# Patient Record
Sex: Female | Born: 1940
Health system: Southern US, Community
[De-identification: ages and names within clinical notes are randomized; demographics above are authoritative.]

## PROBLEM LIST (undated history)

## (undated) DIAGNOSIS — E538 Deficiency of other specified B group vitamins: Secondary | ICD-10-CM

## (undated) DIAGNOSIS — N12 Tubulo-interstitial nephritis, not specified as acute or chronic: Secondary | ICD-10-CM

## (undated) DIAGNOSIS — D649 Anemia, unspecified: Secondary | ICD-10-CM

## (undated) DIAGNOSIS — I10 Essential (primary) hypertension: Secondary | ICD-10-CM

## (undated) DIAGNOSIS — Z800 Family history of malignant neoplasm of digestive organs: Secondary | ICD-10-CM

## (undated) DIAGNOSIS — R42 Dizziness and giddiness: Secondary | ICD-10-CM

## (undated) DIAGNOSIS — E039 Hypothyroidism, unspecified: Secondary | ICD-10-CM

## (undated) DIAGNOSIS — D446 Neoplasm of uncertain behavior of carotid body: Secondary | ICD-10-CM

## (undated) DIAGNOSIS — G4730 Sleep apnea, unspecified: Secondary | ICD-10-CM

## (undated) DIAGNOSIS — K625 Hemorrhage of anus and rectum: Secondary | ICD-10-CM

## (undated) HISTORY — PX: EYE SURGERY: SHX253

## (undated) HISTORY — DX: Hemorrhage of anus and rectum: K62.5

## (undated) HISTORY — DX: Family history of malignant neoplasm of digestive organs: Z80.0

## (undated) HISTORY — PX: VESICOVAGINAL FISTULA CLOSURE W/ TAH: SUR271

## (undated) HISTORY — PX: ABDOMINAL HYSTERECTOMY: SHX81

## (undated) HISTORY — PX: CATARACT EXTRACTION W/ INTRAOCULAR LENS  IMPLANT, BILATERAL: SHX1307

## (undated) HISTORY — DX: Tubulo-interstitial nephritis, not specified as acute or chronic: N12

## (undated) HISTORY — DX: Dizziness and giddiness: R42

---

## 1998-09-01 ENCOUNTER — Other Ambulatory Visit: Admission: RE | Admit: 1998-09-01 | Discharge: 1998-09-01 | Payer: Self-pay | Admitting: Gynecology

## 1999-08-02 ENCOUNTER — Encounter: Payer: Self-pay | Admitting: Emergency Medicine

## 1999-08-02 ENCOUNTER — Emergency Department (HOSPITAL_COMMUNITY): Admission: EM | Admit: 1999-08-02 | Discharge: 1999-08-02 | Payer: Self-pay | Admitting: Emergency Medicine

## 1999-09-23 ENCOUNTER — Other Ambulatory Visit: Admission: RE | Admit: 1999-09-23 | Discharge: 1999-09-23 | Payer: Self-pay | Admitting: Gynecology

## 2000-10-02 ENCOUNTER — Encounter: Admission: RE | Admit: 2000-10-02 | Discharge: 2000-10-02 | Payer: Self-pay | Admitting: Gynecology

## 2000-10-02 ENCOUNTER — Other Ambulatory Visit: Admission: RE | Admit: 2000-10-02 | Discharge: 2000-10-02 | Payer: Self-pay | Admitting: Gynecology

## 2000-10-02 ENCOUNTER — Encounter: Payer: Self-pay | Admitting: Gynecology

## 2001-10-15 ENCOUNTER — Other Ambulatory Visit: Admission: RE | Admit: 2001-10-15 | Discharge: 2001-10-15 | Payer: Self-pay | Admitting: Gynecology

## 2001-10-15 ENCOUNTER — Encounter: Admission: RE | Admit: 2001-10-15 | Discharge: 2001-10-15 | Payer: Self-pay | Admitting: Gynecology

## 2001-10-15 ENCOUNTER — Encounter: Payer: Self-pay | Admitting: Gynecology

## 2002-10-11 ENCOUNTER — Other Ambulatory Visit: Admission: RE | Admit: 2002-10-11 | Discharge: 2002-10-11 | Payer: Self-pay | Admitting: Gynecology

## 2002-10-23 ENCOUNTER — Encounter: Payer: Self-pay | Admitting: Gynecology

## 2002-10-23 ENCOUNTER — Encounter: Admission: RE | Admit: 2002-10-23 | Discharge: 2002-10-23 | Payer: Self-pay | Admitting: Gynecology

## 2003-10-03 ENCOUNTER — Other Ambulatory Visit: Admission: RE | Admit: 2003-10-03 | Discharge: 2003-10-03 | Payer: Self-pay | Admitting: Gynecology

## 2004-10-13 ENCOUNTER — Other Ambulatory Visit: Admission: RE | Admit: 2004-10-13 | Discharge: 2004-10-13 | Payer: Self-pay | Admitting: Gynecology

## 2004-10-19 ENCOUNTER — Ambulatory Visit (HOSPITAL_COMMUNITY): Admission: RE | Admit: 2004-10-19 | Discharge: 2004-10-19 | Payer: Self-pay | Admitting: Gynecology

## 2005-10-24 ENCOUNTER — Other Ambulatory Visit: Admission: RE | Admit: 2005-10-24 | Discharge: 2005-10-24 | Payer: Self-pay | Admitting: Gynecology

## 2006-01-12 ENCOUNTER — Ambulatory Visit (HOSPITAL_COMMUNITY): Admission: RE | Admit: 2006-01-12 | Discharge: 2006-01-12 | Payer: Self-pay | Admitting: Gynecology

## 2006-10-26 ENCOUNTER — Other Ambulatory Visit: Admission: RE | Admit: 2006-10-26 | Discharge: 2006-10-26 | Payer: Self-pay | Admitting: Gynecology

## 2007-02-01 ENCOUNTER — Ambulatory Visit (HOSPITAL_COMMUNITY): Admission: RE | Admit: 2007-02-01 | Discharge: 2007-02-01 | Payer: Self-pay | Admitting: Gynecology

## 2008-02-06 ENCOUNTER — Ambulatory Visit (HOSPITAL_COMMUNITY): Admission: RE | Admit: 2008-02-06 | Discharge: 2008-02-06 | Payer: Self-pay | Admitting: Gynecology

## 2009-02-09 ENCOUNTER — Ambulatory Visit (HOSPITAL_COMMUNITY): Admission: RE | Admit: 2009-02-09 | Discharge: 2009-02-09 | Payer: Self-pay | Admitting: Family Medicine

## 2009-04-25 ENCOUNTER — Emergency Department (HOSPITAL_COMMUNITY): Admission: EM | Admit: 2009-04-25 | Discharge: 2009-04-25 | Payer: Self-pay | Admitting: Emergency Medicine

## 2010-02-22 ENCOUNTER — Ambulatory Visit (HOSPITAL_COMMUNITY): Admission: RE | Admit: 2010-02-22 | Discharge: 2010-02-22 | Payer: Self-pay | Admitting: Family Medicine

## 2010-03-20 ENCOUNTER — Inpatient Hospital Stay (HOSPITAL_COMMUNITY): Admission: EM | Admit: 2010-03-20 | Discharge: 2010-03-22 | Payer: Self-pay | Admitting: Emergency Medicine

## 2010-09-19 HISTORY — PX: COLONOSCOPY: SHX174

## 2010-12-05 LAB — URINALYSIS, ROUTINE W REFLEX MICROSCOPIC
Bilirubin Urine: NEGATIVE
Glucose, UA: NEGATIVE mg/dL
Hgb urine dipstick: NEGATIVE
Nitrite: NEGATIVE
Protein, ur: NEGATIVE mg/dL
Specific Gravity, Urine: 1.01 (ref 1.005–1.030)
Urobilinogen, UA: 0.2 mg/dL (ref 0.0–1.0)

## 2010-12-05 LAB — BASIC METABOLIC PANEL
BUN: 10 mg/dL (ref 6–23)
BUN: 12 mg/dL (ref 6–23)
Calcium: 8.5 mg/dL (ref 8.4–10.5)
GFR calc Af Amer: >60 mL/min (ref >60)
GFR calc non Af Amer: 48 mL/min — ABNORMAL LOW (ref >60)
GFR calc non Af Amer: 51 mL/min — ABNORMAL LOW (ref >60)
Glucose, Bld: 92 mg/dL (ref 70–99)
Potassium: 3.1 mEq/L — ABNORMAL LOW (ref 3.5–5.1)
Potassium: 3.4 mEq/L — ABNORMAL LOW (ref 3.5–5.1)

## 2010-12-05 LAB — DIFFERENTIAL
Basophils Absolute: 0 10*3/uL (ref 0.0–0.1)
Basophils Relative: 0 % (ref 0–1)
Eosinophils Absolute: 0.1 10*3/uL (ref 0.0–0.7)
Eosinophils Relative: 1 % (ref 0–5)
Neutrophils Relative %: 66 % (ref 43–77)

## 2010-12-05 LAB — CBC
HCT: 40.9 % (ref 36.0–46.0)
Platelets: 259 10*3/uL (ref 150–400)
WBC: 5.7 10*3/uL (ref 4.0–10.5)

## 2010-12-05 LAB — POCT CARDIAC MARKERS
CKMB, poc: <1 ng/mL — ABNORMAL LOW (ref 1.0–8.0)
Myoglobin, poc: 69.4 ng/mL (ref 12–200)

## 2010-12-26 LAB — URINALYSIS, ROUTINE W REFLEX MICROSCOPIC
Ketones, ur: 15 mg/dL — AB
Nitrite: POSITIVE — AB
Urobilinogen, UA: 4 mg/dL — ABNORMAL HIGH (ref 0.0–1.0)
pH: 6 (ref 5.0–8.0)

## 2010-12-26 LAB — POCT URINALYSIS DIP (DEVICE)
Glucose, UA: NEGATIVE mg/dL
pH: 6 (ref 5.0–8.0)

## 2010-12-26 LAB — URINE MICROSCOPIC-ADD ON

## 2010-12-26 LAB — COMPREHENSIVE METABOLIC PANEL
Alkaline Phosphatase: 76 U/L (ref 39–117)
Chloride: 99 mEq/L (ref 96–112)
Glucose, Bld: 94 mg/dL (ref 70–99)
Potassium: 2.8 mEq/L — ABNORMAL LOW (ref 3.5–5.1)
Sodium: 140 mEq/L (ref 135–145)
Total Bilirubin: 1.3 mg/dL — ABNORMAL HIGH (ref 0.3–1.2)
Total Protein: 7.6 g/dL (ref 6.0–8.3)

## 2010-12-26 LAB — CBC
MCHC: 33.8 g/dL (ref 30.0–36.0)
MCV: 87.3 fL (ref 78.0–100.0)
RBC: 4.46 MIL/uL (ref 3.87–5.11)
RDW: 14.5 % (ref 11.5–15.5)

## 2010-12-26 LAB — URINE CULTURE: Colony Count: >=100000

## 2010-12-26 LAB — DIFFERENTIAL
Basophils Absolute: 0 10*3/uL (ref 0.0–0.1)
Lymphs Abs: 0.5 10*3/uL — ABNORMAL LOW (ref 0.7–4.0)

## 2011-02-07 ENCOUNTER — Other Ambulatory Visit (HOSPITAL_COMMUNITY): Payer: Self-pay | Admitting: Gynecology

## 2011-02-07 DIAGNOSIS — Z1231 Encounter for screening mammogram for malignant neoplasm of breast: Secondary | ICD-10-CM

## 2011-02-25 ENCOUNTER — Ambulatory Visit (HOSPITAL_COMMUNITY)
Admission: RE | Admit: 2011-02-25 | Discharge: 2011-02-25 | Disposition: A | Discharge status: Home / Self Care | Payer: Medicare Other | Source: Ambulatory Visit | Attending: Gynecology | Admitting: Gynecology

## 2011-02-25 DIAGNOSIS — Z1231 Encounter for screening mammogram for malignant neoplasm of breast: Secondary | ICD-10-CM

## 2011-06-14 ENCOUNTER — Other Ambulatory Visit: Payer: Self-pay | Admitting: Internal Medicine

## 2011-06-14 ENCOUNTER — Ambulatory Visit
Admission: RE | Admit: 2011-06-14 | Discharge: 2011-06-14 | Disposition: A | Discharge status: Home / Self Care | Payer: Medicare Other | Source: Ambulatory Visit | Attending: Internal Medicine | Admitting: Internal Medicine

## 2011-06-14 DIAGNOSIS — R062 Wheezing: Secondary | ICD-10-CM

## 2011-09-26 DIAGNOSIS — N390 Urinary tract infection, site not specified: Secondary | ICD-10-CM | POA: Diagnosis not present

## 2011-11-05 DIAGNOSIS — H1030 Unspecified acute conjunctivitis, unspecified eye: Secondary | ICD-10-CM | POA: Diagnosis not present

## 2011-11-25 DIAGNOSIS — H04129 Dry eye syndrome of unspecified lacrimal gland: Secondary | ICD-10-CM | POA: Diagnosis not present

## 2011-11-25 DIAGNOSIS — H2510 Age-related nuclear cataract, unspecified eye: Secondary | ICD-10-CM | POA: Diagnosis not present

## 2012-01-21 DIAGNOSIS — S92919A Unspecified fracture of unspecified toe(s), initial encounter for closed fracture: Secondary | ICD-10-CM | POA: Diagnosis not present

## 2012-02-03 DIAGNOSIS — M949 Disorder of cartilage, unspecified: Secondary | ICD-10-CM | POA: Diagnosis not present

## 2012-02-03 DIAGNOSIS — M899 Disorder of bone, unspecified: Secondary | ICD-10-CM | POA: Diagnosis not present

## 2012-02-03 DIAGNOSIS — I10 Essential (primary) hypertension: Secondary | ICD-10-CM | POA: Diagnosis not present

## 2012-02-03 DIAGNOSIS — E039 Hypothyroidism, unspecified: Secondary | ICD-10-CM | POA: Diagnosis not present

## 2012-02-03 DIAGNOSIS — N8184 Pelvic muscle wasting: Secondary | ICD-10-CM | POA: Diagnosis not present

## 2012-04-06 ENCOUNTER — Other Ambulatory Visit (HOSPITAL_COMMUNITY): Payer: Self-pay | Admitting: Gynecology

## 2012-04-06 DIAGNOSIS — Z1231 Encounter for screening mammogram for malignant neoplasm of breast: Secondary | ICD-10-CM

## 2012-04-27 ENCOUNTER — Ambulatory Visit (HOSPITAL_COMMUNITY): Payer: Medicare Other

## 2012-05-04 ENCOUNTER — Ambulatory Visit (HOSPITAL_COMMUNITY)
Admission: RE | Admit: 2012-05-04 | Discharge: 2012-05-04 | Disposition: A | Discharge status: Home / Self Care | Payer: Medicare Other | Source: Ambulatory Visit | Attending: Gynecology | Admitting: Gynecology

## 2012-05-04 DIAGNOSIS — Z1231 Encounter for screening mammogram for malignant neoplasm of breast: Secondary | ICD-10-CM | POA: Diagnosis not present

## 2012-06-01 DIAGNOSIS — H2510 Age-related nuclear cataract, unspecified eye: Secondary | ICD-10-CM | POA: Diagnosis not present

## 2012-06-01 DIAGNOSIS — H3560 Retinal hemorrhage, unspecified eye: Secondary | ICD-10-CM | POA: Diagnosis not present

## 2012-06-06 DIAGNOSIS — I10 Essential (primary) hypertension: Secondary | ICD-10-CM | POA: Diagnosis not present

## 2012-07-10 DIAGNOSIS — Z23 Encounter for immunization: Secondary | ICD-10-CM | POA: Diagnosis not present

## 2012-07-13 DIAGNOSIS — E039 Hypothyroidism, unspecified: Secondary | ICD-10-CM | POA: Diagnosis not present

## 2012-07-13 DIAGNOSIS — I10 Essential (primary) hypertension: Secondary | ICD-10-CM | POA: Diagnosis not present

## 2012-07-13 DIAGNOSIS — E781 Pure hyperglyceridemia: Secondary | ICD-10-CM | POA: Diagnosis not present

## 2012-07-31 ENCOUNTER — Ambulatory Visit
Admission: RE | Admit: 2012-07-31 | Discharge: 2012-07-31 | Disposition: A | Discharge status: Home / Self Care | Payer: Medicare Other | Source: Ambulatory Visit | Attending: Nurse Practitioner | Admitting: Nurse Practitioner

## 2012-07-31 ENCOUNTER — Other Ambulatory Visit: Payer: Self-pay | Admitting: Nurse Practitioner

## 2012-07-31 DIAGNOSIS — M25569 Pain in unspecified knee: Secondary | ICD-10-CM | POA: Diagnosis not present

## 2012-07-31 DIAGNOSIS — M1710 Unilateral primary osteoarthritis, unspecified knee: Secondary | ICD-10-CM | POA: Diagnosis not present

## 2012-07-31 DIAGNOSIS — M25561 Pain in right knee: Secondary | ICD-10-CM

## 2012-07-31 DIAGNOSIS — R609 Edema, unspecified: Secondary | ICD-10-CM

## 2012-09-19 HISTORY — PX: UPPER GASTROINTESTINAL ENDOSCOPY: SHX188

## 2013-01-11 DIAGNOSIS — J45909 Unspecified asthma, uncomplicated: Secondary | ICD-10-CM | POA: Diagnosis not present

## 2013-02-01 ENCOUNTER — Emergency Department (HOSPITAL_COMMUNITY): Payer: Medicare Other

## 2013-02-01 ENCOUNTER — Encounter (HOSPITAL_COMMUNITY): Payer: Self-pay | Admitting: *Deleted

## 2013-02-01 ENCOUNTER — Inpatient Hospital Stay (HOSPITAL_COMMUNITY)
Admission: EM | Admit: 2013-02-01 | Discharge: 2013-02-05 | DRG: 690 | Disposition: A | Discharge status: Home / Self Care | Payer: Medicare Other | Attending: Pulmonary Disease | Admitting: Pulmonary Disease

## 2013-02-01 DIAGNOSIS — R059 Cough, unspecified: Secondary | ICD-10-CM | POA: Diagnosis not present

## 2013-02-01 DIAGNOSIS — E876 Hypokalemia: Secondary | ICD-10-CM | POA: Diagnosis present

## 2013-02-01 DIAGNOSIS — I10 Essential (primary) hypertension: Secondary | ICD-10-CM | POA: Diagnosis not present

## 2013-02-01 DIAGNOSIS — D509 Iron deficiency anemia, unspecified: Secondary | ICD-10-CM | POA: Diagnosis present

## 2013-02-01 DIAGNOSIS — E860 Dehydration: Secondary | ICD-10-CM

## 2013-02-01 DIAGNOSIS — D649 Anemia, unspecified: Secondary | ICD-10-CM | POA: Diagnosis not present

## 2013-02-01 DIAGNOSIS — R509 Fever, unspecified: Secondary | ICD-10-CM | POA: Diagnosis not present

## 2013-02-01 DIAGNOSIS — A498 Other bacterial infections of unspecified site: Secondary | ICD-10-CM | POA: Diagnosis present

## 2013-02-01 DIAGNOSIS — D631 Anemia in chronic kidney disease: Secondary | ICD-10-CM | POA: Diagnosis present

## 2013-02-01 DIAGNOSIS — E538 Deficiency of other specified B group vitamins: Secondary | ICD-10-CM | POA: Diagnosis present

## 2013-02-01 DIAGNOSIS — K449 Diaphragmatic hernia without obstruction or gangrene: Secondary | ICD-10-CM | POA: Diagnosis not present

## 2013-02-01 DIAGNOSIS — E039 Hypothyroidism, unspecified: Secondary | ICD-10-CM

## 2013-02-01 DIAGNOSIS — N318 Other neuromuscular dysfunction of bladder: Secondary | ICD-10-CM | POA: Diagnosis present

## 2013-02-01 DIAGNOSIS — N12 Tubulo-interstitial nephritis, not specified as acute or chronic: Principal | ICD-10-CM

## 2013-02-01 DIAGNOSIS — N10 Acute tubulo-interstitial nephritis: Secondary | ICD-10-CM | POA: Diagnosis not present

## 2013-02-01 DIAGNOSIS — R05 Cough: Secondary | ICD-10-CM | POA: Diagnosis not present

## 2013-02-01 DIAGNOSIS — K7689 Other specified diseases of liver: Secondary | ICD-10-CM | POA: Diagnosis not present

## 2013-02-01 DIAGNOSIS — N189 Chronic kidney disease, unspecified: Secondary | ICD-10-CM | POA: Diagnosis present

## 2013-02-01 DIAGNOSIS — R112 Nausea with vomiting, unspecified: Secondary | ICD-10-CM | POA: Diagnosis not present

## 2013-02-01 HISTORY — DX: Essential (primary) hypertension: I10

## 2013-02-01 HISTORY — DX: Hypothyroidism, unspecified: E03.9

## 2013-02-01 LAB — URINALYSIS, ROUTINE W REFLEX MICROSCOPIC
Glucose, UA: NEGATIVE mg/dL
Ketones, ur: 40 mg/dL — AB
Nitrite: POSITIVE — AB
Protein, ur: 30 mg/dL — AB
pH: 6 (ref 5.0–8.0)

## 2013-02-01 LAB — CBC WITH DIFFERENTIAL/PLATELET
Basophils Absolute: 0 10*3/uL (ref 0.0–0.1)
Eosinophils Absolute: 0 10*3/uL (ref 0.0–0.7)
Eosinophils Relative: 0 % (ref 0–5)
Lymphocytes Relative: 6 % — ABNORMAL LOW (ref 12–46)
Lymphs Abs: 0.6 10*3/uL — ABNORMAL LOW (ref 0.7–4.0)
MCV: 90.1 fL (ref 78.0–100.0)
Neutrophils Relative %: 90 % — ABNORMAL HIGH (ref 43–77)
Platelets: 217 10*3/uL (ref 150–400)
RBC: 3.85 MIL/uL — ABNORMAL LOW (ref 3.87–5.11)
RDW: 14.7 % (ref 11.5–15.5)
WBC: 9.3 10*3/uL (ref 4.0–10.5)

## 2013-02-01 LAB — LACTIC ACID, PLASMA: Lactic Acid, Venous: 1.8 mmol/L (ref 0.5–2.2)

## 2013-02-01 LAB — COMPREHENSIVE METABOLIC PANEL
Alkaline Phosphatase: 73 U/L (ref 39–117)
BUN: 13 mg/dL (ref 6–23)
CO2: 24 mEq/L (ref 19–32)
Chloride: 95 mEq/L — ABNORMAL LOW (ref 96–112)
Creatinine, Ser: 1.13 mg/dL — ABNORMAL HIGH (ref 0.50–1.10)
GFR calc non Af Amer: 47 mL/min — ABNORMAL LOW (ref >90)
Glucose, Bld: 140 mg/dL — ABNORMAL HIGH (ref 70–99)
Potassium: 3.3 mEq/L — ABNORMAL LOW (ref 3.5–5.1)
Total Bilirubin: 1.1 mg/dL (ref 0.3–1.2)

## 2013-02-01 LAB — URINE MICROSCOPIC-ADD ON

## 2013-02-01 MED ORDER — OLMESARTAN MEDOXOMIL-HCTZ 40-12.5 MG PO TABS: 1.0000 | ORAL_TABLET | Freq: Every day | ORAL | Status: DC

## 2013-02-01 MED ORDER — POTASSIUM CHLORIDE IN NACL 20-0.9 MEQ/L-% IV SOLN
INTRAVENOUS | Status: DC
  Administered 2013-02-02 – 2013-02-03 (×4): via INTRAVENOUS

## 2013-02-01 MED ORDER — SODIUM CHLORIDE 0.9 % IV SOLN
INTRAVENOUS | Status: AC
  Administered 2013-02-01: 23:00:00 via INTRAVENOUS

## 2013-02-01 MED ORDER — LEVOTHYROXINE SODIUM 75 MCG PO TABS
75.0000 ug | ORAL_TABLET | Freq: Every day | ORAL | Status: DC
  Administered 2013-02-02 – 2013-02-05 (×4): 75 ug via ORAL
  Filled 2013-02-01 (×5): qty 1

## 2013-02-01 MED ORDER — IOHEXOL 300 MG/ML  SOLN
100.0000 mL | Freq: Once | INTRAMUSCULAR | Status: AC | PRN
  Administered 2013-02-01: 100 mL via INTRAVENOUS

## 2013-02-01 MED ORDER — SODIUM CHLORIDE 0.9 % IV BOLUS (SEPSIS)
1000.0000 mL | Freq: Once | INTRAVENOUS | Status: AC
  Administered 2013-02-01: 1000 mL via INTRAVENOUS
  Filled 2013-02-01: qty 1000

## 2013-02-01 MED ORDER — FLEET ENEMA 7-19 GM/118ML RE ENEM: 1.0000 | ENEMA | Freq: Once | RECTAL | Status: AC | PRN

## 2013-02-01 MED ORDER — KETOROLAC TROMETHAMINE 30 MG/ML IJ SOLN
30.0000 mg | Freq: Once | INTRAMUSCULAR | Status: DC
  Filled 2013-02-01: qty 1

## 2013-02-01 MED ORDER — ONDANSETRON HCL 4 MG/2ML IJ SOLN
4.0000 mg | Freq: Once | INTRAMUSCULAR | Status: AC
  Administered 2013-02-01: 4 mg via INTRAVENOUS
  Filled 2013-02-01: qty 2

## 2013-02-01 MED ORDER — IOHEXOL 300 MG/ML  SOLN
50.0000 mL | Freq: Once | INTRAMUSCULAR | Status: AC | PRN
  Administered 2013-02-01: 50 mL via ORAL

## 2013-02-01 MED ORDER — ACETAMINOPHEN 10 MG/ML IV SOLN
1000.0000 mg | Freq: Four times a day (QID) | INTRAVENOUS | Status: DC
  Administered 2013-02-01: 1000 mg via INTRAVENOUS
  Filled 2013-02-01 (×4): qty 100

## 2013-02-01 MED ORDER — SODIUM CHLORIDE 0.9 % IV BOLUS (SEPSIS)
1000.0000 mL | Freq: Once | INTRAVENOUS | Status: AC
  Administered 2013-02-01: 1000 mL via INTRAVENOUS

## 2013-02-01 MED ORDER — HYDROMORPHONE HCL PF 1 MG/ML IJ SOLN
0.5000 mg | INTRAMUSCULAR | Status: DC | PRN
  Filled 2013-02-01: qty 1

## 2013-02-01 MED ORDER — IRBESARTAN 300 MG PO TABS
300.0000 mg | ORAL_TABLET | Freq: Every day | ORAL | Status: DC
  Administered 2013-02-02 – 2013-02-04 (×3): 300 mg via ORAL
  Filled 2013-02-01 (×3): qty 1

## 2013-02-01 MED ORDER — ACETAMINOPHEN 325 MG PO TABS
ORAL_TABLET | ORAL | Status: AC
  Administered 2013-02-01: 650 mg
  Filled 2013-02-01: qty 2

## 2013-02-01 MED ORDER — ESTRADIOL 0.0375 MG/24HR TD PTWK
0.0375 mg | MEDICATED_PATCH | TRANSDERMAL | Status: DC
  Filled 2013-02-01: qty 1

## 2013-02-01 MED ORDER — DEXTROSE 5 % IV SOLN
1.0000 g | INTRAVENOUS | Status: DC
  Administered 2013-02-02 – 2013-02-04 (×3): 1 g via INTRAVENOUS
  Filled 2013-02-01 (×4): qty 10

## 2013-02-01 MED ORDER — KETOROLAC TROMETHAMINE 30 MG/ML IJ SOLN
30.0000 mg | Freq: Once | INTRAMUSCULAR | Status: AC
  Administered 2013-02-01: 30 mg via INTRAVENOUS

## 2013-02-01 MED ORDER — POLYETHYLENE GLYCOL 3350 17 G PO PACK
17.0000 g | PACK | Freq: Every day | ORAL | Status: DC | PRN
  Filled 2013-02-01: qty 1

## 2013-02-01 MED ORDER — TRAZODONE HCL 50 MG PO TABS
25.0000 mg | ORAL_TABLET | Freq: Every evening | ORAL | Status: DC | PRN
  Administered 2013-02-03 – 2013-02-04 (×2): 25 mg via ORAL
  Filled 2013-02-01 (×2): qty 1

## 2013-02-01 MED ORDER — ACETAMINOPHEN 325 MG PO TABS
650.0000 mg | ORAL_TABLET | ORAL | Status: DC | PRN
  Administered 2013-02-02: 650 mg via ORAL
  Filled 2013-02-01 (×2): qty 2

## 2013-02-01 MED ORDER — FENTANYL CITRATE 0.05 MG/ML IJ SOLN
50.0000 ug | Freq: Once | INTRAMUSCULAR | Status: AC
  Administered 2013-02-01: 50 ug via INTRAVENOUS
  Filled 2013-02-01: qty 2

## 2013-02-01 MED ORDER — DEXTROSE 5 % IV SOLN
1.0000 g | Freq: Once | INTRAVENOUS | Status: AC
  Administered 2013-02-01: 1 g via INTRAVENOUS
  Filled 2013-02-01: qty 10

## 2013-02-01 MED ORDER — ASPIRIN EC 81 MG PO TBEC
81.0000 mg | DELAYED_RELEASE_TABLET | Freq: Every day | ORAL | Status: DC
  Administered 2013-02-02 – 2013-02-04 (×3): 81 mg via ORAL
  Filled 2013-02-01 (×3): qty 1

## 2013-02-01 MED ORDER — ONDANSETRON HCL 4 MG/2ML IJ SOLN
4.0000 mg | INTRAMUSCULAR | Status: DC | PRN
  Administered 2013-02-02: 4 mg via INTRAVENOUS
  Filled 2013-02-01: qty 2

## 2013-02-01 MED ORDER — HYDROCHLOROTHIAZIDE 12.5 MG PO CAPS
12.5000 mg | ORAL_CAPSULE | Freq: Every day | ORAL | Status: DC
  Administered 2013-02-02 – 2013-02-04 (×3): 12.5 mg via ORAL
  Filled 2013-02-01 (×3): qty 1

## 2013-02-01 MED ORDER — ENOXAPARIN SODIUM 30 MG/0.3ML ~~LOC~~ SOLN
30.0000 mg | SUBCUTANEOUS | Status: DC
  Administered 2013-02-02 – 2013-02-04 (×3): 30 mg via SUBCUTANEOUS
  Filled 2013-02-01 (×3): qty 0.3

## 2013-02-01 NOTE — ED Notes (Addendum)
Pt temp unchaged to this point, will notifty EDP, pt is aox4, states she feels better. No other complaints. Cold salene still in process, temp in room is down and pt is uncovered.

## 2013-02-01 NOTE — ED Notes (Signed)
abd pain, back pain, headache, fever,vomiting , diarrhea.

## 2013-02-01 NOTE — ED Provider Notes (Addendum)
History    This chart was scribed for Rawn Quiroa B. Bernette Mayers, MD by Toya Smothers, ED Scribe. The patient was seen in room APA01/APA01. Patient's care was started at 1205.    CSN: [id number]  Arrival date & time 02/01/13  1205   None     Chief Complaint  Patient presents with  . Abdominal Pain    The history is provided by the patient. No language interpreter was used.    HPI Comments: Heather Cobb is a 72 y.o. female who presents to the Emergency Department complaining of 2 days of progressive lower abdominal and back pain. Pain is moderate and worse on the right. There are no alleviating or aggravating factors. Pt endorses associated moderate nausea, emesis, diarrhea, mild headache, and subjective fever. Symptoms have not been treated PTA. Pt denies diaphoresis, chills, weakness, cough, SOB and any other pain. Pt denies use of tobacco, alcohol, and illicit drug use.  PCP: Dr Juanetta Gosling      History reviewed. No pertinent past medical history.  Past Surgical History  Procedure Laterality Date  . Abdominal hysterectomy      History reviewed. No pertinent family history.  History  Substance Use Topics  . Smoking status: Never Smoker   . Smokeless tobacco: Not on file  . Alcohol Use: No   Review of Systems  Constitutional: Positive for fever.  Gastrointestinal: Positive for nausea, vomiting, abdominal pain and diarrhea.  Musculoskeletal: Positive for back pain.  Neurological: Positive for headaches.  All other systems reviewed and are negative.    Allergies  Codeine  Home Medications   Current Outpatient Rx  Name  Route  Sig  Dispense  Refill  . aspirin EC 81 MG tablet   Oral   Take 81 mg by mouth daily.         . cholecalciferol (VITAMIN D) 1000 UNITS tablet   Oral   Take 1,000 Units by mouth daily.         Marland Kitchen estradiol (CLIMARA - DOSED IN MG/24 HR) 0.0375 mg/24hr   Transdermal   Place 1 patch onto the skin once a week.         . levothyroxine  (SYNTHROID, LEVOTHROID) 75 MCG tablet   Oral   Take 75 mcg by mouth daily.         Marland Kitchen olmesartan-hydrochlorothiazide (BENICAR HCT) 40-12.5 MG per tablet   Oral   Take 1 tablet by mouth daily.           BP 120/71  Pulse 110  Temp(Src) 101 F (38.3 C) (Oral)  Resp 20  Ht 5\' 4"  (1.626 m)  Wt 161 lb (73.029 kg)  BMI 27.62 kg/m2  SpO2 98%  Physical Exam  Nursing note and vitals reviewed. Constitutional: She is oriented to person, place, and time. She appears well-developed and well-nourished.  HENT:  Head: Normocephalic and atraumatic.  Eyes: EOM are normal. Pupils are equal, round, and reactive to light.  Neck: Normal range of motion. Neck supple.  Cardiovascular: Normal rate, normal heart sounds and intact distal pulses.   Pulmonary/Chest: Effort normal and breath sounds normal.  Abdominal: Bowel sounds are normal. She exhibits no distension. There is no guarding.  Tender RLQ  Musculoskeletal: Normal range of motion. She exhibits no edema and no tenderness.  Neurological: She is alert and oriented to person, place, and time. She has normal strength. No cranial nerve deficit or sensory deficit.  Skin: Skin is warm and dry. No rash noted.  Psychiatric: She has a  normal mood and affect.    ED Course  Procedures DIAGNOSTIC STUDIES: Oxygen Saturation is 98% on room air, normal by my interpretation.    COORDINATION OF CARE: 15:33- Evaluated Pt. Pt is awake, alert, and without distress. 15:37- Patient understand and agree with initial ED impression and plan with expectations set for ED visit. Offered pain medication, Pt denied. 15:47- Ordered DG Chest 2 View 1 time imaging.    Labs Reviewed  COMPREHENSIVE METABOLIC PANEL - Abnormal; Notable for the following:    Sodium 132 (*)    Potassium 3.3 (*)    Chloride 95 (*)    Glucose, Bld 140 (*)    Creatinine, Ser 1.13 (*)    GFR calc non Af Amer 47 (*)    GFR calc Af Amer 55 (*)    All other components within normal  limits  CBC WITH DIFFERENTIAL - Abnormal; Notable for the following:    RBC 3.85 (*)    Hemoglobin 11.6 (*)    HCT 34.7 (*)    Neutrophils Relative % 90 (*)    Neutro Abs 8.3 (*)    Lymphocytes Relative 6 (*)    Lymphs Abs 0.6 (*)    All other components within normal limits  URINALYSIS, ROUTINE W REFLEX MICROSCOPIC - Abnormal; Notable for the following:    Hgb urine dipstick LARGE (*)    Bilirubin Urine SMALL (*)    Ketones, ur 40 (*)    Protein, ur 30 (*)    Urobilinogen, UA 4.0 (*)    Nitrite POSITIVE (*)    Leukocytes, UA SMALL (*)    All other components within normal limits  URINE MICROSCOPIC-ADD ON - Abnormal; Notable for the following:    Squamous Epithelial / LPF FEW (*)    Bacteria, UA MANY (*)    All other components within normal limits  URINE CULTURE  LACTIC ACID, PLASMA   Dg Chest 2 View  02/01/2013   *RADIOLOGY REPORT*  Clinical Data: Fever and cough.  CHEST - 2 VIEW  Comparison: 06/14/2011  Findings: Lungs are hypoinflated without focal consolidation or effusion.  There is mild cardiomegaly unchanged.  Remainder of the exam is unchanged.  IMPRESSION: Hypoinflation without acute cardiopulmonary disease.  Mild stable cardiomegaly.   Original Report Authenticated By: Elberta Fortis, M.D.   Ct Abdomen Pelvis W Contrast  02/01/2013   *RADIOLOGY REPORT*  Clinical Data: Fever, fever and right lower quadrant tenderness. Evaluate appendix.  CT ABDOMEN AND PELVIS WITH CONTRAST  Technique:  Multidetector CT imaging of the abdomen and pelvis was performed following the standard protocol during bolus administration of intravenous contrast.  Contrast: 50mL OMNIPAQUE IOHEXOL 300 MG/ML  SOLN, OMNIPAQUE IOHEXOL 300 MG/ML  SOLN  Comparison: CT abdomen pelvis 04/25/2009  Findings: Stable linear scarring in the right lower lobe.  Heart size is borderline enlarged, but incompletely visualized.  There is a small hiatal hernia.  Negative for pleural effusion.  Multiple circumscribed water  density lesions in the liver are stable compared to a CT abdomen pelvis of 2010 and consistent with benign hepatic cysts.  There is a low density lesion with peripheral nodular enhancement that fills in on the delayed images in the left lobe, consistent with a benign hemangioma.  The portal vein is patent.  The spleen, adrenal glands, pancreas, gallbladder, and left kidney are within normal limits.  There are patchy non mass-like areas of decreased attenuation in the right kidney, centered in the mid aspect of the kidney.  The right  renal contour is within normal limits and stable.  There is no hydronephrosis on the right.  Both ureters are normal in caliber. There is suggestion of faint urothelial enhancement of the proximal right ureter No ureteral stones are visualized.  No perinephric fluid collection.  Minimal haziness in the perinephric fat on the right.  Urinary bladder is not very distended at the time imaging, but demonstrates probable mild urinary bladder wall thickening, measuring up to 5 mm, with some indistinctness of the perivesicular fat anteriorly.  Terminal ileum is normal.  The cecum appears normal.  A definite appendix is not seen.  There are no right abdominal inflammatory changes near the cecum to suggest acute appendicitis.  Hysterectomy.  No adnexal mass, lymphadenopathy, or ascites.  IMPRESSION: 1. CT findings suggest a urinary tract infection.  Findings compatible with pyelonephritis of the right kidney. Mild urinary bladder wall thickening suggests cystitis in the appropriate clinical setting, and there is mild urothelial enhancement of the proximal right ureter, also seen in the setting of urinary tract infection.  Suggest correlation with clinical symptoms. 2.  Multiple hepatic cysts, stable. 3.  Small hiatal hernia. 4.  Nonvisualization of the appendix. 5.  Hysterectomy.   Original Report Authenticated By: Britta Mccreedy, M.D.     1. Pyelonephritis       MDM  UA clearly shows  infection, CT negative for appendicitis, but shows findings of pyelonephritis. Pt feeling better with fluids and antiemetics. No signs of sepsis. Given rocephin and plan admission for further treatment.   \ 7:51 PM Pt has had spike in temp, also now more tachycardic. Suspect this is due to bacteriolysis from Abx. Given toradol IV as antipyretic, additional IVF.   I personally performed the services described in this documentation, which was scribed in my presence. The recorded information has been reviewed and is accurate.      Anahy Esh B. Bernette Mayers, MD 02/01/13 1908  Bonnita Levan. Bernette Mayers, MD 02/02/13 [phone]

## 2013-02-01 NOTE — ED Notes (Signed)
Have paged Dr Orvan Falconer 2 times for this pt

## 2013-02-01 NOTE — ED Notes (Signed)
Will wait for orders from dr Bernette Mayers prior to transporting pt upstairs to room .

## 2013-02-01 NOTE — H&P (Signed)
[facility] History and Physical  Heather Cobb  UJW:[id number]  DOB: 10-16-1940   DOA: 02/01/2013   PCP:   Fredirick Maudlin, MD   Chief Complaint:   back pain for 2 days  HPI: Heather Cobb is an 72 y.o. female.  Elderly African American lady had one episode of vomiting, and one episode of diarrhea associated with low back pain and chills starting 2 days ago, and felt she had a viral syndrome. She has continued to feel sick and eventually came to the emergency room for further. The back pain is mostly to the right and is associated and has been associated with increased frequency of urine. She has a history of stress incontinence and this has not changed. He denies hematuria.  In the emergency room patient was noted to markedly febrile to 105, she received IV fluids and pain medications and the CT scan of the abdomen and pelvis was done.  Rewiew of Systems:   All systems negative except as marked bold or noted in the HPI;  Constitutional:    malaise, fever and chills. ;  Eyes:   eye pain, redness and discharge. ;  ENMT:   ear pain, hoarseness, nasal congestion, sinus pressure and sore throat. ;  Cardiovascular:    chest pain, palpitations, diaphoresis, dyspnea and peripheral edema.  Respiratory:   cough, hemoptysis, wheezing and stridor. ;  Gastrointestinal:  nausea, vomiting, diarrhea, constipation, abdominal pain, melena, blood in stool, hematemesis, jaundice and rectal bleeding. unusual weight loss..   Genitourinary:    frequency, dysuria, stress incontinence,flank pain and hematuria; Musculoskeletal:   back pain and neck pain.  swelling and trauma.;  Skin: .  pruritus, rash, abrasions, bruising and skin lesion.; ulcerations Neuro:    headache, lightheadedness and neck stiffness.  weakness, altered level of consciousness, altered mental status, extremity weakness, burning feet, involuntary movement, seizure and syncope.  Psych:    anxiety, depression, insomnia, tearfulness,  panic attacks, hallucinations, paranoia, suicidal or homicidal ideation    Past Medical History  Diagnosis Date  . Hypertension   . Hypothyroid     Past Surgical History  Procedure Laterality Date  . Abdominal hysterectomy      Medications:  HOME MEDS: Prior to Admission medications   Medication Sig Start Date End Date Taking? Authorizing Provider  aspirin EC 81 MG tablet Take 81 mg by mouth daily.   Yes Historical Provider, MD  cholecalciferol (VITAMIN D) 1000 UNITS tablet Take 1,000 Units by mouth daily.   Yes Historical Provider, MD  estradiol (CLIMARA - DOSED IN MG/24 HR) 0.0375 mg/24hr Place 1 patch onto the skin once a week.   Yes Historical Provider, MD  levothyroxine (SYNTHROID, LEVOTHROID) 75 MCG tablet Take 75 mcg by mouth daily.   Yes Historical Provider, MD  olmesartan-hydrochlorothiazide (BENICAR HCT) 40-12.5 MG per tablet Take 1 tablet by mouth daily.   Yes Historical Provider, MD     Allergies:  Allergies  Allergen Reactions  . Codeine Nausea And Vomiting    Social History:   reports that she has never smoked. She does not have any smokeless tobacco history on file. She reports that she does not drink alcohol or use illicit drugs.  Family History: Family History  Problem Relation Age of Onset  . Asthma Mother   . Hypertension Daughter      Physical Exam: Filed Vitals:   02/01/13 2100 02/01/13 2106 02/01/13 2115 02/01/13 2153  BP: 130/54   124/67  Pulse: 116   110  Temp:  103.2 F (39.6 C)  101.1 F (38.4 C)  TempSrc:  Oral  Oral  Resp: 25   20  Height:    5\' 4"  (1.626 m)  Weight:    72.9 kg (160 lb 11.5 oz)  SpO2: 93%  95% 94%   Blood pressure 124/67, pulse 110, temperature 101.1 F (38.4 C), temperature source Oral, resp. rate 20, height 5\' 4"  (1.626 m), weight 72.9 kg (160 lb 11.5 oz), SpO2 94.00%.  GEN:  Pleasant elderly African American lady lying bed in no acute distress; looks younger than her stated age cooperative with exam PSYCH:   alert and oriented x4;  neither anxious or depressed; affect is appropriate. HEENT: Mucous membranes pink and anicteric; PERRLA; EOM intact; no cervical lymphadenopathy nor thyromegaly or carotid bruit; no JVD; Breasts:: Not examined CHEST WALL: No tenderness CHEST: Normal respiration, clear to auscultation bilaterally HEART: Regular rate and rhythm; 1/6 systolic murmur no rubs or gallops BACK: No kyphosis no scoliosis; no CVA tenderness ABDOMEN: Obese, soft non-tender; no masses, no organomegaly, normal abdominal bowel sounds;  no intertriginous candida. Rectal Exam: Not done EXTREMITIES:  age-appropriate arthropathy of the hands and knees; no edema; no ulcerations. Genitalia: not examined PULSES: 2+ and symmetric SKIN: Normal hydration no rash or ulceration CNS: Cranial nerves 2-12 grossly intact no focal lateralizing neurologic deficit   Labs on Admission:  Basic Metabolic Panel:  Recent Labs Lab 02/01/13 1321  NA 132*  K 3.3*  CL 95*  CO2 24  GLUCOSE 140*  BUN 13  CREATININE 1.13*  CALCIUM 9.2   Liver Function Tests:  Recent Labs Lab 02/01/13 1321  AST 16  ALT 11  ALKPHOS 73  BILITOT 1.1  PROT 7.6  ALBUMIN 3.6   No results found for this basename: LIPASE, AMYLASE,  in the last 168 hours No results found for this basename: AMMONIA,  in the last 168 hours CBC:  Recent Labs Lab 02/01/13 1321  WBC 9.3  NEUTROABS 8.3*  HGB 11.6*  HCT 34.7*  MCV 90.1  PLT 217   Cardiac Enzymes: No results found for this basename: CKTOTAL, CKMB, CKMBINDEX, TROPONINI,  in the last 168 hours BNP: No components found with this basename: POCBNP,  D-dimer: No components found with this basename: D-DIMER,  CBG: No results found for this basename: GLUCAP,  in the last 168 hours  Radiological Exams on Admission: Dg Chest 2 View  02/01/2013   *RADIOLOGY REPORT*  Clinical Data: Fever and cough.  CHEST - 2 VIEW  Comparison: 06/14/2011  Findings: Lungs are hypoinflated without  focal consolidation or effusion.  There is mild cardiomegaly unchanged.  Remainder of the exam is unchanged.  IMPRESSION: Hypoinflation without acute cardiopulmonary disease.  Mild stable cardiomegaly.   Original Report Authenticated By: Elberta Fortis, M.D.   Ct Abdomen Pelvis W Contrast  02/01/2013   *RADIOLOGY REPORT*  Clinical Data: Fever, fever and right lower quadrant tenderness. Evaluate appendix.  CT ABDOMEN AND PELVIS WITH CONTRAST  Technique:  Multidetector CT imaging of the abdomen and pelvis was performed following the standard protocol during bolus administration of intravenous contrast.  Contrast: 50mL OMNIPAQUE IOHEXOL 300 MG/ML  SOLN, OMNIPAQUE IOHEXOL 300 MG/ML  SOLN  Comparison: CT abdomen pelvis 04/25/2009  Findings: Stable linear scarring in the right lower lobe.  Heart size is borderline enlarged, but incompletely visualized.  There is a small hiatal hernia.  Negative for pleural effusion.  Multiple circumscribed water density lesions in the liver are stable compared to a CT abdomen pelvis  of 2010 and consistent with benign hepatic cysts.  There is a low density lesion with peripheral nodular enhancement that fills in on the delayed images in the left lobe, consistent with a benign hemangioma.  The portal vein is patent.  The spleen, adrenal glands, pancreas, gallbladder, and left kidney are within normal limits.  There are patchy non mass-like areas of decreased attenuation in the right kidney, centered in the mid aspect of the kidney.  The right renal contour is within normal limits and stable.  There is no hydronephrosis on the right.  Both ureters are normal in caliber. There is suggestion of faint urothelial enhancement of the proximal right ureter No ureteral stones are visualized.  No perinephric fluid collection.  Minimal haziness in the perinephric fat on the right.  Urinary bladder is not very distended at the time imaging, but demonstrates probable mild urinary bladder wall  thickening, measuring up to 5 mm, with some indistinctness of the perivesicular fat anteriorly.  Terminal ileum is normal.  The cecum appears normal.  A definite appendix is not seen.  There are no right abdominal inflammatory changes near the cecum to suggest acute appendicitis.  Hysterectomy.  No adnexal mass, lymphadenopathy, or ascites.  IMPRESSION: 1. CT findings suggest a urinary tract infection.  Findings compatible with pyelonephritis of the right kidney. Mild urinary bladder wall thickening suggests cystitis in the appropriate clinical setting, and there is mild urothelial enhancement of the proximal right ureter, also seen in the setting of urinary tract infection.  Suggest correlation with clinical symptoms. 2.  Multiple hepatic cysts, stable. 3.  Small hiatal hernia. 4.  Nonvisualization of the appendix. 5.  Hysterectomy.   Original Report Authenticated By: Britta Mccreedy, M.D.       Assessment/Plan Present on Admission:  . Pyelonephritis Hypokalemia Dehydration Hypothyroidism Hypertension  PLAN: Admit this lady to a medical bed for intravenous fluid hydration, he should have electrolytes, and antibiotic therapy pending results of urine cultures  Other plans as per orders.  Code Status: Full code, to be re-confirmed Family Communication: Husband and daughters at bedside Disposition Plan: Likely discharge her home in a day or 2   Virgilene Stryker Nocturnist [facility] Pager [phone]   02/01/2013, 11:30 PM

## 2013-02-01 NOTE — ED Notes (Signed)
Informed accepting RN of delay

## 2013-02-01 NOTE — ED Notes (Signed)
Dr Bernette Mayers aware of vitals, has ordered Toradol, and a cooled bolus. Blanket taken off pt and temp in room turned down, pt appears more comfortable at this time, breathing has slowed to normal.

## 2013-02-02 DIAGNOSIS — D649 Anemia, unspecified: Secondary | ICD-10-CM | POA: Diagnosis present

## 2013-02-02 DIAGNOSIS — I10 Essential (primary) hypertension: Secondary | ICD-10-CM | POA: Diagnosis present

## 2013-02-02 DIAGNOSIS — E039 Hypothyroidism, unspecified: Secondary | ICD-10-CM | POA: Diagnosis present

## 2013-02-02 DIAGNOSIS — E860 Dehydration: Secondary | ICD-10-CM | POA: Diagnosis present

## 2013-02-02 DIAGNOSIS — N10 Acute tubulo-interstitial nephritis: Secondary | ICD-10-CM | POA: Diagnosis not present

## 2013-02-02 DIAGNOSIS — D631 Anemia in chronic kidney disease: Secondary | ICD-10-CM | POA: Diagnosis present

## 2013-02-02 LAB — FERRITIN: Ferritin: 121 ng/mL (ref 10–291)

## 2013-02-02 LAB — CBC
MCH: 30 pg (ref 26.0–34.0)
MCHC: 32.7 g/dL (ref 30.0–36.0)
MCV: 91.9 fL (ref 78.0–100.0)
Platelets: 187 10*3/uL (ref 150–400)
RDW: 15 % (ref 11.5–15.5)

## 2013-02-02 LAB — BASIC METABOLIC PANEL
CO2: 25 mEq/L (ref 19–32)
Calcium: 7.9 mg/dL — ABNORMAL LOW (ref 8.4–10.5)
Creatinine, Ser: 1.43 mg/dL — ABNORMAL HIGH (ref 0.50–1.10)
Glucose, Bld: 110 mg/dL — ABNORMAL HIGH (ref 70–99)

## 2013-02-02 LAB — RETICULOCYTES
Retic Count, Absolute: 48.5 10*3/uL (ref 19.0–186.0)
Retic Ct Pct: 1.5 % (ref 0.4–3.1)

## 2013-02-02 MED ORDER — CIPROFLOXACIN IN D5W 400 MG/200ML IV SOLN
400.0000 mg | Freq: Two times a day (BID) | INTRAVENOUS | Status: DC
  Administered 2013-02-02 – 2013-02-04 (×6): 400 mg via INTRAVENOUS
  Filled 2013-02-02 (×9): qty 200

## 2013-02-02 NOTE — Progress Notes (Signed)
Subjective: She says she feels much better. She was admitted with pyelonephritis and what may also be cystitis. She had a remarkably high temperature.  Objective: Vital signs in last 24 hours: Temp:  [97.3 F (36.3 C)-105.3 F (40.7 C)] 97.6 F (36.4 C) (05/17 0457) Pulse Rate:  [69-116] 69 (05/17 0457) Resp:  [16-33] 18 (05/17 0457) BP: (102-159)/(54-71) 102/62 mmHg (05/17 0457) SpO2:  [90 %-100 %] 100 % (05/17 0457) Weight:  [72.9 kg (160 lb 11.5 oz)-73.1 kg (161 lb 2.5 oz)] 73.1 kg (161 lb 2.5 oz) (05/17 0457) Weight change:  Last BM Date: 01/31/13  Intake/Output from previous day: 05/16 0701 - 05/17 0700 In: 590 [I.V.:590] Out: -   PHYSICAL EXAM General appearance: alert, cooperative and no distress Resp: clear to auscultation bilaterally Cardio: regular rate and rhythm, S1, S2 normal, no murmur, click, rub or gallop GI: Her abdomen is soft but she still has some right flank tenderness Extremities: extremities normal, atraumatic, no cyanosis or edema  Lab Results:    Basic Metabolic Panel:  Recent Labs  04/54/09 1321 02/02/13 0714  NA 132* 139  K 3.3* 3.4*  CL 95* 103  CO2 24 25  GLUCOSE 140* 110*  BUN 13 18  CREATININE 1.13* 1.43*  CALCIUM 9.2 7.9*   Liver Function Tests:  Recent Labs  02/01/13 1321  AST 16  ALT 11  ALKPHOS 73  BILITOT 1.1  PROT 7.6  ALBUMIN 3.6   No results found for this basename: LIPASE, AMYLASE,  in the last 72 hours No results found for this basename: AMMONIA,  in the last 72 hours CBC:  Recent Labs  02/01/13 1321 02/02/13 0714  WBC 9.3 7.0  NEUTROABS 8.3*  --   HGB 11.6* 9.6*  HCT 34.7* 29.4*  MCV 90.1 91.9  PLT 217 187   Cardiac Enzymes: No results found for this basename: CKTOTAL, CKMB, CKMBINDEX, TROPONINI,  in the last 72 hours BNP: No results found for this basename: PROBNP,  in the last 72 hours D-Dimer: No results found for this basename: DDIMER,  in the last 72 hours CBG: No results found for this  basename: GLUCAP,  in the last 72 hours Hemoglobin A1C: No results found for this basename: HGBA1C,  in the last 72 hours Fasting Lipid Panel: No results found for this basename: CHOL, HDL, LDLCALC, TRIG, CHOLHDL, LDLDIRECT,  in the last 72 hours Thyroid Function Tests: No results found for this basename: TSH, T4TOTAL, FREET4, T3FREE, THYROIDAB,  in the last 72 hours Anemia Panel: No results found for this basename: VITAMINB12, FOLATE, FERRITIN, TIBC, IRON, RETICCTPCT,  in the last 72 hours Coagulation: No results found for this basename: LABPROT, INR,  in the last 72 hours Urine Drug Screen: Drugs of Abuse  No results found for this basename: labopia, cocainscrnur, labbenz, amphetmu, thcu, labbarb    Alcohol Level: No results found for this basename: ETH,  in the last 72 hours Urinalysis:  Recent Labs  02/01/13 1257  COLORURINE YELLOW  LABSPEC 1.025  PHURINE 6.0  GLUCOSEU NEGATIVE  HGBUR LARGE*  BILIRUBINUR SMALL*  KETONESUR 40*  PROTEINUR 30*  UROBILINOGEN 4.0*  NITRITE POSITIVE*  LEUKOCYTESUR SMALL*   Misc. Labs:  ABGS No results found for this basename: PHART, PCO2, PO2ART, TCO2, HCO3,  in the last 72 hours CULTURES No results found for this or any previous visit (from the past 240 hour(s)). Studies/Results: Dg Chest 2 View  02/01/2013   *RADIOLOGY REPORT*  Clinical Data: Fever and cough.  CHEST -  2 VIEW  Comparison: 06/14/2011  Findings: Lungs are hypoinflated without focal consolidation or effusion.  There is mild cardiomegaly unchanged.  Remainder of the exam is unchanged.  IMPRESSION: Hypoinflation without acute cardiopulmonary disease.  Mild stable cardiomegaly.   Original Report Authenticated By: Elberta Fortis, M.D.   Ct Abdomen Pelvis W Contrast  02/01/2013   *RADIOLOGY REPORT*  Clinical Data: Fever, fever and right lower quadrant tenderness. Evaluate appendix.  CT ABDOMEN AND PELVIS WITH CONTRAST  Technique:  Multidetector CT imaging of the abdomen and pelvis  was performed following the standard protocol during bolus administration of intravenous contrast.  Contrast: 50mL OMNIPAQUE IOHEXOL 300 MG/ML  SOLN, OMNIPAQUE IOHEXOL 300 MG/ML  SOLN  Comparison: CT abdomen pelvis 04/25/2009  Findings: Stable linear scarring in the right lower lobe.  Heart size is borderline enlarged, but incompletely visualized.  There is a small hiatal hernia.  Negative for pleural effusion.  Multiple circumscribed water density lesions in the liver are stable compared to a CT abdomen pelvis of 2010 and consistent with benign hepatic cysts.  There is a low density lesion with peripheral nodular enhancement that fills in on the delayed images in the left lobe, consistent with a benign hemangioma.  The portal vein is patent.  The spleen, adrenal glands, pancreas, gallbladder, and left kidney are within normal limits.  There are patchy non mass-like areas of decreased attenuation in the right kidney, centered in the mid aspect of the kidney.  The right renal contour is within normal limits and stable.  There is no hydronephrosis on the right.  Both ureters are normal in caliber. There is suggestion of faint urothelial enhancement of the proximal right ureter No ureteral stones are visualized.  No perinephric fluid collection.  Minimal haziness in the perinephric fat on the right.  Urinary bladder is not very distended at the time imaging, but demonstrates probable mild urinary bladder wall thickening, measuring up to 5 mm, with some indistinctness of the perivesicular fat anteriorly.  Terminal ileum is normal.  The cecum appears normal.  A definite appendix is not seen.  There are no right abdominal inflammatory changes near the cecum to suggest acute appendicitis.  Hysterectomy.  No adnexal mass, lymphadenopathy, or ascites.  IMPRESSION: 1. CT findings suggest a urinary tract infection.  Findings compatible with pyelonephritis of the right kidney. Mild urinary bladder wall thickening suggests  cystitis in the appropriate clinical setting, and there is mild urothelial enhancement of the proximal right ureter, also seen in the setting of urinary tract infection.  Suggest correlation with clinical symptoms. 2.  Multiple hepatic cysts, stable. 3.  Small hiatal hernia. 4.  Nonvisualization of the appendix. 5.  Hysterectomy.   Original Report Authenticated By: Britta Mccreedy, M.D.    Medications:  Prior to Admission:  Prescriptions prior to admission  Medication Sig Dispense Refill  . aspirin EC 81 MG tablet Take 81 mg by mouth daily.      . cholecalciferol (VITAMIN D) 1000 UNITS tablet Take 1,000 Units by mouth daily.      Marland Kitchen estradiol (CLIMARA - DOSED IN MG/24 HR) 0.0375 mg/24hr Place 1 patch onto the skin once a week.      . levothyroxine (SYNTHROID, LEVOTHROID) 75 MCG tablet Take 75 mcg by mouth daily.      Marland Kitchen olmesartan-hydrochlorothiazide (BENICAR HCT) 40-12.5 MG per tablet Take 1 tablet by mouth daily.       Scheduled: . sodium chloride   Intravenous STAT  . aspirin EC  81 mg  Oral Daily  . cefTRIAXone (ROCEPHIN)  IV  1 g Intravenous Q24H  . enoxaparin (LOVENOX) injection  30 mg Subcutaneous Q24H  . estradiol  0.0375 mg Transdermal Weekly  . irbesartan  300 mg Oral Daily   And  . hydrochlorothiazide  12.5 mg Oral Daily  . levothyroxine  75 mcg Oral QAC breakfast   Continuous: . 0.9 % NaCl with KCl 20 mEq / L 100 mL/hr at 02/02/13 0006   ZOX:[id number], HYDROmorphone (DILAUDID) injection, ondansetron (ZOFRAN) IV, polyethylene glycol, traZODone  Assesment: She has pyelonephritis and is better. She has chronic overactive bladder. She is more anemic. Active Problems:   Pyelonephritis    Plan: Continue current treatments. I will add Cipro. She will continue her other medications. She will have an anemia profile    LOS: 1 day   Collan Schoenfeld L 02/02/2013, 9:36 AM

## 2013-02-03 DIAGNOSIS — D509 Iron deficiency anemia, unspecified: Secondary | ICD-10-CM | POA: Diagnosis present

## 2013-02-03 DIAGNOSIS — E538 Deficiency of other specified B group vitamins: Secondary | ICD-10-CM | POA: Diagnosis present

## 2013-02-03 MED ORDER — HYDROCOD POLST-CHLORPHEN POLST 10-8 MG/5ML PO LQCR
5.0000 mL | Freq: Two times a day (BID) | ORAL | Status: DC
  Administered 2013-02-03 – 2013-02-04 (×4): 5 mL via ORAL
  Filled 2013-02-03 (×4): qty 5

## 2013-02-03 MED ORDER — FERROUS FUMARATE 325 (106 FE) MG PO TABS
1.0000 | ORAL_TABLET | Freq: Every day | ORAL | Status: DC
  Administered 2013-02-03 – 2013-02-04 (×2): 106 mg via ORAL
  Filled 2013-02-03 (×2): qty 1

## 2013-02-03 MED ORDER — CYANOCOBALAMIN 1000 MCG/ML IJ SOLN
1000.0000 ug | Freq: Every day | INTRAMUSCULAR | Status: DC
  Administered 2013-02-03 – 2013-02-04 (×2): 1000 ug via INTRAMUSCULAR
  Filled 2013-02-03 (×2): qty 1

## 2013-02-03 NOTE — Progress Notes (Signed)
Subjective: She feels okay but had cough during the night. She has no other new complaints. She was noted to be anemic and her anemia profile shows both iron deficiency and vitamin B 12 deficiency  Objective: Vital signs in last 24 hours: Temp:  [97.9 F (36.6 C)-98.6 F (37 C)] 97.9 F (36.6 C) (05/18 0500) Pulse Rate:  [69-79] 71 (05/18 0500) Resp:  [18-20] 20 (05/18 0500) BP: (115-121)/(72-78) 121/78 mmHg (05/18 0500) SpO2:  [97 %-99 %] 97 % (05/18 0500) Weight:  [74.1 kg (163 lb 5.8 oz)] 74.1 kg (163 lb 5.8 oz) (05/18 0500) Weight change: 1.071 kg (2 lb 5.8 oz) Last BM Date: 01/31/13  Intake/Output from previous day: 05/17 0701 - 05/18 0700 In: 480 [P.O.:480] Out: -   PHYSICAL EXAM General appearance: alert, cooperative and no distress Resp: clear to auscultation bilaterally Cardio: regular rate and rhythm, S1, S2 normal, no murmur, click, rub or gallop GI: soft, non-tender; bowel sounds normal; no masses,  no organomegaly Extremities: extremities normal, atraumatic, no cyanosis or edema  Lab Results:    Basic Metabolic Panel:  Recent Labs  91/47/82 1321 02/02/13 0714  NA 132* 139  K 3.3* 3.4*  CL 95* 103  CO2 24 25  GLUCOSE 140* 110*  BUN 13 18  CREATININE 1.13* 1.43*  CALCIUM 9.2 7.9*   Liver Function Tests:  Recent Labs  02/01/13 1321  AST 16  ALT 11  ALKPHOS 73  BILITOT 1.1  PROT 7.6  ALBUMIN 3.6   No results found for this basename: LIPASE, AMYLASE,  in the last 72 hours No results found for this basename: AMMONIA,  in the last 72 hours CBC:  Recent Labs  02/01/13 1321 02/02/13 0714  WBC 9.3 7.0  NEUTROABS 8.3*  --   HGB 11.6* 9.6*  HCT 34.7* 29.4*  MCV 90.1 91.9  PLT 217 187   Cardiac Enzymes: No results found for this basename: CKTOTAL, CKMB, CKMBINDEX, TROPONINI,  in the last 72 hours BNP: No results found for this basename: PROBNP,  in the last 72 hours D-Dimer: No results found for this basename: DDIMER,  in the last 72  hours CBG: No results found for this basename: GLUCAP,  in the last 72 hours Hemoglobin A1C: No results found for this basename: HGBA1C,  in the last 72 hours Fasting Lipid Panel: No results found for this basename: CHOL, HDL, LDLCALC, TRIG, CHOLHDL, LDLDIRECT,  in the last 72 hours Thyroid Function Tests: No results found for this basename: TSH, T4TOTAL, FREET4, T3FREE, THYROIDAB,  in the last 72 hours Anemia Panel:  Recent Labs  02/02/13 1000  VITAMINB12 176*  FOLATE 16.4  FERRITIN 121  TIBC 238*  IRON 14*  RETICCTPCT 1.5   Coagulation: No results found for this basename: LABPROT, INR,  in the last 72 hours Urine Drug Screen: Drugs of Abuse  No results found for this basename: labopia, cocainscrnur, labbenz, amphetmu, thcu, labbarb    Alcohol Level: No results found for this basename: ETH,  in the last 72 hours Urinalysis:  Recent Labs  02/01/13 1257  COLORURINE YELLOW  LABSPEC 1.025  PHURINE 6.0  GLUCOSEU NEGATIVE  HGBUR LARGE*  BILIRUBINUR SMALL*  KETONESUR 40*  PROTEINUR 30*  UROBILINOGEN 4.0*  NITRITE POSITIVE*  LEUKOCYTESUR SMALL*   Misc. Labs:  ABGS No results found for this basename: PHART, PCO2, PO2ART, TCO2, HCO3,  in the last 72 hours CULTURES Recent Results (from the past 240 hour(s))  URINE CULTURE     Status: None  Collection Time    02/01/13 12:57 PM      Result Value Range Status   Specimen Description URINE, CLEAN CATCH   Final   Special Requests NONE   Final   Culture  Setup Time 02/02/2013 01:33   Final   Colony Count >=100,000 COLONIES/ML   Final   Culture ESCHERICHIA COLI   Final   Report Status PENDING   Incomplete   Studies/Results: Dg Chest 2 View  02/01/2013   *RADIOLOGY REPORT*  Clinical Data: Fever and cough.  CHEST - 2 VIEW  Comparison: 06/14/2011  Findings: Lungs are hypoinflated without focal consolidation or effusion.  There is mild cardiomegaly unchanged.  Remainder of the exam is unchanged.  IMPRESSION: Hypoinflation  without acute cardiopulmonary disease.  Mild stable cardiomegaly.   Original Report Authenticated By: Elberta Fortis, M.D.   Ct Abdomen Pelvis W Contrast  02/01/2013   *RADIOLOGY REPORT*  Clinical Data: Fever, fever and right lower quadrant tenderness. Evaluate appendix.  CT ABDOMEN AND PELVIS WITH CONTRAST  Technique:  Multidetector CT imaging of the abdomen and pelvis was performed following the standard protocol during bolus administration of intravenous contrast.  Contrast: 50mL OMNIPAQUE IOHEXOL 300 MG/ML  SOLN, OMNIPAQUE IOHEXOL 300 MG/ML  SOLN  Comparison: CT abdomen pelvis 04/25/2009  Findings: Stable linear scarring in the right lower lobe.  Heart size is borderline enlarged, but incompletely visualized.  There is a small hiatal hernia.  Negative for pleural effusion.  Multiple circumscribed water density lesions in the liver are stable compared to a CT abdomen pelvis of 2010 and consistent with benign hepatic cysts.  There is a low density lesion with peripheral nodular enhancement that fills in on the delayed images in the left lobe, consistent with a benign hemangioma.  The portal vein is patent.  The spleen, adrenal glands, pancreas, gallbladder, and left kidney are within normal limits.  There are patchy non mass-like areas of decreased attenuation in the right kidney, centered in the mid aspect of the kidney.  The right renal contour is within normal limits and stable.  There is no hydronephrosis on the right.  Both ureters are normal in caliber. There is suggestion of faint urothelial enhancement of the proximal right ureter No ureteral stones are visualized.  No perinephric fluid collection.  Minimal haziness in the perinephric fat on the right.  Urinary bladder is not very distended at the time imaging, but demonstrates probable mild urinary bladder wall thickening, measuring up to 5 mm, with some indistinctness of the perivesicular fat anteriorly.  Terminal ileum is normal.  The cecum  appears normal.  A definite appendix is not seen.  There are no right abdominal inflammatory changes near the cecum to suggest acute appendicitis.  Hysterectomy.  No adnexal mass, lymphadenopathy, or ascites.  IMPRESSION: 1. CT findings suggest a urinary tract infection.  Findings compatible with pyelonephritis of the right kidney. Mild urinary bladder wall thickening suggests cystitis in the appropriate clinical setting, and there is mild urothelial enhancement of the proximal right ureter, also seen in the setting of urinary tract infection.  Suggest correlation with clinical symptoms. 2.  Multiple hepatic cysts, stable. 3.  Small hiatal hernia. 4.  Nonvisualization of the appendix. 5.  Hysterectomy.   Original Report Authenticated By: Britta Mccreedy, M.D.    Medications:  Prior to Admission:  Prescriptions prior to admission  Medication Sig Dispense Refill  . aspirin EC 81 MG tablet Take 81 mg by mouth daily.      . cholecalciferol (  VITAMIN D) 1000 UNITS tablet Take 1,000 Units by mouth daily.      Marland Kitchen estradiol (CLIMARA - DOSED IN MG/24 HR) 0.0375 mg/24hr Place 1 patch onto the skin once a week.      . levothyroxine (SYNTHROID, LEVOTHROID) 75 MCG tablet Take 75 mcg by mouth daily.      Marland Kitchen olmesartan-hydrochlorothiazide (BENICAR HCT) 40-12.5 MG per tablet Take 1 tablet by mouth daily.       Scheduled: . aspirin EC  81 mg Oral Daily  . cefTRIAXone (ROCEPHIN)  IV  1 g Intravenous Q24H  . chlorpheniramine-HYDROcodone  5 mL Oral Q12H  . ciprofloxacin  400 mg Intravenous Q12H  . cyanocobalamin  1,000 mcg Intramuscular Daily  . enoxaparin (LOVENOX) injection  30 mg Subcutaneous Q24H  . estradiol  0.0375 mg Transdermal Weekly  . ferrous fumarate  1 tablet Oral Daily  . irbesartan  300 mg Oral Daily   And  . hydrochlorothiazide  12.5 mg Oral Daily  . levothyroxine  75 mcg Oral QAC breakfast   Continuous: . 0.9 % NaCl with KCl 20 mEq / L 100 mL/hr at 02/02/13 2143   ZOX:[id number],  HYDROmorphone (DILAUDID) injection, ondansetron (ZOFRAN) IV, polyethylene glycol, traZODone  Assesment: She was admitted with pyelonephritis and seems to be doing better with that. She has hypertension which is pre-well controlled. She has a new problem which is anemia and fever is evidence of both iron deficiency and vitamin B 12 deficiency. We discussed working that up but she is hopeful that we'll go home tomorrow and we can do that as an outpatient if she does well enough for discharge tomorrow Active Problems:   Pyelonephritis   Essential hypertension, benign   Anemia   Dehydration   Unspecified hypothyroidism    Plan: I will add vitamin B 12 and iron continue with her other treatments.    LOS: 2 days   Rollan Roger L 02/03/2013, 8:06 AM

## 2013-02-04 LAB — URINE CULTURE

## 2013-02-04 MED ORDER — ENOXAPARIN SODIUM 40 MG/0.4ML ~~LOC~~ SOLN
40.0000 mg | SUBCUTANEOUS | Status: DC
  Administered 2013-02-05: 40 mg via SUBCUTANEOUS
  Filled 2013-02-04: qty 0.4

## 2013-02-04 NOTE — Progress Notes (Signed)
Utilization Review Complete  

## 2013-02-04 NOTE — Progress Notes (Signed)
Subjective: She feels better but is still weak. She is still coughing.  Objective: Vital signs in last 24 hours: Temp:  [98.4 F (36.9 C)] 98.4 F (36.9 C) (05/19 0435) Pulse Rate:  [65-68] 68 (05/19 0435) Resp:  [18] 18 (05/19 0435) BP: (119-129)/(71-74) 119/71 mmHg (05/19 0435) SpO2:  [98 %] 98 % (05/19 0435) Weight:  [76.1 kg (167 lb 12.3 oz)] 76.1 kg (167 lb 12.3 oz) (05/19 0435) Weight change: 2 kg (4 lb 6.6 oz) Last BM Date: 01/31/13  Intake/Output from previous day: 05/18 0701 - 05/19 0700 In: 240 [P.O.:240] Out: -   PHYSICAL EXAM General appearance: alert, cooperative and no distress Resp: clear to auscultation bilaterally Cardio: regular rate and rhythm, S1, S2 normal, no murmur, click, rub or gallop GI: soft, non-tender; bowel sounds normal; no masses,  no organomegaly Extremities: extremities normal, atraumatic, no cyanosis or edema  Lab Results:    Basic Metabolic Panel:  Recent Labs  16/10/96 1321 02/02/13 0714  NA 132* 139  K 3.3* 3.4*  CL 95* 103  CO2 24 25  GLUCOSE 140* 110*  BUN 13 18  CREATININE 1.13* 1.43*  CALCIUM 9.2 7.9*   Liver Function Tests:  Recent Labs  02/01/13 1321  AST 16  ALT 11  ALKPHOS 73  BILITOT 1.1  PROT 7.6  ALBUMIN 3.6   No results found for this basename: LIPASE, AMYLASE,  in the last 72 hours No results found for this basename: AMMONIA,  in the last 72 hours CBC:  Recent Labs  02/01/13 1321 02/02/13 0714  WBC 9.3 7.0  NEUTROABS 8.3*  --   HGB 11.6* 9.6*  HCT 34.7* 29.4*  MCV 90.1 91.9  PLT 217 187   Cardiac Enzymes: No results found for this basename: CKTOTAL, CKMB, CKMBINDEX, TROPONINI,  in the last 72 hours BNP: No results found for this basename: PROBNP,  in the last 72 hours D-Dimer: No results found for this basename: DDIMER,  in the last 72 hours CBG: No results found for this basename: GLUCAP,  in the last 72 hours Hemoglobin A1C: No results found for this basename: HGBA1C,  in the last  72 hours Fasting Lipid Panel: No results found for this basename: CHOL, HDL, LDLCALC, TRIG, CHOLHDL, LDLDIRECT,  in the last 72 hours Thyroid Function Tests: No results found for this basename: TSH, T4TOTAL, FREET4, T3FREE, THYROIDAB,  in the last 72 hours Anemia Panel:  Recent Labs  02/02/13 1000  VITAMINB12 176*  FOLATE 16.4  FERRITIN 121  TIBC 238*  IRON 14*  RETICCTPCT 1.5   Coagulation: No results found for this basename: LABPROT, INR,  in the last 72 hours Urine Drug Screen: Drugs of Abuse  No results found for this basename: labopia, cocainscrnur, labbenz, amphetmu, thcu, labbarb    Alcohol Level: No results found for this basename: ETH,  in the last 72 hours Urinalysis:  Recent Labs  02/01/13 1257  COLORURINE YELLOW  LABSPEC 1.025  PHURINE 6.0  GLUCOSEU NEGATIVE  HGBUR LARGE*  BILIRUBINUR SMALL*  KETONESUR 40*  PROTEINUR 30*  UROBILINOGEN 4.0*  NITRITE POSITIVE*  LEUKOCYTESUR SMALL*   Misc. Labs:  ABGS No results found for this basename: PHART, PCO2, PO2ART, TCO2, HCO3,  in the last 72 hours CULTURES Recent Results (from the past 240 hour(s))  URINE CULTURE     Status: None   Collection Time    02/01/13 12:57 PM      Result Value Range Status   Specimen Description URINE, CLEAN CATCH  Final   Special Requests NONE   Final   Culture  Setup Time 02/02/2013 01:33   Final   Colony Count >=100,000 COLONIES/ML   Final   Culture ESCHERICHIA COLI   Final   Report Status PENDING   Incomplete   Studies/Results: No results found.  Medications:  Prior to Admission:  Prescriptions prior to admission  Medication Sig Dispense Refill  . aspirin EC 81 MG tablet Take 81 mg by mouth daily.      . cholecalciferol (VITAMIN D) 1000 UNITS tablet Take 1,000 Units by mouth daily.      Marland Kitchen estradiol (CLIMARA - DOSED IN MG/24 HR) 0.0375 mg/24hr Place 1 patch onto the skin once a week.      . levothyroxine (SYNTHROID, LEVOTHROID) 75 MCG tablet Take 75 mcg by mouth  daily.      Marland Kitchen olmesartan-hydrochlorothiazide (BENICAR HCT) 40-12.5 MG per tablet Take 1 tablet by mouth daily.       Scheduled: . aspirin EC  81 mg Oral Daily  . cefTRIAXone (ROCEPHIN)  IV  1 g Intravenous Q24H  . chlorpheniramine-HYDROcodone  5 mL Oral Q12H  . ciprofloxacin  400 mg Intravenous Q12H  . cyanocobalamin  1,000 mcg Intramuscular Daily  . enoxaparin (LOVENOX) injection  30 mg Subcutaneous Q24H  . estradiol  0.0375 mg Transdermal Weekly  . ferrous fumarate  1 tablet Oral Daily  . irbesartan  300 mg Oral Daily   And  . hydrochlorothiazide  12.5 mg Oral Daily  . levothyroxine  75 mcg Oral QAC breakfast   Continuous:  ZOX:[id number], HYDROmorphone (DILAUDID) injection, ondansetron (ZOFRAN) IV, polyethylene glycol, traZODone  Assesment: She has pyelonephritis. She is anemic. She had severe cough. She is still pretty weak. She has Escherichia coli in her urine and her renal function was not quite as good yesterday Active Problems:   Pyelonephritis   Essential hypertension, benign   Anemia   Dehydration   Unspecified hypothyroidism   Anemia, iron deficiency   Vitamin B12 deficiency    Plan: Continue IV antibiotics. Discontinue IV fluids. Encourage ambulation. Recheck basic metabolic profile tomorrow    LOS: 3 days   Heather Cobb L 02/04/2013, 8:45 AM

## 2013-02-05 DIAGNOSIS — N10 Acute tubulo-interstitial nephritis: Secondary | ICD-10-CM | POA: Diagnosis not present

## 2013-02-05 LAB — BASIC METABOLIC PANEL
Chloride: 104 mEq/L (ref 96–112)
GFR calc Af Amer: 55 mL/min — ABNORMAL LOW (ref >90)
GFR calc non Af Amer: 48 mL/min — ABNORMAL LOW (ref >90)
Potassium: 3.5 mEq/L (ref 3.5–5.1)

## 2013-02-05 MED ORDER — AMOXICILLIN 500 MG PO CAPS: 500.0000 mg | ORAL_CAPSULE | Freq: Three times a day (TID) | ORAL | Status: DC

## 2013-02-05 MED ORDER — FERROUS FUMARATE 325 (106 FE) MG PO TABS: 1.0000 | ORAL_TABLET | Freq: Every day | ORAL | Status: DC

## 2013-02-05 MED ORDER — CYANOCOBALAMIN 100 MCG PO TABS: 1000.0000 ug | ORAL_TABLET | Freq: Every day | ORAL | Status: DC

## 2013-02-05 NOTE — Progress Notes (Signed)
Patient states understanding of discharge. 

## 2013-02-05 NOTE — Care Management Note (Signed)
    Page 1 of 1   02/05/2013     2:28:21 PM   CARE MANAGEMENT NOTE 02/05/2013  Patient:  Heather Cobb, Heather Cobb   Account Number:  [id number]  Date Initiated:  02/05/2013  Documentation initiated by:  Rosemary Holms  Subjective/Objective Assessment:   pt admitted from home and independent. DC home with no HH needs identified.     Action/Plan:   Anticipated DC Date:  02/05/2013   Anticipated DC Plan:  HOME/SELF CARE      DC Planning Services  CM consult      Choice offered to / List presented to:             Status of service:  Completed, signed off Medicare Important Message given?  YES (If response is "NO", the following Medicare IM given date fields will be blank) Date Medicare IM given:  02/05/2013 Date Additional Medicare IM given:    Discharge Disposition:  HOME/SELF CARE  Per UR Regulation:    If discussed at Long Length of Stay Meetings, dates discussed:    Comments:  02/05/13 Rosemary Holms RN BSN CM

## 2013-02-05 NOTE — Progress Notes (Signed)
Subjective: She feels much better and wants to go home. She has no new complaints  Objective: Vital signs in last 24 hours: Temp:  [97.8 F (36.6 C)-98.4 F (36.9 C)] 98 F (36.7 C) (05/20 0500) Pulse Rate:  [56-74] 56 (05/20 0500) Resp:  [20] 20 (05/20 0500) BP: (112-160)/(57-83) 112/57 mmHg (05/20 0500) SpO2:  [97 %-100 %] 99 % (05/20 0500) Weight:  [74.4 kg (164 lb 0.4 oz)] 74.4 kg (164 lb 0.4 oz) (05/20 0500) Weight change: -1.7 kg (-3 lb 12 oz) Last BM Date: 01/31/13  Intake/Output from previous day: 05/19 0701 - 05/20 0700 In: 720 [P.O.:720] Out: -   PHYSICAL EXAM General appearance: alert, cooperative and no distress Resp: clear to auscultation bilaterally Cardio: regular rate and rhythm, S1, S2 normal, no murmur, click, rub or gallop GI: soft, non-tender; bowel sounds normal; no masses,  no organomegaly Extremities: extremities normal, atraumatic, no cyanosis or edema  Lab Results:    Basic Metabolic Panel:  Recent Labs  19/14/78 0506  NA 140  K 3.5  CL 104  CO2 28  GLUCOSE 95  BUN 13  CREATININE 1.12*  CALCIUM 8.7   Liver Function Tests: No results found for this basename: AST, ALT, ALKPHOS, BILITOT, PROT, ALBUMIN,  in the last 72 hours No results found for this basename: LIPASE, AMYLASE,  in the last 72 hours No results found for this basename: AMMONIA,  in the last 72 hours CBC: No results found for this basename: WBC, NEUTROABS, HGB, HCT, MCV, PLT,  in the last 72 hours Cardiac Enzymes: No results found for this basename: CKTOTAL, CKMB, CKMBINDEX, TROPONINI,  in the last 72 hours BNP: No results found for this basename: PROBNP,  in the last 72 hours D-Dimer: No results found for this basename: DDIMER,  in the last 72 hours CBG: No results found for this basename: GLUCAP,  in the last 72 hours Hemoglobin A1C: No results found for this basename: HGBA1C,  in the last 72 hours Fasting Lipid Panel: No results found for this basename: CHOL, HDL,  LDLCALC, TRIG, CHOLHDL, LDLDIRECT,  in the last 72 hours Thyroid Function Tests: No results found for this basename: TSH, T4TOTAL, FREET4, T3FREE, THYROIDAB,  in the last 72 hours Anemia Panel:  Recent Labs  02/02/13 1000  VITAMINB12 176*  FOLATE 16.4  FERRITIN 121  TIBC 238*  IRON 14*  RETICCTPCT 1.5   Coagulation: No results found for this basename: LABPROT, INR,  in the last 72 hours Urine Drug Screen: Drugs of Abuse  No results found for this basename: labopia, cocainscrnur, labbenz, amphetmu, thcu, labbarb    Alcohol Level: No results found for this basename: ETH,  in the last 72 hours Urinalysis: No results found for this basename: COLORURINE, APPERANCEUR, LABSPEC, PHURINE, GLUCOSEU, HGBUR, BILIRUBINUR, KETONESUR, PROTEINUR, UROBILINOGEN, NITRITE, LEUKOCYTESUR,  in the last 72 hours Misc. Labs:  ABGS No results found for this basename: PHART, PCO2, PO2ART, TCO2, HCO3,  in the last 72 hours CULTURES Recent Results (from the past 240 hour(s))  URINE CULTURE     Status: None   Collection Time    02/01/13 12:57 PM      Result Value Range Status   Specimen Description URINE, CLEAN CATCH   Final   Special Requests NONE   Final   Culture  Setup Time 02/02/2013 01:33   Final   Colony Count >=100,000 COLONIES/ML   Final   Culture ESCHERICHIA COLI   Final   Report Status 02/04/2013 FINAL   Final  Organism ID, Bacteria ESCHERICHIA COLI   Final   Studies/Results: No results found.  Medications:  Prior to Admission:  Prescriptions prior to admission  Medication Sig Dispense Refill  . aspirin EC 81 MG tablet Take 81 mg by mouth daily.      . cholecalciferol (VITAMIN D) 1000 UNITS tablet Take 1,000 Units by mouth daily.      Marland Kitchen estradiol (CLIMARA - DOSED IN MG/24 HR) 0.0375 mg/24hr Place 1 patch onto the skin once a week.      . levothyroxine (SYNTHROID, LEVOTHROID) 75 MCG tablet Take 75 mcg by mouth daily.      Marland Kitchen olmesartan-hydrochlorothiazide (BENICAR HCT) 40-12.5 MG  per tablet Take 1 tablet by mouth daily.       Scheduled: . aspirin EC  81 mg Oral Daily  . cefTRIAXone (ROCEPHIN)  IV  1 g Intravenous Q24H  . chlorpheniramine-HYDROcodone  5 mL Oral Q12H  . ciprofloxacin  400 mg Intravenous Q12H  . cyanocobalamin  1,000 mcg Intramuscular Daily  . enoxaparin (LOVENOX) injection  40 mg Subcutaneous Q24H  . estradiol  0.0375 mg Transdermal Weekly  . ferrous fumarate  1 tablet Oral Daily  . irbesartan  300 mg Oral Daily   And  . hydrochlorothiazide  12.5 mg Oral Daily  . levothyroxine  75 mcg Oral QAC breakfast   Continuous:  [id number], HYDROmorphone (DILAUDID) injection, ondansetron (ZOFRAN) IV, polyethylene glycol, traZODone  Assesment: She has pyelonephritis due to Escherichia coli. It is pansensitive. She has hypertension which is stable. She is anemic and has both iron deficiency and vitamin B 12 deficiency. Active Problems:   Pyelonephritis   Essential hypertension, benign   Anemia   Dehydration   Unspecified hypothyroidism   Anemia, iron deficiency   Vitamin B12 deficiency    Plan: She will be discharged home today. She will have workup of her anemia as an outpatient. She had colonoscopy approximately 2 years ago which was okay    LOS: 4 days   Lakhia Gengler L 02/05/2013, 9:09 AM

## 2013-02-08 NOTE — Discharge Summary (Signed)
Physician Discharge Summary  Patient ID: Heather Cobb MRN: [id number] DOB/AGE: 12/23/1940 72 y.o. Primary Care Physician:Oveda Dadamo L, MD Admit date: 02/01/2013 Discharge date: 02/08/2013    Discharge Diagnoses:   Active Problems:   Pyelonephritis   Essential hypertension, benign   Anemia   Dehydration   Unspecified hypothyroidism   Anemia, iron deficiency   Vitamin B12 deficiency     Medication List    TAKE these medications       amoxicillin 500 MG capsule  Commonly known as:  AMOXIL  Take 1 capsule (500 mg total) by mouth 3 (three) times daily.     aspirin EC 81 MG tablet  Take 81 mg by mouth daily.     cholecalciferol 1000 UNITS tablet  Commonly known as:  VITAMIN D  Take 1,000 Units by mouth daily.     cyanocobalamin 100 MCG tablet  Take 10 tablets (1,000 mcg total) by mouth daily.     estradiol 0.0375 mg/24hr  Commonly known as:  CLIMARA - Dosed in mg/24 hr  Place 1 patch onto the skin once a week.     ferrous fumarate 325 (106 FE) MG Tabs  Commonly known as:  HEMOCYTE - 106 mg FE  Take 1 tablet (106 mg of iron total) by mouth daily.     levothyroxine 75 MCG tablet  Commonly known as:  SYNTHROID, LEVOTHROID  Take 75 mcg by mouth daily.     olmesartan-hydrochlorothiazide 40-12.5 MG per tablet  Commonly known as:  BENICAR HCT  Take 1 tablet by mouth daily.        Discharged Condition: Improved    Consults: None  Significant Diagnostic Studies: Dg Chest 2 View  02/01/2013   *RADIOLOGY REPORT*  Clinical Data: Fever and cough.  CHEST - 2 VIEW  Comparison: 06/14/2011  Findings: Lungs are hypoinflated without focal consolidation or effusion.  There is mild cardiomegaly unchanged.  Remainder of the exam is unchanged.  IMPRESSION: Hypoinflation without acute cardiopulmonary disease.  Mild stable cardiomegaly.   Original Report Authenticated By: Elberta Fortis, M.D.   Ct Abdomen Pelvis W Contrast  02/01/2013   *RADIOLOGY REPORT*  Clinical Data:  Fever, fever and right lower quadrant tenderness. Evaluate appendix.  CT ABDOMEN AND PELVIS WITH CONTRAST  Technique:  Multidetector CT imaging of the abdomen and pelvis was performed following the standard protocol during bolus administration of intravenous contrast.  Contrast: 50mL OMNIPAQUE IOHEXOL 300 MG/ML  SOLN, OMNIPAQUE IOHEXOL 300 MG/ML  SOLN  Comparison: CT abdomen pelvis 04/25/2009  Findings: Stable linear scarring in the right lower lobe.  Heart size is borderline enlarged, but incompletely visualized.  There is a small hiatal hernia.  Negative for pleural effusion.  Multiple circumscribed water density lesions in the liver are stable compared to a CT abdomen pelvis of 2010 and consistent with benign hepatic cysts.  There is a low density lesion with peripheral nodular enhancement that fills in on the delayed images in the left lobe, consistent with a benign hemangioma.  The portal vein is patent.  The spleen, adrenal glands, pancreas, gallbladder, and left kidney are within normal limits.  There are patchy non mass-like areas of decreased attenuation in the right kidney, centered in the mid aspect of the kidney.  The right renal contour is within normal limits and stable.  There is no hydronephrosis on the right.  Both ureters are normal in caliber. There is suggestion of faint urothelial enhancement of the proximal right ureter No ureteral stones are visualized.  No  perinephric fluid collection.  Minimal haziness in the perinephric fat on the right.  Urinary bladder is not very distended at the time imaging, but demonstrates probable mild urinary bladder wall thickening, measuring up to 5 mm, with some indistinctness of the perivesicular fat anteriorly.  Terminal ileum is normal.  The cecum appears normal.  A definite appendix is not seen.  There are no right abdominal inflammatory changes near the cecum to suggest acute appendicitis.  Hysterectomy.  No adnexal mass, lymphadenopathy, or ascites.   IMPRESSION: 1. CT findings suggest a urinary tract infection.  Findings compatible with pyelonephritis of the right kidney. Mild urinary bladder wall thickening suggests cystitis in the appropriate clinical setting, and there is mild urothelial enhancement of the proximal right ureter, also seen in the setting of urinary tract infection.  Suggest correlation with clinical symptoms. 2.  Multiple hepatic cysts, stable. 3.  Small hiatal hernia. 4.  Nonvisualization of the appendix. 5.  Hysterectomy.   Original Report Authenticated By: Britta Mccreedy, M.D.    Lab Results: Basic Metabolic Panel: No results found for this basename: NA, K, CL, CO2, GLUCOSE, BUN, CREATININE, CALCIUM, MG, PHOS,  in the last 72 hours Liver Function Tests: No results found for this basename: AST, ALT, ALKPHOS, BILITOT, PROT, ALBUMIN,  in the last 72 hours   CBC: No results found for this basename: WBC, NEUTROABS, HGB, HCT, MCV, PLT,  in the last 72 hours  Recent Results (from the past 240 hour(s))  URINE CULTURE     Status: None   Collection Time    02/01/13 12:57 PM      Result Value Range Status   Specimen Description URINE, CLEAN CATCH   Final   Special Requests NONE   Final   Culture  Setup Time 02/02/2013 01:33   Final   Colony Count >=100,000 COLONIES/ML   Final   Culture ESCHERICHIA COLI   Final   Report Status 02/04/2013 FINAL   Final   Organism ID, Bacteria ESCHERICHIA COLI   Final     Hospital Course: She was admitted with fever chills nausea vomiting diarrhea and flank pain. She was found to have pyelonephritis and it turned out this was from Escherichia coli which was pansensitive. She was initially started on Rocephin and Cipro. She improved and was able to keep food down and was able to ambulate. She was also found to be anemic and had low vitamin B 12 level as well as low iron saturation. This will be evaluated as an outpatient.  Discharge Exam: Blood pressure 118/75, pulse 62, temperature 98.3 F  (36.8 C), temperature source Oral, resp. rate 20, height 5\' 4"  (1.626 m), weight 74.4 kg (164 lb 0.4 oz), SpO2 99.00%. She is awake and alert. She looks comfortable. Her chest is clear. Her flank pain has resolved. She is afebrile  Disposition: Home she will follow in my office in about a week and she will need an outpatient evaluation of anemia      Discharge Orders   Future Orders Complete By Expires     Discharge patient  As directed          Signed: Mariaelena Cade L Pager [phone]  02/08/2013, 8:15 AM

## 2013-02-14 DIAGNOSIS — E039 Hypothyroidism, unspecified: Secondary | ICD-10-CM | POA: Diagnosis not present

## 2013-02-14 DIAGNOSIS — N119 Chronic tubulo-interstitial nephritis, unspecified: Secondary | ICD-10-CM | POA: Diagnosis not present

## 2013-02-14 DIAGNOSIS — N390 Urinary tract infection, site not specified: Secondary | ICD-10-CM | POA: Diagnosis not present

## 2013-02-14 DIAGNOSIS — I10 Essential (primary) hypertension: Secondary | ICD-10-CM | POA: Diagnosis not present

## 2013-02-14 DIAGNOSIS — D649 Anemia, unspecified: Secondary | ICD-10-CM | POA: Diagnosis not present

## 2013-03-12 DIAGNOSIS — D509 Iron deficiency anemia, unspecified: Secondary | ICD-10-CM | POA: Diagnosis not present

## 2013-03-12 DIAGNOSIS — K5900 Constipation, unspecified: Secondary | ICD-10-CM | POA: Diagnosis not present

## 2013-03-29 DIAGNOSIS — I10 Essential (primary) hypertension: Secondary | ICD-10-CM | POA: Diagnosis not present

## 2013-03-29 DIAGNOSIS — E039 Hypothyroidism, unspecified: Secondary | ICD-10-CM | POA: Diagnosis not present

## 2013-03-29 DIAGNOSIS — N951 Menopausal and female climacteric states: Secondary | ICD-10-CM | POA: Diagnosis not present

## 2013-03-29 DIAGNOSIS — D649 Anemia, unspecified: Secondary | ICD-10-CM | POA: Diagnosis not present

## 2013-05-07 DIAGNOSIS — H2510 Age-related nuclear cataract, unspecified eye: Secondary | ICD-10-CM | POA: Diagnosis not present

## 2013-05-07 DIAGNOSIS — H25019 Cortical age-related cataract, unspecified eye: Secondary | ICD-10-CM | POA: Diagnosis not present

## 2013-05-07 DIAGNOSIS — H25049 Posterior subcapsular polar age-related cataract, unspecified eye: Secondary | ICD-10-CM | POA: Diagnosis not present

## 2013-05-07 DIAGNOSIS — H02839 Dermatochalasis of unspecified eye, unspecified eyelid: Secondary | ICD-10-CM | POA: Diagnosis not present

## 2013-05-10 DIAGNOSIS — K319 Disease of stomach and duodenum, unspecified: Secondary | ICD-10-CM | POA: Diagnosis not present

## 2013-05-10 DIAGNOSIS — D509 Iron deficiency anemia, unspecified: Secondary | ICD-10-CM | POA: Diagnosis not present

## 2013-05-10 DIAGNOSIS — D1330 Benign neoplasm of unspecified part of small intestine: Secondary | ICD-10-CM | POA: Diagnosis not present

## 2013-05-10 DIAGNOSIS — K2960 Other gastritis without bleeding: Secondary | ICD-10-CM | POA: Diagnosis not present

## 2013-05-10 DIAGNOSIS — K449 Diaphragmatic hernia without obstruction or gangrene: Secondary | ICD-10-CM | POA: Diagnosis not present

## 2013-05-31 DIAGNOSIS — Z79899 Other long term (current) drug therapy: Secondary | ICD-10-CM | POA: Diagnosis not present

## 2013-05-31 DIAGNOSIS — I10 Essential (primary) hypertension: Secondary | ICD-10-CM | POA: Diagnosis not present

## 2013-05-31 DIAGNOSIS — E039 Hypothyroidism, unspecified: Secondary | ICD-10-CM | POA: Diagnosis not present

## 2013-05-31 DIAGNOSIS — D649 Anemia, unspecified: Secondary | ICD-10-CM | POA: Diagnosis not present

## 2013-05-31 DIAGNOSIS — Z23 Encounter for immunization: Secondary | ICD-10-CM | POA: Diagnosis not present

## 2013-06-05 ENCOUNTER — Other Ambulatory Visit (HOSPITAL_COMMUNITY): Payer: Self-pay | Admitting: Pulmonary Disease

## 2013-06-05 DIAGNOSIS — Z1231 Encounter for screening mammogram for malignant neoplasm of breast: Secondary | ICD-10-CM

## 2013-06-21 ENCOUNTER — Ambulatory Visit (HOSPITAL_COMMUNITY)
Admission: RE | Admit: 2013-06-21 | Discharge: 2013-06-21 | Disposition: A | Discharge status: Home / Self Care | Payer: Medicare Other | Source: Ambulatory Visit | Attending: Pulmonary Disease | Admitting: Pulmonary Disease

## 2013-06-21 DIAGNOSIS — Z1231 Encounter for screening mammogram for malignant neoplasm of breast: Secondary | ICD-10-CM | POA: Insufficient documentation

## 2013-07-01 DIAGNOSIS — H269 Unspecified cataract: Secondary | ICD-10-CM | POA: Diagnosis not present

## 2013-07-01 DIAGNOSIS — H2510 Age-related nuclear cataract, unspecified eye: Secondary | ICD-10-CM | POA: Diagnosis not present

## 2013-07-02 DIAGNOSIS — H2510 Age-related nuclear cataract, unspecified eye: Secondary | ICD-10-CM | POA: Diagnosis not present

## 2013-07-22 DIAGNOSIS — H2510 Age-related nuclear cataract, unspecified eye: Secondary | ICD-10-CM | POA: Diagnosis not present

## 2013-07-22 DIAGNOSIS — H269 Unspecified cataract: Secondary | ICD-10-CM | POA: Diagnosis not present

## 2013-11-25 DIAGNOSIS — R42 Dizziness and giddiness: Secondary | ICD-10-CM | POA: Diagnosis not present

## 2013-11-25 DIAGNOSIS — J209 Acute bronchitis, unspecified: Secondary | ICD-10-CM | POA: Diagnosis not present

## 2013-11-25 DIAGNOSIS — R21 Rash and other nonspecific skin eruption: Secondary | ICD-10-CM | POA: Diagnosis not present

## 2013-12-05 DIAGNOSIS — Z0189 Encounter for other specified special examinations: Secondary | ICD-10-CM | POA: Diagnosis not present

## 2013-12-05 DIAGNOSIS — R300 Dysuria: Secondary | ICD-10-CM | POA: Diagnosis not present

## 2013-12-05 DIAGNOSIS — N390 Urinary tract infection, site not specified: Secondary | ICD-10-CM | POA: Diagnosis not present

## 2013-12-12 DIAGNOSIS — R112 Nausea with vomiting, unspecified: Secondary | ICD-10-CM | POA: Diagnosis not present

## 2013-12-12 DIAGNOSIS — R109 Unspecified abdominal pain: Secondary | ICD-10-CM | POA: Diagnosis not present

## 2013-12-12 DIAGNOSIS — K5900 Constipation, unspecified: Secondary | ICD-10-CM | POA: Diagnosis not present

## 2014-07-07 DIAGNOSIS — Z23 Encounter for immunization: Secondary | ICD-10-CM | POA: Diagnosis not present

## 2014-08-29 DIAGNOSIS — H26493 Other secondary cataract, bilateral: Secondary | ICD-10-CM | POA: Diagnosis not present

## 2014-11-17 DIAGNOSIS — R112 Nausea with vomiting, unspecified: Secondary | ICD-10-CM | POA: Diagnosis not present

## 2015-02-26 ENCOUNTER — Other Ambulatory Visit (HOSPITAL_COMMUNITY): Payer: Self-pay | Admitting: Pulmonary Disease

## 2015-02-26 DIAGNOSIS — E039 Hypothyroidism, unspecified: Secondary | ICD-10-CM | POA: Diagnosis not present

## 2015-02-26 DIAGNOSIS — I10 Essential (primary) hypertension: Secondary | ICD-10-CM | POA: Diagnosis not present

## 2015-02-26 DIAGNOSIS — R109 Unspecified abdominal pain: Secondary | ICD-10-CM

## 2015-02-26 DIAGNOSIS — D649 Anemia, unspecified: Secondary | ICD-10-CM | POA: Diagnosis not present

## 2015-03-04 ENCOUNTER — Ambulatory Visit (HOSPITAL_COMMUNITY)
Admission: RE | Admit: 2015-03-04 | Discharge: 2015-03-04 | Disposition: A | Discharge status: Home / Self Care | Payer: Medicare Other | Source: Ambulatory Visit | Attending: Pulmonary Disease | Admitting: Pulmonary Disease

## 2015-03-04 DIAGNOSIS — K8020 Calculus of gallbladder without cholecystitis without obstruction: Secondary | ICD-10-CM | POA: Diagnosis not present

## 2015-03-04 DIAGNOSIS — R109 Unspecified abdominal pain: Secondary | ICD-10-CM | POA: Diagnosis not present

## 2015-03-04 DIAGNOSIS — K7689 Other specified diseases of liver: Secondary | ICD-10-CM | POA: Diagnosis not present

## 2015-03-31 ENCOUNTER — Encounter (HOSPITAL_COMMUNITY): Payer: Self-pay | Admitting: Emergency Medicine

## 2015-03-31 ENCOUNTER — Other Ambulatory Visit (HOSPITAL_COMMUNITY): Payer: Self-pay | Admitting: Nurse Practitioner

## 2015-03-31 ENCOUNTER — Emergency Department (HOSPITAL_COMMUNITY)
Admission: EM | Admit: 2015-03-31 | Discharge: 2015-04-01 | Disposition: A | Discharge status: Home / Self Care | Payer: Medicare Other | Attending: Emergency Medicine | Admitting: Emergency Medicine

## 2015-03-31 ENCOUNTER — Emergency Department (HOSPITAL_COMMUNITY): Payer: Medicare Other

## 2015-03-31 DIAGNOSIS — N390 Urinary tract infection, site not specified: Secondary | ICD-10-CM | POA: Insufficient documentation

## 2015-03-31 DIAGNOSIS — K7689 Other specified diseases of liver: Secondary | ICD-10-CM | POA: Diagnosis not present

## 2015-03-31 DIAGNOSIS — I10 Essential (primary) hypertension: Secondary | ICD-10-CM | POA: Diagnosis not present

## 2015-03-31 DIAGNOSIS — Z7982 Long term (current) use of aspirin: Secondary | ICD-10-CM | POA: Insufficient documentation

## 2015-03-31 DIAGNOSIS — R1030 Lower abdominal pain, unspecified: Secondary | ICD-10-CM | POA: Diagnosis present

## 2015-03-31 DIAGNOSIS — Z1231 Encounter for screening mammogram for malignant neoplasm of breast: Secondary | ICD-10-CM

## 2015-03-31 DIAGNOSIS — Z79899 Other long term (current) drug therapy: Secondary | ICD-10-CM | POA: Insufficient documentation

## 2015-03-31 DIAGNOSIS — D1803 Hemangioma of intra-abdominal structures: Secondary | ICD-10-CM | POA: Diagnosis not present

## 2015-03-31 LAB — COMPREHENSIVE METABOLIC PANEL
ALK PHOS: 54 U/L (ref 38–126)
ALT: 31 U/L (ref 14–54)
AST: 54 U/L — ABNORMAL HIGH (ref 15–41)
Albumin: 4.1 g/dL (ref 3.5–5.0)
Anion gap: 8 (ref 5–15)
BILIRUBIN TOTAL: 1.1 mg/dL (ref 0.3–1.2)
BUN: 19 mg/dL (ref 6–20)
CHLORIDE: 104 mmol/L (ref 101–111)
CO2: 29 mmol/L (ref 22–32)
Calcium: 9.2 mg/dL (ref 8.9–10.3)
Creatinine, Ser: 1.29 mg/dL — ABNORMAL HIGH (ref 0.44–1.00)
GFR calc non Af Amer: 40 mL/min — ABNORMAL LOW (ref >60)
GFR, EST AFRICAN AMERICAN: 46 mL/min — AB (ref >60)
Glucose, Bld: 131 mg/dL — ABNORMAL HIGH (ref 65–99)
Potassium: 3.6 mmol/L (ref 3.5–5.1)
SODIUM: 141 mmol/L (ref 135–145)
Total Protein: 7.6 g/dL (ref 6.5–8.1)

## 2015-03-31 LAB — CBC WITH DIFFERENTIAL/PLATELET
Basophils Absolute: 0 10*3/uL (ref 0.0–0.1)
Basophils Relative: 0 % (ref 0–1)
EOS PCT: 3 % (ref 0–5)
Eosinophils Absolute: 0.2 10*3/uL (ref 0.0–0.7)
HEMATOCRIT: 36.6 % (ref 36.0–46.0)
Hemoglobin: 12.1 g/dL (ref 12.0–15.0)
LYMPHS PCT: 23 % (ref 12–46)
Lymphs Abs: 1.2 10*3/uL (ref 0.7–4.0)
MCH: 29.2 pg (ref 26.0–34.0)
MCHC: 33.1 g/dL (ref 30.0–36.0)
MCV: 88.2 fL (ref 78.0–100.0)
MONO ABS: 0.4 10*3/uL (ref 0.1–1.0)
MONOS PCT: 8 % (ref 3–12)
Neutro Abs: 3.6 10*3/uL (ref 1.7–7.7)
Neutrophils Relative %: 66 % (ref 43–77)
Platelets: 187 10*3/uL (ref 150–400)
RBC: 4.15 MIL/uL (ref 3.87–5.11)
RDW: 14 % (ref 11.5–15.5)
WBC: 5.4 10*3/uL (ref 4.0–10.5)

## 2015-03-31 LAB — URINE MICROSCOPIC-ADD ON

## 2015-03-31 LAB — URINALYSIS, ROUTINE W REFLEX MICROSCOPIC
Bilirubin Urine: NEGATIVE
Glucose, UA: NEGATIVE mg/dL
KETONES UR: NEGATIVE mg/dL
Nitrite: NEGATIVE
PH: 6 (ref 5.0–8.0)
Protein, ur: NEGATIVE mg/dL
Specific Gravity, Urine: 1.02 (ref 1.005–1.030)
UROBILINOGEN UA: 4 mg/dL — AB (ref 0.0–1.0)

## 2015-03-31 LAB — LIPASE, BLOOD: LIPASE: 19 U/L — AB (ref 22–51)

## 2015-03-31 LAB — TROPONIN I: Troponin I: <0.03 ng/mL (ref <0.031)

## 2015-03-31 LAB — LACTIC ACID, PLASMA: LACTIC ACID, VENOUS: 1.3 mmol/L (ref 0.5–2.0)

## 2015-03-31 MED ORDER — MORPHINE SULFATE 4 MG/ML IJ SOLN
4.0000 mg | Freq: Once | INTRAMUSCULAR | Status: AC
  Administered 2015-03-31: 4 mg via INTRAVENOUS
  Filled 2015-03-31: qty 1

## 2015-03-31 MED ORDER — IOHEXOL 300 MG/ML  SOLN
50.0000 mL | Freq: Once | INTRAMUSCULAR | Status: AC | PRN
  Administered 2015-03-31: 50 mL via ORAL

## 2015-03-31 MED ORDER — ONDANSETRON HCL 4 MG/2ML IJ SOLN
4.0000 mg | Freq: Once | INTRAMUSCULAR | Status: AC
  Administered 2015-03-31: 4 mg via INTRAVENOUS
  Filled 2015-03-31: qty 2

## 2015-03-31 MED ORDER — SODIUM CHLORIDE 0.9 % IV BOLUS (SEPSIS)
1000.0000 mL | Freq: Once | INTRAVENOUS | Status: AC
  Administered 2015-03-31: 1000 mL via INTRAVENOUS

## 2015-03-31 MED ORDER — CEFTRIAXONE SODIUM 1 G IJ SOLR
1.0000 g | Freq: Once | INTRAMUSCULAR | Status: AC
  Administered 2015-04-01: 1 g via INTRAVENOUS
  Filled 2015-03-31: qty 10

## 2015-03-31 MED ORDER — IOHEXOL 300 MG/ML  SOLN
100.0000 mL | Freq: Once | INTRAMUSCULAR | Status: AC | PRN
  Administered 2015-03-31: 100 mL via INTRAVENOUS

## 2015-03-31 NOTE — ED Notes (Signed)
Pt. Reports intermittent abdominal pain. Pt. Reports being diagnosed with gall stones by Dr. Luan Pulling.  Pt. To see Dr. Arnoldo Morale next Thursday for surgery consult. Pt. C/o nausea.

## 2015-03-31 NOTE — ED Provider Notes (Signed)
CSN: [id number]     Arrival date & time 03/31/15  2007 History  This chart was scribed for Nat Christen, MD by Helane Gunther, ED Scribe. This patient was seen in room APA07/AP[facility]07 and the patient's care was started at 9:08 PM.     Chief Complaint  Patient presents with  . Abdominal Pain   The history is provided by the patient. No language interpreter was used.   HPI Comments: Kelcie Currie is a 74 y.o. female with a PMHx of Algis Liming presents to the Emergency Department complaining of intermittent, bilateral lower abdominal pain onset 4 hours ago. She reports associated nausea and urgency (GI). Pt states that she has been experiencing abdominal pain in the RUQ for the past 5 months and was seen by Dr Luan Pulling, who performed an [place] and diagnosed her with Gallstones. However, she denies current pain in the RUQ. Her last PO was 8-9 hours ago. She has a PMHx of HTN and Hypothyroidism. Per daughter the pt looks ill.  Pt denies dysuria, and difficulty urinating.  Past Medical History  Diagnosis Date  . Hypertension    Past Surgical History  Procedure Laterality Date  . Vesicovaginal fistula closure w/ tah     No family history on file. History  Substance Use Topics  . Smoking status: Never Smoker   . Smokeless tobacco: Not on file  . Alcohol Use: No   OB History    No data available     Review of Systems  Gastrointestinal: Positive for nausea and abdominal pain. Negative for vomiting.  Genitourinary: Negative for dysuria.  All other systems reviewed and are negative.   Allergies  Codeine  Home Medications   Prior to Admission medications   Medication Sig Start Date End Date Taking? Authorizing Provider  aspirin EC 81 MG tablet Take 81 mg by mouth daily.   Yes Historical Provider, MD  cholecalciferol (VITAMIN D) 1000 UNITS tablet Take 1,000 Units by mouth daily.   Yes Historical Provider, MD  estradiol (CLIMARA - DOSED IN MG/24 HR) 0.0375 mg/24hr patch Place 0.0375 mg onto the  skin once a week.   Yes Historical Provider, MD  ferrous sulfate 325 (65 FE) MG tablet Take 325 mg by mouth daily with breakfast.   Yes Historical Provider, MD  levothyroxine (SYNTHROID, LEVOTHROID) 88 MCG tablet Take 88 mcg by mouth daily before breakfast.   Yes Historical Provider, MD  nystatin-triamcinolone (MYCOLOG II) cream Apply 1 application topically 2 (two) times daily.   Yes Historical Provider, MD  olmesartan-hydrochlorothiazide (BENICAR HCT) 40-12.5 MG per tablet Take 1 tablet by mouth daily.   Yes Historical Provider, MD  vitamin B-12 (CYANOCOBALAMIN) 100 MCG tablet Take 100 mcg by mouth daily.   Yes Historical Provider, MD  nitrofurantoin, macrocrystal-monohydrate, (MACROBID) 100 MG capsule Take 1 capsule (100 mg total) by mouth 2 (two) times daily. 04/01/15   Nat Christen, MD  ondansetron (ZOFRAN) 8 MG tablet Take 1 tablet (8 mg total) by mouth every 4 (four) hours as needed. 04/01/15   Nat Christen, MD  oxyCODONE-acetaminophen (PERCOCET/ROXICET) 5-325 MG per tablet Take 1-2 tablets by mouth every 6 (six) hours as needed. 04/01/15   Nat Christen, MD   BP 100/76 mmHg  Pulse 71  Temp(Src) 98.4 F (36.9 C) (Oral)  Resp 13  Ht 5\' 5"  (1.651 m)  Wt 167 lb (75.751 kg)  BMI 27.79 kg/m2  SpO2 97% Physical Exam  Constitutional: She is oriented to person, place, and time. She appears well-developed and well-nourished. No  distress.  HENT:  Head: Normocephalic and atraumatic.  Mouth/Throat: Oropharynx is clear and moist.  Eyes: Conjunctivae and EOM are normal. Pupils are equal, round, and reactive to light.  Neck: Normal range of motion. Neck supple. No tracheal deviation present.  Cardiovascular: Normal rate and regular rhythm.   Pulmonary/Chest: Effort normal and breath sounds normal. No respiratory distress.  Abdominal: There is tenderness.  More tender in the lower abdomen, bilaterally  Musculoskeletal: Normal range of motion.  Neurological: She is alert and oriented to person, place, and  time.  Skin: Skin is warm and dry.  Psychiatric: She has a normal mood and affect. Her behavior is normal.  Nursing note and vitals reviewed.   ED Course  Procedures  DIAGNOSTIC STUDIES: Oxygen Saturation is 100% on RA, normal by my interpretation.    COORDINATION OF CARE: 9:15 PM - Discussed plans to order a CT as well as anti-nausea and pain medication. Pt advised of plan for treatment and pt agrees.  Labs Review Labs Reviewed  COMPREHENSIVE METABOLIC PANEL - Abnormal; Notable for the following:    Glucose, Bld 131 (*)    Creatinine, Ser 1.29 (*)    AST 54 (*)    GFR calc non Af Amer 40 (*)    GFR calc Af Amer 46 (*)    All other components within normal limits  LIPASE, BLOOD - Abnormal; Notable for the following:    Lipase 19 (*)    All other components within normal limits  URINALYSIS, ROUTINE W REFLEX MICROSCOPIC (NOT AT [facility]) - Abnormal; Notable for the following:    APPearance HAZY (*)    Hgb urine dipstick TRACE (*)    Urobilinogen, UA 4.0 (*)    Leukocytes, UA SMALL (*)    All other components within normal limits  URINE MICROSCOPIC-ADD ON - Abnormal; Notable for the following:    Bacteria, UA MANY (*)    All other components within normal limits  URINE CULTURE  TROPONIN I  LACTIC ACID, PLASMA  CBC WITH DIFFERENTIAL/PLATELET    Imaging Review Ct Abdomen Pelvis W Contrast  03/31/2015   CLINICAL DATA:  Intermittent bilateral lower abdominal pain for 4 hours. Associated nausea and urgency. Right upper quadrant abdominal pain for 5 months and has been seen by Dr. Luan Pulling who diagnosed her with gallstones.  EXAM: CT ABDOMEN AND PELVIS WITH CONTRAST  TECHNIQUE: Multidetector CT imaging of the abdomen and pelvis was performed using the standard protocol following bolus administration of intravenous contrast.  CONTRAST:  61mL OMNIPAQUE IOHEXOL 300 MG/ML SOLN, 160mL OMNIPAQUE IOHEXOL 300 MG/ML SOLN  COMPARISON:  None.  FINDINGS: Mild dependent atelectasis in the lung bases.   Multiple circumscribed nonenhancing low-attenuation lesions throughout the liver, largest measuring about 4 x 5.5 cm. These are consistent with cysts. 2.7 cm diameter lesion in the lateral segment left lobe of the liver with peripheral enhancement consistent with hemangioma. Gallbladder appears normal. No radiopaque stones or wall thickening are identified. No bile duct dilatation. The pancreas, spleen, adrenal glands, kidneys, abdominal aorta, inferior vena cava, and retroperitoneal lymph nodes are unremarkable. Stomach, small bowel, and colon are not abnormally distended. Stool fills the colon. No free air or free fluid in the abdomen. Abdominal wall musculature appears intact.  Pelvis: Uterus is surgically absent. Bladder wall is not thickened. No free or loculated pelvic fluid collections. No pelvic mass or lymphadenopathy. Appendix is not identified. No destructive bone lesions.  IMPRESSION: Polycystic liver. Cavernous hemangioma in the liver. Gallbladder is unremarkable. No bile  duct dilatation. No evidence of bowel obstruction or inflammation.   Electronically Signed   By: Lucienne Capers M.D.   On: 03/31/2015 23:30     EKG Interpretation   Date/Time:  Tuesday March 31 2015 21:13:41 EDT Ventricular Rate:  58 PR Interval:  185 QRS Duration: 90 QT Interval:  460 QTC Calculation: 452 R Axis:   4 Text Interpretation:  Sinus rhythm Nonspecific T abnormalities, lateral  leads Baseline wander in lead(s) V1 Confirmed by Ilaria Much  MD, Mariame Rybolt ([id number])  on 03/31/2015 10:25:43 PM      MDM   Final diagnoses:  UTI (lower urinary tract infection)    No acute abdomen. CT scan shows polycystic liver and cavernous hemangioma in the liver. Gallbladder is unremarkable. Urinalysis shows evidence of infection. IV Rocephin. Urine culture. Discharge medications Macrobid, Percocet, Zofran 8 mg.  I personally performed the services described in this documentation, which was scribed in my presence. The recorded  information has been reviewed and is accurate.    Nat Christen, MD 04/01/15 [phone]

## 2015-04-01 MED ORDER — NITROFURANTOIN MONOHYD MACRO 100 MG PO CAPS
100.0000 mg | ORAL_CAPSULE | Freq: Two times a day (BID) | ORAL | Status: DC
Start: 2015-04-01 — End: 2015-11-25

## 2015-04-01 MED ORDER — ONDANSETRON HCL 8 MG PO TABS: 8.0000 mg | ORAL_TABLET | ORAL | Status: DC | PRN

## 2015-04-01 MED ORDER — OXYCODONE-ACETAMINOPHEN 5-325 MG PO TABS: 1.0000 | ORAL_TABLET | Freq: Four times a day (QID) | ORAL | Status: DC | PRN

## 2015-04-01 NOTE — Discharge Instructions (Signed)
You have a urinary tract infection. Increase fluids. Prescription for antibiotic, pain medicine, nausea medicine. Follow-up your primary care doctor or general surgeon.

## 2015-04-03 LAB — URINE CULTURE

## 2015-04-07 ENCOUNTER — Ambulatory Visit (HOSPITAL_COMMUNITY)
Admission: RE | Admit: 2015-04-07 | Discharge: 2015-04-07 | Disposition: A | Discharge status: Home / Self Care | Payer: Medicare Other | Source: Ambulatory Visit | Attending: Nurse Practitioner | Admitting: Nurse Practitioner

## 2015-04-07 ENCOUNTER — Encounter (HOSPITAL_COMMUNITY): Payer: Self-pay | Admitting: *Deleted

## 2015-04-07 DIAGNOSIS — Z1231 Encounter for screening mammogram for malignant neoplasm of breast: Secondary | ICD-10-CM

## 2015-04-09 ENCOUNTER — Encounter (HOSPITAL_COMMUNITY)
Admission: RE | Admit: 2015-04-09 | Discharge: 2015-04-09 | Disposition: A | Discharge status: Home / Self Care | Payer: Medicare Other | Source: Ambulatory Visit | Attending: General Surgery | Admitting: General Surgery

## 2015-04-09 ENCOUNTER — Encounter (HOSPITAL_COMMUNITY): Payer: Self-pay

## 2015-04-09 DIAGNOSIS — K8050 Calculus of bile duct without cholangitis or cholecystitis without obstruction: Secondary | ICD-10-CM | POA: Diagnosis not present

## 2015-04-09 DIAGNOSIS — K8020 Calculus of gallbladder without cholecystitis without obstruction: Secondary | ICD-10-CM | POA: Diagnosis not present

## 2015-04-09 DIAGNOSIS — Z01818 Encounter for other preprocedural examination: Secondary | ICD-10-CM | POA: Diagnosis not present

## 2015-04-09 HISTORY — DX: Anemia, unspecified: D64.9

## 2015-04-09 HISTORY — DX: Deficiency of other specified B group vitamins: E53.8

## 2015-04-09 HISTORY — DX: Sleep apnea, unspecified: G47.30

## 2015-04-09 MED ORDER — CHLORHEXIDINE GLUCONATE 4 % EX LIQD: 1.0000 "application " | Freq: Once | CUTANEOUS | Status: DC

## 2015-04-09 NOTE — Patient Instructions (Addendum)
    MURANDA COYE  04/09/2015     @PREFPERIOPPHARMACY @   Your procedure is scheduled on 04/15/2015.  Report to [facility] at 8:20 A.M.  Call this number if you have problems the morning of surgery:  [phone]   Remember:  Do not eat food or drink liquids after midnight.  Take these medicines the morning of surgery with A SIP OF WATER :  Percocet, Ferous Sulfate, Synthroid, Benicar HCT and Zofran   Do not wear jewelry, make-up or nail polish.  Do not wear lotions, powders, or perfumes.  You may wear deodorant.  Do not shave 48 hours prior to surgery.  Men may shave face and neck.  Do not bring valuables to the hospital.  [facility] is not responsible for any belongings or valuables.  Contacts, dentures or bridgework may not be worn into surgery.  Leave your suitcase in the car.  After surgery it may be brought to your room.  For patients admitted to the hospital, discharge time will be determined by your treatment team.  Patients discharged the day of surgery will not be allowed to drive home.   Name and phone number of your driver:   family Special instructions:  n/a  Please read over the following fact sheets that you were given. Care and Recovery After Surgery      Laparoscopic Cholecystectomy, Care After These instructions give you information on caring for yourself after your procedure. Your doctor may also give you more specific instructions. Call your doctor if you have any problems or questions after your procedure.  HOME CARE  Change your bandages (dressings) as told by your doctor.  Keep the wound dry and clean. Wash the wound gently with soap and water. Pat the wound dry with a clean towel.  Do not take baths, swim, or use hot tubs for 2 weeks, or as told by your doctor.  Only take medicine as told by your doctor.  Eat a normal diet as told by your doctor.  Do not lift anything heavier than 10 pounds (4.5 kg) until your doctor says it is okay.  Do not  play contact sports for 1 week, or as told by your doctor. GET HELP IF:  Your wound is red, puffy (swollen), or painful.  You have yellowish-white fluid (pus) coming from the wound.  You have fluid draining from the wound for more than 1 day.  You have a bad smell coming from the wound.  Your wound breaks open. GET HELP RIGHT AWAY IF:   You have a rash.  You have trouble breathing.  You have chest pain.  You have a fever.  You have pain in the shoulders (shoulder strap areas) that is getting worse.  You feel dizzy or pass out (faint).  You have severe belly (abdominal) pain.  You feel sick to your stomach (nauseous) or throw up (vomit) for more than 1 day. Document Released: 06/14/2008 Document Revised: 06/26/2013 Document Reviewed: 04/17/2013 [facility] Patient Information 2015 [place], [place]. This information is not intended to replace advice given to you by your health care provider. Make sure you discuss any questions you have with your health care provider.

## 2015-04-09 NOTE — Progress Notes (Signed)
   04/09/15 1118  OBSTRUCTIVE SLEEP APNEA  Have you ever been diagnosed with sleep apnea through a sleep study? No  Do you snore loudly (loud enough to be heard through closed doors)?  1  Do you often feel tired, fatigued, or sleepy during the daytime? 0  Has anyone observed you stop breathing during your sleep? 1  Do you have, or are you being treated for high blood pressure? 1  BMI more than 35 kg/m2? 0  Age over 74 years old? 1  Neck circumference greater than 40 cm/16 inches? 0  Gender: 0  Obstructive Sleep Apnea Score 4

## 2015-04-10 NOTE — H&P (Signed)
  NTS SOAP Note  Vital Signs:  Vitals as of: 12/19/270: Systolic 536: Diastolic 79: Heart Rate 62: Temp 97.36F: Height 67ft 5.5in: Weight 158Lbs 0 Ounces: BMI 25.89  BMI : 25.89 kg/m2  Subjective: This 74 year old female presents for of cholelithiasis.  Has been having intermittent episodes of right upper quadrant abdominal pain with radiation to the right flank, nausea, and food intolerance for some time now.  No fever, chills, jaundice.   Review of Symptoms: unremarkable   Head:unremarkable Eyes:unremarkable   Nose/Mouth/Throat:unremarkable Cardiovascular:  unremarkable Respiratory:unremarkable Gastrointestinabdominal pain, nausea Genitourinary:unremarkable   Musculoskeletal:unremarkable Skin:unremarkable Hematolgic/Lymphatic:unremarkable   Allergic/Immunologic:unremarkable   Past Medical History:  Reviewed  Past Medical History  Surgical History: TAH Medical Problems: hypothyroidism, HTN Allergies: codeine Medications: synthroid, benicar, climara, asa   Social History:Reviewed  Social History  Preferred Language: English Race:  Black or African American Ethnicity: Not Hispanic / Latino Age: 39 year Marital Status:  M Alcohol: no   Smoking Status: Unknown if ever smoked Functional Status reviewed on 04/09/2015 ------------------------------------------------ Bathing: Normal Cooking: Normal Dressing: Normal Driving: Normal Eating: Normal Managing Meds: Normal Oral Care: Normal Shopping: Normal Toileting: Normal Transferring: Normal Walking: Normal Cognitive Status reviewed on 04/09/2015 ------------------------------------------------ Attention: Normal Decision Making: Normal Language: Normal Memory: Normal Motor: Normal Perception: Normal Problem Solving: Normal Visual and Spatial: Normal   Family History:Reviewed  Family Health History Family History is Unknown    Objective Information: General:Well appearing,  well nourished in no distress. Heart:RRR, no murmur Lungs:  CTA bilaterally, no wheezes, rhonchi, rales.  Breathing unlabored. Abdomen:Soft, NT/ND, no HSM, no masses. U/S shows cholelithiasis, normal common bile duct Assessment:Cholelithiasis, biliary colic  Diagnoses: 644.03  K80.20 Gallstone (Calculus of gallbladder without cholecystitis without obstruction)  Procedures: 47425 - OFFICE OUTPATIENT NEW 30 MINUTES    Plan:  Scheduled for laparoscopic cholecystectomy on 04/15/15.   Patient Education:Alternative treatments to surgery were discussed with patient (and family).  Risks and benefits  of procedure including bleeding, infection, hepatobiliary injury, and the possibility of an open procedure were fully explained to the patient (and family) who gave informed consent. Patient/family questions were addressed.  Follow-up:Pending Surgery

## 2015-04-13 DIAGNOSIS — R05 Cough: Secondary | ICD-10-CM | POA: Diagnosis not present

## 2015-04-15 ENCOUNTER — Ambulatory Visit (HOSPITAL_COMMUNITY): Payer: Medicare Other | Admitting: Anesthesiology

## 2015-04-15 ENCOUNTER — Encounter (HOSPITAL_COMMUNITY): Payer: Self-pay | Admitting: *Deleted

## 2015-04-15 ENCOUNTER — Ambulatory Visit (HOSPITAL_COMMUNITY)
Admission: RE | Admit: 2015-04-15 | Discharge: 2015-04-15 | Disposition: A | Discharge status: Home / Self Care | Payer: Medicare Other | Source: Ambulatory Visit | Attending: General Surgery | Admitting: General Surgery

## 2015-04-15 ENCOUNTER — Encounter (HOSPITAL_COMMUNITY)
Admission: RE | Disposition: A | Discharge status: Home / Self Care | Payer: Self-pay | Source: Ambulatory Visit | Attending: General Surgery

## 2015-04-15 DIAGNOSIS — I10 Essential (primary) hypertension: Secondary | ICD-10-CM | POA: Diagnosis not present

## 2015-04-15 DIAGNOSIS — G4730 Sleep apnea, unspecified: Secondary | ICD-10-CM | POA: Diagnosis not present

## 2015-04-15 DIAGNOSIS — E039 Hypothyroidism, unspecified: Secondary | ICD-10-CM | POA: Diagnosis not present

## 2015-04-15 DIAGNOSIS — Z79890 Hormone replacement therapy (postmenopausal): Secondary | ICD-10-CM | POA: Insufficient documentation

## 2015-04-15 DIAGNOSIS — Z79899 Other long term (current) drug therapy: Secondary | ICD-10-CM | POA: Insufficient documentation

## 2015-04-15 DIAGNOSIS — K8020 Calculus of gallbladder without cholecystitis without obstruction: Secondary | ICD-10-CM | POA: Insufficient documentation

## 2015-04-15 DIAGNOSIS — Z7982 Long term (current) use of aspirin: Secondary | ICD-10-CM | POA: Insufficient documentation

## 2015-04-15 DIAGNOSIS — K8010 Calculus of gallbladder with chronic cholecystitis without obstruction: Secondary | ICD-10-CM | POA: Diagnosis not present

## 2015-04-15 HISTORY — PX: CHOLECYSTECTOMY: SHX55

## 2015-04-15 LAB — HEPATIC FUNCTION PANEL
ALK PHOS: 58 U/L (ref 38–126)
ALT: 14 U/L (ref 14–54)
AST: 18 U/L (ref 15–41)
Albumin: 3.8 g/dL (ref 3.5–5.0)
BILIRUBIN INDIRECT: 0.5 mg/dL (ref 0.3–0.9)
Bilirubin, Direct: 0.1 mg/dL (ref 0.1–0.5)
Total Bilirubin: 0.6 mg/dL (ref 0.3–1.2)
Total Protein: 6.8 g/dL (ref 6.5–8.1)

## 2015-04-15 SURGERY — LAPAROSCOPIC CHOLECYSTECTOMY
Anesthesia: General | Site: Abdomen

## 2015-04-15 MED ORDER — LIDOCAINE HCL (PF) 1 % IJ SOLN
INTRAMUSCULAR | Status: AC
  Filled 2015-04-15: qty 5

## 2015-04-15 MED ORDER — POVIDONE-IODINE 10 % OINT PACKET
TOPICAL_OINTMENT | CUTANEOUS | Status: DC | PRN
  Administered 2015-04-15: 1 via TOPICAL

## 2015-04-15 MED ORDER — ONDANSETRON HCL 4 MG/2ML IJ SOLN
INTRAMUSCULAR | Status: AC
  Filled 2015-04-15: qty 2

## 2015-04-15 MED ORDER — FENTANYL CITRATE (PF) 100 MCG/2ML IJ SOLN
INTRAMUSCULAR | Status: DC | PRN
Start: 2015-04-15 — End: 2015-04-15
  Administered 2015-04-15: 50 ug via INTRAVENOUS
  Administered 2015-04-15: 25 ug via INTRAVENOUS

## 2015-04-15 MED ORDER — GLYCOPYRROLATE 0.2 MG/ML IJ SOLN
INTRAMUSCULAR | Status: AC
  Filled 2015-04-15: qty 1

## 2015-04-15 MED ORDER — GLYCOPYRROLATE 0.2 MG/ML IJ SOLN
INTRAMUSCULAR | Status: AC
  Filled 2015-04-15: qty 3

## 2015-04-15 MED ORDER — HEMOSTATIC AGENTS (NO CHARGE) OPTIME
TOPICAL | Status: DC | PRN
  Administered 2015-04-15: 1 via TOPICAL

## 2015-04-15 MED ORDER — GLYCOPYRROLATE 0.2 MG/ML IJ SOLN
INTRAMUSCULAR | Status: DC | PRN
  Administered 2015-04-15: 0.2 mg via INTRAVENOUS
  Administered 2015-04-15: .8 mg via INTRAVENOUS

## 2015-04-15 MED ORDER — SODIUM CHLORIDE 0.9 % IR SOLN
Status: DC | PRN
  Administered 2015-04-15: 500 mL

## 2015-04-15 MED ORDER — PROPOFOL 10 MG/ML IV BOLUS
INTRAVENOUS | Status: AC
  Filled 2015-04-15: qty 20

## 2015-04-15 MED ORDER — LACTATED RINGERS IV SOLN
INTRAVENOUS | Status: DC
  Administered 2015-04-15: 09:00:00 via INTRAVENOUS

## 2015-04-15 MED ORDER — DEXAMETHASONE SODIUM PHOSPHATE 4 MG/ML IJ SOLN
INTRAMUSCULAR | Status: AC
  Filled 2015-04-15: qty 1

## 2015-04-15 MED ORDER — CHLORHEXIDINE GLUCONATE 4 % EX LIQD: 1.0000 "application " | Freq: Once | CUTANEOUS | Status: DC

## 2015-04-15 MED ORDER — ONDANSETRON HCL 4 MG/2ML IJ SOLN
INTRAMUSCULAR | Status: AC
  Filled 2015-04-15: qty 4

## 2015-04-15 MED ORDER — OXYCODONE-ACETAMINOPHEN 5-325 MG PO TABS: 1.0000 | ORAL_TABLET | Freq: Four times a day (QID) | ORAL | Status: DC | PRN

## 2015-04-15 MED ORDER — ONDANSETRON HCL 4 MG/2ML IJ SOLN: 4.0000 mg | Freq: Once | INTRAMUSCULAR | Status: DC | PRN

## 2015-04-15 MED ORDER — BUPIVACAINE HCL (PF) 0.5 % IJ SOLN
INTRAMUSCULAR | Status: DC | PRN
  Administered 2015-04-15: 9 mL

## 2015-04-15 MED ORDER — DEXAMETHASONE SODIUM PHOSPHATE 4 MG/ML IJ SOLN
4.0000 mg | Freq: Once | INTRAMUSCULAR | Status: AC
  Administered 2015-04-15: 4 mg via INTRAVENOUS

## 2015-04-15 MED ORDER — ROCURONIUM BROMIDE 50 MG/5ML IV SOLN
INTRAVENOUS | Status: AC
  Filled 2015-04-15: qty 1

## 2015-04-15 MED ORDER — BUPIVACAINE HCL (PF) 0.5 % IJ SOLN
INTRAMUSCULAR | Status: AC
  Filled 2015-04-15: qty 30

## 2015-04-15 MED ORDER — KETOROLAC TROMETHAMINE 30 MG/ML IJ SOLN
INTRAMUSCULAR | Status: AC
  Filled 2015-04-15: qty 1

## 2015-04-15 MED ORDER — ONDANSETRON HCL 4 MG/2ML IJ SOLN
4.0000 mg | Freq: Once | INTRAMUSCULAR | Status: AC
  Administered 2015-04-15: 4 mg via INTRAVENOUS

## 2015-04-15 MED ORDER — ROCURONIUM BROMIDE 100 MG/10ML IV SOLN
INTRAVENOUS | Status: DC | PRN
  Administered 2015-04-15 (×2): 5 mg via INTRAVENOUS
  Administered 2015-04-15: 25 mg via INTRAVENOUS

## 2015-04-15 MED ORDER — NEOSTIGMINE METHYLSULFATE 10 MG/10ML IV SOLN
INTRAVENOUS | Status: DC | PRN
  Administered 2015-04-15: 1 mg via INTRAVENOUS
  Administered 2015-04-15: 4 mg via INTRAVENOUS

## 2015-04-15 MED ORDER — KETOROLAC TROMETHAMINE 30 MG/ML IJ SOLN: 30.0000 mg | Freq: Once | INTRAMUSCULAR | Status: DC

## 2015-04-15 MED ORDER — LIDOCAINE HCL (CARDIAC) 10 MG/ML IV SOLN
INTRAVENOUS | Status: DC | PRN
  Administered 2015-04-15: 50 mg via INTRAVENOUS

## 2015-04-15 MED ORDER — CIPROFLOXACIN IN D5W 400 MG/200ML IV SOLN
INTRAVENOUS | Status: AC
Start: 2015-04-15 — End: 2015-04-15
  Filled 2015-04-15: qty 200

## 2015-04-15 MED ORDER — POVIDONE-IODINE 10 % EX OINT
TOPICAL_OINTMENT | CUTANEOUS | Status: AC
  Filled 2015-04-15: qty 1

## 2015-04-15 MED ORDER — FENTANYL CITRATE (PF) 100 MCG/2ML IJ SOLN
25.0000 ug | INTRAMUSCULAR | Status: DC | PRN
  Administered 2015-04-15 (×3): 50 ug via INTRAVENOUS
  Filled 2015-04-15 (×2): qty 2

## 2015-04-15 MED ORDER — PROPOFOL 10 MG/ML IV BOLUS
INTRAVENOUS | Status: DC | PRN
  Administered 2015-04-15: 130 mg via INTRAVENOUS
  Administered 2015-04-15: 20 mg via INTRAVENOUS

## 2015-04-15 MED ORDER — MIDAZOLAM HCL 2 MG/2ML IJ SOLN
INTRAMUSCULAR | Status: AC
  Filled 2015-04-15: qty 2

## 2015-04-15 MED ORDER — NEOSTIGMINE METHYLSULFATE 10 MG/10ML IV SOLN
INTRAVENOUS | Status: AC
  Filled 2015-04-15: qty 1

## 2015-04-15 MED ORDER — FENTANYL CITRATE (PF) 250 MCG/5ML IJ SOLN
INTRAMUSCULAR | Status: AC
  Filled 2015-04-15: qty 5

## 2015-04-15 MED ORDER — CIPROFLOXACIN IN D5W 400 MG/200ML IV SOLN
400.0000 mg | INTRAVENOUS | Status: AC
  Administered 2015-04-15 (×2): 400 mg via INTRAVENOUS

## 2015-04-15 MED ORDER — MIDAZOLAM HCL 2 MG/2ML IJ SOLN
1.0000 mg | INTRAMUSCULAR | Status: DC | PRN
  Administered 2015-04-15: 2 mg via INTRAVENOUS

## 2015-04-15 SURGICAL SUPPLY — 42 items
APPLIER CLIP LAPSCP 10X32 DD (CLIP) ×3 IMPLANT
BAG HAMPER (MISCELLANEOUS) ×3 IMPLANT
BAG SPEC RTRVL LRG 6X4 10 (ENDOMECHANICALS) ×1
CHLORAPREP W/TINT 26ML (MISCELLANEOUS) ×3 IMPLANT
CLOTH BEACON ORANGE TIMEOUT ST (SAFETY) ×3 IMPLANT
COVER LIGHT HANDLE STERIS (MISCELLANEOUS) ×6 IMPLANT
DECANTER SPIKE VIAL GLASS SM (MISCELLANEOUS) ×3 IMPLANT
ELECT REM PT RETURN 9FT ADLT (ELECTROSURGICAL) ×3
ELECTRODE REM PT RTRN 9FT ADLT (ELECTROSURGICAL) ×1 IMPLANT
FILTER SMOKE EVAC LAPAROSHD (FILTER) ×3 IMPLANT
FORMALIN 10 PREFIL 120ML (MISCELLANEOUS) ×3 IMPLANT
GLOVE ECLIPSE 6.5 STRL STRAW (GLOVE) ×4 IMPLANT
GLOVE INDICATOR 7.0 STRL GRN (GLOVE) ×6 IMPLANT
GLOVE SURG SS PI 7.5 STRL IVOR (GLOVE) ×3 IMPLANT
GOWN STRL REUS W/ TWL XL LVL3 (GOWN DISPOSABLE) ×1 IMPLANT
GOWN STRL REUS W/TWL LRG LVL3 (GOWN DISPOSABLE) ×6 IMPLANT
GOWN STRL REUS W/TWL XL LVL3 (GOWN DISPOSABLE) ×3
HEMOSTAT SNOW SURGICEL 2X4 (HEMOSTASIS) ×3 IMPLANT
INST SET LAPROSCOPIC AP (KITS) ×3 IMPLANT
IV NS IRRIG 3000ML ARTHROMATIC (IV SOLUTION) IMPLANT
KIT ROOM TURNOVER APOR (KITS) ×3 IMPLANT
MANIFOLD NEPTUNE II (INSTRUMENTS) ×3 IMPLANT
NDL INSUFFLATION 14GA 120MM (NEEDLE) ×1 IMPLANT
NEEDLE INSUFFLATION 14GA 120MM (NEEDLE) ×3 IMPLANT
NS IRRIG 1000ML POUR BTL (IV SOLUTION) ×3 IMPLANT
PACK LAP CHOLE LZT030E (CUSTOM PROCEDURE TRAY) ×3 IMPLANT
PAD ARMBOARD 7.5X6 YLW CONV (MISCELLANEOUS) ×3 IMPLANT
POUCH SPECIMEN RETRIEVAL 10MM (ENDOMECHANICALS) ×3 IMPLANT
SET BASIN LINEN APH (SET/KITS/TRAYS/PACK) ×3 IMPLANT
SET TUBE IRRIG SUCTION NO TIP (IRRIGATION / IRRIGATOR) IMPLANT
SLEEVE ENDOPATH XCEL 5M (ENDOMECHANICALS) ×3 IMPLANT
SPONGE GAUZE 2X2 8PLY STER LF (GAUZE/BANDAGES/DRESSINGS) ×4
SPONGE GAUZE 2X2 8PLY STRL LF (GAUZE/BANDAGES/DRESSINGS) ×8 IMPLANT
STAPLER VISISTAT (STAPLE) ×3 IMPLANT
SUT VICRYL 0 UR6 27IN ABS (SUTURE) ×3 IMPLANT
TAPE CLOTH SURG 4X10 WHT LF (GAUZE/BANDAGES/DRESSINGS) ×2 IMPLANT
TROCAR ENDO BLADELESS 11MM (ENDOMECHANICALS) ×3 IMPLANT
TROCAR XCEL NON-BLD 5MMX100MML (ENDOMECHANICALS) ×3 IMPLANT
TROCAR XCEL UNIV SLVE 11M 100M (ENDOMECHANICALS) ×3 IMPLANT
TUBING INSUFFLATION (TUBING) ×3 IMPLANT
WARMER LAPAROSCOPE (MISCELLANEOUS) ×3 IMPLANT
YANKAUER SUCT 12FT TUBE ARGYLE (SUCTIONS) ×3 IMPLANT

## 2015-04-15 NOTE — Interval H&P Note (Signed)
History and Physical Interval Note:  04/15/2015 9:39 AM  Heather Cobb  has presented today for surgery, with the diagnosis of cholelithiasis  The various methods of treatment have been discussed with the patient and family. After consideration of risks, benefits and other options for treatment, the patient has consented to  Procedure(s): LAPAROSCOPIC CHOLECYSTECTOMY (N/A) as a surgical intervention .  The patient's history has been reviewed, patient examined, no change in status, stable for surgery.  I have reviewed the patient's chart and labs.  Questions were answered to the patient's satisfaction.     Aviva Signs A

## 2015-04-15 NOTE — Anesthesia Preprocedure Evaluation (Addendum)
Anesthesia Evaluation  Patient identified by MRN, date of birth, ID band Patient awake    Reviewed: Allergy & Precautions, NPO status , Patient's Chart, lab work & pertinent test results  Airway Mallampati: III  TM Distance: >3 FB     Dental  (+) Edentulous Upper, Dental Advisory Given   Pulmonary sleep apnea ,  breath sounds clear to auscultation        Cardiovascular hypertension, Pt. on medications Rhythm:Regular Rate:Normal     Neuro/Psych    GI/Hepatic negative GI ROS,   Endo/Other  Hypothyroidism   Renal/GU      Musculoskeletal   Abdominal   Peds  Hematology   Anesthesia Other Findings   Reproductive/Obstetrics                            Anesthesia Physical Anesthesia Plan  ASA: II  Anesthesia Plan: General   Post-op Pain Management:    Induction: Intravenous  Airway Management Planned: Oral ETT  Additional Equipment:   Intra-op Plan:   Post-operative Plan: Extubation in OR  Informed Consent: I have reviewed the patients History and Physical, chart, labs and discussed the procedure including the risks, benefits and alternatives for the proposed anesthesia with the patient or authorized representative who has indicated his/her understanding and acceptance.     Plan Discussed with:   Anesthesia Plan Comments:         Anesthesia Quick Evaluation

## 2015-04-15 NOTE — Op Note (Signed)
Patient:  Heather Cobb  DOB:  [birth year]  MRN:  [id number]   Preop Diagnosis:  Biliary colic, cholelithiasis  Postop Diagnosis:  Same  Procedure:  Laparoscopic cholecystectomy  Surgeon:  Aviva Signs, M.D.  Anes:  Gen. endotracheal  Indications:  Patient is a 74 year old black female who presents with biliary colic secondary to cholelithiasis. The risks and benefits of the procedure including bleeding, infection, hepatobiliary injury, and the possibility of an open procedure were fully explained to the patient, who gave informed consent.  Procedure note:  The patient was placed the supine position. After induction of general endotracheal anesthesia, the abdomen was prepped and draped using the usual sterile technique with DuraPrep. Surgical site confirmation was performed.  A supraumbilical incision was made down to the fascia. A Veress needle was introduced into the abdominal cavity and confirmation of placement was done using the saline drop test. The abdomen was then insufflated to 16 mmHg pressure. An 11 mm trocar was introduced into the abdominal cavity under direct visualization without difficulty. The patient was placed in reverse Trendelenburg position and an additional 11 mm trocar was placed the epigastric region. 5 mm trochars were placed in the right upper quadrant and right flank regions. Liver was inspected and noted to be within normal limits. The gallbladder was retracted in a dynamic fashion in order to expose the triangle of Calot. The cystic duct was first identified. Its junction to the infundibulum was fully identified. Endoclips placed proximally and distally on the cystic duct, and the cystic duct was divided. This was likewise done cystic artery. The gallbladder was freed away from the gallbladder fossa using Bovie electrocautery. The gallbladder was delivered through the epigastric trocar site using an Endo Catch bag. The gallbladder fossa was inspected and no abnormal  bleeding or bile leakage was noted. Surgicel was placed the gallbladder fossa. All fluid and air were then evacuated from the abdominal cavity prior to removal of the trochars.  All wounds were irrigated with normal saline. All wounds were injected with 0.5% Sensorcaine. The supraumbilical fascia as well as epigastric fascia were reapproximated using 0 Vicryl interrupted sutures. All skin incisions were closed using staples. Betadine ointment and dry sterile dressings were applied.  All tape and needle counts were correct the end of the procedure. Patient was extubated in the operating room and transferred to PACU in stable condition.  Complications:  None  EBL:  Minimal  Specimen:  Gallbladder

## 2015-04-15 NOTE — Discharge Instructions (Signed)

## 2015-04-15 NOTE — Anesthesia Procedure Notes (Signed)
Procedure Name: Intubation Date/Time: 04/15/2015 10:15 AM Performed by: Vista Deck Pre-anesthesia Checklist: Patient identified, Patient being monitored, Timeout performed, Emergency Drugs available and Suction available Patient Re-evaluated:Patient Re-evaluated prior to inductionOxygen Delivery Method: Circle System Utilized Preoxygenation: Pre-oxygenation with 100% oxygen Intubation Type: IV induction Ventilation: Mask ventilation without difficulty Laryngoscope Size: Mac and 3 Grade View: Grade II Tube type: Oral Tube size: 7.0 mm Number of attempts: 1 Airway Equipment and Method: Stylet and Oral airway Placement Confirmation: ETT inserted through vocal cords under direct vision,  positive ETCO2 and breath sounds checked- equal and bilateral Secured at: 22 cm Tube secured with: Tape Dental Injury: Teeth and Oropharynx as per pre-operative assessment

## 2015-04-15 NOTE — H&P (Signed)
Pt states she took hibiclense bath last night and this morning.

## 2015-04-15 NOTE — Transfer of Care (Signed)
Immediate Anesthesia Transfer of Care Note  Patient: Heather Cobb  Procedure(s) Performed: Procedure(s): LAPAROSCOPIC CHOLECYSTECTOMY (N/A)  Patient Location: PACU  Anesthesia Type:General  Level of Consciousness: sedated and patient cooperative  Airway & Oxygen Therapy: Patient Spontanous Breathing and non-rebreather face mask  Post-op Assessment: Report given to RN, Post -op Vital signs reviewed and stable and Patient moving all extremities  Post vital signs: Reviewed and stable      Complications: No apparent anesthesia complications

## 2015-04-15 NOTE — Anesthesia Postprocedure Evaluation (Signed)
  Anesthesia Post-op Note  Patient: Heather Cobb  Procedure(s) Performed: Procedure(s): LAPAROSCOPIC CHOLECYSTECTOMY (N/A)  Patient Location: PACU  Anesthesia Type:General  Level of Consciousness: awake, alert  and patient cooperative  Airway and Oxygen Therapy: Patient Spontanous Breathing  Post-op Pain: mild  Post-op Assessment: Post-op Vital signs reviewed, Patient's Cardiovascular Status Stable, Respiratory Function Stable and Patent Airway              Post-op Vital Signs: Reviewed and stable  Last Vitals:  Filed Vitals:   04/15/15 1200  BP: 129/71  Pulse: 51  Temp:   Resp: 13    Complications: No apparent anesthesia complications

## 2015-04-16 ENCOUNTER — Encounter (HOSPITAL_COMMUNITY): Payer: Self-pay | Admitting: General Surgery

## 2015-05-20 DIAGNOSIS — Z1211 Encounter for screening for malignant neoplasm of colon: Secondary | ICD-10-CM | POA: Diagnosis not present

## 2015-05-20 DIAGNOSIS — Z01419 Encounter for gynecological examination (general) (routine) without abnormal findings: Secondary | ICD-10-CM | POA: Diagnosis not present

## 2015-05-29 DIAGNOSIS — Z23 Encounter for immunization: Secondary | ICD-10-CM | POA: Diagnosis not present

## 2015-05-29 DIAGNOSIS — E039 Hypothyroidism, unspecified: Secondary | ICD-10-CM | POA: Diagnosis not present

## 2015-05-29 DIAGNOSIS — R109 Unspecified abdominal pain: Secondary | ICD-10-CM | POA: Diagnosis not present

## 2015-05-29 DIAGNOSIS — I10 Essential (primary) hypertension: Secondary | ICD-10-CM | POA: Diagnosis not present

## 2015-11-12 ENCOUNTER — Encounter: Payer: Self-pay | Admitting: General Surgery

## 2015-11-12 DIAGNOSIS — K921 Melena: Secondary | ICD-10-CM | POA: Diagnosis not present

## 2015-11-13 ENCOUNTER — Encounter: Payer: Self-pay | Admitting: General Surgery

## 2015-11-25 ENCOUNTER — Ambulatory Visit (INDEPENDENT_AMBULATORY_CARE_PROVIDER_SITE_OTHER): Payer: Medicare Other | Admitting: General Surgery

## 2015-11-25 ENCOUNTER — Encounter: Payer: Self-pay | Admitting: General Surgery

## 2015-11-25 VITALS — BP 118/66 | HR 70 | Resp 12 | Ht 65.0 in | Wt 155.0 lb

## 2015-11-25 DIAGNOSIS — K5900 Constipation, unspecified: Secondary | ICD-10-CM | POA: Diagnosis not present

## 2015-11-25 DIAGNOSIS — K625 Hemorrhage of anus and rectum: Secondary | ICD-10-CM

## 2015-11-25 MED ORDER — HYDROCORTISONE ACETATE 25 MG RE SUPP: 25.0000 mg | Freq: Two times a day (BID) | RECTAL | Status: DC

## 2015-11-25 NOTE — Progress Notes (Signed)
Patient ID: Heather Cobb, female   DOB: [birth year], 75 y.o.   MRN: [id number]  Chief Complaint  Patient presents with  . Rectal Bleeding    HPI Heather Cobb is a 75 y.o. female.  Here today for bright red rectal bleeding. She first noticed this about 2 months ago. She states it is not every day. She does admit to being constipated. Bowels move every other day with the use of Fleets suppositories.  She states red blood is noted on the outer surface of the stool as well as the toilet tissue.  She sttaes it started out light in volume that occasionally has been more pronounced. She denies having any abdominal or rectal pain. No pain with defecation. Last bleeding was Monday. She has tried miralax in the past. She does sit for a long period of time for a BM.  The patient was recently evaluated by Heather Cobb, M.D. for vasomotor symptoms. She was restarted on Climara transdermal estrogen patch with resolution of her symptoms.  The patient moved from [place] after the recent death of her husband about 18 months ago to live with her daughter.  The patient is accompanied today by her only child, a daughter, Heather Cobb, who was present for the interview and exam.  HPI     Past Medical History  Diagnosis Date  . Hypothyroid   . Hypertension   . Anemia   . Vitamin B 12 deficiency   . Sleep apnea     Stop Bang score of 4  . Vertigo   . Pyelonephritis     Past Surgical History  Procedure Laterality Date  . Abdominal hysterectomy    . Vesicovaginal fistula closure w/ tah    . Cholecystectomy N/A 04/15/2015    Procedure: LAPAROSCOPIC CHOLECYSTECTOMY;  Surgeon: Aviva Signs, MD;  Location: [facility];  Service: General;  Laterality: N/A;  . Colonoscopy  2012    Heather Cobb  . Upper gastrointestinal endoscopy  2014    Family History  Problem Relation Age of Onset  . Asthma Mother   . Hypertension Daughter   . Cancer Neg Hx     Social History Social History  Substance Use  Topics  . Smoking status: Never Smoker   . Smokeless tobacco: Never Used  . Alcohol Use: No    Allergies  Allergen Reactions  . Codeine Nausea Only and Other (See Comments)    fever    Current Outpatient Prescriptions  Medication Sig Dispense Refill  . aspirin EC 81 MG tablet Take 81 mg by mouth daily.    . cholecalciferol (VITAMIN D) 1000 UNITS tablet Take 1,000 Units by mouth daily.    Marland Kitchen docusate sodium (COLACE) 100 MG capsule Take 100 mg by mouth 2 (two) times daily.    Marland Kitchen estradiol (CLIMARA - DOSED IN MG/24 HR) 0.0375 mg/24hr patch Place 0.0375 mg onto the skin once a week.    . ferrous fumarate (HEMOCYTE - 106 MG FE) 325 (106 FE) MG TABS Take 1 tablet (106 mg of iron total) by mouth daily. 30 each 0  . levothyroxine (SYNTHROID, LEVOTHROID) 88 MCG tablet Take 88 mcg by mouth daily before breakfast.    . olmesartan-hydrochlorothiazide (BENICAR HCT) 40-12.5 MG per tablet Take 1 tablet by mouth daily.    . vitamin B-12 (CYANOCOBALAMIN) 100 MCG tablet Take 100 mcg by mouth daily.    . hydrocortisone (ANUSOL-HC) 25 MG suppository Place 1 suppository (25 mg total) rectally 2 (two) times daily. 12 suppository 0  No current facility-administered medications for this visit.    Review of Systems Review of Systems  Constitutional: Negative.   Respiratory: Negative.   Cardiovascular: Negative.   Gastrointestinal: Positive for constipation and blood in stool. Negative for nausea, abdominal pain and diarrhea.    Blood pressure 118/66, pulse 70, resp. rate 12, height 5\' 5"  (1.651 m), weight 155 lb (70.308 kg).  Physical Exam Physical Exam  Constitutional: She is oriented to person, place, and time. She appears well-developed and well-nourished.  HENT:  Mouth/Throat: Oropharynx is clear and moist.  Eyes: Conjunctivae are normal. No scleral icterus.  Neck: Neck supple.  Cardiovascular: Normal rate, regular rhythm and normal heart sounds.   Pulmonary/Chest: Effort normal and breath  sounds normal.  Abdominal: Soft. Bowel sounds are normal. There is no tenderness.  Genitourinary: Rectal exam shows internal hemorrhoid.  Lymphadenopathy:    She has no cervical adenopathy.  Neurological: She is alert and oriented to person, place, and time.  Skin: Skin is warm and dry.  Psychiatric: Her behavior is normal.  Anoscopy showed some mildly prominent internal hemorrhoidal tissue, no evidence of bleeding. No rectal masses.  Data Reviewed Records from Wilford Corner, M.D. in [place], [place] were reviewed. A request had been made for a color copy of her os recent endoscopy. Last recorded evaluation was June 2014. He saw the patient in consultation as the patient had been hospitalized at [facility] next hospital for 5 days with a diagnosis of anemia. At that time she was seen blood with wiping with constipation. He confirmed the history obtained today that she was using daily or every other day suppositories. Reference is made to 2012 colonoscopy reported showing internal hemorrhoids and diverticulosis. [name](?) 12/03/2010). Recommendation made for daily use of MiraLAX unless diarrhea developed. 02/01/2013 CT scan of the abdomen and pelvis completed for fever and right lower quadrant tenderness was suggestive of pyelonephritis. The appendix was not visualized.  Upper endoscopy dated 05/10/2013 showed normal small bowel mucosa. No evidence of H. pylori infection. Reactive gastropathy noted. Hiatal hernia identified during the procedure.  Office notes from [place] dated 11/12/2015 were reviewed. Stool Hemoccult on that visit was positive.  CBC completed at the time of her July 2016 cholecystectomy showed a normal hemoglobin and MCV.  Assessment    Rectal bleeding likely secondary to chronic constipation and long-term laxative use.  Resolution of vasomotor symptoms with reinstitution of estrogen supplement.    Plan    The patient reportedly does not  like to drink fluids, much to her daughter's frustration. She did not use the MiraLAX because it required drinking a glass of extra fluid. She's been asked to make use of fiber wafers daily, and the importance of drinking anything that she chooses, her preference being cranberry oral and use, encouraged. We'll make use of a short trial of medicated suppository to see if this relieves this present episode of bleeding. An attempt we made to track down the 2012 colonoscopy, although I think it's unlikely the patient will require repeat exam at this time, as no source is identified on today's exam this may be necessary    Recommend adding fiber supplement to her diet. RX sent for Anusol suppository. Follow up in 2-3 weeks.     PCP:  [place] Ref Dr Vernie Ammons  This information has been scribed by Karie [employer].     Robert Bellow 11/26/2015, 6:41 AM

## 2015-11-25 NOTE — Patient Instructions (Signed)
The patient is aware to call back for any questions or concerns. Recommend adding fiber supplement to her diet.

## 2015-11-26 DIAGNOSIS — K625 Hemorrhage of anus and rectum: Secondary | ICD-10-CM

## 2015-11-26 DIAGNOSIS — K5900 Constipation, unspecified: Secondary | ICD-10-CM | POA: Insufficient documentation

## 2015-11-26 HISTORY — DX: Hemorrhage of anus and rectum: K62.5

## 2015-12-07 ENCOUNTER — Encounter: Payer: Self-pay | Admitting: General Surgery

## 2015-12-07 NOTE — Progress Notes (Signed)
Colonoscopy report from 07/26/2005 showed sessile polyps in the rectum and transverse colon. The transverse colon lesion was an adenomatous polyp without high-grade dysplasia. Rectal polyps were hyperplastic. Lymphoid aggregates noted.  Colonoscopy dated 12/01/2010 was notable for a few smallmouth diverticula in the sigmoid colon. No Lana Polyps were noted.  EGD dated 05/10/2013 for iron deficiency anemia showed local mucosal abnormality with congestion, erythema and erosion in the gastric antrum and prepyloric areas.  Pathology from the 05/10/2013 upper endoscopy showed benign small bowel mucosa without evidence of inflammation or villous atrophy. There was no evidence of H. pylori infection.  CBC obtained at the time of the patient's cholecystectomy in July 2016 was normal.  She will be a candidate for repeat colonoscopy due to the finding of polyps at the time of her 2006 exam. This will be reviewed at the time of the patient's planned follow-up later this month.

## 2015-12-14 ENCOUNTER — Ambulatory Visit (INDEPENDENT_AMBULATORY_CARE_PROVIDER_SITE_OTHER): Payer: Medicare Other | Admitting: General Surgery

## 2015-12-14 ENCOUNTER — Encounter: Payer: Self-pay | Admitting: General Surgery

## 2015-12-14 VITALS — BP 130/76 | Resp 16 | Ht 65.0 in | Wt 157.0 lb

## 2015-12-14 DIAGNOSIS — Z1211 Encounter for screening for malignant neoplasm of colon: Secondary | ICD-10-CM | POA: Diagnosis not present

## 2015-12-14 DIAGNOSIS — Z86010 Personal history of colonic polyps: Secondary | ICD-10-CM | POA: Diagnosis not present

## 2015-12-14 MED ORDER — POLYETHYLENE GLYCOL 3350 17 GM/SCOOP PO POWD: ORAL | Status: DC

## 2015-12-14 NOTE — Progress Notes (Signed)
Patient ID: Heather Cobb, female   DOB: 12-28-1940, 75 y.o.   MRN: [id number]  Chief Complaint  Patient presents with  . Follow-up    recatal bleeding    HPI Heather Cobb is a 75 y.o. female. here today for rectal bleeding . Patient reported that she found the tablet fiber supplements tolerable and has been using this with either V8 or prune juice daily. She reports spending less time on the commode and less need to strain. Only one small bleeding episodes since her last visit.   The patient's daughter, [name], was present for the interview and exam.  I personally reviewed the patient's history.   HPI  Past Medical History  Diagnosis Date  . Hypothyroid   . Hypertension   . Anemia   . Vitamin B 12 deficiency   . Sleep apnea     Stop Bang score of 4  . Vertigo   . Pyelonephritis     Past Surgical History  Procedure Laterality Date  . Abdominal hysterectomy    . Vesicovaginal fistula closure w/ tah    . Cholecystectomy N/A 04/15/2015    Procedure: LAPAROSCOPIC CHOLECYSTECTOMY;  Surgeon: Aviva Signs, MD;  Location: [facility];  Service: General;  Laterality: N/A;  . Colonoscopy  2012    [place]  . Upper gastrointestinal endoscopy  2014    Family History  Problem Relation Age of Onset  . Asthma Mother   . Hypertension Daughter   . Cancer Neg Hx     Social History Social History  Substance Use Topics  . Smoking status: Never Smoker   . Smokeless tobacco: Never Used  . Alcohol Use: No    Allergies  Allergen Reactions  . Codeine Nausea Only and Other (See Comments)    fever    Current Outpatient Prescriptions  Medication Sig Dispense Refill  . aspirin EC 81 MG tablet Take 81 mg by mouth daily.    . cholecalciferol (VITAMIN D) 1000 UNITS tablet Take 1,000 Units by mouth daily.    Marland Kitchen docusate sodium (COLACE) 100 MG capsule Take 100 mg by mouth 2 (two) times daily.    Marland Kitchen estradiol (CLIMARA - DOSED IN MG/24 HR) 0.0375 mg/24hr patch Place 0.0375 mg onto the  skin once a week.    . ferrous fumarate (HEMOCYTE - 106 MG FE) 325 (106 FE) MG TABS Take 1 tablet (106 mg of iron total) by mouth daily. 30 each 0  . hydrocortisone (ANUSOL-HC) 25 MG suppository Place 1 suppository (25 mg total) rectally 2 (two) times daily. 12 suppository 0  . levothyroxine (SYNTHROID, LEVOTHROID) 88 MCG tablet Take 88 mcg by mouth daily before breakfast.    . olmesartan-hydrochlorothiazide (BENICAR HCT) 40-12.5 MG per tablet Take 1 tablet by mouth daily.    . vitamin B-12 (CYANOCOBALAMIN) 100 MCG tablet Take 100 mcg by mouth daily.    . polyethylene glycol powder (GLYCOLAX/MIRALAX) powder 255 grams one bottle for colonoscopy prep 255 g 0   No current facility-administered medications for this visit.    Review of Systems Review of Systems  Constitutional: Negative.   Eyes: Negative.     Blood pressure 130/76, resp. rate 16, height 5\' 5"  (1.651 m), weight 157 lb (71.215 kg).  Physical Exam Physical Exam  Constitutional: She is oriented to person, place, and time. She appears well-developed and well-nourished.  Cardiovascular: Normal rate, regular rhythm and normal heart sounds.   Pulmonary/Chest: Effort normal and breath sounds normal.  Abdominal: Soft. Bowel sounds are normal.  There is no tenderness.  Neurological: She is alert and oriented to person, place, and time.  Skin: Skin is warm and dry.    Data Reviewed Colonoscopy report from 07/26/2005 showed sessile polyps in the rectum and transverse colon. The transverse colon lesion was an adenomatous polyp without high-grade dysplasia. Rectal polyps were hyperplastic. Lymphoid aggregates noted.  Colonoscopy dated 12/01/2010 was notable for a few smallmouth diverticula in the sigmoid colon. No Lana Polyps were noted.  EGD dated 05/10/2013 for iron deficiency anemia showed local mucosal abnormality with congestion, erythema and erosion in the gastric antrum and prepyloric areas.  Pathology from the 05/10/2013 upper  endoscopy showed benign small bowel mucosa without evidence of inflammation or villous atrophy. There was no evidence of H. pylori infection.  CBC obtained at the time of the patient's cholecystectomy in July 2016 was normal.              Assessment    Rectal bleeding, likely anorectal source.   Recently developing anemia.    Plan          Colonoscopy with possible biopsy/polypectomy prn: Information regarding the procedure, including its potential risks and complications (including but not limited to perforation of the bowel, which may require emergency surgery to repair, and bleeding) was verbally given to the patient. Educational information regarding lower intestinal endoscopy was given to the patient. Written instructions for how to complete the bowel prep using Miralax were provided. The importance of drinking ample fluids to avoid dehydration as a result of the prep emphasized.  Patient has been scheduled for a colonoscopy on 02-03-16 at [facility]. It is okay for patient to continue 81 mg aspirin once daily.   PCP:  Sinda Du .   Robert Bellow 12/14/2015, 9:29 PM

## 2015-12-14 NOTE — Patient Instructions (Addendum)
Colonoscopy A colonoscopy is an exam to look at the entire large intestine (colon). This exam can help find problems such as tumors, polyps, inflammation, and areas of bleeding. The exam takes about 1 hour.  LET [facility] CARE PROVIDER KNOW ABOUT:   Any allergies you have.  All medicines you are taking, including vitamins, herbs, eye drops, creams, and over-the-counter medicines.  Previous problems you or members of your family have had with the use of anesthetics.  Any blood disorders you have.  Previous surgeries you have had.  Medical conditions you have. RISKS AND COMPLICATIONS  Generally, this is a safe procedure. However, as with any procedure, complications can occur. Possible complications include:  Bleeding.  Tearing or rupture of the colon wall.  Reaction to medicines given during the exam.  Infection (rare). BEFORE THE PROCEDURE   Ask your health care provider about changing or stopping your regular medicines.  You may be prescribed an oral bowel prep. This involves drinking a large amount of medicated liquid, starting the day before your procedure. The liquid will cause you to have multiple loose stools until your stool is almost clear or light green. This cleans out your colon in preparation for the procedure.  Do not eat or drink anything else once you have started the bowel prep, unless your health care provider tells you it is safe to do so.  Arrange for someone to drive you home after the procedure. PROCEDURE   You will be given medicine to help you relax (sedative).  You will lie on your side with your knees bent.  A long, flexible tube with a light and camera on the end (colonoscope) will be inserted through the rectum and into the colon. The camera sends video back to a computer screen as it moves through the colon. The colonoscope also releases carbon dioxide gas to inflate the colon. This helps your health care provider see the area better.  During  the exam, your health care provider may take a small tissue sample (biopsy) to be examined under a microscope if any abnormalities are found.  The exam is finished when the entire colon has been viewed. AFTER THE PROCEDURE   Do not drive for 24 hours after the exam.  You may have a small amount of blood in your stool.  You may pass moderate amounts of gas and have mild abdominal cramping or bloating. This is caused by the gas used to inflate your colon during the exam.  Ask when your test results will be ready and how you will get your results. Make sure you get your test results.   This information is not intended to replace advice given to you by your health care provider. Make sure you discuss any questions you have with your health care provider.   Document Released: 09/02/2000 Document Revised: 06/26/2013 Document Reviewed: 05/13/2013 Elsevier Interactive Patient Education [employer].  Patient has been scheduled for a colonoscopy on 02-03-16 at [facility]. It is okay for patient to continue 81 mg aspirin once daily.

## 2015-12-15 DIAGNOSIS — K5900 Constipation, unspecified: Secondary | ICD-10-CM | POA: Diagnosis not present

## 2015-12-15 DIAGNOSIS — E039 Hypothyroidism, unspecified: Secondary | ICD-10-CM | POA: Diagnosis not present

## 2015-12-15 DIAGNOSIS — K625 Hemorrhage of anus and rectum: Secondary | ICD-10-CM | POA: Diagnosis not present

## 2015-12-15 DIAGNOSIS — I10 Essential (primary) hypertension: Secondary | ICD-10-CM | POA: Diagnosis not present

## 2016-01-28 ENCOUNTER — Other Ambulatory Visit: Payer: Self-pay | Admitting: *Deleted

## 2016-01-28 MED ORDER — POLYETHYLENE GLYCOL 3350 17 GM/SCOOP PO POWD: ORAL | Status: DC

## 2016-02-01 ENCOUNTER — Telehealth: Payer: Self-pay | Admitting: *Deleted

## 2016-02-01 NOTE — Telephone Encounter (Signed)
Patient confirms no medication changes since last office visit. Also, states she has Miralax prescription.   We will proceed with colonoscopy as scheduled for 02-03-16 at [facility].   This patient was instructed to call the office should she have further questions.

## 2016-02-03 ENCOUNTER — Ambulatory Visit
Admission: RE | Admit: 2016-02-03 | Discharge: 2016-02-03 | Disposition: A | Discharge status: Home / Self Care | Payer: Medicare Other | Source: Ambulatory Visit | Attending: General Surgery | Admitting: General Surgery

## 2016-02-03 ENCOUNTER — Ambulatory Visit: Payer: Medicare Other | Admitting: Anesthesiology

## 2016-02-03 ENCOUNTER — Encounter: Payer: Self-pay | Admitting: *Deleted

## 2016-02-03 ENCOUNTER — Encounter
Admission: RE | Disposition: A | Discharge status: Home / Self Care | Payer: Self-pay | Source: Ambulatory Visit | Attending: General Surgery

## 2016-02-03 DIAGNOSIS — K640 First degree hemorrhoids: Secondary | ICD-10-CM | POA: Diagnosis not present

## 2016-02-03 DIAGNOSIS — E039 Hypothyroidism, unspecified: Secondary | ICD-10-CM | POA: Insufficient documentation

## 2016-02-03 DIAGNOSIS — Z79899 Other long term (current) drug therapy: Secondary | ICD-10-CM | POA: Diagnosis not present

## 2016-02-03 DIAGNOSIS — K648 Other hemorrhoids: Secondary | ICD-10-CM | POA: Diagnosis not present

## 2016-02-03 DIAGNOSIS — Z7982 Long term (current) use of aspirin: Secondary | ICD-10-CM | POA: Insufficient documentation

## 2016-02-03 DIAGNOSIS — K5790 Diverticulosis of intestine, part unspecified, without perforation or abscess without bleeding: Secondary | ICD-10-CM | POA: Diagnosis not present

## 2016-02-03 DIAGNOSIS — G4730 Sleep apnea, unspecified: Secondary | ICD-10-CM | POA: Insufficient documentation

## 2016-02-03 DIAGNOSIS — Z86010 Personal history of colonic polyps: Secondary | ICD-10-CM | POA: Diagnosis not present

## 2016-02-03 DIAGNOSIS — K625 Hemorrhage of anus and rectum: Secondary | ICD-10-CM | POA: Insufficient documentation

## 2016-02-03 DIAGNOSIS — I10 Essential (primary) hypertension: Secondary | ICD-10-CM | POA: Diagnosis not present

## 2016-02-03 DIAGNOSIS — Z1211 Encounter for screening for malignant neoplasm of colon: Secondary | ICD-10-CM

## 2016-02-03 DIAGNOSIS — D649 Anemia, unspecified: Secondary | ICD-10-CM | POA: Diagnosis not present

## 2016-02-03 DIAGNOSIS — K5730 Diverticulosis of large intestine without perforation or abscess without bleeding: Secondary | ICD-10-CM | POA: Insufficient documentation

## 2016-02-03 HISTORY — PX: COLONOSCOPY WITH PROPOFOL: SHX5780

## 2016-02-03 SURGERY — COLONOSCOPY WITH PROPOFOL
Anesthesia: General

## 2016-02-03 MED ORDER — LIDOCAINE 2% (20 MG/ML) 5 ML SYRINGE
INTRAMUSCULAR | Status: DC | PRN
  Administered 2016-02-03: 40 mg via INTRAVENOUS

## 2016-02-03 MED ORDER — MIDAZOLAM HCL 5 MG/5ML IJ SOLN
INTRAMUSCULAR | Status: DC | PRN
  Administered 2016-02-03: 1 mg via INTRAVENOUS

## 2016-02-03 MED ORDER — PHENYLEPHRINE 40 MCG/ML (10ML) SYRINGE FOR IV PUSH (FOR BLOOD PRESSURE SUPPORT)
PREFILLED_SYRINGE | INTRAVENOUS | Status: DC | PRN
  Administered 2016-02-03: 50 ug via INTRAVENOUS

## 2016-02-03 MED ORDER — SODIUM CHLORIDE 0.9 % IV SOLN
INTRAVENOUS | Status: DC
  Administered 2016-02-03 (×2): via INTRAVENOUS

## 2016-02-03 MED ORDER — PROPOFOL 500 MG/50ML IV EMUL
INTRAVENOUS | Status: DC | PRN
  Administered 2016-02-03: 160 ug/kg/min via INTRAVENOUS

## 2016-02-03 MED ORDER — FENTANYL CITRATE (PF) 100 MCG/2ML IJ SOLN
INTRAMUSCULAR | Status: DC | PRN
  Administered 2016-02-03: 50 ug via INTRAVENOUS

## 2016-02-03 MED ORDER — PROPOFOL 10 MG/ML IV BOLUS
INTRAVENOUS | Status: DC | PRN
  Administered 2016-02-03: 100 mg via INTRAVENOUS

## 2016-02-03 NOTE — Transfer of Care (Signed)
Immediate Anesthesia Transfer of Care Note  Patient: Heather Cobb  Procedure(s) Performed: Procedure(s): COLONOSCOPY WITH PROPOFOL (N/A)  Patient Location: PACU and Endoscopy Unit  Anesthesia Type:General  Level of Consciousness: sedated  Airway & Oxygen Therapy: Patient Spontanous Breathing and Patient connected to nasal cannula oxygen  Post-op Assessment: Report given to RN and Post -op Vital signs reviewed and stable  Post vital signs: Reviewed and stable  Last Vitals:  Filed Vitals:   02/03/16 1039  BP: 145/50  Pulse: 49  Temp: 36.1 C  Resp: 18    Last Pain: There were no vitals filed for this visit.       Complications: No apparent anesthesia complications

## 2016-02-03 NOTE — Op Note (Signed)
[facility] Patient Name: Heather Cobb Procedure Date: 02/03/2016 12:22 PM MRN: [id number] Account #: [id number] Date of Birth: 29-Jan-1941 Admit Type: Outpatient Age: 75 Room: [facility] ROOM 1 Gender: Female Note Status: Finalized Procedure:            Colonoscopy Indications:          Rectal bleeding Providers:            Robert Bellow, MD Referring MD:         Sinda Du (Referring MD) Medicines:            Monitored Anesthesia Care Complications:        No immediate complications. Procedure:            Pre-Anesthesia Assessment:                       - Prior to the procedure, a History and Physical was                        performed, and patient medications, allergies and                        sensitivities were reviewed. The patient's tolerance of                        previous anesthesia was reviewed.                       - The risks and benefits of the procedure and the                        sedation options and risks were discussed with the                        patient. All questions were answered and informed                        consent was obtained.                       After obtaining informed consent, the colonoscope was                        passed under direct vision. Throughout the procedure,                        the patient's blood pressure, pulse, and oxygen                        saturations were monitored continuously. The                        Colonoscope was introduced through the anus and                        advanced to the the cecum, identified by appendiceal                        orifice and ileocecal valve. The colonoscopy was  somewhat difficult due to significant looping.                        Successful completion of the procedure was aided by                        applying abdominal pressure. The patient tolerated the                        procedure well. The quality  of the bowel preparation                        was good. Findings:      A few medium-mouthed diverticula were found in the sigmoid colon.      Internal hemorrhoids were found during retroflexion. The hemorrhoids       were Grade I (internal hemorrhoids that do not prolapse). Impression:           - Diverticulosis in the sigmoid colon.                       - Internal hemorrhoids.                       - No specimens collected. Recommendation:       - Discharge patient to home (via wheelchair). Procedure Code(s):    --- Professional ---                       [phone], Colonoscopy, flexible; diagnostic, including                        collection of specimen(s) by brushing or washing, when                        performed (separate procedure) Diagnosis Code(s):    --- Professional ---                       K64.0, First degree hemorrhoids                       K62.5, Hemorrhage of anus and rectum                       K57.30, Diverticulosis of large intestine without                        perforation or abscess without bleeding CPT copyright 2016 American Medical Association. All rights reserved. The codes documented in this report are preliminary and upon coder review may  be revised to meet current compliance requirements. Robert Bellow, MD 02/03/2016 12:57:45 PM This report has been signed electronically. Number of Addenda: 0 Note Initiated On: 02/03/2016 12:22 PM Scope Withdrawal Time: 0 hours 13 minutes 49 seconds  Total Procedure Duration: 0 hours 28 minutes 28 seconds       [facility]

## 2016-02-03 NOTE — Anesthesia Preprocedure Evaluation (Addendum)
Anesthesia Evaluation  Patient identified by MRN, date of birth, ID band Patient awake    Reviewed: Allergy & Precautions, NPO status , Patient's Chart, lab work & pertinent test results, reviewed documented beta blocker date and time   Airway Mallampati: II  TM Distance: >3 FB     Dental  (+) Chipped, Partial Lower, Partial Upper   Pulmonary sleep apnea ,           Cardiovascular hypertension, Pt. on medications      Neuro/Psych    GI/Hepatic   Endo/Other  Hypothyroidism   Renal/GU Renal InsufficiencyRenal disease     Musculoskeletal   Abdominal   Peds  Hematology  (+) anemia ,   Anesthesia Other Findings Will use CPAP tonite.  Reproductive/Obstetrics                            Anesthesia Physical Anesthesia Plan  ASA: III  Anesthesia Plan: General   Post-op Pain Management:    Induction: Intravenous  Airway Management Planned: Nasal Cannula  Additional Equipment:   Intra-op Plan:   Post-operative Plan:   Informed Consent: I have reviewed the patients History and Physical, chart, labs and discussed the procedure including the risks, benefits and alternatives for the proposed anesthesia with the patient or authorized representative who has indicated his/her understanding and acceptance.     Plan Discussed with: CRNA  Anesthesia Plan Comments:         Anesthesia Quick Evaluation

## 2016-02-03 NOTE — H&P (Signed)
Heather Cobb [id number] 03-05-1941     HPI: History of rectal bleeding in March. Only one episode since. Tolerated prep well.  For colonoscopy.   Prescriptions prior to admission  Medication Sig Dispense Refill Last Dose  . aspirin EC 81 MG tablet Take 81 mg by mouth daily.   02/02/2016 at Unknown time  . cholecalciferol (VITAMIN D) 1000 UNITS tablet Take 1,000 Units by mouth daily.   Past Week at Unknown time  . ferrous fumarate (HEMOCYTE - 106 MG FE) 325 (106 FE) MG TABS Take 1 tablet (106 mg of iron total) by mouth daily. 30 each 0 Past Week at Unknown time  . levothyroxine (SYNTHROID, LEVOTHROID) 88 MCG tablet Take 88 mcg by mouth daily before breakfast.   02/02/2016 at Unknown time  . olmesartan-hydrochlorothiazide (BENICAR HCT) 40-12.5 MG per tablet Take 1 tablet by mouth daily.   02/02/2016 at Unknown time  . polyethylene glycol powder (GLYCOLAX/MIRALAX) powder 255 grams one bottle for colonoscopy prep 255 g 0 02/02/2016 at Unknown time  . vitamin B-12 (CYANOCOBALAMIN) 100 MCG tablet Take 100 mcg by mouth daily.   Past Week at Unknown time  . docusate sodium (COLACE) 100 MG capsule Take 100 mg by mouth 2 (two) times daily.   Taking  . estradiol (CLIMARA - DOSED IN MG/24 HR) 0.0375 mg/24hr patch Place 0.0375 mg onto the skin once a week.   Taking  . hydrocortisone (ANUSOL-HC) 25 MG suppository Place 1 suppository (25 mg total) rectally 2 (two) times daily. 12 suppository 0 Taking   Allergies  Allergen Reactions  . Codeine Nausea Only and Other (See Comments)    fever   Past Medical History  Diagnosis Date  . Hypothyroid   . Hypertension   . Anemia   . Vitamin B 12 deficiency   . Sleep apnea     Stop Bang score of 4  . Vertigo   . Pyelonephritis    Past Surgical History  Procedure Laterality Date  . Abdominal hysterectomy    . Vesicovaginal fistula closure w/ tah    . Cholecystectomy N/A 04/15/2015    Procedure: LAPAROSCOPIC CHOLECYSTECTOMY;  Surgeon: Aviva Signs, MD;   Location: [facility];  Service: General;  Laterality: N/A;  . Colonoscopy  2012    [place]  . Upper gastrointestinal endoscopy  2014   Social History   Social History  . Marital Status: Widowed    Spouse Name: N/A  . Number of Children: N/A  . Years of Education: N/A   Occupational History  . Not on file.   Social History Main Topics  . Smoking status: Never Smoker   . Smokeless tobacco: Never Used  . Alcohol Use: No  . Drug Use: No  . Sexual Activity: Yes    Birth Control/ Protection: Surgical   Other Topics Concern  . Not on file   Social History Narrative   ** Merged History Encounter **       Social History   Social History Narrative   ** Merged History Encounter **         ROS: Negative.     PE: HEENT: Negative. Lungs: Clear. Cardio: RR. Assessment/Plan:  Proceed with planned endoscopy.  Robert Bellow 02/03/2016

## 2016-02-03 NOTE — Anesthesia Postprocedure Evaluation (Signed)
Anesthesia Post Note  Patient: Heather Cobb  Procedure(s) Performed: Procedure(s) (LRB): COLONOSCOPY WITH PROPOFOL (N/A)  Patient location during evaluation: Endoscopy Anesthesia Type: General Level of consciousness: awake and alert Pain management: pain level controlled Vital Signs Assessment: post-procedure vital signs reviewed and stable Respiratory status: spontaneous breathing, nonlabored ventilation, respiratory function stable and patient connected to nasal cannula oxygen Cardiovascular status: blood pressure returned to baseline and stable Postop Assessment: no signs of nausea or vomiting Anesthetic complications: no    Last Vitals:  Filed Vitals:   02/03/16 1334 02/03/16 1344  BP: 117/43 124/54  Pulse: 64 56  Temp:    Resp: 14 16    Last Pain: There were no vitals filed for this visit.               [name] S

## 2016-02-04 ENCOUNTER — Encounter: Payer: Self-pay | Admitting: General Surgery

## 2016-03-29 DIAGNOSIS — R300 Dysuria: Secondary | ICD-10-CM | POA: Diagnosis not present

## 2016-03-29 DIAGNOSIS — M545 Low back pain: Secondary | ICD-10-CM | POA: Diagnosis not present

## 2016-05-26 ENCOUNTER — Encounter: Payer: Self-pay | Admitting: *Deleted

## 2016-06-01 ENCOUNTER — Encounter: Payer: Self-pay | Admitting: General Surgery

## 2016-06-01 ENCOUNTER — Ambulatory Visit (INDEPENDENT_AMBULATORY_CARE_PROVIDER_SITE_OTHER): Payer: Medicare Other | Admitting: General Surgery

## 2016-06-01 VITALS — BP 122/68 | HR 76 | Resp 12 | Ht 65.0 in | Wt 156.0 lb

## 2016-06-01 DIAGNOSIS — K625 Hemorrhage of anus and rectum: Secondary | ICD-10-CM

## 2016-06-01 MED ORDER — HYDROCORTISONE ACETATE 25 MG RE SUPP: 25.0000 mg | Freq: Two times a day (BID) | RECTAL | 1 refills | Status: DC

## 2016-06-01 NOTE — Progress Notes (Signed)
Patient ID: Heather Cobb, female   DOB: 06-03-1941, 75 y.o.   MRN: [id number]  Chief Complaint  Patient presents with  . Follow-up  . Rectal Bleeding    HPI Heather Cobb is a 75 y.o. female.here today for re evaluation of rectal bleeding. Patient states she noticed some bright red blood on the paper and in the bowl. No pain with moving her bowels. She noticed the bleeding over a week now.  Last colonoscopy was Feb 03 2016.  She had brought photographs of the bright red blood in the toilet bowl.   HPI  Past Medical History:  Diagnosis Date  . Anemia   . Hypertension   . Hypothyroid   . Pyelonephritis   . Sleep apnea    Stop Bang score of 4  . Vertigo   . Vitamin B 12 deficiency     Past Surgical History:  Procedure Laterality Date  . ABDOMINAL HYSTERECTOMY    . CHOLECYSTECTOMY N/A 04/15/2015   Procedure: LAPAROSCOPIC CHOLECYSTECTOMY;  Surgeon: Aviva Signs, MD;  Location: [facility];  Service: General;  Laterality: N/A;  . COLONOSCOPY  2012   [place]  . COLONOSCOPY WITH PROPOFOL N/A 02/03/2016   Procedure: COLONOSCOPY WITH PROPOFOL;  Surgeon: Robert Bellow, MD;  Location: [facility] END[facility]COPY;  Service: Endoscopy;  Laterality: N/A;  . UPPER GASTROINTESTINAL ENDOSCOPY  2014  . VESICOVAGINAL FISTULA CLOSURE W/ TAH      Family History  Problem Relation Age of Onset  . Asthma Mother   . Hypertension Daughter   . Cancer Neg Hx     Social History Social History  Substance Use Topics  . Smoking status: Never Smoker  . Smokeless tobacco: Never Used  . Alcohol use No    Allergies  Allergen Reactions  . Codeine Nausea Only and Other (See Comments)    fever    Current Outpatient Prescriptions  Medication Sig Dispense Refill  . aspirin EC 81 MG tablet Take 81 mg by mouth daily.    . cholecalciferol (VITAMIN D) 1000 UNITS tablet Take 1,000 Units by mouth daily.    Marland Kitchen docusate sodium (COLACE) 100 MG capsule Take 100 mg by mouth 2 (two) times daily.    Marland Kitchen estradiol  (CLIMARA - DOSED IN MG/24 HR) 0.0375 mg/24hr patch Place 0.0375 mg onto the skin once a week.    . ferrous fumarate (HEMOCYTE - 106 MG FE) 325 (106 FE) MG TABS Take 1 tablet (106 mg of iron total) by mouth daily. 30 each 0  . levothyroxine (SYNTHROID, LEVOTHROID) 88 MCG tablet Take 88 mcg by mouth daily before breakfast.    . olmesartan-hydrochlorothiazide (BENICAR HCT) 40-12.5 MG per tablet Take 1 tablet by mouth daily.    . vitamin B-12 (CYANOCOBALAMIN) 100 MCG tablet Take 100 mcg by mouth daily.    . hydrocortisone (ANUSOL-HC) 25 MG suppository Place 1 suppository (25 mg total) rectally 2 (two) times daily. 12 suppository 1   No current facility-administered medications for this visit.     Review of Systems Review of Systems  Constitutional: Negative.   Respiratory: Negative.   Cardiovascular: Negative.   Gastrointestinal: Positive for anal bleeding.    Blood pressure 122/68, pulse 76, resp. rate 12, height 5\' 5"  (1.651 m), weight 156 lb (70.8 kg).  Physical Exam Physical Exam  Constitutional: She is oriented to person, place, and time. She appears well-developed and well-nourished.  Genitourinary: Rectal exam shows internal hemorrhoid.  Neurological: She is alert and oriented to person, place, and time.  Skin: Skin is warm and dry.    Data Reviewed Anoscopy: Mildly prominent internal hemorrhoids on the right side, anterior and posterior. No active bleeding. Otherwise benign exam.  Assessment    Rectal bleeding from anorectal source.    Plan    Prior to making use of hemorrhoid banding, the patient has been asked to make use of a trial of Anusol HC suppositories twice a day 6 days.    Rx sent to [facility] . Return as needed.Call office in 10 days and report. This information has been scribed by Gaspar Cola CMA.   Robert Bellow 06/03/2016, 2:11 PM

## 2016-06-01 NOTE — Patient Instructions (Addendum)
The patient is aware to call back for any questions or concerns.Rx sent to [facility] . Return as needed.Call office in 10 days and report.

## 2016-06-16 DIAGNOSIS — Z13820 Encounter for screening for osteoporosis: Secondary | ICD-10-CM | POA: Diagnosis not present

## 2016-06-16 DIAGNOSIS — Z79890 Hormone replacement therapy (postmenopausal): Secondary | ICD-10-CM | POA: Diagnosis not present

## 2016-06-16 DIAGNOSIS — N951 Menopausal and female climacteric states: Secondary | ICD-10-CM | POA: Diagnosis not present

## 2016-06-16 DIAGNOSIS — Z1239 Encounter for other screening for malignant neoplasm of breast: Secondary | ICD-10-CM | POA: Diagnosis not present

## 2016-06-29 DIAGNOSIS — I10 Essential (primary) hypertension: Secondary | ICD-10-CM | POA: Diagnosis not present

## 2016-06-29 DIAGNOSIS — Z23 Encounter for immunization: Secondary | ICD-10-CM | POA: Diagnosis not present

## 2016-06-29 DIAGNOSIS — J209 Acute bronchitis, unspecified: Secondary | ICD-10-CM | POA: Diagnosis not present

## 2016-06-29 DIAGNOSIS — E039 Hypothyroidism, unspecified: Secondary | ICD-10-CM | POA: Diagnosis not present

## 2016-10-25 ENCOUNTER — Ambulatory Visit (INDEPENDENT_AMBULATORY_CARE_PROVIDER_SITE_OTHER): Payer: PPO | Admitting: General Surgery

## 2016-10-25 ENCOUNTER — Encounter: Payer: Self-pay | Admitting: General Surgery

## 2016-10-25 VITALS — BP 126/74 | HR 66 | Resp 12 | Ht 65.0 in | Wt 155.0 lb

## 2016-10-25 DIAGNOSIS — K625 Hemorrhage of anus and rectum: Secondary | ICD-10-CM

## 2016-10-25 NOTE — Progress Notes (Signed)
Patient ID: Heather Cobb, female   DOB: [birth year], 76 y.o.   MRN: [id number]  Chief Complaint  Patient presents with  . Follow-up    HPI Heather Cobb is a 76 y.o. female here today for rectal bleeding. Patient states she noticed the blood on after her colonoscopy done on 02/03/2016. She states she see bright red blood on the paper and in the bowl. No pain. Daughter , Diresa present at visit.  HPI  Past Medical History:  Diagnosis Date  . Anemia   . Hypertension   . Hypothyroid   . Pyelonephritis   . Sleep apnea    Stop Bang score of 4  . Vertigo   . Vitamin B 12 deficiency     Past Surgical History:  Procedure Laterality Date  . ABDOMINAL HYSTERECTOMY    . CHOLECYSTECTOMY N/A 04/15/2015   Procedure: LAPAROSCOPIC CHOLECYSTECTOMY;  Surgeon: Aviva Signs, MD;  Location: [facility];  Service: General;  Laterality: N/A;  . COLONOSCOPY  2012   [place]  . COLONOSCOPY WITH PROPOFOL N/A 02/03/2016   Procedure: COLONOSCOPY WITH PROPOFOL;  Surgeon: Robert Bellow, MD;  Location: [facility] ENDOSCOPY;  Service: Endoscopy;  Laterality: N/A;  . UPPER GASTROINTESTINAL ENDOSCOPY  2014  . VESICOVAGINAL FISTULA CLOSURE W/ TAH      Family History  Problem Relation Age of Onset  . Asthma Mother   . Hypertension Daughter   . Cancer Neg Hx     Social History Social History  Substance Use Topics  . Smoking status: Never Smoker  . Smokeless tobacco: Never Used  . Alcohol use No    Allergies  Allergen Reactions  . Codeine Nausea Only and Other (See Comments)    fever    Current Outpatient Prescriptions  Medication Sig Dispense Refill  . aspirin EC 81 MG tablet Take 81 mg by mouth daily.    . cholecalciferol (VITAMIN D) 1000 UNITS tablet Take 1,000 Units by mouth daily.    Marland Kitchen docusate sodium (COLACE) 100 MG capsule Take 100 mg by mouth 2 (two) times daily.    . ferrous fumarate (HEMOCYTE - 106 MG FE) 325 (106 FE) MG TABS Take 1 tablet (106 mg of iron total) by mouth daily. 30 each 0  .  levothyroxine (SYNTHROID, LEVOTHROID) 88 MCG tablet Take 88 mcg by mouth daily before breakfast.    . olmesartan-hydrochlorothiazide (BENICAR HCT) 40-12.5 MG per tablet Take 1 tablet by mouth daily.    . vitamin B-12 (CYANOCOBALAMIN) 100 MCG tablet Take 100 mcg by mouth daily.     No current facility-administered medications for this visit.     Review of Systems Review of Systems  Constitutional: Negative.   Respiratory: Negative.   Cardiovascular: Negative.   Gastrointestinal: Positive for anal bleeding and constipation.    Blood pressure 126/74, pulse 66, resp. rate 12, height 5\' 5"  (1.651 m), weight 155 lb (70.3 kg).  Physical Exam Physical Exam  Constitutional: She is oriented to person, place, and time. She appears well-developed and well-nourished.  Eyes: Conjunctivae are normal. No scleral icterus.  Neck: Neck supple.  Cardiovascular: Normal rate, regular rhythm and normal heart sounds.   Pulmonary/Chest: Effort normal and breath sounds normal.  Abdominal: Soft. Bowel sounds are normal. There is no tenderness.  Lymphadenopathy:    She has no cervical adenopathy.  Neurological: She is alert and oriented to person, place, and time.  Skin: Skin is warm and dry.    Data Reviewed 02/03/2016 colonoscopy completed for rectal bleeding was notable  for sigmoid colon diverticulosis as well as internal hemorrhoids.  Anoscopy showed moderately prominent internal hemorrhoids anteriorly and posteriorly. Bands applied to the anterior lesion popped off directly without significant bleeding. Bands were applied to the posterior group without difficulty.No pain experienced before after the procedure.  Assessment    Intermittent bleeding likely secondary to anorectal source, internal hemorrhoids.    Plan    The patient was instructed to call promptly should she develop perianal pain, spasm, fever or chills. The patient was asked to give a phone follow-up in 2-3 weeks to report if she  has had resolution of her symptoms.      This information has been scribed by Gaspar Cola CMA.  Robert Bellow 10/26/2016, 8:03 PM

## 2016-10-25 NOTE — Patient Instructions (Addendum)
The patient is aware to call back for any questions or concerns.  Patient to call us in a couple of weeks.

## 2016-11-09 ENCOUNTER — Telehealth: Payer: Self-pay | Admitting: General Surgery

## 2016-11-09 NOTE — Telephone Encounter (Signed)
Patient's daughter Oliver Hum CAME IN TO SAY HER MOTHER WAS DOING WELL AFTER THE HEMORRHOID BANDING.NO BLEEDING./MTH

## 2016-11-16 DIAGNOSIS — D519 Vitamin B12 deficiency anemia, unspecified: Secondary | ICD-10-CM | POA: Diagnosis not present

## 2016-11-16 DIAGNOSIS — R42 Dizziness and giddiness: Secondary | ICD-10-CM | POA: Diagnosis not present

## 2016-11-16 DIAGNOSIS — E039 Hypothyroidism, unspecified: Secondary | ICD-10-CM | POA: Diagnosis not present

## 2016-11-16 DIAGNOSIS — I10 Essential (primary) hypertension: Secondary | ICD-10-CM | POA: Diagnosis not present

## 2016-11-16 DIAGNOSIS — E559 Vitamin D deficiency, unspecified: Secondary | ICD-10-CM | POA: Diagnosis not present

## 2016-11-16 DIAGNOSIS — E034 Atrophy of thyroid (acquired): Secondary | ICD-10-CM | POA: Diagnosis not present

## 2016-11-16 DIAGNOSIS — K5901 Slow transit constipation: Secondary | ICD-10-CM | POA: Diagnosis not present

## 2016-11-22 ENCOUNTER — Telehealth: Payer: Self-pay | Admitting: Obstetrics and Gynecology

## 2016-11-22 NOTE — Telephone Encounter (Signed)
Pt needs f/u appt if sx are still that bad for better tx. RN to notify pt to sched.

## 2016-11-22 NOTE — Telephone Encounter (Signed)
Pt is using medication bid. She states sometimes she is using more medication than others due to severe symptoms.

## 2016-11-22 NOTE — Telephone Encounter (Signed)
Patient asking for 3 month refill on Nystatin sent to [facility].  May call daughter, Oliver Hum at home number [phone].

## 2016-11-22 NOTE — Telephone Encounter (Signed)
Rx nystatin sent in 1/18. How frequently is patient using it? RN to discuss with pt.

## 2016-11-23 NOTE — Telephone Encounter (Signed)
Left msg for pt that she needs appt.

## 2016-11-23 NOTE — Telephone Encounter (Signed)
Not sure if this went through yesterday.

## 2016-11-28 DIAGNOSIS — Z1231 Encounter for screening mammogram for malignant neoplasm of breast: Secondary | ICD-10-CM | POA: Diagnosis not present

## 2016-12-02 ENCOUNTER — Other Ambulatory Visit: Payer: Self-pay | Admitting: Adult Health

## 2016-12-02 DIAGNOSIS — R5381 Other malaise: Secondary | ICD-10-CM

## 2016-12-05 ENCOUNTER — Other Ambulatory Visit: Payer: Self-pay | Admitting: Adult Health

## 2016-12-05 DIAGNOSIS — E2839 Other primary ovarian failure: Secondary | ICD-10-CM

## 2016-12-15 ENCOUNTER — Ambulatory Visit
Admission: RE | Admit: 2016-12-15 | Discharge: 2016-12-15 | Disposition: A | Discharge status: Home / Self Care | Payer: PPO | Source: Ambulatory Visit | Attending: Adult Health | Admitting: Adult Health

## 2016-12-15 DIAGNOSIS — M85851 Other specified disorders of bone density and structure, right thigh: Secondary | ICD-10-CM | POA: Diagnosis not present

## 2016-12-15 DIAGNOSIS — Z780 Asymptomatic menopausal state: Secondary | ICD-10-CM | POA: Diagnosis not present

## 2016-12-15 DIAGNOSIS — E2839 Other primary ovarian failure: Secondary | ICD-10-CM

## 2016-12-28 DIAGNOSIS — E039 Hypothyroidism, unspecified: Secondary | ICD-10-CM | POA: Diagnosis not present

## 2016-12-28 DIAGNOSIS — I10 Essential (primary) hypertension: Secondary | ICD-10-CM | POA: Diagnosis not present

## 2016-12-28 DIAGNOSIS — K625 Hemorrhage of anus and rectum: Secondary | ICD-10-CM | POA: Diagnosis not present

## 2016-12-28 DIAGNOSIS — N182 Chronic kidney disease, stage 2 (mild): Secondary | ICD-10-CM | POA: Diagnosis not present

## 2017-01-27 DIAGNOSIS — H35362 Drusen (degenerative) of macula, left eye: Secondary | ICD-10-CM | POA: Diagnosis not present

## 2017-01-27 DIAGNOSIS — H26493 Other secondary cataract, bilateral: Secondary | ICD-10-CM | POA: Diagnosis not present

## 2017-02-03 DIAGNOSIS — E034 Atrophy of thyroid (acquired): Secondary | ICD-10-CM | POA: Diagnosis not present

## 2017-03-20 DIAGNOSIS — E034 Atrophy of thyroid (acquired): Secondary | ICD-10-CM | POA: Diagnosis not present

## 2017-03-29 ENCOUNTER — Telehealth: Payer: Self-pay | Admitting: *Deleted

## 2017-03-29 ENCOUNTER — Encounter: Payer: Self-pay | Admitting: General Surgery

## 2017-03-29 ENCOUNTER — Ambulatory Visit (INDEPENDENT_AMBULATORY_CARE_PROVIDER_SITE_OTHER): Payer: PPO | Admitting: General Surgery

## 2017-03-29 VITALS — BP 130/70 | HR 68 | Resp 13 | Ht 59.0 in | Wt 155.0 lb

## 2017-03-29 DIAGNOSIS — K625 Hemorrhage of anus and rectum: Secondary | ICD-10-CM | POA: Diagnosis not present

## 2017-03-29 NOTE — Patient Instructions (Signed)
Patient to return as needed. The patient is aware to call back for any questions or concerns. 

## 2017-03-29 NOTE — Telephone Encounter (Signed)
She called to let us know she started having rectal bleeding 4 days ago, same as before surgery/banding. She states that this morning she noticed some clots even without having a BM. Bowels are moving regular and denies constipation. Appointment made for today, pt agrees.

## 2017-03-29 NOTE — Progress Notes (Signed)
Patient ID: Heather Cobb, female   DOB: 14-Aug-1941, 76 y.o.   MRN: [id number]  Chief Complaint  Patient presents with  . Rectal Bleeding    HPI Heather Cobb is a 76 y.o. female here today for a evaluation of rectal bleeding. Patient states she has noticed some spotting on the paper when wrapping. This morning she noticed a large amount of blood on her pad.  HPI  Past Medical History:  Diagnosis Date  . Anemia   . Hypertension   . Hypothyroid   . Pyelonephritis   . Sleep apnea    Stop Bang score of 4  . Vertigo   . Vitamin B 12 deficiency     Past Surgical History:  Procedure Laterality Date  . ABDOMINAL HYSTERECTOMY    . CHOLECYSTECTOMY N/A 04/15/2015   Procedure: LAPAROSCOPIC CHOLECYSTECTOMY;  Surgeon: Aviva Signs, MD;  Location: [facility];  Service: General;  Laterality: N/A;  . COLONOSCOPY  2012   [place]  . COLONOSCOPY WITH PROPOFOL N/A 02/03/2016   Procedure: COLONOSCOPY WITH PROPOFOL;  Surgeon: Robert Bellow, MD;  Location: [facility] ENDOSCOPY;  Service: Endoscopy;  Laterality: N/A;  . UPPER GASTROINTESTINAL ENDOSCOPY  2014  . VESICOVAGINAL FISTULA CLOSURE W/ TAH      Family History  Problem Relation Age of Onset  . Asthma Mother   . Hypertension Daughter   . Cancer Neg Hx     Social History Social History  Substance Use Topics  . Smoking status: Never Smoker  . Smokeless tobacco: Never Used  . Alcohol use No    Allergies  Allergen Reactions  . Codeine Nausea Only and Other (See Comments)    fever    Current Outpatient Prescriptions  Medication Sig Dispense Refill  . aspirin EC 81 MG tablet Take 81 mg by mouth daily.    . cholecalciferol (VITAMIN D) 1000 UNITS tablet Take 1,000 Units by mouth daily.    Marland Kitchen docusate sodium (COLACE) 100 MG capsule Take 100 mg by mouth 2 (two) times daily.    . ferrous fumarate (HEMOCYTE - 106 MG FE) 325 (106 FE) MG TABS Take 1 tablet (106 mg of iron total) by mouth daily. 30 each 0  . levothyroxine (SYNTHROID,  LEVOTHROID) 88 MCG tablet Take 88 mcg by mouth daily before breakfast.    . olmesartan-hydrochlorothiazide (BENICAR HCT) 40-12.5 MG per tablet Take 1 tablet by mouth daily.    . vitamin B-12 (CYANOCOBALAMIN) 100 MCG tablet Take 100 mcg by mouth daily.     No current facility-administered medications for this visit.     Review of Systems Review of Systems  Constitutional: Negative.   Respiratory: Negative.   Cardiovascular: Negative.   Gastrointestinal: Positive for anal bleeding.    Blood pressure 130/70, pulse 68, resp. rate 13, height 4\' 11"  (1.499 m), weight 155 lb (70.3 kg).  Physical Exam Physical Exam  Constitutional: She is oriented to person, place, and time. She appears well-developed and well-nourished.  Genitourinary: Rectal exam shows internal hemorrhoid. Rectal exam shows no mass and anal tone normal.  Genitourinary Comments: =  Neurological: She is alert and oriented to person, place, and time.  Skin: Skin is warm and dry.    Data Reviewed Anoscopy: Grossly normal green stool. No blood evident. Small internal hemorrhoid with evidence of recent bleeding.   Assessment    Bleeding from ano-rectal source.     Plan         Based on today's exam, suspect stool trauma as source of  bleeding. To use stool softener or fiber supplement daily.   Patient to return as needed. The patient is aware to call back for any questions or concerns.  HPI, Physical Exam, Assessment and Plan have been scribed under the direction and in the presence of Hervey Ard, MD.  Gaspar Cola, CMA'   Gaspar Cola 03/29/2017, 3:39 PM

## 2017-04-17 ENCOUNTER — Ambulatory Visit: Payer: Self-pay | Admitting: General Surgery

## 2017-08-02 DIAGNOSIS — N182 Chronic kidney disease, stage 2 (mild): Secondary | ICD-10-CM | POA: Diagnosis not present

## 2017-08-02 DIAGNOSIS — E039 Hypothyroidism, unspecified: Secondary | ICD-10-CM | POA: Diagnosis not present

## 2017-08-02 DIAGNOSIS — R221 Localized swelling, mass and lump, neck: Secondary | ICD-10-CM | POA: Diagnosis not present

## 2017-08-02 DIAGNOSIS — I10 Essential (primary) hypertension: Secondary | ICD-10-CM | POA: Diagnosis not present

## 2017-08-07 ENCOUNTER — Other Ambulatory Visit (HOSPITAL_COMMUNITY): Payer: Self-pay | Admitting: Pulmonary Disease

## 2017-08-07 DIAGNOSIS — R221 Localized swelling, mass and lump, neck: Secondary | ICD-10-CM

## 2017-08-15 ENCOUNTER — Ambulatory Visit (HOSPITAL_COMMUNITY)
Admission: RE | Admit: 2017-08-15 | Discharge: 2017-08-15 | Disposition: A | Discharge status: Home / Self Care | Payer: PPO | Source: Ambulatory Visit | Attending: Pulmonary Disease | Admitting: Pulmonary Disease

## 2017-08-15 DIAGNOSIS — R221 Localized swelling, mass and lump, neck: Secondary | ICD-10-CM | POA: Diagnosis not present

## 2017-08-15 DIAGNOSIS — E049 Nontoxic goiter, unspecified: Secondary | ICD-10-CM | POA: Diagnosis not present

## 2017-08-15 DIAGNOSIS — M542 Cervicalgia: Secondary | ICD-10-CM | POA: Diagnosis not present

## 2017-08-15 LAB — POCT I-STAT CREATININE: Creatinine, Ser: 1.2 mg/dL — ABNORMAL HIGH (ref 0.44–1.00)

## 2017-08-15 MED ORDER — IOPAMIDOL (ISOVUE-300) INJECTION 61%
75.0000 mL | Freq: Once | INTRAVENOUS | Status: AC | PRN
Start: 2017-08-15 — End: 2017-08-15
  Administered 2017-08-15: 75 mL via INTRAVENOUS

## 2017-08-17 DIAGNOSIS — I10 Essential (primary) hypertension: Secondary | ICD-10-CM | POA: Diagnosis not present

## 2017-08-17 DIAGNOSIS — E039 Hypothyroidism, unspecified: Secondary | ICD-10-CM | POA: Diagnosis not present

## 2017-08-17 DIAGNOSIS — N182 Chronic kidney disease, stage 2 (mild): Secondary | ICD-10-CM | POA: Diagnosis not present

## 2017-08-17 DIAGNOSIS — K5900 Constipation, unspecified: Secondary | ICD-10-CM | POA: Diagnosis not present

## 2017-08-18 LAB — BASIC METABOLIC PANEL: Glucose: 92

## 2017-08-18 LAB — LIPID PANEL
Cholesterol: 177 (ref 0–200)
HDL: 61 (ref 35–70)
LDL Cholesterol: 92
LDl/HDL Ratio: 2.9
Triglycerides: 141 (ref 40–160)

## 2017-08-21 ENCOUNTER — Encounter (HOSPITAL_COMMUNITY): Payer: Self-pay | Admitting: Oncology

## 2017-08-21 ENCOUNTER — Other Ambulatory Visit (HOSPITAL_COMMUNITY): Payer: PPO

## 2017-08-21 ENCOUNTER — Encounter (HOSPITAL_COMMUNITY): Payer: PPO | Attending: Oncology | Admitting: Oncology

## 2017-08-21 ENCOUNTER — Other Ambulatory Visit: Payer: Self-pay

## 2017-08-21 DIAGNOSIS — D446 Neoplasm of uncertain behavior of carotid body: Secondary | ICD-10-CM

## 2017-08-21 DIAGNOSIS — Z79899 Other long term (current) drug therapy: Secondary | ICD-10-CM | POA: Insufficient documentation

## 2017-08-21 DIAGNOSIS — E039 Hypothyroidism, unspecified: Secondary | ICD-10-CM | POA: Insufficient documentation

## 2017-08-21 DIAGNOSIS — D649 Anemia, unspecified: Secondary | ICD-10-CM | POA: Insufficient documentation

## 2017-08-21 DIAGNOSIS — Z885 Allergy status to narcotic agent status: Secondary | ICD-10-CM | POA: Insufficient documentation

## 2017-08-21 DIAGNOSIS — G4730 Sleep apnea, unspecified: Secondary | ICD-10-CM | POA: Insufficient documentation

## 2017-08-21 DIAGNOSIS — Z7982 Long term (current) use of aspirin: Secondary | ICD-10-CM | POA: Insufficient documentation

## 2017-08-21 DIAGNOSIS — Z8249 Family history of ischemic heart disease and other diseases of the circulatory system: Secondary | ICD-10-CM | POA: Insufficient documentation

## 2017-08-21 DIAGNOSIS — D447 Neoplasm of uncertain behavior of aortic body and other paraganglia: Secondary | ICD-10-CM | POA: Insufficient documentation

## 2017-08-21 DIAGNOSIS — I10 Essential (primary) hypertension: Secondary | ICD-10-CM | POA: Insufficient documentation

## 2017-08-21 DIAGNOSIS — E538 Deficiency of other specified B group vitamins: Secondary | ICD-10-CM | POA: Insufficient documentation

## 2017-08-21 DIAGNOSIS — Z79890 Hormone replacement therapy (postmenopausal): Secondary | ICD-10-CM | POA: Insufficient documentation

## 2017-08-21 DIAGNOSIS — Z9049 Acquired absence of other specified parts of digestive tract: Secondary | ICD-10-CM | POA: Insufficient documentation

## 2017-08-21 DIAGNOSIS — Z90710 Acquired absence of both cervix and uterus: Secondary | ICD-10-CM | POA: Insufficient documentation

## 2017-08-21 DIAGNOSIS — Z825 Family history of asthma and other chronic lower respiratory diseases: Secondary | ICD-10-CM | POA: Insufficient documentation

## 2017-08-21 NOTE — Progress Notes (Signed)
[facility] Initial Visit:  Patient Care Team: Sinda Du, MD as PCP - General (Internal Medicine) Ammie Dalton, Okey Regal, MD (Unknown Physician Specialty) Bary Castilla Forest Gleason, MD (General Surgery)  CHIEF COMPLAINTS/PURPOSE OF CONSULTATION:   Left carotid body tumor  HISTORY OF PRESENTING ILLNESS: Heather Cobb 76 y.o. female presents today for evaluation of left carotid body tumor with her dauther.  Patient went to see her PCP Dr. Luan Pulling with complaint of a "knot" on the right side of her neck for 1 month.  She was sent for CT soft tissue neck with contrast on 08/15/2017 which demonstrated no mass identified within the right lateral neck at the marked site of interest, instantly she is found to have an 11 mm mass centered at the left carotid bifurcation, likely carotid body tumor. Patient is completely asymptomatic. She denies any headaches, chest pain, shortness of breath, abdominal pain, N/V/D, weight loss, dizziness.   Review of Systems - Oncology ROS as per HPI otherwise 12 point ROS is negative.  MEDICAL HISTORY: Past Medical History:  Diagnosis Date  . Anemia   . Hypertension   . Hypothyroid   . Pyelonephritis   . Sleep apnea    Stop Bang score of 4  . Vertigo   . Vitamin B 12 deficiency     SURGICAL HISTORY: Past Surgical History:  Procedure Laterality Date  . ABDOMINAL HYSTERECTOMY    . CHOLECYSTECTOMY N/A 04/15/2015   Procedure: LAPAROSCOPIC CHOLECYSTECTOMY;  Surgeon: Aviva Signs, MD;  Location: [facility];  Service: General;  Laterality: N/A;  . COLONOSCOPY  2012   [place]  . COLONOSCOPY WITH PROPOFOL N/A 02/03/2016   Procedure: COLONOSCOPY WITH PROPOFOL;  Surgeon: Robert Bellow, MD;  Location: [facility] ENDOSCOPY;  Service: Endoscopy;  Laterality: N/A;  . UPPER GASTROINTESTINAL ENDOSCOPY  2014  . VESICOVAGINAL FISTULA CLOSURE W/ TAH      SOCIAL HISTORY: Social History   Socioeconomic History  . Marital status: Widowed    Spouse name:  Not on file  . Number of children: Not on file  . Years of education: Not on file  . Highest education level: Not on file  Social Needs  . Financial resource strain: Not on file  . Food insecurity - worry: Not on file  . Food insecurity - inability: Not on file  . Transportation needs - medical: Not on file  . Transportation needs - non-medical: Not on file  Occupational History  . Not on file  Tobacco Use  . Smoking status: Never Smoker  . Smokeless tobacco: Never Used  Substance and Sexual Activity  . Alcohol use: No    Alcohol/week: 0.0 oz  . Drug use: No  . Sexual activity: Yes    Birth control/protection: Surgical  Other Topics Concern  . Not on file  Social History Narrative   ** Merged History Encounter **        FAMILY HISTORY Family History  Problem Relation Age of Onset  . Asthma Mother   . Hypertension Daughter   . Cancer Neg Hx     ALLERGIES:  is allergic to codeine.  MEDICATIONS:  Current Outpatient Medications  Medication Sig Dispense Refill  . aspirin EC 81 MG tablet Take 81 mg by mouth daily.    . cholecalciferol (VITAMIN D) 1000 UNITS tablet Take 1,000 Units by mouth daily.    Marland Kitchen docusate sodium (COLACE) 100 MG capsule Take 100 mg by mouth 2 (two) times daily.    . ferrous fumarate (HEMOCYTE -  106 MG FE) 325 (106 FE) MG TABS Take 1 tablet (106 mg of iron total) by mouth daily. 30 each 0  . levothyroxine (SYNTHROID, LEVOTHROID) 88 MCG tablet Take 88 mcg by mouth daily before breakfast.    . olmesartan-hydrochlorothiazide (BENICAR HCT) 40-12.5 MG per tablet Take 1 tablet by mouth daily.    . vitamin B-12 (CYANOCOBALAMIN) 100 MCG tablet Take 100 mcg by mouth daily.     No current facility-administered medications for this visit.     PHYSICAL EXAMINATION:  ECOG PERFORMANCE STATUS: 0 - Asymptomatic   Vitals:   08/21/17 1317  BP: 109/80  Pulse: (!) 58  Resp: 20  Temp: 97.6 F (36.4 C)  SpO2: 97%    Filed Weights   08/21/17 1317  Weight:  156 lb 4.8 oz (70.9 kg)     Physical Exam Constitutional: Well-developed, well-nourished, and in no distress.   HENT:  Head: Normocephalic and atraumatic.  Mouth/Throat: No oropharyngeal exudate. Mucosa moist. Eyes: Pupils are equal, round, and reactive to light. Conjunctivae are normal. No scleral icterus.  Neck: Normal range of motion. Neck supple. No JVD present.  No masses palpated. Cardiovascular: Normal rate, regular rhythm and normal heart sounds.  Exam reveals no gallop and no friction rub.   No murmur heard. Pulmonary/Chest: Effort normal and breath sounds normal. No respiratory distress. No wheezes.No rales.  Abdominal: Soft. Bowel sounds are normal. No distension. There is no tenderness. There is no guarding.  Musculoskeletal: No edema or tenderness.  Lymphadenopathy:    No cervical or supraclavicular adenopathy.  Neurological: Alert and oriented to person, place, and time. No cranial nerve deficit.  Skin: Skin is warm and dry. No rash noted. No erythema. No pallor.  Psychiatric: Affect and judgment normal.     LABORATORY DATA: I have personally reviewed the data as listed:  Hospital Outpatient Visit on 08/15/2017  Component Date Value Ref Range Status  . Creatinine, Ser 08/15/2017 1.20* 0.44 - 1.00 mg/dL Final    RADIOGRAPHIC STUDIES: I have personally reviewed the radiological images as listed and agree with the findings in the report  No results found.  ASSESSMENT/PLAN 1. 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  -Carotid body tumor, also known as paragangliomas, are rare neuro endocrine tumors.  Most paragangliomas appears to be benign but over time about 15-35% can display malignant behavior.  -Options for locoregional management can include surgical resection or radiation therapy (RT). Less invasive approaches, such as conventionally-fractionated external beam RT or stereotactic body RT (SBRT), may provide high rates of long-term disease  control for skull base and neck paragangliomas or for non-skull base and neck paragangliomas that are unresectable, but they do not offer the same degree of symptom relief that is accomplished with resection.   -I have made a stat referral to vascular surgery for evaluation and treatment.  -Prior to resection, catecholamine secretion should be assessed biochemically in all patients with suspected paraganglioma, even if they do not present with a clinical picture of catecholamine hypersecretion. Undiagnosed catecholamine hypersecretion in patients without symptoms of catecholamine excess is not uncommon and can cause major morbidity and mortality during resection.   -I have ordered for 24 hour urine catecholamines and metanephrines.  -RTC in 2 months for follow up.  All questions were answered. The patient knows to call the clinic with any problems, questions or concerns.  This note was electronically signed.    Twana First, MD  08/21/2017 1:06 PM

## 2017-08-23 ENCOUNTER — Other Ambulatory Visit (HOSPITAL_COMMUNITY): Payer: Self-pay | Admitting: *Deleted

## 2017-08-23 ENCOUNTER — Other Ambulatory Visit (HOSPITAL_COMMUNITY): Payer: Self-pay

## 2017-08-23 DIAGNOSIS — D447 Neoplasm of uncertain behavior of aortic body and other paraganglia: Secondary | ICD-10-CM | POA: Diagnosis not present

## 2017-08-23 DIAGNOSIS — Z8249 Family history of ischemic heart disease and other diseases of the circulatory system: Secondary | ICD-10-CM | POA: Diagnosis not present

## 2017-08-23 DIAGNOSIS — Z90710 Acquired absence of both cervix and uterus: Secondary | ICD-10-CM | POA: Diagnosis not present

## 2017-08-23 DIAGNOSIS — Z9049 Acquired absence of other specified parts of digestive tract: Secondary | ICD-10-CM | POA: Diagnosis not present

## 2017-08-23 DIAGNOSIS — Z79890 Hormone replacement therapy (postmenopausal): Secondary | ICD-10-CM | POA: Diagnosis not present

## 2017-08-23 DIAGNOSIS — D649 Anemia, unspecified: Secondary | ICD-10-CM | POA: Diagnosis not present

## 2017-08-23 DIAGNOSIS — E039 Hypothyroidism, unspecified: Secondary | ICD-10-CM | POA: Diagnosis not present

## 2017-08-23 DIAGNOSIS — Z825 Family history of asthma and other chronic lower respiratory diseases: Secondary | ICD-10-CM | POA: Diagnosis not present

## 2017-08-23 DIAGNOSIS — Z885 Allergy status to narcotic agent status: Secondary | ICD-10-CM | POA: Diagnosis not present

## 2017-08-23 DIAGNOSIS — Z79899 Other long term (current) drug therapy: Secondary | ICD-10-CM | POA: Diagnosis not present

## 2017-08-23 DIAGNOSIS — G4730 Sleep apnea, unspecified: Secondary | ICD-10-CM | POA: Diagnosis not present

## 2017-08-23 DIAGNOSIS — E538 Deficiency of other specified B group vitamins: Secondary | ICD-10-CM | POA: Diagnosis not present

## 2017-08-23 DIAGNOSIS — Z7982 Long term (current) use of aspirin: Secondary | ICD-10-CM | POA: Diagnosis not present

## 2017-08-23 DIAGNOSIS — I10 Essential (primary) hypertension: Secondary | ICD-10-CM | POA: Diagnosis not present

## 2017-08-25 LAB — METANEPHRINES, URINE, 24 HOUR
Metaneph Total, Ur: 139 ug/L
Metanephrines, 24H Ur: 153 ug/24 hr (ref 45–290)
NORMETANEPHRINE UR: 463 ug/L
Normetanephrine, 24H Ur: 509 ug/24 hr — ABNORMAL HIGH (ref 82–500)
TOTAL VOLUME: 1100

## 2017-08-30 ENCOUNTER — Encounter: Payer: Self-pay | Admitting: Vascular Surgery

## 2017-08-30 ENCOUNTER — Ambulatory Visit (HOSPITAL_COMMUNITY)
Admission: RE | Admit: 2017-08-30 | Discharge: 2017-08-30 | Disposition: A | Discharge status: Home / Self Care | Payer: PPO | Source: Ambulatory Visit | Attending: Vascular Surgery | Admitting: Vascular Surgery

## 2017-08-30 ENCOUNTER — Other Ambulatory Visit: Payer: Self-pay

## 2017-08-30 ENCOUNTER — Ambulatory Visit (INDEPENDENT_AMBULATORY_CARE_PROVIDER_SITE_OTHER): Payer: PPO | Admitting: Vascular Surgery

## 2017-08-30 VITALS — BP 134/71 | HR 58 | Temp 97.9°F | Resp 16 | Ht 64.0 in | Wt 157.5 lb

## 2017-08-30 DIAGNOSIS — D446 Neoplasm of uncertain behavior of carotid body: Secondary | ICD-10-CM | POA: Insufficient documentation

## 2017-08-30 DIAGNOSIS — R58 Hemorrhage, not elsewhere classified: Secondary | ICD-10-CM

## 2017-08-30 LAB — VAS US CAROTID
LCCADDIAS: -20 cm/s
LCCADSYS: -91 cm/s
LCCAPDIAS: -14 cm/s
LCCAPSYS: -102 cm/s
LEFT ECA DIAS: -8 cm/s
LEFT VERTEBRAL DIAS: -9 cm/s
LICADDIAS: -26 cm/s
LICADSYS: -96 cm/s
LICAPSYS: -57 cm/s
Left ICA prox dias: -18 cm/s
RCCAPSYS: 102 cm/s
RIGHT CCA MID DIAS: 21 cm/s
RIGHT ECA DIAS: 0 cm/s
RIGHT VERTEBRAL DIAS: -23 cm/s
Right CCA prox dias: 12 cm/s
Right cca dist sys: -92 cm/s

## 2017-08-30 NOTE — Progress Notes (Signed)
Patient name: Heather Cobb MRN: [id number] DOB: 09-08-1941 Sex: female   REASON FOR CONSULT:    Left carotid body tumor.  The consult is requested by Dr. Talbert Cage.  HPI:   Heather Cobb is a pleasant 76 y.o. female, who felt a small mass in her right neck.  This prompted a CT scan.  No mass was found on the right, however an incidental finding was a mass in the left neck that likely represented a carotid body tumor.  The patient sent for vascular consultation.  The patient has had some occasional headaches but is otherwise asymptomatic.  She denies any flushing or palpitations.  She denies any history of stroke, TIAs, expressive or receptive aphasia, or amaurosis fugax.  Her past medical history is significant for hypertension.  She denies any history of diabetes, hypercholesterolemia, history of myocardial infarction or history of congestive heart failure.  Past Medical History:  Diagnosis Date  . Anemia   . Hypertension   . Hypothyroid   . Pyelonephritis   . Sleep apnea    Stop Bang score of 4  . Vertigo   . Vitamin B 12 deficiency     Family History  Problem Relation Age of Onset  . Asthma Mother   . Hypertension Daughter   . Cancer Neg Hx   There is no family history of premature cardiovascular disease.  SOCIAL HISTORY: She has never smoked. Social History   Socioeconomic History  . Marital status: Widowed    Spouse name: Not on file  . Number of children: Not on file  . Years of education: Not on file  . Highest education level: Not on file  Social Needs  . Financial resource strain: Not on file  . Food insecurity - worry: Not on file  . Food insecurity - inability: Not on file  . Transportation needs - medical: Not on file  . Transportation needs - non-medical: Not on file  Occupational History  . Not on file  Tobacco Use  . Smoking status: Never Smoker  . Smokeless tobacco: Never Used  Substance and Sexual Activity  . Alcohol use: No    Alcohol/week:  0.0 oz  . Drug use: No  . Sexual activity: Yes    Birth control/protection: Surgical  Other Topics Concern  . Not on file  Social History Narrative   ** Merged History Encounter **        Allergies  Allergen Reactions  . Codeine Nausea Only and Other (See Comments)    fever    Current Outpatient Medications  Medication Sig Dispense Refill  . aspirin EC 81 MG tablet Take 81 mg by mouth daily.    . cholecalciferol (VITAMIN D) 1000 UNITS tablet Take 1,000 Units by mouth daily.    . ferrous fumarate (HEMOCYTE - 106 MG FE) 325 (106 FE) MG TABS Take 1 tablet (106 mg of iron total) by mouth daily. 30 each 0  . levothyroxine (SYNTHROID, LEVOTHROID) 88 MCG tablet Take 75 mcg by mouth daily before breakfast.     . Linaclotide (LINZESS PO) Take by mouth.    . olmesartan-hydrochlorothiazide (BENICAR HCT) 40-12.5 MG per tablet Take 1 tablet by mouth daily.    . vitamin B-12 (CYANOCOBALAMIN) 100 MCG tablet Take 100 mcg by mouth daily.     No current facility-administered medications for this visit.     REVIEW OF SYSTEMS:  [X]  denotes positive finding, [ ]  denotes negative finding Cardiac  Comments:  Chest pain or  chest pressure:    Shortness of breath upon exertion: X   Short of breath when lying flat:    Irregular heart rhythm:        Vascular    Pain in calf, thigh, or hip brought on by ambulation:    Pain in feet at night that wakes you up from your sleep:     Blood clot in your veins:    Leg swelling:         Pulmonary    Oxygen at home:    Productive cough:     Wheezing:         Neurologic    Sudden weakness in arms or legs:     Sudden numbness in arms or legs:     Sudden onset of difficulty speaking or slurred speech:    Temporary loss of vision in one eye:     Problems with dizziness:         Gastrointestinal    Blood in stool:     Vomited blood:         Genitourinary    Burning when urinating:     Blood in urine:        Psychiatric    Major depression:          Hematologic    Bleeding problems:    Problems with blood clotting too easily:        Skin    Rashes or ulcers:        Constitutional    Fever or chills:     PHYSICAL EXAM:   Vitals:   08/30/17 1206 08/30/17 1209  BP: (!) 143/80 134/71  Pulse: (!) 58   Resp: 16   Temp: 97.9 F (36.6 C)   TempSrc: Oral   SpO2: 100%   Weight: 157 lb 8 oz (71.4 kg)   Height: 5\' 4"  (1.626 m)     GENERAL: The patient is a well-nourished female, in no acute distress. The vital signs are documented above. HEENT: I cannot palpate a mass on either side of the neck. CARDIAC: There is a regular rate and rhythm.  VASCULAR: I do not detect carotid bruits. She has palpable femoral and dorsalis pedis pulses bilaterally. She has no significant lower extremity swelling. PULMONARY: There is good air exchange bilaterally without wheezing or rales. ABDOMEN: Soft and non-tender with normal pitched bowel sounds.  MUSCULOSKELETAL: There are no major deformities or cyanosis. NEUROLOGIC: No focal weakness or paresthesias are detected. SKIN: There are no ulcers or rashes noted. PSYCHIATRIC: The patient has a normal affect.  DATA:    CT NECK: I have reviewed the CT of the neck that was done on 08/15/2017.  This shows an 11 mm by 9 mm mass centered at the left carotid bifurcation which is likely a carotid body tumor.  No mass was identified on the right side of the neck where the patient had felt a mass.  There was a heterogeneously enlarged thyroid gland probably representing the sequelae of thyroiditis.  CAROTID DUPLEX: I have independently interpreted her carotid duplex scan today.  This shows no significant carotid stenosis on either side.  There is a small well-defined vascularized structure measuring 1.2 cm x 1.1 cm consistent with a carotid body tumor.  MEDICAL ISSUES:   LEFT CAROTID BODY TUMOR: This patient has a left carotid body tumor.  Although she is asymptomatic I would recommend elective  of the  tumor given the risk for continued enlargement.  I have explained  that these tumors are rare and that they are also rarely malignant.  They are typically asymptomatic.  I explained that we typically do this as a team approach with an ENT.  I have spoken with Dr. Melida Quitter today who will see the patient in consultation.  We both agree that preoperative embolization of the carotid body tumor if at all possible would be helpful.  I have placed a call to radiology and will speak to Dr. Estanislado Pandy in order to review this plan.  I have discussed with her what would be involved with the surgery and the potential complications including, but not limited to bleeding, wound healing problems, nerve injury, and stroke.  We will make further recommendations pending her consultation with Dr. Redmond Baseman and with Dr. Estanislado Pandy.   [name] [facility] [phone]

## 2017-08-31 NOTE — Addendum Note (Signed)
Addended by: Lianne Cure A on: 08/31/2017 09:26 AM   Modules accepted: Orders

## 2017-09-01 ENCOUNTER — Other Ambulatory Visit (HOSPITAL_COMMUNITY): Payer: Self-pay

## 2017-09-01 ENCOUNTER — Other Ambulatory Visit (HOSPITAL_COMMUNITY): Payer: Self-pay | Admitting: *Deleted

## 2017-09-01 DIAGNOSIS — D446 Neoplasm of uncertain behavior of carotid body: Secondary | ICD-10-CM

## 2017-09-01 DIAGNOSIS — E860 Dehydration: Secondary | ICD-10-CM

## 2017-09-01 DIAGNOSIS — D447 Neoplasm of uncertain behavior of aortic body and other paraganglia: Secondary | ICD-10-CM | POA: Diagnosis not present

## 2017-09-04 ENCOUNTER — Other Ambulatory Visit (HOSPITAL_COMMUNITY): Payer: Self-pay | Admitting: Interventional Radiology

## 2017-09-04 DIAGNOSIS — D446 Neoplasm of uncertain behavior of carotid body: Secondary | ICD-10-CM | POA: Diagnosis not present

## 2017-09-06 LAB — CATECHOLAMINES,UR.,FREE,24 HR
DOPAMINE, UR, 24HR: 207 ug/(24.h) (ref 0–510)
Dopamine, Rand Ur: 296 ug/L
EPINEPHRINE, U, 24HR: 2 ug/(24.h) (ref 0–20)
Epinephrine, Rand Ur: 3 ug/L
Norepinephrine, Rand Ur: 47 ug/L
Norepinephrine,U,24H: 33 ug/24 hr (ref 0–135)
Total Volume: 700

## 2017-09-21 ENCOUNTER — Ambulatory Visit (HOSPITAL_COMMUNITY)
Admission: RE | Admit: 2017-09-21 | Discharge: 2017-09-21 | Disposition: A | Discharge status: Home / Self Care | Payer: Medicare HMO | Source: Ambulatory Visit | Attending: Interventional Radiology | Admitting: Interventional Radiology

## 2017-09-21 DIAGNOSIS — D446 Neoplasm of uncertain behavior of carotid body: Secondary | ICD-10-CM

## 2017-09-21 HISTORY — PX: IR RADIOLOGIST EVAL & MGMT: IMG5224

## 2017-09-22 ENCOUNTER — Encounter: Payer: PPO | Admitting: Vascular Surgery

## 2017-09-25 ENCOUNTER — Encounter (HOSPITAL_COMMUNITY): Payer: Self-pay | Admitting: Interventional Radiology

## 2017-10-19 ENCOUNTER — Other Ambulatory Visit (HOSPITAL_COMMUNITY): Payer: Self-pay | Admitting: Interventional Radiology

## 2017-10-19 DIAGNOSIS — D446 Neoplasm of uncertain behavior of carotid body: Secondary | ICD-10-CM

## 2017-10-20 ENCOUNTER — Other Ambulatory Visit: Payer: Self-pay | Admitting: Radiology

## 2017-10-23 ENCOUNTER — Other Ambulatory Visit: Payer: Self-pay | Admitting: *Deleted

## 2017-10-23 ENCOUNTER — Ambulatory Visit (HOSPITAL_COMMUNITY): Payer: PPO | Admitting: Oncology

## 2017-10-25 ENCOUNTER — Other Ambulatory Visit: Payer: Self-pay

## 2017-10-25 ENCOUNTER — Encounter (HOSPITAL_COMMUNITY): Payer: Self-pay | Admitting: *Deleted

## 2017-10-25 NOTE — Progress Notes (Signed)
Pt denies SOB, chest pain, and being under the care of a cardiologist. Pt denies having a stress test, echo and cardiac cath. Pt denies having an EKG and chest x ray within the last year. Pt denies recent labs . Pt made aware to stop taking vitamins, fish oil and herbal medications. Do not take any NSAIDs ie: Ibuprofen, Advil, Naproxen (Aleve)., Motrin, BC and [employer]. Pt verbalized understanding of all pre-op instructions. Anesthesia made aware of order for consult.

## 2017-10-26 ENCOUNTER — Inpatient Hospital Stay (HOSPITAL_COMMUNITY)
Admission: AD | Admit: 2017-10-26 | Discharge: 2017-10-28 | DRG: 027 | Disposition: A | Discharge status: Home / Self Care | Payer: Medicare HMO | Source: Ambulatory Visit | Attending: Otolaryngology | Admitting: Otolaryngology

## 2017-10-26 ENCOUNTER — Encounter (HOSPITAL_COMMUNITY): Payer: Self-pay | Admitting: *Deleted

## 2017-10-26 ENCOUNTER — Ambulatory Visit (HOSPITAL_COMMUNITY)
Admission: RE | Admit: 2017-10-26 | Discharge: 2017-10-26 | Disposition: A | Discharge status: Home / Self Care | Payer: Medicare HMO | Source: Ambulatory Visit | Attending: Interventional Radiology | Admitting: Interventional Radiology

## 2017-10-26 ENCOUNTER — Encounter (HOSPITAL_COMMUNITY): Payer: Self-pay | Admitting: Anesthesiology

## 2017-10-26 ENCOUNTER — Ambulatory Visit (HOSPITAL_COMMUNITY): Payer: Medicare HMO | Admitting: Emergency Medicine

## 2017-10-26 ENCOUNTER — Other Ambulatory Visit: Payer: Self-pay

## 2017-10-26 ENCOUNTER — Encounter (HOSPITAL_COMMUNITY)
Admission: AD | Disposition: A | Discharge status: Home / Self Care | Payer: Self-pay | Source: Ambulatory Visit | Attending: Interventional Radiology

## 2017-10-26 DIAGNOSIS — Z9841 Cataract extraction status, right eye: Secondary | ICD-10-CM

## 2017-10-26 DIAGNOSIS — Z7982 Long term (current) use of aspirin: Secondary | ICD-10-CM | POA: Diagnosis not present

## 2017-10-26 DIAGNOSIS — Z79899 Other long term (current) drug therapy: Secondary | ICD-10-CM | POA: Diagnosis not present

## 2017-10-26 DIAGNOSIS — D446 Neoplasm of uncertain behavior of carotid body: Principal | ICD-10-CM | POA: Diagnosis present

## 2017-10-26 DIAGNOSIS — Z961 Presence of intraocular lens: Secondary | ICD-10-CM | POA: Diagnosis present

## 2017-10-26 DIAGNOSIS — G4730 Sleep apnea, unspecified: Secondary | ICD-10-CM | POA: Diagnosis present

## 2017-10-26 DIAGNOSIS — F419 Anxiety disorder, unspecified: Secondary | ICD-10-CM | POA: Diagnosis present

## 2017-10-26 DIAGNOSIS — Z90710 Acquired absence of both cervix and uterus: Secondary | ICD-10-CM

## 2017-10-26 DIAGNOSIS — E039 Hypothyroidism, unspecified: Secondary | ICD-10-CM | POA: Diagnosis present

## 2017-10-26 DIAGNOSIS — I10 Essential (primary) hypertension: Secondary | ICD-10-CM | POA: Diagnosis present

## 2017-10-26 DIAGNOSIS — Z9842 Cataract extraction status, left eye: Secondary | ICD-10-CM

## 2017-10-26 DIAGNOSIS — Z9049 Acquired absence of other specified parts of digestive tract: Secondary | ICD-10-CM | POA: Diagnosis not present

## 2017-10-26 HISTORY — PX: IR ANGIO VERTEBRAL SEL VERTEBRAL UNI R MOD SED: IMG5368

## 2017-10-26 HISTORY — PX: IR NEURO EACH ADD'L AFTER BASIC UNI LEFT (MS): IMG5373

## 2017-10-26 HISTORY — PX: IR ANGIOGRAM EXTREMITY LEFT: IMG651

## 2017-10-26 HISTORY — DX: Neoplasm of uncertain behavior of carotid body: D44.6

## 2017-10-26 HISTORY — PX: IR TRANSCATH/EMBOLIZ: IMG695

## 2017-10-26 HISTORY — PX: IR ANGIOGRAM FOLLOW UP STUDY: IMG697

## 2017-10-26 HISTORY — PX: IR ANGIO INTRA EXTRACRAN SEL COM CAROTID INNOMINATE BILAT MOD SED: IMG5360

## 2017-10-26 HISTORY — PX: RADIOLOGY WITH ANESTHESIA: SHX6223

## 2017-10-26 HISTORY — PX: IR ANGIO EXTERNAL CAROTID SEL EXT CAROTID UNI L MOD SED: IMG5370

## 2017-10-26 LAB — CBC WITH DIFFERENTIAL/PLATELET
BASOS PCT: 0 %
Basophils Absolute: 0 10*3/uL (ref 0.0–0.1)
EOS ABS: 0.2 10*3/uL (ref 0.0–0.7)
Eosinophils Relative: 4 %
HCT: 36.5 % (ref 36.0–46.0)
HEMOGLOBIN: 11.7 g/dL — AB (ref 12.0–15.0)
Lymphocytes Relative: 45 %
Lymphs Abs: 2.2 10*3/uL (ref 0.7–4.0)
MCH: 28.3 pg (ref 26.0–34.0)
MCHC: 32.1 g/dL (ref 30.0–36.0)
MCV: 88.2 fL (ref 78.0–100.0)
MONOS PCT: 9 %
Monocytes Absolute: 0.4 10*3/uL (ref 0.1–1.0)
NEUTROS PCT: 42 %
Neutro Abs: 2.1 10*3/uL (ref 1.7–7.7)
Platelets: 185 10*3/uL (ref 150–400)
RBC: 4.14 MIL/uL (ref 3.87–5.11)
RDW: 13.9 % (ref 11.5–15.5)
WBC: 5 10*3/uL (ref 4.0–10.5)

## 2017-10-26 LAB — COMPREHENSIVE METABOLIC PANEL
ALT: 13 U/L — ABNORMAL LOW (ref 14–54)
AST: 22 U/L (ref 15–41)
Albumin: 3.8 g/dL (ref 3.5–5.0)
Alkaline Phosphatase: 63 U/L (ref 38–126)
Anion gap: 12 (ref 5–15)
BUN: 15 mg/dL (ref 6–20)
CHLORIDE: 104 mmol/L (ref 101–111)
CO2: 24 mmol/L (ref 22–32)
Calcium: 10 mg/dL (ref 8.9–10.3)
Creatinine, Ser: 1.21 mg/dL — ABNORMAL HIGH (ref 0.44–1.00)
GFR calc Af Amer: 49 mL/min — ABNORMAL LOW (ref >60)
GFR, EST NON AFRICAN AMERICAN: 42 mL/min — AB (ref >60)
Glucose, Bld: 111 mg/dL — ABNORMAL HIGH (ref 65–99)
POTASSIUM: 3.3 mmol/L — AB (ref 3.5–5.1)
Sodium: 140 mmol/L (ref 135–145)
Total Bilirubin: 0.6 mg/dL (ref 0.3–1.2)
Total Protein: 6.7 g/dL (ref 6.5–8.1)

## 2017-10-26 LAB — BLOOD GAS, ARTERIAL
ACID-BASE EXCESS: 5.7 mmol/L — AB (ref 0.0–2.0)
Bicarbonate: 29.4 mmol/L — ABNORMAL HIGH (ref 20.0–28.0)
O2 Saturation: 98.4 %
PH ART: 7.471 — AB (ref 7.350–7.450)
Patient temperature: 98.6
pCO2 arterial: 40.8 mmHg (ref 32.0–48.0)
pO2, Arterial: 113 mmHg — ABNORMAL HIGH (ref 83.0–108.0)

## 2017-10-26 LAB — POCT ACTIVATED CLOTTING TIME
ACTIVATED CLOTTING TIME: 164 s
Activated Clotting Time: 202 seconds

## 2017-10-26 LAB — URINALYSIS, ROUTINE W REFLEX MICROSCOPIC
BILIRUBIN URINE: NEGATIVE
Glucose, UA: NEGATIVE mg/dL
Hgb urine dipstick: NEGATIVE
Ketones, ur: NEGATIVE mg/dL
Nitrite: POSITIVE — AB
Protein, ur: NEGATIVE mg/dL
Specific Gravity, Urine: 1.018 (ref 1.005–1.030)
pH: 5 (ref 5.0–8.0)

## 2017-10-26 LAB — PROTIME-INR
INR: 1.11
PROTHROMBIN TIME: 14.3 s (ref 11.4–15.2)

## 2017-10-26 LAB — MRSA PCR SCREENING: MRSA BY PCR: NEGATIVE

## 2017-10-26 LAB — APTT: APTT: 40 s — AB (ref 24–36)

## 2017-10-26 SURGERY — IR WITH ANESTHESIA
Anesthesia: General

## 2017-10-26 MED ORDER — OXYCODONE HCL 5 MG/5ML PO SOLN: 5.0000 mg | Freq: Once | ORAL | Status: DC | PRN

## 2017-10-26 MED ORDER — CEFAZOLIN SODIUM-DEXTROSE 2-4 GM/100ML-% IV SOLN
2.0000 g | INTRAVENOUS | Status: AC
  Administered 2017-10-26: 2 g via INTRAVENOUS
  Filled 2017-10-26: qty 100

## 2017-10-26 MED ORDER — SODIUM CHLORIDE 0.9 % IV SOLN
INTRAVENOUS | Status: DC
  Administered 2017-10-26: 08:00:00 via INTRAVENOUS

## 2017-10-26 MED ORDER — NICARDIPINE HCL IN NACL 20-0.86 MG/200ML-% IV SOLN
0.0000 mg/h | INTRAVENOUS | Status: DC
  Administered 2017-10-26: 5 mg/h via INTRAVENOUS
  Filled 2017-10-26: qty 200

## 2017-10-26 MED ORDER — ESMOLOL HCL 100 MG/10ML IV SOLN
INTRAVENOUS | Status: DC | PRN
  Administered 2017-10-26: 40 mg via INTRAVENOUS
  Administered 2017-10-26: 60 mg via INTRAVENOUS

## 2017-10-26 MED ORDER — IOPAMIDOL (ISOVUE-300) INJECTION 61%
INTRAVENOUS | Status: AC
  Administered 2017-10-26: 15 mL
  Filled 2017-10-26: qty 150

## 2017-10-26 MED ORDER — ONDANSETRON HCL 4 MG/2ML IJ SOLN
INTRAMUSCULAR | Status: AC
  Administered 2017-10-26: 4 mg
  Filled 2017-10-26: qty 2

## 2017-10-26 MED ORDER — SODIUM CHLORIDE 0.9 % IV SOLN
INTRAVENOUS | Status: DC
  Administered 2017-10-26 (×3): via INTRAVENOUS

## 2017-10-26 MED ORDER — NITROGLYCERIN 1 MG/10 ML FOR IR/CATH LAB
INTRA_ARTERIAL | Status: AC
  Filled 2017-10-26: qty 10

## 2017-10-26 MED ORDER — CEFAZOLIN SODIUM-DEXTROSE 2-4 GM/100ML-% IV SOLN
INTRAVENOUS | Status: AC
  Filled 2017-10-26: qty 100

## 2017-10-26 MED ORDER — LIDOCAINE HCL 1 % IJ SOLN
INTRAMUSCULAR | Status: AC
  Filled 2017-10-26: qty 20

## 2017-10-26 MED ORDER — SODIUM CHLORIDE 0.9 % IJ SOLN
INTRAVENOUS | Status: AC | PRN
  Administered 2017-10-26 (×2): 25 ug via INTRA_ARTERIAL

## 2017-10-26 MED ORDER — ONDANSETRON HCL 4 MG/2ML IJ SOLN
INTRAMUSCULAR | Status: DC | PRN
  Administered 2017-10-26: 4 mg via INTRAVENOUS

## 2017-10-26 MED ORDER — HEPARIN SODIUM (PORCINE) 1000 UNIT/ML IJ SOLN
INTRAMUSCULAR | Status: DC | PRN
  Administered 2017-10-26: 2000 [IU] via INTRAVENOUS
  Administered 2017-10-26: 1000 [IU] via INTRAVENOUS
  Administered 2017-10-26: 500 [IU] via INTRAVENOUS

## 2017-10-26 MED ORDER — ASPIRIN 81 MG PO CHEW
CHEWABLE_TABLET | ORAL | Status: AC
  Filled 2017-10-26: qty 3

## 2017-10-26 MED ORDER — HYDROMORPHONE HCL 1 MG/ML IJ SOLN: 0.2500 mg | INTRAMUSCULAR | Status: DC | PRN

## 2017-10-26 MED ORDER — MEPERIDINE HCL 50 MG/ML IJ SOLN: 6.2500 mg | INTRAMUSCULAR | Status: DC | PRN

## 2017-10-26 MED ORDER — PROPOFOL 10 MG/ML IV BOLUS
INTRAVENOUS | Status: DC | PRN
  Administered 2017-10-26: 120 mg via INTRAVENOUS

## 2017-10-26 MED ORDER — ACETAMINOPHEN 325 MG PO TABS: 650.0000 mg | ORAL_TABLET | ORAL | Status: DC | PRN

## 2017-10-26 MED ORDER — SUGAMMADEX SODIUM 200 MG/2ML IV SOLN
INTRAVENOUS | Status: DC | PRN
  Administered 2017-10-26: 100 mg via INTRAVENOUS
  Administered 2017-10-26: 200 mg via INTRAVENOUS

## 2017-10-26 MED ORDER — IOPAMIDOL (ISOVUE-300) INJECTION 61%
INTRAVENOUS | Status: AC
  Filled 2017-10-26: qty 150

## 2017-10-26 MED ORDER — ROCURONIUM BROMIDE 10 MG/ML (PF) SYRINGE
PREFILLED_SYRINGE | INTRAVENOUS | Status: DC | PRN
  Administered 2017-10-26: 50 mg via INTRAVENOUS

## 2017-10-26 MED ORDER — PHENYLEPHRINE 40 MCG/ML (10ML) SYRINGE FOR IV PUSH (FOR BLOOD PRESSURE SUPPORT)
PREFILLED_SYRINGE | INTRAVENOUS | Status: DC | PRN
  Administered 2017-10-26: 80 ug via INTRAVENOUS

## 2017-10-26 MED ORDER — ACETAMINOPHEN 650 MG RE SUPP: 650.0000 mg | RECTAL | Status: DC | PRN

## 2017-10-26 MED ORDER — DEXAMETHASONE SODIUM PHOSPHATE 10 MG/ML IJ SOLN
INTRAMUSCULAR | Status: DC | PRN
  Administered 2017-10-26: 10 mg via INTRAVENOUS

## 2017-10-26 MED ORDER — FENTANYL CITRATE (PF) 250 MCG/5ML IJ SOLN
INTRAMUSCULAR | Status: DC | PRN
  Administered 2017-10-26: 75 ug via INTRAVENOUS
  Administered 2017-10-26: 25 ug via INTRAVENOUS

## 2017-10-26 MED ORDER — PROMETHAZINE HCL 25 MG/ML IJ SOLN: 6.2500 mg | INTRAMUSCULAR | Status: DC | PRN

## 2017-10-26 MED ORDER — ASPIRIN EC 325 MG PO TBEC
325.0000 mg | DELAYED_RELEASE_TABLET | ORAL | Status: AC
  Administered 2017-10-26: 243 mg via ORAL
  Filled 2017-10-26: qty 1

## 2017-10-26 MED ORDER — LIDOCAINE HCL 1 % IJ SOLN
INTRAMUSCULAR | Status: AC | PRN
  Administered 2017-10-26: 15 mL

## 2017-10-26 MED ORDER — PHENYLEPHRINE HCL 10 MG/ML IJ SOLN
INTRAVENOUS | Status: DC | PRN
  Administered 2017-10-26: 20 ug/min via INTRAVENOUS
  Administered 2017-10-26: 60 ug/min via INTRAVENOUS

## 2017-10-26 MED ORDER — MIDAZOLAM HCL 2 MG/2ML IJ SOLN
INTRAMUSCULAR | Status: DC | PRN
  Administered 2017-10-26 (×2): 1 mg via INTRAVENOUS

## 2017-10-26 MED ORDER — ACETAMINOPHEN 160 MG/5ML PO SOLN: 650.0000 mg | ORAL | Status: DC | PRN

## 2017-10-26 MED ORDER — OXYCODONE HCL 5 MG PO TABS: 5.0000 mg | ORAL_TABLET | Freq: Once | ORAL | Status: DC | PRN

## 2017-10-26 MED ORDER — LABETALOL HCL 5 MG/ML IV SOLN
INTRAVENOUS | Status: DC | PRN
  Administered 2017-10-26 (×2): 5 mg via INTRAVENOUS

## 2017-10-26 MED ORDER — IOPAMIDOL (ISOVUE-300) INJECTION 61%
INTRAVENOUS | Status: AC
  Administered 2017-10-26: 75 mL
  Filled 2017-10-26: qty 150

## 2017-10-26 NOTE — H&P (Deleted)
  The note originally documented on this encounter has been moved the the encounter in which it belongs.  

## 2017-10-26 NOTE — Sedation Documentation (Signed)
6 Fr. angioseal to right groin

## 2017-10-26 NOTE — H&P (Signed)
Chief Complaint: Patient was seen in consultation today for carotid body tumor  Supervising Physician: Luanne Bras  Patient Status: [facility] - Out-pt  History of Present Illness: Heather Cobb is a 77 y.o. female with past medical history of HTN, and carotid body tumor who presents today for angiogram and possible embolization of hypervascularity within the tumor.  She has met with Dr. Estanislado Pandy in consultation on 09/21/17 to discuss procedure.  She elected to proceed and presents to radiology department today for procedure.  She is accompanied by family who reports she has no complaints and was in her usual state of health upon arrival this morning.  She has been NPO.  She does not take blood thinners.   Past Medical History:  Diagnosis Date  . Anemia   . Carotid body tumor ([place])    left  . Hypertension   . Hypothyroid   . Pyelonephritis   . Sleep apnea    Stop Bang score of 4  . Vertigo   . Vitamin B 12 deficiency     Past Surgical History:  Procedure Laterality Date  . ABDOMINAL HYSTERECTOMY    . CATARACT EXTRACTION W/ INTRAOCULAR LENS  IMPLANT, BILATERAL    . CHOLECYSTECTOMY N/A 04/15/2015   Procedure: LAPAROSCOPIC CHOLECYSTECTOMY;  Surgeon: Aviva Signs, MD;  Location: [facility];  Service: General;  Laterality: N/A;  . COLONOSCOPY  2012   [place]  . COLONOSCOPY WITH PROPOFOL N/A 02/03/2016   Procedure: COLONOSCOPY WITH PROPOFOL;  Surgeon: Robert Bellow, MD;  Location: [facility] ENDOSCOPY;  Service: Endoscopy;  Laterality: N/A;  . IR RADIOLOGIST EVAL & MGMT  09/21/2017  . UPPER GASTROINTESTINAL ENDOSCOPY  2014  . VESICOVAGINAL FISTULA CLOSURE W/ TAH      Allergies: Codeine  Medications: Prior to Admission medications   Medication Sig Start Date End Date Taking? Authorizing Provider  aspirin EC 81 MG tablet Take 81 mg by mouth daily.    [provider]  cholecalciferol (VITAMIN D) 1000 UNITS tablet Take 1,000 Units by mouth daily.    [provider]  ferrous fumarate (HEMOCYTE - 106 MG FE) 325 (106 FE) MG TABS Take 1 tablet (106 mg of iron total) by mouth daily. 02/05/13   Sinda Du, MD  levothyroxine (SYNTHROID, LEVOTHROID) 88 MCG tablet Take 88 mcg by mouth daily before breakfast.     [provider]  linaclotide (LINZESS) 145 MCG CAPS capsule Take 145 mcg by mouth daily as needed (constipation).    [provider]  naproxen sodium (ALEVE) 220 MG tablet Take 220 mg by mouth daily as needed (headaches).    [provider]  olmesartan-hydrochlorothiazide (BENICAR HCT) 40-12.5 MG per tablet Take 1 tablet by mouth daily.    [provider]  vitamin B-12 (CYANOCOBALAMIN) 100 MCG tablet Take 100 mcg by mouth daily.    [provider]     Family History  Problem Relation Age of Onset  . Asthma Mother   . Hypertension Daughter   . Cancer Neg Hx     Social History   Socioeconomic History  . Marital status: Widowed    Spouse name: Not on file  . Number of children: Not on file  . Years of education: Not on file  . Highest education level: Not on file  Social Needs  . Financial resource strain: Not on file  . Food insecurity - worry: Not on file  . Food insecurity - inability: Not on file  . Transportation needs - medical: Not  on file  . Transportation needs - non-medical: Not on file  Occupational History  . Not on file  Tobacco Use  . Smoking status: Never Smoker  . Smokeless tobacco: Never Used  Substance and Sexual Activity  . Alcohol use: No    Alcohol/week: 0.0 oz  . Drug use: No  . Sexual activity: Yes    Birth control/protection: Surgical  Other Topics Concern  . Not on file  Social History Narrative   ** Merged History Encounter **        Review of Systems  Unable to perform ROS: Other  Patient sedated.   Vital Signs: There were no vitals taken for this visit.  Physical Exam  Constitutional: She is oriented to person, place, and time. She  appears well-developed.  Cardiovascular: Normal rate, regular rhythm and normal heart sounds.  Pulmonary/Chest: Effort normal and breath sounds normal. No respiratory distress.  Abdominal: Soft.  Neurological: She is alert and oriented to person, place, and time.  Skin: Skin is warm and dry.  Psychiatric: She has a normal mood and affect. Her behavior is normal. Judgment and thought content normal.  Nursing note and vitals reviewed.   Imaging: No results found.  Labs:  CBC: Recent Labs    10/26/17 0707  WBC 5.0  HGB 11.7*  HCT 36.5  PLT 185    COAGS: Recent Labs    10/26/17 0707  INR 1.11  APTT 40*    BMP: Recent Labs    08/15/17 1441 10/26/17 0707  NA  --  140  K  --  3.3*  CL  --  104  CO2  --  24  GLUCOSE  --  111*  BUN  --  15  CALCIUM  --  10.0  CREATININE 1.20* 1.21*  GFRNONAA  --  42*  GFRAA  --  49*    LIVER FUNCTION TESTS: Recent Labs    10/26/17 0707  BILITOT 0.6  AST 22  ALT 13*  ALKPHOS 63  PROT 6.7  ALBUMIN 3.8    TUMOR MARKERS: No results for input(s): AFPTM, CEA, CA199, CHROMGRNA in the last 8760 hours.  Assessment and Plan: Patient with past medical history of HTN, carotid body tumor presents for angiogram with possible intervention.  Case reviewed by Dr. Estanislado Pandy who approves patient for procedure and has spoken with patient and her family in consultation.  Patient presents today in their usual state of health per her family report. Patient was given versed prior to arterial line insertion due to anxiety. She has been NPO and is not currently on blood thinners.  Risks and benefits were discussed with the patient including, but not limited to bleeding, infection, vascular injury or contrast induced renal failure at the time of consultation with Dr. Estanislado Pandy.  Reviewed risks with family.  Patient has already signed consent.  This interventional procedure involves the use of X-rays and because of the nature of the planned  procedure, it is possible that we will have prolonged use of X-ray fluoroscopy.  Potential radiation risks to you include (but are not limited to) the following: - A slightly elevated risk for cancer  several years later in life. This risk is typically less than 0.5% percent. This risk is low in comparison to the normal incidence of human cancer, which is 33% for women and 50% for men according to the [place]. - Radiation induced injury can include skin redness, resembling a rash, tissue breakdown / ulcers and hair loss (which  can be temporary or permanent).   The likelihood of either of these occurring depends on the difficulty of the procedure and whether you are sensitive to radiation due to previous procedures, disease, or genetic conditions.   IF your procedure requires a prolonged use of radiation, you will be notified and given written instructions for further action.  It is your responsibility to monitor the irradiated area for the 2 weeks following the procedure and to notify your physician if you are concerned that you have suffered a radiation induced injury.    Consent signed and in chart.  Thank you for this interesting consult.  I greatly enjoyed meeting Heather Cobb and look forward to participating in their care.  A copy of this report was sent to the requesting provider on this date.  Electronically Signed: Docia Barrier, PA 10/26/2017, 8:25 AM   I spent a total of  30 Minutes   in face to face in clinical consultation, greater than 50% of which was counseling/coordinating care for carotid body tumor.

## 2017-10-26 NOTE — Progress Notes (Signed)
VASCULAR SURGERY:  The patient is doing well status post coil embolization of her left carotid body tumor.  She is all set for tomorrow.  We have discussed the procedure and potential complications and she is agreeable to proceed.  [name], MD, [place] [phone] Office: [phone]

## 2017-10-26 NOTE — Transfer of Care (Signed)
Immediate Anesthesia Transfer of Care Note  Patient: Heather Cobb  Procedure(s) Performed: EMBOLIZATION (N/A )  Patient Location: PACU  Anesthesia Type:General  Level of Consciousness: awake, alert  and oriented  Airway & Oxygen Therapy: Patient Spontanous Breathing and Patient connected to nasal cannula oxygen  Post-op Assessment: Report given to RN and Post -op Vital signs reviewed and stable  Post vital signs: Reviewed and stable  Last Vitals:  Vitals:   10/26/17 0821 10/26/17 0822  BP:    Pulse: (!) 57 (!) 59  Resp: 17 17  Temp:    SpO2: 100% 100%    Last Pain:  Vitals:   10/26/17 0614  TempSrc: Oral      Patients Stated Pain Goal: 0 (76/73/41 [id number])  Complications: No apparent anesthesia complications

## 2017-10-26 NOTE — Progress Notes (Signed)
Pt having low SBPs outside of BP parameters after cardene restarted and then stopped shortly after. Pt neuro intact.  Dr. Estanislado Pandy notified, advised to encourage fluids and allow time.  No new orders given.  Will continue to monitor.

## 2017-10-26 NOTE — Anesthesia Postprocedure Evaluation (Signed)
Anesthesia Post Note  Patient: Heather Cobb  Procedure(s) Performed: EMBOLIZATION (N/A )     Patient location during evaluation: PACU Anesthesia Type: General Level of consciousness: awake and alert Pain management: pain level controlled Vital Signs Assessment: post-procedure vital signs reviewed and stable Respiratory status: spontaneous breathing, nonlabored ventilation and respiratory function stable Cardiovascular status: blood pressure returned to baseline and stable Postop Assessment: no apparent nausea or vomiting Anesthetic complications: no    Last Vitals:  Vitals:   10/26/17 1300 10/26/17 1310  BP: (!) 114/56   Pulse: 66   Resp: 16   Temp: 36.4 C 36.9 C  SpO2: 95%     Last Pain:  Vitals:   10/26/17 1310  TempSrc: Oral                 Lynda Rainwater

## 2017-10-26 NOTE — Progress Notes (Signed)
Patient doing well s/p embolization of carotid body tumor.  She has no complaints except she wants her foley out.  She denies HA, change in taste, or any other complaints.  PE: Neuro: intact with no deficits Skin: R CFA site is c/d/i  A/P: 1. S/p embolization of carotid body tumor  Plan to DC foley if she no longer needs it.  May advance diet tonight.  Likely NPO p MN for OR tomorrow. Dr. Estanislado Pandy has left a message for Dr. Scot Dock to make him aware this patient is here and ready for tomorrow.  Henreitta Cea 3:43 PM 10/26/2017

## 2017-10-26 NOTE — H&P (View-Only) (Signed)
VASCULAR SURGERY:  The patient is doing well status post coil embolization of her left carotid body tumor.  She is all set for tomorrow.  We have discussed the procedure and potential complications and she is agreeable to proceed.  [name], MD, [place] [phone] Office: [phone]

## 2017-10-26 NOTE — Anesthesia Procedure Notes (Addendum)
Procedure Name: Intubation Date/Time: 10/26/2017 10:33 AM Performed by: Lynda Rainwater, MD Pre-anesthesia Checklist: Patient identified, Emergency Drugs available, Suction available and Patient being monitored Patient Re-evaluated:Patient Re-evaluated prior to induction Oxygen Delivery Method: Circle System Utilized Preoxygenation: Pre-oxygenation with 100% oxygen Induction Type: IV induction Ventilation: Mask ventilation without difficulty Laryngoscope Size: Mac and 4 Grade View: Grade II Tube type: Oral Tube size: 7.5 mm Number of attempts: 1 Airway Equipment and Method: Stylet Placement Confirmation: ETT inserted through vocal cords under direct vision,  positive ETCO2 and breath sounds checked- equal and bilateral Secured at: 21 cm Tube secured with: Tape Dental Injury: Teeth and Oropharynx as per pre-operative assessment

## 2017-10-26 NOTE — Procedures (Signed)
S/P bilateral common carotid and Rt vertebral artery , and Lt subclavian arteriograms followed by embolization of Lt carotid tumor with PVA particles 350 to 500 microns and coil with 80 to 90 % devascularization of tumor. . Occluded LT VA at origin

## 2017-10-26 NOTE — Progress Notes (Signed)
Responded to [facility] to support patient and provide prayer per her request.  Patient nodding in and out.  Provided presence and prayer. Chaplain available as needed.  Jaclynn Major, [place], [facility], Pager [phone]

## 2017-10-26 NOTE — Anesthesia Preprocedure Evaluation (Signed)
Anesthesia Evaluation  Patient identified by MRN, date of birth, ID band Patient awake    Reviewed: Allergy & Precautions, NPO status , Patient's Chart, lab work & pertinent test results, reviewed documented beta blocker date and time   Airway Mallampati: II  TM Distance: >3 FB Neck ROM: Full    Dental no notable dental hx. (+) Chipped, Partial Lower, Partial Upper   Pulmonary sleep apnea ,    Pulmonary exam normal breath sounds clear to auscultation       Cardiovascular hypertension, Pt. on medications Normal cardiovascular exam Rhythm:Regular Rate:Normal     Neuro/Psych    GI/Hepatic   Endo/Other  Hypothyroidism   Renal/GU Renal InsufficiencyRenal disease     Musculoskeletal   Abdominal   Peds  Hematology  (+) anemia ,   Anesthesia Other Findings   Reproductive/Obstetrics                             Anesthesia Physical  Anesthesia Plan  ASA: III  Anesthesia Plan: General   Post-op Pain Management:    Induction: Intravenous  PONV Risk Score and Plan: 3 and Ondansetron, Dexamethasone and Midazolam  Airway Management Planned: Oral ETT  Additional Equipment:   Intra-op Plan:   Post-operative Plan: Extubation in OR  Informed Consent: I have reviewed the patients History and Physical, chart, labs and discussed the procedure including the risks, benefits and alternatives for the proposed anesthesia with the patient or authorized representative who has indicated his/her understanding and acceptance.   Dental advisory given  Plan Discussed with: CRNA  Anesthesia Plan Comments:         Anesthesia Quick Evaluation

## 2017-10-26 NOTE — Anesthesia Procedure Notes (Addendum)
Arterial Line Insertion Start/End2/03/2018 8:10 AM Performed by: Valda Favia, CRNA, CRNA  Patient location: Pre-op. Preanesthetic checklist: patient identified, IV checked, site marked, risks and benefits discussed, surgical consent, monitors and equipment checked, pre-op evaluation, timeout performed and anesthesia consent Lidocaine 1% used for infiltration and patient sedated Left, radial was placed Catheter size: 20 Fr Hand hygiene performed  and maximum sterile barriers used   Attempts: 2 Procedure performed without using ultrasound guided technique. Following insertion, dressing applied and Biopatch. Post procedure assessment: normal and unchanged  Patient tolerated the procedure well with no immediate complications.

## 2017-10-26 NOTE — Progress Notes (Signed)
Patient ID: Heather Cobb, female   DOB: [birth year], 77 y.o.   MRN: [id number] INR. Extubated without difficulty. Maintaining PAO2 with nasal cannulae Denies any H/AS,N/V  . Able to identify objects  in front of her. Pupils 2 to 3 mm sluggishly reactive. No facial asymmetry. Tongue in the midline. No pronation drift. Moves all 4s equally. Rt groin soft  No hematoma . Pulses DPs papable bilaterally.PTs both dopplerable. Heather Cobb.MD

## 2017-10-27 ENCOUNTER — Inpatient Hospital Stay (HOSPITAL_COMMUNITY): Admission: RE | Admit: 2017-10-27 | Payer: Medicare HMO | Source: Ambulatory Visit | Admitting: Vascular Surgery

## 2017-10-27 ENCOUNTER — Encounter (HOSPITAL_COMMUNITY)
Admission: AD | Disposition: A | Discharge status: Home / Self Care | Payer: Self-pay | Source: Ambulatory Visit | Attending: Interventional Radiology

## 2017-10-27 ENCOUNTER — Inpatient Hospital Stay (HOSPITAL_COMMUNITY): Payer: Medicare HMO | Admitting: Anesthesiology

## 2017-10-27 ENCOUNTER — Encounter (HOSPITAL_COMMUNITY): Payer: Self-pay | Admitting: *Deleted

## 2017-10-27 HISTORY — PX: MASS EXCISION: SHX2000

## 2017-10-27 LAB — PREPARE RBC (CROSSMATCH)

## 2017-10-27 LAB — CBC WITH DIFFERENTIAL/PLATELET
BASOS ABS: 0 10*3/uL (ref 0.0–0.1)
BASOS PCT: 0 %
EOS ABS: 0 10*3/uL (ref 0.0–0.7)
EOS PCT: 0 %
HCT: 32.2 % — ABNORMAL LOW (ref 36.0–46.0)
HEMOGLOBIN: 10.3 g/dL — AB (ref 12.0–15.0)
LYMPHS ABS: 0.7 10*3/uL (ref 0.7–4.0)
Lymphocytes Relative: 6 %
MCH: 28.2 pg (ref 26.0–34.0)
MCHC: 32 g/dL (ref 30.0–36.0)
MCV: 88.2 fL (ref 78.0–100.0)
Monocytes Absolute: 0.5 10*3/uL (ref 0.1–1.0)
Monocytes Relative: 4 %
NEUTROS PCT: 90 %
Neutro Abs: 11.9 10*3/uL — ABNORMAL HIGH (ref 1.7–7.7)
PLATELETS: 181 10*3/uL (ref 150–400)
RBC: 3.65 MIL/uL — AB (ref 3.87–5.11)
RDW: 13.9 % (ref 11.5–15.5)
WBC: 13.1 10*3/uL — AB (ref 4.0–10.5)

## 2017-10-27 LAB — BASIC METABOLIC PANEL
Anion gap: 10 (ref 5–15)
BUN: 14 mg/dL (ref 6–20)
CHLORIDE: 112 mmol/L — AB (ref 101–111)
CO2: 20 mmol/L — ABNORMAL LOW (ref 22–32)
CREATININE: 1.38 mg/dL — AB (ref 0.44–1.00)
Calcium: 8.8 mg/dL — ABNORMAL LOW (ref 8.9–10.3)
GFR, EST AFRICAN AMERICAN: 42 mL/min — AB (ref >60)
GFR, EST NON AFRICAN AMERICAN: 36 mL/min — AB (ref >60)
Glucose, Bld: 147 mg/dL — ABNORMAL HIGH (ref 65–99)
Potassium: 3.7 mmol/L (ref 3.5–5.1)
SODIUM: 142 mmol/L (ref 135–145)

## 2017-10-27 LAB — SURGICAL PCR SCREEN
MRSA, PCR: NEGATIVE
Staphylococcus aureus: NEGATIVE

## 2017-10-27 LAB — APTT: aPTT: 40 seconds — ABNORMAL HIGH (ref 24–36)

## 2017-10-27 SURGERY — EXCISION MASS
Anesthesia: General | Laterality: Left

## 2017-10-27 MED ORDER — ROCURONIUM BROMIDE 100 MG/10ML IV SOLN
INTRAVENOUS | Status: DC | PRN
  Administered 2017-10-27: 10 mg via INTRAVENOUS
  Administered 2017-10-27: 50 mg via INTRAVENOUS

## 2017-10-27 MED ORDER — LINACLOTIDE 145 MCG PO CAPS
145.0000 ug | ORAL_CAPSULE | Freq: Every day | ORAL | Status: DC | PRN
  Filled 2017-10-27: qty 1

## 2017-10-27 MED ORDER — PROMETHAZINE HCL 25 MG/ML IJ SOLN
INTRAMUSCULAR | Status: AC
  Filled 2017-10-27: qty 1

## 2017-10-27 MED ORDER — NEOSTIGMINE METHYLSULFATE 10 MG/10ML IV SOLN
INTRAVENOUS | Status: DC | PRN
  Administered 2017-10-27: 3 mg via INTRAVENOUS

## 2017-10-27 MED ORDER — LIDOCAINE-EPINEPHRINE 1 %-1:100000 IJ SOLN
INTRAMUSCULAR | Status: DC | PRN
  Administered 2017-10-27: 20 mL

## 2017-10-27 MED ORDER — CHLORHEXIDINE GLUCONATE CLOTH 2 % EX PADS: 6.0000 | MEDICATED_PAD | Freq: Once | CUTANEOUS | Status: DC

## 2017-10-27 MED ORDER — PHENYLEPHRINE HCL 10 MG/ML IJ SOLN
INTRAVENOUS | Status: DC | PRN
  Administered 2017-10-27: 50 ug/min via INTRAVENOUS

## 2017-10-27 MED ORDER — FENTANYL CITRATE (PF) 100 MCG/2ML IJ SOLN
25.0000 ug | INTRAMUSCULAR | Status: DC | PRN
  Administered 2017-10-27: 50 ug via INTRAVENOUS

## 2017-10-27 MED ORDER — LIDOCAINE 2% (20 MG/ML) 5 ML SYRINGE
INTRAMUSCULAR | Status: AC
  Filled 2017-10-27: qty 5

## 2017-10-27 MED ORDER — FENTANYL CITRATE (PF) 100 MCG/2ML IJ SOLN
INTRAMUSCULAR | Status: AC
  Administered 2017-10-27: 50 ug via INTRAVENOUS
  Filled 2017-10-27: qty 2

## 2017-10-27 MED ORDER — VITAMIN B-12 100 MCG PO TABS
100.0000 ug | ORAL_TABLET | Freq: Every day | ORAL | Status: DC
  Administered 2017-10-28: 100 ug via ORAL
  Filled 2017-10-27 (×2): qty 1

## 2017-10-27 MED ORDER — LIDOCAINE HCL (CARDIAC) 20 MG/ML IV SOLN
INTRAVENOUS | Status: DC | PRN
  Administered 2017-10-27: 30 mg via INTRAVENOUS

## 2017-10-27 MED ORDER — PROMETHAZINE HCL 25 MG/ML IJ SOLN
6.2500 mg | INTRAMUSCULAR | Status: DC | PRN
Start: 2017-10-27 — End: 2017-10-27
  Administered 2017-10-27: 6.25 mg via INTRAVENOUS

## 2017-10-27 MED ORDER — MEPERIDINE HCL 50 MG/ML IJ SOLN: 6.2500 mg | INTRAMUSCULAR | Status: DC | PRN

## 2017-10-27 MED ORDER — ROCURONIUM BROMIDE 10 MG/ML (PF) SYRINGE
PREFILLED_SYRINGE | INTRAVENOUS | Status: AC
  Filled 2017-10-27: qty 5

## 2017-10-27 MED ORDER — HYDROCHLOROTHIAZIDE 12.5 MG PO CAPS
12.5000 mg | ORAL_CAPSULE | Freq: Every day | ORAL | Status: DC
  Administered 2017-10-28: 12.5 mg via ORAL
  Filled 2017-10-27 (×2): qty 1

## 2017-10-27 MED ORDER — HYDROCODONE-ACETAMINOPHEN 5-325 MG PO TABS: 1.0000 | ORAL_TABLET | ORAL | Status: DC | PRN

## 2017-10-27 MED ORDER — CEFAZOLIN SODIUM-DEXTROSE 2-3 GM-%(50ML) IV SOLR
INTRAVENOUS | Status: DC | PRN
  Administered 2017-10-27: 2 g via INTRAVENOUS

## 2017-10-27 MED ORDER — PROPOFOL 10 MG/ML IV BOLUS
INTRAVENOUS | Status: DC | PRN
  Administered 2017-10-27: 80 mg via INTRAVENOUS

## 2017-10-27 MED ORDER — LEVOTHYROXINE SODIUM 88 MCG PO TABS
88.0000 ug | ORAL_TABLET | Freq: Every day | ORAL | Status: DC
  Administered 2017-10-28: 88 ug via ORAL
  Filled 2017-10-27: qty 1

## 2017-10-27 MED ORDER — MORPHINE SULFATE (PF) 4 MG/ML IV SOLN: 2.0000 mg | INTRAVENOUS | Status: DC | PRN

## 2017-10-27 MED ORDER — MIDAZOLAM HCL 2 MG/2ML IJ SOLN: 0.5000 mg | Freq: Once | INTRAMUSCULAR | Status: DC | PRN

## 2017-10-27 MED ORDER — PROPOFOL 10 MG/ML IV BOLUS
INTRAVENOUS | Status: AC
  Filled 2017-10-27: qty 40

## 2017-10-27 MED ORDER — FENTANYL CITRATE (PF) 100 MCG/2ML IJ SOLN
INTRAMUSCULAR | Status: DC | PRN
  Administered 2017-10-27: 250 ug via INTRAVENOUS

## 2017-10-27 MED ORDER — OLMESARTAN MEDOXOMIL-HCTZ 40-12.5 MG PO TABS: 1.0000 | ORAL_TABLET | Freq: Every day | ORAL | Status: DC

## 2017-10-27 MED ORDER — PHENYLEPHRINE 40 MCG/ML (10ML) SYRINGE FOR IV PUSH (FOR BLOOD PRESSURE SUPPORT)
PREFILLED_SYRINGE | INTRAVENOUS | Status: AC
  Filled 2017-10-27: qty 10

## 2017-10-27 MED ORDER — CEFAZOLIN SODIUM-DEXTROSE 1-4 GM/50ML-% IV SOLN
1.0000 g | Freq: Three times a day (TID) | INTRAVENOUS | Status: AC
  Administered 2017-10-27 – 2017-10-28 (×3): 1 g via INTRAVENOUS
  Filled 2017-10-27 (×3): qty 50

## 2017-10-27 MED ORDER — PROMETHAZINE HCL 25 MG/ML IJ SOLN: 12.5000 mg | Freq: Four times a day (QID) | INTRAMUSCULAR | Status: DC | PRN

## 2017-10-27 MED ORDER — NEOSTIGMINE METHYLSULFATE 5 MG/5ML IV SOSY
PREFILLED_SYRINGE | INTRAVENOUS | Status: AC
  Filled 2017-10-27: qty 5

## 2017-10-27 MED ORDER — LABETALOL HCL 5 MG/ML IV SOLN
INTRAVENOUS | Status: DC | PRN
Start: 2017-10-27 — End: 2017-10-27
  Administered 2017-10-27: 10 mg via INTRAVENOUS
  Administered 2017-10-27: 20 mg via INTRAVENOUS
  Administered 2017-10-27: 10 mg via INTRAVENOUS

## 2017-10-27 MED ORDER — IRBESARTAN 300 MG PO TABS
300.0000 mg | ORAL_TABLET | Freq: Every day | ORAL | Status: DC
  Administered 2017-10-28: 300 mg via ORAL
  Filled 2017-10-27 (×2): qty 1

## 2017-10-27 MED ORDER — KCL IN DEXTROSE-NACL 20-5-0.45 MEQ/L-%-% IV SOLN
INTRAVENOUS | Status: DC
  Administered 2017-10-27 – 2017-10-28 (×2): via INTRAVENOUS
  Filled 2017-10-27 (×2): qty 1000

## 2017-10-27 MED ORDER — LABETALOL HCL 5 MG/ML IV SOLN
INTRAVENOUS | Status: AC
  Filled 2017-10-27: qty 8

## 2017-10-27 MED ORDER — LACTATED RINGERS IV SOLN
INTRAVENOUS | Status: DC
  Administered 2017-10-27 (×3): via INTRAVENOUS

## 2017-10-27 MED ORDER — 0.9 % SODIUM CHLORIDE (POUR BTL) OPTIME
TOPICAL | Status: DC | PRN
  Administered 2017-10-27: 2000 mL

## 2017-10-27 MED ORDER — SODIUM CHLORIDE 0.9 % IV SOLN: Freq: Once | INTRAVENOUS | Status: DC

## 2017-10-27 MED ORDER — ONDANSETRON HCL 4 MG/2ML IJ SOLN
INTRAMUSCULAR | Status: DC | PRN
  Administered 2017-10-27: 4 mg via INTRAVENOUS

## 2017-10-27 MED ORDER — GLYCOPYRROLATE 0.2 MG/ML IJ SOLN
INTRAMUSCULAR | Status: DC | PRN
  Administered 2017-10-27: .4 mg via INTRAVENOUS

## 2017-10-27 MED ORDER — ONDANSETRON HCL 4 MG/2ML IJ SOLN
INTRAMUSCULAR | Status: AC
  Filled 2017-10-27: qty 2

## 2017-10-27 MED ORDER — ONDANSETRON HCL 4 MG/2ML IJ SOLN
4.0000 mg | Freq: Four times a day (QID) | INTRAMUSCULAR | Status: DC
  Administered 2017-10-27 – 2017-10-28 (×4): 4 mg via INTRAVENOUS
  Filled 2017-10-27 (×4): qty 2

## 2017-10-27 MED ORDER — FENTANYL CITRATE (PF) 250 MCG/5ML IJ SOLN
INTRAMUSCULAR | Status: AC
Start: 2017-10-27 — End: ?
  Filled 2017-10-27: qty 5

## 2017-10-27 MED ORDER — MIDAZOLAM HCL 2 MG/2ML IJ SOLN
INTRAMUSCULAR | Status: AC
  Filled 2017-10-27: qty 2

## 2017-10-27 SURGICAL SUPPLY — 55 items
ADH SKN CLS APL DERMABOND .7 (GAUZE/BANDAGES/DRESSINGS) ×1
ATTRACTOMAT 16X20 MAGNETIC DRP (DRAPES) ×2 IMPLANT
CANISTER SUCT 3000ML PPV (MISCELLANEOUS) ×3 IMPLANT
CANNULA VESSEL 3MM 2 BLNT TIP (CANNULA) ×5 IMPLANT
CATH ROBINSON RED A/P 18FR (CATHETERS) ×3 IMPLANT
CLEANER TIP ELECTROSURG 2X2 (MISCELLANEOUS) ×3 IMPLANT
CLIP VESOCCLUDE MED 24/CT (CLIP) ×3 IMPLANT
CLIP VESOCCLUDE SM WIDE 24/CT (CLIP) ×3 IMPLANT
CONT SPEC 4OZ CLIKSEAL STRL BL (MISCELLANEOUS) ×3 IMPLANT
COVER SURGICAL LIGHT HANDLE (MISCELLANEOUS) ×3 IMPLANT
CRADLE DONUT ADULT HEAD (MISCELLANEOUS) ×3 IMPLANT
DECANTER SPIKE VIAL GLASS SM (MISCELLANEOUS) ×2 IMPLANT
DERMABOND ADVANCED (GAUZE/BANDAGES/DRESSINGS) ×2
DERMABOND ADVANCED .7 DNX12 (GAUZE/BANDAGES/DRESSINGS) ×1 IMPLANT
DRAIN HEMOVAC 7FR (DRAIN) ×2 IMPLANT
DRAPE INCISE IOBAN 66X45 STRL (DRAPES) ×2 IMPLANT
DRAPE LAPAROSCOPIC ABDOMINAL (DRAPES) ×2 IMPLANT
ELECT COATED BLADE 2.86 ST (ELECTRODE) ×5 IMPLANT
ELECT REM PT RETURN 9FT ADLT (ELECTROSURGICAL) ×6
ELECTRODE REM PT RTRN 9FT ADLT (ELECTROSURGICAL) ×2 IMPLANT
EVACUATOR SILICONE 100CC (DRAIN) ×2 IMPLANT
FORCEPS BIPOLAR SPETZLER 8 1.0 (NEUROSURGERY SUPPLIES) ×2 IMPLANT
GEL ULTRASOUND 20GR AQUASONIC (MISCELLANEOUS) ×3 IMPLANT
GLOVE BIO SURGEON STRL SZ7.5 (GLOVE) ×6 IMPLANT
GLOVE BIOGEL PI IND STRL 8 (GLOVE) ×1 IMPLANT
GLOVE BIOGEL PI INDICATOR 8 (GLOVE) ×2
GLOVE SURG SS PI 6.5 STRL IVOR (GLOVE) ×2 IMPLANT
GOWN STRL REUS W/ TWL LRG LVL3 (GOWN DISPOSABLE) ×5 IMPLANT
GOWN STRL REUS W/TWL LRG LVL3 (GOWN DISPOSABLE) ×9
KIT BASIN OR (CUSTOM PROCEDURE TRAY) ×6 IMPLANT
KIT ROOM TURNOVER OR (KITS) ×6 IMPLANT
MARKER SKIN DUAL TIP RULER LAB (MISCELLANEOUS) ×5 IMPLANT
NDL HYPO 25GX1X1/2 BEV (NEEDLE) ×1 IMPLANT
NEEDLE HYPO 25GX1X1/2 BEV (NEEDLE) ×6 IMPLANT
NS IRRIG 1000ML POUR BTL (IV SOLUTION) ×9 IMPLANT
PACK CAROTID (CUSTOM PROCEDURE TRAY) ×3 IMPLANT
PAD ARMBOARD 7.5X6 YLW CONV (MISCELLANEOUS) ×12 IMPLANT
PENCIL BUTTON HOLSTER BLD 10FT (ELECTRODE) ×3 IMPLANT
SUT CHROMIC 4 0 P 3 18 (SUTURE) ×3 IMPLANT
SUT ETHILON 2 0 FS 18 (SUTURE) ×2 IMPLANT
SUT ETHILON 4 0 PS 2 18 (SUTURE) ×3 IMPLANT
SUT ETHILON 5 0 P 3 18 (SUTURE) ×2
SUT NYLON ETHILON 5-0 P-3 1X18 (SUTURE) ×1 IMPLANT
SUT PROLENE 6 0 BV (SUTURE) ×3 IMPLANT
SUT SILK 4 0 (SUTURE) ×3
SUT SILK 4-0 18XBRD TIE 12 (SUTURE) ×1 IMPLANT
SUT VIC AB 3-0 SH 27 (SUTURE) ×3
SUT VIC AB 3-0 SH 27X BRD (SUTURE) ×1 IMPLANT
SUT VICRYL 4-0 PS2 18IN ABS (SUTURE) ×3 IMPLANT
SYR 20CC LL (SYRINGE) ×3 IMPLANT
SYR CONTROL 10ML LL (SYRINGE) ×5 IMPLANT
TOWEL GREEN STERILE (TOWEL DISPOSABLE) ×3 IMPLANT
TOWEL OR 17X24 6PK STRL BLUE (TOWEL DISPOSABLE) ×3 IMPLANT
TRAY ENT MC OR (CUSTOM PROCEDURE TRAY) ×3 IMPLANT
WATER STERILE IRR 1000ML POUR (IV SOLUTION) ×6 IMPLANT

## 2017-10-27 NOTE — Brief Op Note (Signed)
10/27/2017  12:43 PM  PATIENT:  Blake Divine  77 y.o. female  PRE-OPERATIVE DIAGNOSIS:  Left carotid body tumor  POST-OPERATIVE DIAGNOSIS:  Left carotid body tumor  PROCEDURE:  Procedure(s): TUMOR EXCISION CAROTID BODY (Left) EXCISION CAROTID BODY TUMOR (Left)  SURGEON:  Surgeon(s) and Role: Panel 1:    Angelia Mould, MD - Primary Panel 2:    * Melida Quitter, MD - Primary  PHYSICIAN ASSISTANT:   ASSISTANTS: none   ANESTHESIA:   general  EBL: 20 cc  BLOOD ADMINISTERED:none  DRAINS: (7 Fr) Jackson-Pratt drain(s) with closed bulb suction in the left neck   LOCAL MEDICATIONS USED:  LIDOCAINE   SPECIMEN:  Source of Specimen:  left carotid body tumor  DISPOSITION OF SPECIMEN:  PATHOLOGY  COUNTS:  YES  TOURNIQUET:  * No tourniquets in log *  DICTATION: .Other Dictation: Dictation Number [id number]  PLAN OF CARE: To regular room  PATIENT DISPOSITION:  PACU - hemodynamically stable.   Delay start of Pharmacological VTE agent (>24hrs) due to surgical blood loss or risk of bleeding: yes

## 2017-10-27 NOTE — Progress Notes (Signed)
Referring Physician(s): Dr Rosalia Hammers  Supervising Physician: Luanne Bras  Patient Status:  [facility] - In-pt  Chief Complaint:  2/7: S/P bilateral common carotid and Rt vertebral artery , and Lt subclavian arteriograms followed by embolization of Lt carotid tumor with PVA particles 350 to 500 microns and coil with 80 to 90 % devascularization of tumor. . Occluded LT VA at origin    Subjective:  Doing great up in bed Awaiting 10 am surgery time No complaints Denies N/V; denies pain Denies numbness; tingling Denies vision or speech changes   Allergies: Codeine  Medications: Prior to Admission medications   Medication Sig Start Date End Date Taking? Authorizing Provider  aspirin EC 81 MG tablet Take 81 mg by mouth daily.   Yes [provider]  cholecalciferol (VITAMIN D) 1000 UNITS tablet Take 1,000 Units by mouth daily.   Yes [provider]  ferrous fumarate (HEMOCYTE - 106 MG FE) 325 (106 FE) MG TABS Take 1 tablet (106 mg of iron total) by mouth daily. 02/05/13  Yes Sinda Du, MD  levothyroxine (SYNTHROID, LEVOTHROID) 88 MCG tablet Take 88 mcg by mouth daily before breakfast.    Yes [provider]  linaclotide (LINZESS) 145 MCG CAPS capsule Take 145 mcg by mouth daily as needed (constipation).   Yes [provider]  naproxen sodium (ALEVE) 220 MG tablet Take 220 mg by mouth daily as needed (headaches).   Yes [provider]  olmesartan-hydrochlorothiazide (BENICAR HCT) 40-12.5 MG per tablet Take 1 tablet by mouth daily.   Yes [provider]  vitamin B-12 (CYANOCOBALAMIN) 100 MCG tablet Take 100 mcg by mouth daily.   Yes [provider]     Vital Signs: BP (!) 117/52 (BP Location: Right Arm)   Pulse 68   Temp 98.7 F (37.1 C) (Oral)   Resp (!) 24   Ht 5\' 4"  (1.626 m)   Wt 155 lb 13.8 oz (70.7 kg)   SpO2 100%   BMI 26.75 kg/m   Physical Exam  Constitutional: She is oriented to person,  place, and time.  Neurological: She is alert and oriented to person, place, and time.  Skin: Skin is warm and dry.  Right groin NT no bleeding No hematoma Rt foot 2+ pulses Moves all 4s-- good strength B  Psychiatric: She has a normal mood and affect. Her behavior is normal.  Nursing note and vitals reviewed.   Imaging: No results found.  Labs:  CBC: Recent Labs    10/26/17 0707 10/27/17 0500  WBC 5.0 13.1*  HGB 11.7* 10.3*  HCT 36.5 32.2*  PLT 185 181    COAGS: Recent Labs    10/26/17 0707 10/27/17 0500  INR 1.11  --   APTT 40* 40*    BMP: Recent Labs    08/15/17 1441 10/26/17 0707 10/27/17 0500  NA  --  140 142  K  --  3.3* 3.7  CL  --  104 112*  CO2  --  24 20*  GLUCOSE  --  111* 147*  BUN  --  15 14  CALCIUM  --  10.0 8.8*  CREATININE 1.20* 1.21* 1.38*  GFRNONAA  --  42* 36*  GFRAA  --  49* 42*    LIVER FUNCTION TESTS: Recent Labs    10/26/17 0707  BILITOT 0.6  AST 22  ALT 13*  ALKPHOS 63  PROT 6.7  ALBUMIN 3.8    Assessment and Plan:  Left carotid tumor embolization 2/7 in  IR with Dr Estanislado Pandy For OR today with Dr Scot Dock  Electronically Signed: Monia Sabal A, PA-C 10/27/2017, 7:54 AM   I spent a total of 15 Minutes at the the patient's bedside AND on the patient's hospital floor or unit, greater than 50% of which was counseling/coordinating care for left carotid tumor

## 2017-10-27 NOTE — Progress Notes (Signed)
   VASCULAR SURGERY POSTOP:   Stable postop.  She is complaining of some nausea and I have written for Zofran.  Neuro intact.  Anticipate discharge tomorrow.  SUBJECTIVE:   Complains of nausea.  PHYSICAL EXAM:   Vitals:   10/27/17 1450 10/27/17 1505 10/27/17 1518 10/27/17 1531  BP: 135/72 (!) 143/74 (!) 146/72 (!) 152/64  Pulse: (!) 51 (!) 48 (!) 54 (!) 50  Resp: 19 (!) 23 13 18   Temp:   97.7 F (36.5 C) 97.9 F (36.6 C)  TempSrc:    Oral  SpO2: 94% 96% 95% 91%  Weight:      Height:       NEURO: No focal weakness or paresthesias. Left neck incision looks fine. Drain has minimal drainage.  He   LABS:   Lab Results  Component Value Date   WBC 13.1 (H) 10/27/2017   HGB 10.3 (L) 10/27/2017   HCT 32.2 (L) 10/27/2017   MCV 88.2 10/27/2017   PLT 181 10/27/2017   Lab Results  Component Value Date   CREATININE 1.38 (H) 10/27/2017   Lab Results  Component Value Date   INR 1.11 10/26/2017   CBG (last 3)  No results for input(s): GLUCAP in the last 72 hours.  PROBLEM LIST:    Active Problems:   Carotid body tumor (H[facility])   CURRENT MEDS:   . irbesartan  300 mg Oral Daily   And  . hydrochlorothiazide  12.5 mg Oral Daily  . [START ON 10/28/2017] levothyroxine  88 mcg Oral QAC breakfast  . ondansetron (ZOFRAN) IV  4 mg Intravenous Q6H  . promethazine      . vitamin B-12  100 mcg Oral Daily    [name] Beeper: [phone] Office: [phone] 10/27/2017

## 2017-10-27 NOTE — Anesthesia Procedure Notes (Signed)
Procedure Name: Intubation Date/Time: 10/27/2017 11:10 AM Performed by: Eligha Bridegroom, CRNA Pre-anesthesia Checklist: Patient identified, Emergency Drugs available, Suction available, Patient being monitored and Timeout performed Patient Re-evaluated:Patient Re-evaluated prior to induction Oxygen Delivery Method: Circle system utilized Preoxygenation: Pre-oxygenation with 100% oxygen Induction Type: IV induction Ventilation: Mask ventilation without difficulty and Oral airway inserted - appropriate to patient size Laryngoscope Size: Mac and 3 Grade View: Grade I Tube size: 7.0 mm Number of attempts: 1 Airway Equipment and Method: Stylet Placement Confirmation: ETT inserted through vocal cords under direct vision,  positive ETCO2 and breath sounds checked- equal and bilateral Secured at: 21 cm Tube secured with: Tape Dental Injury: Teeth and Oropharynx as per pre-operative assessment

## 2017-10-27 NOTE — Anesthesia Postprocedure Evaluation (Signed)
Anesthesia Post Note  Patient: Heather Cobb  Procedure(s) Performed: EXCISION CAROTID BODY TUMOR (Left )     Patient location during evaluation: PACU Anesthesia Type: General Level of consciousness: awake and alert, patient cooperative and oriented Pain management: pain level controlled Vital Signs Assessment: post-procedure vital signs reviewed and stable Respiratory status: spontaneous breathing, nonlabored ventilation, respiratory function stable and patient connected to nasal cannula oxygen Cardiovascular status: blood pressure returned to baseline and stable Postop Assessment: no apparent nausea or vomiting Anesthetic complications: no    Last Vitals:  Vitals:   10/27/17 1518 10/27/17 1531  BP: (!) 146/72 (!) 152/64  Pulse: (!) 54 (!) 50  Resp: 13 18  Temp: 36.5 C 36.6 C  SpO2: 95% 91%    Last Pain:  Vitals:   10/27/17 1531  TempSrc: Oral  PainSc: 0-No pain                 Delmont Prosch,E. Lilleigh Hechavarria

## 2017-10-27 NOTE — Interval H&P Note (Signed)
History and Physical Interval Note:  10/27/2017 9:31 AM  Heather Cobb  has presented today for surgery, with the diagnosis of left carotid body tumor  The various methods of treatment have been discussed with the patient and family. After consideration of risks, benefits and other options for treatment, the patient has consented to  Procedure(s): TUMOR EXCISION CAROTID BODY (Left) EXCISION CAROTID BODY TUMOR (Left) as a surgical intervention .  The patient's history has been reviewed, patient examined, no change in status, stable for surgery.  I have reviewed the patient's chart and labs.  Questions were answered to the patient's satisfaction.     [name]

## 2017-10-27 NOTE — Anesthesia Preprocedure Evaluation (Addendum)
Anesthesia Evaluation  Patient identified by MRN, date of birth, ID band Patient awake    Reviewed: Allergy & Precautions, NPO status , Patient's Chart, lab work & pertinent test results  History of Anesthesia Complications Negative for: history of anesthetic complications  Airway Mallampati: II  TM Distance: >3 FB Neck ROM: Full    Dental  (+) Dental Advisory Given, Edentulous Upper, Missing   Pulmonary neg pulmonary ROS,    breath sounds clear to auscultation       Cardiovascular hypertension, Pt. on medications (-) angina Rhythm:Regular Rate:Normal     Neuro/Psych vertigo    GI/Hepatic negative GI ROS, Neg liver ROS,   Endo/Other  Hypothyroidism   Renal/GU Renal InsufficiencyRenal disease (creat 1.38)     Musculoskeletal   Abdominal   Peds  Hematology  (+) Blood dyscrasia (Hb 10.3), anemia ,   Anesthesia Other Findings Carotid body tumor: paraganglioma   Urine catechols: normal   Urine metanephrine: normal   Urine normetanephrine: upper limit of normal  Reproductive/Obstetrics                            Anesthesia Physical Anesthesia Plan  ASA: III  Anesthesia Plan: General   Post-op Pain Management:    Induction: Intravenous  PONV Risk Score and Plan: 3 and Ondansetron and Dexamethasone  Airway Management Planned: Oral ETT  Additional Equipment: Arterial line  Intra-op Plan:   Post-operative Plan: Extubation in OR  Informed Consent: I have reviewed the patients History and Physical, chart, labs and discussed the procedure including the risks, benefits and alternatives for the proposed anesthesia with the patient or authorized representative who has indicated his/her understanding and acceptance.   Dental advisory given  Plan Discussed with: CRNA and Surgeon  Anesthesia Plan Comments: (Plan routine monitors, A line, GETA)        Anesthesia Quick Evaluation

## 2017-10-27 NOTE — Transfer of Care (Signed)
Immediate Anesthesia Transfer of Care Note  Patient: Heather Cobb  Procedure(s) Performed: EXCISION CAROTID BODY TUMOR (Left )  Patient Location: PACU  Anesthesia Type:General  Level of Consciousness: awake, alert  and oriented  Airway & Oxygen Therapy: Patient Spontanous Breathing and Patient connected to nasal cannula oxygen  Post-op Assessment: Report given to RN and Post -op Vital signs reviewed and stable  Post vital signs: Reviewed and stable  Last Vitals:  Vitals:   10/27/17 0900 10/27/17 1303  BP: 117/60 140/76  Pulse: 72 68  Resp: 20 20  Temp:  (!) 36.4 C  SpO2: 99% 97%    Last Pain:  Vitals:   10/27/17 0310  TempSrc: Oral      Patients Stated Pain Goal: 0 (94/44/61 9012)  Complications: No apparent anesthesia complications

## 2017-10-27 NOTE — Progress Notes (Signed)
   ENT Progress Note: s/p Procedure(s): EXCISION CAROTID BODY TUMOR   Subjective: C/O nausea  Objective: Vital signs in last 24 hours: Temp:  [97.5 F (36.4 C)-98.7 F (37.1 C)] 97.9 F (36.6 C) (02/08 1531) Pulse Rate:  [40-81] 50 (02/08 1531) Resp:  [12-36] 18 (02/08 1531) BP: (88-154)/(40-104) 152/64 (02/08 1531) SpO2:  [91 %-100 %] 91 % (02/08 1531) Arterial Line BP: (95-163)/(36-59) 158/59 (02/08 0900) Weight:  [70.3 kg (155 lb)] 70.3 kg (155 lb) (02/08 0920) Weight change: -0.515 kg (-2.2 oz) Last BM Date: (pta)  Intake/Output from previous day: 02/07 0701 - 02/08 0700 In: 2389.7 [I.V.:2389.7] Out: 620 [Urine:570; Blood:50] Intake/Output this shift: Total I/O In: 1750 [I.V.:1600; Other:150] Out: 32 [Drains:12; Blood:20]  Labs: Recent Labs    10/26/17 0707 10/27/17 0500  WBC 5.0 13.1*  HGB 11.7* 10.3*  HCT 36.5 32.2*  PLT 185 181   Recent Labs    10/26/17 0707 10/27/17 0500  NA 140 142  K 3.3* 3.7  CL 104 112*  CO2 24 20*  GLUCOSE 111* 147*  BUN 15 14  CALCIUM 10.0 8.8*    Studies/Results: No results found.   PHYSICAL EXAM: Inc intact Min JP outpt Nl voice and airway    Assessment/Plan: Stable O/N obs Monitor jp out put and nausea    Heather Cobb 10/27/2017, 5:53 PM

## 2017-10-27 NOTE — Op Note (Signed)
    NAME: Heather Cobb    MRN: [id number] DOB: 05/12/1941    DATE OF OPERATION: 10/27/2017  PREOP DIAGNOSIS:    Left Carotid Body Tumor  POSTOP DIAGNOSIS:    Same  PROCEDURE:    Resection of Left Carotid Body Tumor  CO-SURGEON's: Melida Quitter, MD, Gae Gallop, MD  ANESTHESIA: Gen  EBL: min  INDICATIONS:    [name] is a 77 y.o. female who had noted a right neck mass.  This prompted a CT scan which showed an incidental finding of a carotid body tumor on the left.  The patient underwent preoperative embolization by Dr. Estanislado Pandy yesterday.  She presents for excision of the tumor.  Given the the potential need for carotid reconstruction given the location of the tumor this was done together with Dr. Redmond Baseman who was the primary surgeon.   FINDINGS:   No significant involvement of the external or common carotid artery.  Therefore no vascular reconstruction was required.  TECHNIQUE:   With the primary dictation is as per Dr. Melida Quitter.  My role was to be available  in case there was a need for significant vascular reconstruction.  After excision of the tumor the internal and external carotid arteries were intact with no need for further vascular reconstruction.  [name], MD, FACS [facility]  DATE OF DICTATION:   10/27/2017

## 2017-10-28 ENCOUNTER — Encounter (HOSPITAL_COMMUNITY): Payer: Self-pay | Admitting: Otolaryngology

## 2017-10-28 MED ORDER — PROMETHAZINE HCL 12.5 MG PO TABS: 12.5000 mg | ORAL_TABLET | Freq: Four times a day (QID) | ORAL | 0 refills | Status: DC | PRN

## 2017-10-28 MED ORDER — HYDROCODONE-ACETAMINOPHEN 5-325 MG PO TABS: 1.0000 | ORAL_TABLET | Freq: Four times a day (QID) | ORAL | 0 refills | Status: DC | PRN

## 2017-10-28 NOTE — Progress Notes (Signed)
Pt for discharge going home , discontinued peripheral IV line, discontinued by MD the JP drain left neck dressing dry and intact, health teachings, next appt, personal belongings given, no s/s of distress noted, still waiting for case manager for health instructions.

## 2017-10-28 NOTE — Discharge Summary (Signed)
Physician Discharge Summary  Patient ID: Heather Cobb MRN: [id number] DOB/AGE: May 26, 1941 77 y.o.  Admit date: 10/26/2017 Discharge date: 10/28/2017  Admission Diagnoses:  Active Problems:   Carotid body tumor [facility])   Discharge Diagnoses:  Same  Surgeries: Procedure(s): EXCISION CAROTID BODY TUMOR on 10/27/2017   Consultants: None  Discharged Condition: Improved  Hospital Course: Heather Cobb is an 77 y.o. female who was admitted 10/26/2017 with a diagnosis of Active Problems:   Carotid body tumor ([place])  and went to the operating room on 10/27/2017 and underwent the above named procedures.   Patient stable and doing well on postoperative day 1.  JP drain removed without difficulty, incision intact and stable.  Discharged to home with follow-up in approximately 10 days.  Recent vital signs:  Vitals:   10/28/17 0150 10/28/17 0600  BP: 135/60 138/60  Pulse: 65 80  Resp: 17 18  Temp: 98.5 F (36.9 C) 98.4 F (36.9 C)  SpO2: 98% 97%    Recent laboratory studies:  Results for orders placed or performed during the hospital encounter of 10/26/17  MRSA PCR Screening  Result Value Ref Range   MRSA by PCR NEGATIVE NEGATIVE  Surgical PCR screen  Result Value Ref Range   MRSA, PCR NEGATIVE NEGATIVE   Staphylococcus aureus NEGATIVE NEGATIVE  APTT  Result Value Ref Range   aPTT 40 (H) 24 - 36 seconds  CBC WITH DIFFERENTIAL  Result Value Ref Range   WBC 5.0 4.0 - 10.5 K/uL   RBC 4.14 3.87 - 5.11 MIL/uL   Hemoglobin 11.7 (L) 12.0 - 15.0 g/dL   HCT 36.5 36.0 - 46.0 %   MCV 88.2 78.0 - 100.0 fL   MCH 28.3 26.0 - 34.0 pg   MCHC 32.1 30.0 - 36.0 g/dL   RDW 13.9 11.5 - 15.5 %   Platelets 185 150 - 400 K/uL   Neutrophils Relative % 42 %   Neutro Abs 2.1 1.7 - 7.7 K/uL   Lymphocytes Relative 45 %   Lymphs Abs 2.2 0.7 - 4.0 K/uL   Monocytes Relative 9 %   Monocytes Absolute 0.4 0.1 - 1.0 K/uL   Eosinophils Relative 4 %   Eosinophils Absolute 0.2 0.0 - 0.7 K/uL   Basophils  Relative 0 %   Basophils Absolute 0.0 0.0 - 0.1 K/uL  Protime-INR  Result Value Ref Range   Prothrombin Time 14.3 11.4 - 15.2 seconds   INR 1.11   Comprehensive metabolic panel  Result Value Ref Range   Sodium 140 135 - 145 mmol/L   Potassium 3.3 (L) 3.5 - 5.1 mmol/L   Chloride 104 101 - 111 mmol/L   CO2 24 22 - 32 mmol/L   Glucose, Bld 111 (H) 65 - 99 mg/dL   BUN 15 6 - 20 mg/dL   Creatinine, Ser 1.21 (H) 0.44 - 1.00 mg/dL   Calcium 10.0 8.9 - 10.3 mg/dL   Total Protein 6.7 6.5 - 8.1 g/dL   Albumin 3.8 3.5 - 5.0 g/dL   AST 22 15 - 41 U/L   ALT 13 (L) 14 - 54 U/L   Alkaline Phosphatase 63 38 - 126 U/L   Total Bilirubin 0.6 0.3 - 1.2 mg/dL   GFR calc non Af Amer 42 (L) >60 mL/min   GFR calc Af Amer 49 (L) >60 mL/min   Anion gap 12 5 - 15  Blood gas, arterial  Result Value Ref Range   FIO2 ROOM AIR    pH, Arterial 7.471 (H)  7.350 - 7.450   pCO2 arterial 40.8 32.0 - 48.0 mmHg   pO2, Arterial 113 (H) 83.0 - 108.0 mmHg   Bicarbonate 29.4 (H) 20.0 - 28.0 mmol/L   Acid-Base Excess 5.7 (H) 0.0 - 2.0 mmol/L   O2 Saturation 98.4 %   Patient temperature 98.6    Drawn by ARTERIAL DRAW    Sample type ARTERIAL DRAW    Allens test (pass/fail) PASS PASS  Urinalysis, Routine w reflex microscopic  Result Value Ref Range   Color, Urine YELLOW YELLOW   APPearance HAZY (A) CLEAR   Specific Gravity, Urine 1.018 1.005 - 1.030   pH 5.0 5.0 - 8.0   Glucose, UA NEGATIVE NEGATIVE mg/dL   Hgb urine dipstick NEGATIVE NEGATIVE   Bilirubin Urine NEGATIVE NEGATIVE   Ketones, ur NEGATIVE NEGATIVE mg/dL   Protein, ur NEGATIVE NEGATIVE mg/dL   Nitrite POSITIVE (A) NEGATIVE   Leukocytes, UA MODERATE (A) NEGATIVE   RBC / HPF 6-30 0 - 5 RBC/hpf   WBC, UA TOO NUMEROUS TO COUNT 0 - 5 WBC/hpf   Bacteria, UA MANY (A) NONE SEEN   Squamous Epithelial / LPF 0-5 (A) NONE SEEN   Mucus PRESENT    Hyaline Casts, UA PRESENT   Basic metabolic panel  Result Value Ref Range   Sodium 142 135 - 145 mmol/L    Potassium 3.7 3.5 - 5.1 mmol/L   Chloride 112 (H) 101 - 111 mmol/L   CO2 20 (L) 22 - 32 mmol/L   Glucose, Bld 147 (H) 65 - 99 mg/dL   BUN 14 6 - 20 mg/dL   Creatinine, Ser 1.38 (H) 0.44 - 1.00 mg/dL   Calcium 8.8 (L) 8.9 - 10.3 mg/dL   GFR calc non Af Amer 36 (L) >60 mL/min   GFR calc Af Amer 42 (L) >60 mL/min   Anion gap 10 5 - 15  CBC with Differential/Platelet  Result Value Ref Range   WBC 13.1 (H) 4.0 - 10.5 K/uL   RBC 3.65 (L) 3.87 - 5.11 MIL/uL   Hemoglobin 10.3 (L) 12.0 - 15.0 g/dL   HCT 32.2 (L) 36.0 - 46.0 %   MCV 88.2 78.0 - 100.0 fL   MCH 28.2 26.0 - 34.0 pg   MCHC 32.0 30.0 - 36.0 g/dL   RDW 13.9 11.5 - 15.5 %   Platelets 181 150 - 400 K/uL   Neutrophils Relative % 90 %   Neutro Abs 11.9 (H) 1.7 - 7.7 K/uL   Lymphocytes Relative 6 %   Lymphs Abs 0.7 0.7 - 4.0 K/uL   Monocytes Relative 4 %   Monocytes Absolute 0.5 0.1 - 1.0 K/uL   Eosinophils Relative 0 %   Eosinophils Absolute 0.0 0.0 - 0.7 K/uL   Basophils Relative 0 %   Basophils Absolute 0.0 0.0 - 0.1 K/uL  APTT  Result Value Ref Range   aPTT 40 (H) 24 - 36 seconds  Type and screen  Result Value Ref Range   ABO/RH(D) A NEG    Antibody Screen POS    Sample Expiration 10/29/2017    Antibody Identification ANTI D    Unit Number [id number]    Blood Component Type RBC LR PHER2    Unit division 00    Status of Unit ALLOCATED    Transfusion Status OK TO TRANSFUSE    Crossmatch Result COMPATIBLE    Unit Number [id number]    Blood Component Type RED CELLS,LR    Unit division 00    Status of  Unit ALLOCATED    Transfusion Status OK TO TRANSFUSE    Crossmatch Result COMPATIBLE   Prepare RBC  Result Value Ref Range   Order Confirmation      ORDER PROCESSED BY BLOOD BANK Performed at [facility], [place] [address] [place], [place] [address]   BPAM [facility]  Result Value Ref Range   ISSUE DATE / TIME [id number]    Blood Product Unit Number [id number]    PRODUCT CODE Y1856D14    Unit Type and  Rh 0600    Blood Product Expiration Date [id number]    ISSUE DATE / TIME [id number]    Blood Product Unit Number [id number]    PRODUCT CODE H2094B09    Unit Type and Rh 0600    Blood Product Expiration Date [id number]     Discharge Medications:   Allergies as of 10/28/2017      Reactions   Codeine Nausea Only, Other (See Comments)   fever      Medication List    STOP taking these medications   aspirin EC 81 MG tablet     TAKE these medications   cholecalciferol 1000 units tablet Commonly known as:  VITAMIN D Take 1,000 Units by mouth daily.   ferrous fumarate 325 (106 Fe) MG Tabs tablet Commonly known as:  HEMOCYTE - 106 mg FE Take 1 tablet (106 mg of iron total) by mouth daily.   HYDROcodone-acetaminophen 5-325 MG tablet Commonly known as:  NORCO/VICODIN Take 1-2 tablets by mouth every 6 (six) hours as needed for moderate pain.   levothyroxine 88 MCG tablet Commonly known as:  SYNTHROID, LEVOTHROID Take 88 mcg by mouth daily before breakfast.   linaclotide 145 MCG Caps capsule Commonly known as:  LINZESS Take 145 mcg by mouth daily as needed (constipation).   naproxen sodium 220 MG tablet Commonly known as:  ALEVE Take 220 mg by mouth daily as needed (headaches).   olmesartan-hydrochlorothiazide 40-12.5 MG tablet Commonly known as:  BENICAR HCT Take 1 tablet by mouth daily.   promethazine 12.5 MG tablet Commonly known as:  PHENERGAN Take 1 tablet (12.5 mg total) by mouth every 6 (six) hours as needed for nausea or vomiting.   vitamin B-12 100 MCG tablet Commonly known as:  CYANOCOBALAMIN Take 100 mcg by mouth daily.       Diagnostic Studies: No results found.  Disposition: 01-Home or Self Care  Discharge Instructions    Diet - low sodium heart healthy   Complete by:  As directed    Discharge instructions   Complete by:  As directed    1. Limited activity 2. Liquid and soft diet, advance as tolerated 3. May bathe and shower day after  surgery 4. Wound care - Gentle cleaning with soap and water 5. DO NOT APPLY ANY OINTMENT 6. Elevate Head of Bed   Increase activity slowly   Complete by:  As directed       Follow-up Information    Melida Quitter, MD. Schedule an appointment as soon as possible for a visit in 1 week(s).   Specialty:  Otolaryngology Contact information: [address] [place] [address] [phone]            Signed: Jerrell Belfast 10/28/2017, 8:55 AM

## 2017-10-28 NOTE — Progress Notes (Signed)
  Progress Note    10/28/2017 9:33 AM 1 Day Post-Op  Subjective:  Tolerating diet  Vitals:   10/28/17 0150 10/28/17 0600  BP: 135/60 138/60  Pulse: 65 80  Resp: 17 18  Temp: 98.5 F (36.9 C) 98.4 F (36.9 C)  SpO2: 98% 97%    Physical Exam: aaox3 Cn in tact Incisional bandage in place, drain has been removed  CBC    Component Value Date/Time   WBC 13.1 (H) 10/27/2017 0500   RBC 3.65 (L) 10/27/2017 0500   HGB 10.3 (L) 10/27/2017 0500   HCT 32.2 (L) 10/27/2017 0500   PLT 181 10/27/2017 0500   MCV 88.2 10/27/2017 0500   MCH 28.2 10/27/2017 0500   MCHC 32.0 10/27/2017 0500   RDW 13.9 10/27/2017 0500   LYMPHSABS 0.7 10/27/2017 0500   MONOABS 0.5 10/27/2017 0500   EOSABS 0.0 10/27/2017 0500   BASOSABS 0.0 10/27/2017 0500    BMET    Component Value Date/Time   NA 142 10/27/2017 0500   K 3.7 10/27/2017 0500   CL 112 (H) 10/27/2017 0500   CO2 20 (L) 10/27/2017 0500   GLUCOSE 147 (H) 10/27/2017 0500   BUN 14 10/27/2017 0500   CREATININE 1.38 (H) 10/27/2017 0500   CALCIUM 8.8 (L) 10/27/2017 0500   GFRNONAA 36 (L) 10/27/2017 0500   GFRAA 42 (L) 10/27/2017 0500    INR    Component Value Date/Time   INR 1.11 10/26/2017 0707     Intake/Output Summary (Last 24 hours) at 10/28/2017 0933 Last data filed at 10/28/2017 0630 Gross per 24 hour  Intake 3710 ml  Output 452 ml  Net 3258 ml     Assessment:  77 y.o. female is pod#1 excision of carotid body tumor  Plan: Discharge per ENT Can f/u vascular as needed.  Amamda Curbow C. Donzetta Matters, MD [facility] [place] Office: [phone] Pager: [phone]  10/28/2017 9:33 AM

## 2017-10-28 NOTE — Op Note (Signed)
Heather Cobb, Heather Cobb NO.:  [id number]  MEDICAL RECORD NO.:  [id number]  LOCATION:  6N15C                        FACILITY:  [facility]  PHYSICIAN:  Onnie Graham, MD     DATE OF BIRTH:  November 21, 1940  DATE OF PROCEDURE:  10/27/2017 DATE OF DISCHARGE:                              OPERATIVE REPORT   PREOPERATIVE DIAGNOSIS:  Left carotid body tumor.  POSTOPERATIVE DIAGNOSIS:  Left carotid body tumor.  PROCEDURE:  Excision of left carotid body tumor.  SURGEON:  Melida Quitter.  ASSISTANT:  Dr. Shanon Rosser.  ANESTHESIA:  General endotracheal anesthesia.  COMPLICATIONS:  None.  INDICATION:  The patient is a 77 year old female who recently underwent a neck CT imaging because of a concern of a right neck mass that incidentally demonstrated a left carotid body tumor.  In counseling the patient, she has elected to proceed with excision and presented to the hospital yesterday for embolization which was performed by Dr. Estanislado Pandy.  She today presents to the operating room for surgical management.  FINDINGS:  There was an almond-shaped mass identified and removed from the crotch of the left carotid bifurcation.  This was sent for pathology.  DESCRIPTION OF PROCEDURE:  The patient was identified in the holding room.  Informed consent having been obtained including discussion of risks, benefits, alternatives, the patient was brought to the operative suite and put on the operating table in a supine position.  Anesthesia was induced and the patient was intubated by the Anesthesia team without difficulty.  A shoulder roll was placed and the left neck incision was marked with a marking pen and injected with 1% lidocaine with 1:100,000 epinephrine.  The left neck was prepped and draped in sterile fashion. The right groin and upper thigh were also prepped and draped in sterile fashion for the possibility of a vein graft.  The patient was given intravenous antibiotics during the  case.  The incision was made with a 15 blade scalpel and extended through the subcutaneous and platysma layer using Bovie electrocautery.  Subplatysmal flaps were elevated superiorly and inferiorly and a self-retaining retractor was added.  The sternocleidomastoid muscle was skeletonized up to superiorly where the digastric muscle was encountered and dissected more medially.  This led to dissection through the fatty soft tissues of the upper neck down to the carotid artery, which was easily identified and was then skeletonized directed inferiorly from the carotid bulb and then superiorly following first the external carotid artery and then the internal carotid artery.  This allowed isolation of the carotid bifurcation.  The tumor was identified in the crotch of the carotid bifurcation and was then carefully dissected from the carotid branches using fine dissection and bipolar electrocautery.  This was carefully performed around the periphery along first the internal carotid and then the external carotid artery and then was dissected from the crotch of the bifurcation.  The tumor was also dissected from connective tissues superiorly, and ultimately was able to be removed and passed to Nursing for pathology.  Bleeding was controlled with bipolar electrocautery. Dr. Doren Custard assisted throughout this portion of the case and then evaluated the common, internal, and external carotid arteries with Doppler  and there was good flow in all 3.  At this point, the wound was copiously irrigated with saline and a 7-French round drain was placed in the depth of the wound and secured to the skin using 2-0 nylon suture in a standard drain stitch.  The platysma layer was closed with 3-0 Vicryl suture in simple running fashion.  The subcutaneous layer was closed with 4-0 Vicryl suture in a simple interrupted fashion.  The skin was closed with Dermabond.  Drapes were removed.  The patient was cleaned off.  The  drain was taped to the left shoulder.  She was then returned to anesthesia for wake-up and was extubated and moved to the recovery room in stable condition.     Onnie Graham, MD     DDB/MEDQ  D:  10/27/2017  T:  10/28/2017  Job:  [id number]

## 2017-10-28 NOTE — Progress Notes (Signed)
   ENT Progress Note: POD #1 s/p Procedure(s): EXCISION CAROTID BODY TUMOR   Subjective: tol po, no sig pain  Objective: Vital signs in last 24 hours: Temp:  [97.5 F (36.4 C)-98.5 F (36.9 C)] 98.4 F (36.9 C) (02/09 0600) Pulse Rate:  [40-80] 80 (02/09 0600) Resp:  [12-23] 18 (02/09 0600) BP: (117-154)/(60-82) 138/60 (02/09 0600) SpO2:  [91 %-100 %] 97 % (02/09 0600) Arterial Line BP: (158)/(59) 158/59 (02/08 0900) Weight:  [70.3 kg (155 lb)] 70.3 kg (155 lb) (02/08 0920) Weight change: -0.392 kg (-13.8 oz) Last BM Date: 10/24/17  Intake/Output from previous day: 02/08 0701 - 02/09 0700 In: 3710 [I.V.:3560] Out: 452 [Urine:400; Drains:32; Blood:20] Intake/Output this shift: No intake/output data recorded.  Labs: Recent Labs    10/26/17 0707 10/27/17 0500  WBC 5.0 13.1*  HGB 11.7* 10.3*  HCT 36.5 32.2*  PLT 185 181   Recent Labs    10/26/17 0707 10/27/17 0500  NA 140 142  K 3.3* 3.7  CL 104 112*  CO2 24 20*  GLUCOSE 111* 147*  BUN 15 14  CALCIUM 10.0 8.8*    Studies/Results: No results found.   PHYSICAL EXAM: Inc intact, no swelling JP removed Airway stable and normal voice   Assessment/Plan: Pt stable D/C to home    Alonni Heimsoth 10/28/2017, 8:49 AM

## 2017-10-28 NOTE — Care Management Note (Signed)
Case Management Note  Patient Details  Name: Heather Cobb MRN: [id number] Date of Birth: Feb 14, 1941  Subjective/Objective:              Pt from home with daughter for removal of mass from neck.  Pt to return home with daughter.  Pt and daughter state there are no barriers to health care and pt is able to care for self with minimal assistance.       Action/Plan: No CM needs at this time.   Expected Discharge Date:  10/28/17               Expected Discharge Plan:  Home/Self Care  In-House Referral:  NA  Discharge planning Services  CM Consult  Post Acute Care Choice:  NA Choice offered to:  NA  DME Arranged:    DME Agency:     HH Arranged:    HH Agency:     Status of Service:  Completed, signed off  If discussed at [employer] of Stay Meetings, dates discussed:    Additional Comments:  Claudie Leach, RN 10/28/2017, 1:50 PM

## 2017-10-30 ENCOUNTER — Encounter (HOSPITAL_COMMUNITY): Payer: Self-pay | Admitting: Interventional Radiology

## 2017-10-30 LAB — TYPE AND SCREEN
ABO/RH(D): A NEG
Antibody Screen: POSITIVE
UNIT DIVISION: 0
UNIT DIVISION: 0

## 2017-10-30 LAB — BPAM RBC
BLOOD PRODUCT EXPIRATION DATE: 201902222359
Blood Product Expiration Date: 201902232359
ISSUE DATE / TIME: 201902081008
ISSUE DATE / TIME: 201902081008
Unit Type and Rh: 600
Unit Type and Rh: 600

## 2017-10-30 NOTE — Addendum Note (Signed)
Addendum  created 10/30/17 0814 by Josephine Igo, CRNA   Intraprocedure Meds edited

## 2017-11-01 NOTE — Consult Note (Signed)
            [facility] CM Primary Care Navigator  11/01/2017  Heather Cobb 06/22/1941 [id number]   Attempt to seepatient at the bedsideto identify possible discharge needs but she was alreadydischargedhomeper staff report.   Per MD note, patient was admitted for excision of carotid body tumor.  Primary care provider's office called (Dr. Susann Givens per [place]) to notify of patient's discharge and for transition of care (TOC) follow-up. Made aware to refer patient to [facility] CM if deemed necessary and appropriate for services.  Patient has discharge instruction to follow-up with otolaryngology for a visit in 1 week.   For additional questions please contact:  Edwena Felty A. Kera Deacon, BSN, RN-BC [facility] PRIMARY CARE Navigator Cell: [phone]

## 2017-11-03 ENCOUNTER — Inpatient Hospital Stay (HOSPITAL_COMMUNITY): Payer: Medicare HMO | Attending: Internal Medicine | Admitting: Internal Medicine

## 2017-11-03 ENCOUNTER — Other Ambulatory Visit: Payer: Self-pay

## 2017-11-03 ENCOUNTER — Encounter (HOSPITAL_COMMUNITY): Payer: Self-pay | Admitting: Internal Medicine

## 2017-11-03 VITALS — BP 144/82 | HR 64 | Temp 98.0°F | Resp 16 | Ht 64.0 in | Wt 155.0 lb

## 2017-11-03 DIAGNOSIS — D447 Neoplasm of uncertain behavior of aortic body and other paraganglia: Secondary | ICD-10-CM

## 2017-11-03 DIAGNOSIS — D446 Neoplasm of uncertain behavior of carotid body: Secondary | ICD-10-CM | POA: Diagnosis present

## 2017-11-03 DIAGNOSIS — E039 Hypothyroidism, unspecified: Secondary | ICD-10-CM | POA: Diagnosis not present

## 2017-11-03 DIAGNOSIS — I10 Essential (primary) hypertension: Secondary | ICD-10-CM | POA: Diagnosis not present

## 2017-11-03 DIAGNOSIS — L7632 Postprocedural hematoma of skin and subcutaneous tissue following other procedure: Secondary | ICD-10-CM | POA: Insufficient documentation

## 2017-11-03 NOTE — Patient Instructions (Addendum)
[place] at [facility] Discharge Instructions  RECOMMENDATIONS MADE BY THE CONSULTANT AND ANY TEST RESULTS WILL BE SENT TO YOUR REFERRING PHYSICIAN.  Today you saw Dr. 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 We will call you to let you know whether Radiation wants to see you. Dr. Walden Field will talk with a radiation oncologist to today or early next week to see if she is a candidate for radiation.    Return in 6 weeks to see MD  If you have not heard from [place], please call our Nurse Navigator [place] @ [phone] to find out what Radiation's recommendation for you is.    Thank you for choosing [place] at [facility] to provide your oncology and hematology care.  To afford each patient quality time with our provider, please arrive at least 15 minutes before your scheduled appointment time.    If you have a lab appointment with the [facility] please come in thru the  Main Entrance and check in at the main information desk  You need to re-schedule your appointment should you arrive 10 or more minutes late.  We strive to give you quality time with our providers, and arriving late affects you and other patients whose appointments are after yours.  Also, if you no show three or more times for appointments you may be dismissed from the clinic at the providers discretion.     Again, thank you for choosing [facility].  Our hope is that these requests will decrease the amount of time that you wait before being seen by our physicians.       _____________________________________________________________  Should you have questions after your visit to [facility], please contact our office at [phone] between the hours of 8:30 a.m. and 4:30 p.m.  Voicemails left after 4:30 p.m. will not be returned until the following business day.  For prescription refill requests, have your pharmacy contact our office.        Resources For Cancer Patients and their Caregivers ? American Cancer Society: Can assist with transportation, wigs, general needs, runs Look Good Feel Better.        ([phone] 422-5082 ? Cancer Care: Provides financial assistance, online support groups, medication/co-pay assistance.  1-800-813-HOPE 978-472-2222) ? [place] Assists [place] cancer patients and their families through emotional , educational and financial support.  365-025-6704 ? [place] DSS Where to apply for food stamps, Medicaid and utility assistance. 916-549-7393 ? [employer]: Transportation to medical appointments. 720-423-2292 ? Social Security Administration: May apply for disability if have a Stage IV cancer. 684-588-1847 661-742-6821 ? [employer], Disability and Transit Services: Assists with nutrition, care and transit needs. [place] Support Programs: @10RELATIVEDAYS @ > Cancer Support Group  2nd Tuesday of the month 1pm-2pm, Journey Room  > Creative Journey  3rd Tuesday of the month 1130am-1pm, Journey Room  > Look Good Feel Better  1st Wednesday of the month 10am-12 noon, Journey Room (Call [place] to register [phone])

## 2017-11-10 ENCOUNTER — Encounter: Payer: Self-pay | Admitting: Radiation Oncology

## 2017-11-13 NOTE — Progress Notes (Signed)
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 Luan Pulling with "Knot" on the right side of neck for 1 month.  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  No evidence of abnormally prominent lymph nodes.  Pt underwent coil embolization of her left carotid body tumor 10/26/2017 with Dr. Estanislado Pandy.  10/27/2017 patient underwent tumor excision of left carotid body with Dr. Redmond Baseman and Dr. Scot Dock.  Biopsies of  Left carotid body tumor. 10/27/2017  Revealed:    Nutrition Status Yes No Comments  Weight changes? []  [x]    Swallowing concerns? []  [x]    PEG? []  [x]     Referrals Yes No Comments  Social Work? []  [x]    Dentistry? []  [x]    Swallowing therapy? []  [x]    Nutrition? []  [x]    Med/Onc? [x]  []  Dr. Talbert Cage 08/21/2017   Safety Issues Yes No Comments  Prior radiation? []  [x]    Pacemaker/ICD? []  [x]    Possible current pregnancy? []  [x]    Is the patient on methotrexate? []  [x]     Tobacco/Marijuana/Snuff/ETOH use: n/a  Past/Anticipated interventions by otolaryngology, if any:  Tumor excision carotid body (left) 10/27/2017. Dr. Redmond Baseman   Past/Anticipated interventions by medical oncology, if any:   Pt saw Dr. Talbert Cage 08/21/2017.  Options for locoregional management can include surgical resection or radiation therapy (RT). Less invasive approaches, such as conventionally-fractionated external beam RT or stereotactic body RT (SBRT), may provide high rates of long-term disease control for skull base and neck paragangliomas or for non-skull base and neck paragangliomas that are unresectable, but they do not offer the same degree of symptom relief that is accomplished with resection.   -I have made a stat referral to vascular surgery for evaluation and treatment.  -Prior to resection, catecholamine secretion should be assessed  biochemically in all patients with suspected paraganglioma, even if they do not present with a clinical picture of catecholamine hypersecretion. Undiagnosed catecholamine hypersecretion in patients without symptoms of catecholamine excess is not uncommon and can cause major morbidity and mortality during resection.   -I have ordered for 24 hour urine catecholamines and metanephrines.  -RTC in 2 months for follow up.  Wt Readings from Last 3 Encounters:  11/14/17 154 lb 9.6 oz (70.1 kg)  11/03/17 155 lb (70.3 kg)  10/27/17 155 lb (70.3 kg)   BP (!) 160/74 (BP Location: Left Arm, Patient Position: Sitting, Cuff Size: Normal)   Pulse 62   Temp 97.7 F (36.5 C) (Oral)   Resp 18   Ht 5\' 4"  (1.626 m)   Wt 154 lb 9.6 oz (70.1 kg)   SpO2 100%   BMI 26.54 kg/m    Current Complaints / other details:

## 2017-11-14 ENCOUNTER — Ambulatory Visit
Admission: RE | Admit: 2017-11-14 | Discharge: 2017-11-14 | Disposition: A | Discharge status: Home / Self Care | Payer: Medicare HMO | Source: Ambulatory Visit | Attending: Radiation Oncology | Admitting: Radiation Oncology

## 2017-11-14 ENCOUNTER — Other Ambulatory Visit: Payer: Self-pay

## 2017-11-14 VITALS — BP 160/74 | HR 62 | Temp 97.7°F | Resp 18 | Ht 64.0 in | Wt 154.6 lb

## 2017-11-14 DIAGNOSIS — G4730 Sleep apnea, unspecified: Secondary | ICD-10-CM | POA: Diagnosis not present

## 2017-11-14 DIAGNOSIS — E538 Deficiency of other specified B group vitamins: Secondary | ICD-10-CM | POA: Diagnosis not present

## 2017-11-14 DIAGNOSIS — I10 Essential (primary) hypertension: Secondary | ICD-10-CM | POA: Insufficient documentation

## 2017-11-14 DIAGNOSIS — M436 Torticollis: Secondary | ICD-10-CM | POA: Insufficient documentation

## 2017-11-14 DIAGNOSIS — E039 Hypothyroidism, unspecified: Secondary | ICD-10-CM | POA: Diagnosis not present

## 2017-11-14 DIAGNOSIS — Z87440 Personal history of urinary (tract) infections: Secondary | ICD-10-CM | POA: Diagnosis not present

## 2017-11-14 DIAGNOSIS — D649 Anemia, unspecified: Secondary | ICD-10-CM | POA: Insufficient documentation

## 2017-11-14 DIAGNOSIS — Z8589 Personal history of malignant neoplasm of other organs and systems: Secondary | ICD-10-CM | POA: Insufficient documentation

## 2017-11-14 DIAGNOSIS — D447 Neoplasm of uncertain behavior of aortic body and other paraganglia: Secondary | ICD-10-CM

## 2017-11-14 DIAGNOSIS — M2560 Stiffness of unspecified joint, not elsewhere classified: Secondary | ICD-10-CM

## 2017-11-14 DIAGNOSIS — Z79899 Other long term (current) drug therapy: Secondary | ICD-10-CM | POA: Insufficient documentation

## 2017-11-14 DIAGNOSIS — R42 Dizziness and giddiness: Secondary | ICD-10-CM | POA: Insufficient documentation

## 2017-11-14 NOTE — Addendum Note (Signed)
Encounter addended by: Malena Edman, RN on: 11/14/2017 1:00 PM  Actions taken: Charge Capture section accepted

## 2017-11-14 NOTE — Addendum Note (Signed)
Encounter addended by: Cori Razor, RN on: 11/14/2017 1:04 PM  Actions taken: Charge Capture section accepted

## 2017-11-14 NOTE — Progress Notes (Signed)
Radiation Oncology         [phone] ________________________________  Name: Heather Cobb        MRN: [id number]  Date of Service: 11/14/2017 DOB: May 22, 1941  VP:XTGGYIR, [name], MD  [name], MD     REFERRING PHYSICIAN: Higgs, Mathis Dad, MD   DIAGNOSIS: The encounter diagnosis was Paraganglioma [facility]).   HISTORY OF PRESENT ILLNESS: Heather Cobb is a 77 y.o. female seen at the request of Dr. Walden Field. The patient noticed a one month history of a right-sided neck lump in November 2018. She presented to her PCP and underwent CT scan of the neck on 08/15/17. 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 The patient was then evaluated by Dr. Scot Dock in vascular surgery and she was diagnosed with paraganglioma tumor. She underwent coil embolization of her left carotid body tumor with Dr. Estanislado Pandy on 10/26/17. She was then referred to Dr. Redmond Baseman in ENT. She underwent excision of the left carotid body on 10/27/17. Final pathology showed a 1.3 x 0.9 x 0.6 cm focally well defined paraganglioma tumor. She presents to radiation oncology to discuss the possibility of radiation therapy as part of her disease management.   PREVIOUS RADIATION THERAPY: No   PAST MEDICAL HISTORY:  Past Medical History:  Diagnosis Date  . Anemia   . Carotid body tumor ([place])    left  . Hypertension   . Hypothyroid   . Pyelonephritis   . Sleep apnea    Stop Bang score of 4  . Vertigo   . Vitamin B 12 deficiency        PAST SURGICAL HISTORY: Past Surgical History:  Procedure Laterality Date  . ABDOMINAL HYSTERECTOMY    . CATARACT EXTRACTION W/ INTRAOCULAR LENS  IMPLANT, BILATERAL    . CHOLECYSTECTOMY N/A 04/15/2015   Procedure: LAPAROSCOPIC CHOLECYSTECTOMY;  Surgeon: Aviva Signs, MD;  Location: [facility];  Service: General;  Laterality: N/A;  . COLONOSCOPY  2012   [place]  . COLONOSCOPY WITH PROPOFOL N/A 02/03/2016   Procedure: COLONOSCOPY WITH PROPOFOL;   Surgeon: Robert Bellow, MD;  Location: [facility];  Service: Endoscopy;  Laterality: N/A;  . IR ANGIO EXTERNAL CAROTID SEL EXT CAROTID UNI L MOD SED  10/26/2017  . IR ANGIO INTRA EXTRACRAN SEL COM CAROTID INNOMINATE BILAT MOD SED  10/26/2017  . IR ANGIO VERTEBRAL SEL VERTEBRAL UNI R MOD SED  10/26/2017  . IR ANGIOGRAM EXTREMITY LEFT  10/26/2017  . IR ANGIOGRAM FOLLOW UP STUDY  10/26/2017  . IR NEURO EACH ADD'L AFTER BASIC UNI LEFT (MS)  10/26/2017  . IR RADIOLOGIST EVAL & MGMT  09/21/2017  . IR TRANSCATH/EMBOLIZ  10/26/2017  . MASS EXCISION Left 10/27/2017   Procedure: EXCISION CAROTID BODY TUMOR;  Surgeon: Melida Quitter, MD;  Location: [place];  Service: ENT;  Laterality: Left;  . RADIOLOGY WITH ANESTHESIA N/A 10/26/2017   Procedure: EMBOLIZATION;  Surgeon: Luanne Bras, MD;  Location: [place];  Service: Radiology;  Laterality: N/A;  . UPPER GASTROINTESTINAL ENDOSCOPY  2014  . VESICOVAGINAL FISTULA CLOSURE W/ TAH       FAMILY HISTORY:  Family History  Problem Relation Age of Onset  . Asthma Mother   . Hypertension Daughter   . Cancer Neg Hx      SOCIAL HISTORY:  reports that  has never smoked. she has never used smokeless tobacco. She reports that she does not drink alcohol or use drugs. The patient is widowed and lives  in [place].   ALLERGIES: Codeine   MEDICATIONS:  Current Outpatient Medications  Medication Sig Dispense Refill  . cholecalciferol (VITAMIN D) 1000 UNITS tablet Take 1,000 Units by mouth daily.    . ferrous fumarate (HEMOCYTE - 106 MG FE) 325 (106 FE) MG TABS Take 1 tablet (106 mg of iron total) by mouth daily. 30 each 0  . levothyroxine (SYNTHROID, LEVOTHROID) 88 MCG tablet Take 88 mcg by mouth daily before breakfast.     . linaclotide (LINZESS) 145 MCG CAPS capsule Take 145 mcg by mouth daily as needed (constipation).    . naproxen sodium (ALEVE) 220 MG tablet Take 220 mg by mouth daily as needed (headaches).    . olmesartan-hydrochlorothiazide (BENICAR HCT)  40-12.5 MG per tablet Take 1 tablet by mouth daily.    . vitamin B-12 (CYANOCOBALAMIN) 100 MCG tablet Take 100 mcg by mouth daily.    Marland Kitchen HYDROcodone-acetaminophen (NORCO/VICODIN) 5-325 MG tablet Take 1-2 tablets by mouth every 6 (six) hours as needed for moderate pain. (Patient not taking: Reported on 11/14/2017) 20 tablet 0  . promethazine (PHENERGAN) 12.5 MG tablet Take 1 tablet (12.5 mg total) by mouth every 6 (six) hours as needed for nausea or vomiting. (Patient not taking: Reported on 11/14/2017) 10 tablet 0   No current facility-administered medications for this encounter.      REVIEW OF SYSTEMS: On review of systems, the patient reports that she is doing well overall. She denies any chest pain, shortness of breath, cough, fevers, chills, night sweats, unintended weight changes. She denies any bowel or bladder disturbances, and denies abdominal pain, nausea or vomiting. she denies any new musculoskeletal or joint aches or pains. A complete review of systems is obtained and is otherwise negative.     PHYSICAL EXAM:  Wt Readings from Last 3 Encounters:  11/14/17 154 lb 9.6 oz (70.1 kg)  11/03/17 155 lb (70.3 kg)  10/27/17 155 lb (70.3 kg)   Temp Readings from Last 3 Encounters:  11/14/17 97.7 F (36.5 C) (Oral)  11/03/17 98 F (36.7 C) (Oral)  10/28/17 99.4 F (37.4 C) (Oral)   BP Readings from Last 3 Encounters:  11/14/17 (!) 160/74  11/03/17 (!) 144/82  10/28/17 129/70   Pulse Readings from Last 3 Encounters:  11/14/17 62  11/03/17 64  10/28/17 64   Pain Assessment Pain Score: 0-No pain/10  In general this is a well appearing African American woman in no acute distress. She's alert and oriented x4 and appropriate throughout the examination. Cardiopulmonary assessment is negative for acute distress and she exhibits normal effort.  Well healed left neck incision with no evidence of edema or fluid collection. No adenopathy is noted. There was mild induration consistent with  her recent surgery.She does have some tightness of the neck with rotation and admits to some decreased range of motion.    ECOG = 1  0 - Asymptomatic (Fully active, able to carry on all predisease activities without restriction)  1 - Symptomatic but completely ambulatory (Restricted in physically strenuous activity but ambulatory and able to carry out work of a light or sedentary nature. For example, light housework, office work)  2 - Symptomatic, <50% in bed during the day (Ambulatory and capable of all self care but unable to carry out any work activities. Up and about more than 50% of waking hours)  3 - Symptomatic, >50% in bed, but not bedbound (Capable of only limited self-care, confined to bed or chair 50% or more of waking hours)  4 - Bedbound (Completely disabled. Cannot carry on any self-care. Totally confined to bed or chair)  5 - Death   Eustace Pen MM, Creech RH, Tormey DC, et al. [phone]). "Toxicity and response criteria of the [facility]". [place] Oncol. 5 (6): 649-55    LABORATORY DATA:  Lab Results  Component Value Date   WBC 13.1 (H) 10/27/2017   HGB 10.3 (L) 10/27/2017   HCT 32.2 (L) 10/27/2017   MCV 88.2 10/27/2017   PLT 181 10/27/2017   Lab Results  Component Value Date   NA 142 10/27/2017   K 3.7 10/27/2017   CL 112 (H) 10/27/2017   CO2 20 (L) 10/27/2017   Lab Results  Component Value Date   ALT 13 (L) 10/26/2017   AST 22 10/26/2017   ALKPHOS 63 10/26/2017   BILITOT 0.6 10/26/2017      RADIOGRAPHY: Ir Angiogram Extremity Left  Result Date: 10/30/2017 CLINICAL DATA:  Left neck mass. CT suggestive of carotid body tumor. Pre-surgical embolization. EXAM: TRANSCATHETER THERAPY EMBOLIZATION; LEFT EXTREMITY ARTERIOGRAPHY; BILATERAL COMMON CAROTID AND INNOMINATE ANGIOGRAPHY; IR ANGIO VERTEBRAL SEL VERTEBRAL UNI RIGHT MOD SED; IR ANGIO EXTERNAL CAROTID SEL EXT CAROTID UNI LEFT MOD SED; ARTERIOGRAPHY; IR NEURO EACH ADD'L AFTER BASIC UNI  LEFT (MS) COMPARISON:  CT angiogram of the head and neck of 08/15/2017. MEDICATIONS: Heparin 3500 units IV; Ancef 2 g IV antibiotic was administered within 1 hour of the procedure. ANESTHESIA/SEDATION: Mac anesthesia for the diagnostic portion. General anesthesia by the Department of Anesthesiology at [facility] for the intervention treatment. CONTRAST:  Isovue 300 approximately 120 cc. FLUOROSCOPY TIME:  Fluoroscopy Time: 47 minutes 36 seconds (2410. mGy). COMPLICATIONS: None immediate. TECHNIQUE: Informed written consent was obtained from the patient after a thorough discussion of the procedural risks, benefits and alternatives. All questions were addressed. Maximal Sterile Barrier Technique was utilized including caps, mask, sterile gowns, sterile gloves, sterile drape, hand hygiene and skin antiseptic. A timeout was performed prior to the initiation of the procedure. The right groin was prepped and draped in the usual sterile fashion. Thereafter using modified Seldinger technique, transfemoral access into the right common femoral artery was obtained without difficulty. Over a 0.035 inch guidewire, a 5 French Pinnacle sheath was inserted. Through this, and also over 0.035 inch guidewire, a 5 [place] JB 1 catheter was advanced to the aortic arch region and selectively positioned in the right common carotid artery, , the right vertebral artery, the left common carotid artery, left external carotid artery and the left subclavian artery. FINDINGS: The right common carotid arteriogram demonstrates the right external carotid artery and its major branches to be widely patent. The right internal carotid artery at the bulb to the cranial skull base opacifies normally. The petrous, cavernous and supraclinoid segments demonstrate wide patency. The right middle cerebral artery and the right anterior cerebral artery opacify into the capillary and venous phases. The origin of the right vertebral artery is widely  patent. The vessel demonstrates mild tortuosity in its extracranial course. Wide patency is seen of the right vertebrobasilar junction, and the right posterior-inferior cerebellar artery. The opacified portion of the basilar artery, the posterior cerebral arteries, the superior cerebellar arteries and the anterior-inferior cerebellar arteries is normal into the capillary and venous phases. There is retrograde opacification into the left vertebrobasilar junction to the level of the left posterior-inferior cerebellar artery. There is no antegrade clearance of contrast. The left subclavian arteriogram demonstrates nonvisualization of the left vertebral artery. The ascending cervical  branch of the thyrocervical trunk is seen to enter into the cranial skull base without reconstitution of the left vertebral artery. The left common carotid arteriogram demonstrates the left internal carotid artery at the bulb to the cranial skull base to be widely patent. The petrous, the cavernous and the supraclinoid segments demonstrate wide patency. A left posterior communicating artery is seen opacifying the left posterior cerebral artery distribution. The left middle cerebral artery and the left anterior cerebral artery opacify into the capillary and venous phases. The left external carotid artery and the left internal carotid artery demonstrate splaying of the 2 vessels at their origin. A large prominent homogeneous blush is seen in the splayed portion of the left external and the left internal carotid arteries at their origins. There is a large draining vein emanating from the lateral inferior aspect of this tumor blush. A selective left external carotid arteriogram demonstrates abnormally prominent ascending pharyngeal artery which courses along the superior and lateral aspect of this hyper vascular mass with multiple minute feeders. ENDOVASCULAR TREATMENT OF A PROMINENT LARGE HYPERVASCULAR MASS AT THE LEFT CAROTID BIFURCATION  PRIOR TO SURGICAL REMOVAL. The angio findings were reviewed with patient. She was again informed of the intent to reduce the vascularity within the mass prior to her surgery. Questions were answered regarding the procedure. The patient agreed to proceed with the endovascular devascularization of the hypervascular mass in the left carotid bifurcation. The patient was then put under general anesthesia by the Department of Anesthesiology at [facility]. The diagnostic JB 1 catheter in the left common carotid artery was then exchanged over a 0.035 inch 300 cm Rosen exchange guidewire for a 6 French 65 cm neurovascular Arrow sheath using biplane roadmap technique and constant fluoroscopic guidance. Good aspiration obtained from the side port of the hypervascular sheath. A gentle contrast injection demonstrated no evidence of spasms, dissections or of intraluminal filling defects in the left common carotid artery. This was then connected to continuous heparinized saline infusion. Over the [facility] exchange guidewire, a 90 cm MDP Brite tip Envoy guide catheter was then advanced and positioned in the proximal left external carotid artery. The guidewire was removed. Good aspiration obtained from the hub of 6 [place] Brite tip Envoy guide catheter. A gentle control arteriogram demonstrated again no evidence of spasms, dissections or of intraluminal filling defects. A rapid transit 2 tip microcatheter was then advanced over a 0.014 inch regular Synchro micro guidewire to the distal end of the MDP guide catheter in the left external carotid artery. Using a torque device, the wire was then manipulated to advance into the abnormally prominent ascending pharyngeal branch supplying this hypervascular mass. This was then followed by the microcatheter. However, the microcatheter could not be advanced into the distal 1/3 of the feeder supplying this tumor. This was left at the mid 1/3. The guidewire was removed. Again there was  good aspiration of contrast at the hub of the microcatheter. A gentle contrast injection through the microcatheter demonstrated two abnormally prominent inferiorly projecting vessels supplying the main body of the hypervascular mass. In order to prevent inadvertent embolization of any abnormal vasculature distal to these branches, a Target 360 Nano coil 2 mm x 4 cm was advanced and delivered just distal to the origins of these two main branches supplying the hypovascular mass. Again this was performed under biplane roadmap technique and constant fluoroscopic guidance, in a coaxial manner and with constant heparinized saline infusion. A control arteriogram performed through the microcatheter after delivery of  the coil demonstrated near complete occlusion with retrograde opacification into the two main branches supplying the hypervascular mass. With the microcatheter in this position, PVA particles of sizes 350-500 microns were then injected mixed with 75% contrast and 25% heparinized saline infusion. Prior to the injection of the embolic material super selectely, a gentle control arteriogram was obtained through the microcatheter to ensure no dangerous communications between the external carotid artery and the internal carotid artery and the left vertebral artery. The embolizations are performed under constant native biplane control. Embolization was continued until there was reflux of the tip of the microcatheter. Once there, the microcatheter tip was then gently retrieved slightly more proximally. A control arteriogram performed through the microcatheter demonstrated complete occlusion of the feeders supplying the main body of the hypervascular mass. This microcatheter was then removed. The MDP Envoy guide catheter was then retrieved slightly more proximally to obtained left external carotid arteriogram. This revealed almost 85% devascularization of the tumor. The small tributaries arising at the bifurcation  origins of the left external carotid artery and the left internal carotid artery were unembolizable because of their teeny size. The guide catheter was then retrieved into the left common carotid artery. A control arteriogram performed through this and centered intracranially demonstrated no change in the intracranial circulation. No evidence of intraluminal filling defects or of occlusions were seen. The 6 [place] MDP guide catheter, and the neurovascular sheath were then retrieved into the abdominal aorta and exchanged over a J-tip guidewire for a 6 French Pinnacle sheath. This in turn was then removed with the successful application of an external closure device. The right groin appeared soft without evidence of a hematoma or bleeding. Distal pulses remained palpable in the dorsalis pedis and posterior tibial regions bilaterally at the end of the procedure. After the procedure, the patient's ACT was maintained in the region of approximately 180 seconds. No acute hemodynamic or neurological changes were seen during the procedure. The patient's general anesthesia was then reversed and the patient was extubated without difficulty. Upon recovery the patient demonstrated no new neurological signs or symptoms. Of note, the patient's vision was normal and she did not have any difficulty swallowing with no evidence of facial numbness, facial droop on the left side, and the patient's tongue was midline. 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 IMPRESSION: Status post endovascular devascularization of approximately 85% of the prominent hypervascular mass at the left common carotid bifurcation using PVA particles of sizes 350 to 500 microns as described without event. PLAN: Patient transferred to the neuro ICU. Electronically Signed   By: Luanne Bras M.D.   On: 10/27/2017 13:42     Ir Transcath/emboliz  Result Date: 10/30/2017 CLINICAL DATA:  Left neck mass. CT suggestive of carotid body tumor. Pre-surgical embolization. EXAM: TRANSCATHETER THERAPY EMBOLIZATION; LEFT EXTREMITY ARTERIOGRAPHY; BILATERAL COMMON CAROTID AND INNOMINATE ANGIOGRAPHY; IR ANGIO VERTEBRAL SEL VERTEBRAL UNI RIGHT MOD SED; IR ANGIO EXTERNAL CAROTID SEL EXT CAROTID UNI LEFT MOD SED; ARTERIOGRAPHY; IR NEURO EACH ADD'L AFTER BASIC UNI LEFT (MS) COMPARISON:  CT angiogram of the head and neck of 08/15/2017. MEDICATIONS: Heparin 3500 units IV; Ancef 2 g IV antibiotic was administered within 1 hour of the procedure. ANESTHESIA/SEDATION: Mac anesthesia for the diagnostic portion. General anesthesia by the Department of Anesthesiology at [facility] for the intervention treatment. CONTRAST:  Isovue 300 approximately 120 cc.  FLUOROSCOPY TIME:  Fluoroscopy Time: 47 minutes 36 seconds (2410. mGy). COMPLICATIONS: None immediate. TECHNIQUE: Informed written consent was obtained from the patient after a thorough discussion of the procedural risks, benefits and alternatives. All questions were addressed. Maximal Sterile Barrier Technique was utilized including caps, mask, sterile gowns, sterile gloves, sterile drape, hand hygiene and skin antiseptic. A timeout was performed prior to the initiation of the procedure. The right groin was prepped and draped in the usual sterile fashion. Thereafter using modified Seldinger technique, transfemoral access into the right common femoral artery was obtained without difficulty. Over a 0.035 inch guidewire, a 5 French Pinnacle sheath was inserted. Through this, and also over 0.035 inch guidewire, a 5 [place] JB 1 catheter was advanced to the aortic arch region and selectively positioned in the right common carotid artery, , the right vertebral artery, the left common carotid artery, left external carotid artery and the left subclavian artery. FINDINGS: The right common carotid  arteriogram demonstrates the right external carotid artery and its major branches to be widely patent. The right internal carotid artery at the bulb to the cranial skull base opacifies normally. The petrous, cavernous and supraclinoid segments demonstrate wide patency. The right middle cerebral artery and the right anterior cerebral artery opacify into the capillary and venous phases. The origin of the right vertebral artery is widely patent. The vessel demonstrates mild tortuosity in its extracranial course. Wide patency is seen of the right vertebrobasilar junction, and the right posterior-inferior cerebellar artery. The opacified portion of the basilar artery, the posterior cerebral arteries, the superior cerebellar arteries and the anterior-inferior cerebellar arteries is normal into the capillary and venous phases. There is retrograde opacification into the left vertebrobasilar junction to the level of the left posterior-inferior cerebellar artery. There is no antegrade clearance of contrast. The left subclavian arteriogram demonstrates nonvisualization of the left vertebral artery. The ascending cervical branch of the thyrocervical trunk is seen to enter into the cranial skull base without reconstitution of the left vertebral artery. The left common carotid arteriogram demonstrates the left internal carotid artery at the bulb to the cranial skull base to be widely patent. The petrous, the cavernous and the supraclinoid segments demonstrate wide patency. A left posterior communicating artery is seen opacifying the left posterior cerebral artery distribution. The left middle cerebral artery and the left anterior cerebral artery opacify into the capillary and venous phases. The left external carotid artery and the left internal carotid artery demonstrate splaying of the 2 vessels at their origin. A large prominent homogeneous blush is seen in the splayed portion of the left external and the left internal carotid  arteries at their origins. There is a large draining vein emanating from the lateral inferior aspect of this tumor blush. A selective left external carotid arteriogram demonstrates abnormally prominent ascending pharyngeal artery which courses along the superior and lateral aspect of this hyper vascular mass with multiple minute feeders. ENDOVASCULAR TREATMENT OF A PROMINENT LARGE HYPERVASCULAR MASS AT THE LEFT CAROTID BIFURCATION PRIOR TO SURGICAL REMOVAL. The angio findings were reviewed with patient. She was again informed of the intent to reduce the vascularity within the mass prior to her surgery. Questions were answered regarding the procedure. The patient agreed to proceed with the endovascular devascularization of the hypervascular mass in the left carotid bifurcation. The patient was then put under general anesthesia by the Department of Anesthesiology at [facility]. The diagnostic [place] 1 catheter in the left common carotid artery was then exchanged over a 0.035  inch 300 cm Constance Holster exchange guidewire for a 6 French 65 cm neurovascular Arrow sheath using biplane roadmap technique and constant fluoroscopic guidance. Good aspiration obtained from the side port of the hypervascular sheath. A gentle contrast injection demonstrated no evidence of spasms, dissections or of intraluminal filling defects in the left common carotid artery. This was then connected to continuous heparinized saline infusion. Over the [facility] exchange guidewire, a 90 cm MDP Brite tip Envoy guide catheter was then advanced and positioned in the proximal left external carotid artery. The guidewire was removed. Good aspiration obtained from the hub of 6 [place] Brite tip Envoy guide catheter. A gentle control arteriogram demonstrated again no evidence of spasms, dissections or of intraluminal filling defects. A rapid transit 2 tip microcatheter was then advanced over a 0.014 inch regular Synchro micro guidewire to the distal end of the MDP  guide catheter in the left external carotid artery. Using a torque device, the wire was then manipulated to advance into the abnormally prominent ascending pharyngeal branch supplying this hypervascular mass. This was then followed by the microcatheter. However, the microcatheter could not be advanced into the distal 1/3 of the feeder supplying this tumor. This was left at the mid 1/3. The guidewire was removed. Again there was good aspiration of contrast at the hub of the microcatheter. A gentle contrast injection through the microcatheter demonstrated two abnormally prominent inferiorly projecting vessels supplying the main body of the hypervascular mass. In order to prevent inadvertent embolization of any abnormal vasculature distal to these branches, a Target 360 Nano coil 2 mm x 4 cm was advanced and delivered just distal to the origins of these two main branches supplying the hypovascular mass. Again this was performed under biplane roadmap technique and constant fluoroscopic guidance, in a coaxial manner and with constant heparinized saline infusion. A control arteriogram performed through the microcatheter after delivery of the coil demonstrated near complete occlusion with retrograde opacification into the two main branches supplying the hypervascular mass. With the microcatheter in this position, PVA particles of sizes 350-500 microns were then injected mixed with 75% contrast and 25% heparinized saline infusion. Prior to the injection of the embolic material super selectely, a gentle control arteriogram was obtained through the microcatheter to ensure no dangerous communications between the external carotid artery and the internal carotid artery and the left vertebral artery. The embolizations are performed under constant native biplane control. Embolization was continued until there was reflux of the tip of the microcatheter. Once there, the microcatheter tip was then gently retrieved slightly more  proximally. A control arteriogram performed through the microcatheter demonstrated complete occlusion of the feeders supplying the main body of the hypervascular mass. This microcatheter was then removed. The MDP Envoy guide catheter was then retrieved slightly more proximally to obtained left external carotid arteriogram. This revealed almost 85% devascularization of the tumor. The small tributaries arising at the bifurcation origins of the left external carotid artery and the left internal carotid artery were unembolizable because of their teeny size. The guide catheter was then retrieved into the left common carotid artery. A control arteriogram performed through this and centered intracranially demonstrated no change in the intracranial circulation. No evidence of intraluminal filling defects or of occlusions were seen. The 6 [place] MDP guide catheter, and the neurovascular sheath were then retrieved into the abdominal aorta and exchanged over a J-tip guidewire for a 6 French Pinnacle sheath. This in turn was then removed with the successful application of an external closure device.  The right groin appeared soft without evidence of a hematoma or bleeding. Distal pulses remained palpable in the dorsalis pedis and posterior tibial regions bilaterally at the end of the procedure. After the procedure, the patient's ACT was maintained in the region of approximately 180 seconds. No acute hemodynamic or neurological changes were seen during the procedure. The patient's general anesthesia was then reversed and the patient was extubated without difficulty. Upon recovery the patient demonstrated no new neurological signs or symptoms. Of note, the patient's vision was normal and she did not have any difficulty swallowing with no evidence of facial numbness, facial droop on the left side, and the patient's tongue was midline. 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 IMPRESSION: Status post endovascular devascularization of approximately 85% of the prominent hypervascular mass at the left common carotid bifurcation using PVA particles of sizes 350 to 500 microns as described without event. PLAN: Patient transferred to the neuro ICU. Electronically Signed   By: Luanne Bras M.D.   On: 10/27/2017 13:42   Ir Angiogram Follow Up Study  Result Date: 10/30/2017 CLINICAL DATA:  Left neck mass. CT suggestive of carotid body tumor. Pre-surgical embolization. EXAM: TRANSCATHETER THERAPY EMBOLIZATION; LEFT EXTREMITY ARTERIOGRAPHY; BILATERAL COMMON CAROTID AND INNOMINATE ANGIOGRAPHY; IR ANGIO VERTEBRAL SEL VERTEBRAL UNI RIGHT MOD SED; IR ANGIO EXTERNAL CAROTID SEL EXT CAROTID UNI LEFT MOD SED; ARTERIOGRAPHY; IR NEURO EACH ADD'L AFTER BASIC UNI LEFT (MS) COMPARISON:  CT angiogram of the head and neck of 08/15/2017. MEDICATIONS: Heparin 3500 units IV; Ancef 2 g IV antibiotic was administered within 1 hour of the procedure. ANESTHESIA/SEDATION: Mac anesthesia for the diagnostic portion. General anesthesia by the Department of Anesthesiology at [facility] for the intervention treatment. CONTRAST:  Isovue 300 approximately 120 cc. FLUOROSCOPY TIME:  Fluoroscopy Time: 47 minutes 36 seconds (2410. mGy). COMPLICATIONS: None immediate. TECHNIQUE: Informed written consent was obtained from the patient after a thorough discussion of the procedural risks, benefits and alternatives. All questions were addressed. Maximal Sterile Barrier Technique was utilized including caps, mask, sterile gowns, sterile gloves, sterile drape, hand hygiene and skin antiseptic. A timeout was performed prior to the initiation of the procedure. The right groin was prepped and draped in the usual sterile fashion. Thereafter using modified Seldinger technique, transfemoral access into the right common femoral artery was  obtained without difficulty. Over a 0.035 inch guidewire, a 5 French Pinnacle sheath was inserted. Through this, and also over 0.035 inch guidewire, a 5 [place] JB 1 catheter was advanced to the aortic arch region and selectively positioned in the right common carotid artery, , the right vertebral artery, the left common carotid artery, left external carotid artery and the left subclavian artery. FINDINGS: The right common carotid arteriogram demonstrates the right external carotid artery and its major branches to be widely patent. The right internal carotid artery at the bulb to the cranial skull base opacifies normally. The petrous, cavernous and supraclinoid segments demonstrate wide patency. The right middle cerebral artery and the right anterior cerebral artery opacify into the capillary and venous phases. The origin of the right vertebral artery is widely patent. The vessel demonstrates mild tortuosity in its extracranial course. Wide patency is seen of the right vertebrobasilar junction, and the right posterior-inferior cerebellar artery. The opacified portion of the basilar artery, the posterior cerebral arteries, the superior cerebellar arteries and the anterior-inferior cerebellar  arteries is normal into the capillary and venous phases. There is retrograde opacification into the left vertebrobasilar junction to the level of the left posterior-inferior cerebellar artery. There is no antegrade clearance of contrast. The left subclavian arteriogram demonstrates nonvisualization of the left vertebral artery. The ascending cervical branch of the thyrocervical trunk is seen to enter into the cranial skull base without reconstitution of the left vertebral artery. The left common carotid arteriogram demonstrates the left internal carotid artery at the bulb to the cranial skull base to be widely patent. The petrous, the cavernous and the supraclinoid segments demonstrate wide patency. A left posterior communicating  artery is seen opacifying the left posterior cerebral artery distribution. The left middle cerebral artery and the left anterior cerebral artery opacify into the capillary and venous phases. The left external carotid artery and the left internal carotid artery demonstrate splaying of the 2 vessels at their origin. A large prominent homogeneous blush is seen in the splayed portion of the left external and the left internal carotid arteries at their origins. There is a large draining vein emanating from the lateral inferior aspect of this tumor blush. A selective left external carotid arteriogram demonstrates abnormally prominent ascending pharyngeal artery which courses along the superior and lateral aspect of this hyper vascular mass with multiple minute feeders. ENDOVASCULAR TREATMENT OF A PROMINENT LARGE HYPERVASCULAR MASS AT THE LEFT CAROTID BIFURCATION PRIOR TO SURGICAL REMOVAL. The angio findings were reviewed with patient. She was again informed of the intent to reduce the vascularity within the mass prior to her surgery. Questions were answered regarding the procedure. The patient agreed to proceed with the endovascular devascularization of the hypervascular mass in the left carotid bifurcation. The patient was then put under general anesthesia by the Department of Anesthesiology at [facility]. The diagnostic JB 1 catheter in the left common carotid artery was then exchanged over a 0.035 inch 300 cm Rosen exchange guidewire for a 6 French 65 cm neurovascular Arrow sheath using biplane roadmap technique and constant fluoroscopic guidance. Good aspiration obtained from the side port of the hypervascular sheath. A gentle contrast injection demonstrated no evidence of spasms, dissections or of intraluminal filling defects in the left common carotid artery. This was then connected to continuous heparinized saline infusion. Over the [facility] exchange guidewire, a 90 cm MDP Brite tip Envoy guide catheter was  then advanced and positioned in the proximal left external carotid artery. The guidewire was removed. Good aspiration obtained from the hub of 6 [place] Brite tip Envoy guide catheter. A gentle control arteriogram demonstrated again no evidence of spasms, dissections or of intraluminal filling defects. A rapid transit 2 tip microcatheter was then advanced over a 0.014 inch regular Synchro micro guidewire to the distal end of the MDP guide catheter in the left external carotid artery. Using a torque device, the wire was then manipulated to advance into the abnormally prominent ascending pharyngeal branch supplying this hypervascular mass. This was then followed by the microcatheter. However, the microcatheter could not be advanced into the distal 1/3 of the feeder supplying this tumor. This was left at the mid 1/3. The guidewire was removed. Again there was good aspiration of contrast at the hub of the microcatheter. A gentle contrast injection through the microcatheter demonstrated two abnormally prominent inferiorly projecting vessels supplying the main body of the hypervascular mass. In order to prevent inadvertent embolization of any abnormal vasculature distal to these branches, a Target 360 Nano coil 2 mm x 4 cm was advanced  and delivered just distal to the origins of these two main branches supplying the hypovascular mass. Again this was performed under biplane roadmap technique and constant fluoroscopic guidance, in a coaxial manner and with constant heparinized saline infusion. A control arteriogram performed through the microcatheter after delivery of the coil demonstrated near complete occlusion with retrograde opacification into the two main branches supplying the hypervascular mass. With the microcatheter in this position, PVA particles of sizes 350-500 microns were then injected mixed with 75% contrast and 25% heparinized saline infusion. Prior to the injection of the embolic material super selectely, a  gentle control arteriogram was obtained through the microcatheter to ensure no dangerous communications between the external carotid artery and the internal carotid artery and the left vertebral artery. The embolizations are performed under constant native biplane control. Embolization was continued until there was reflux of the tip of the microcatheter. Once there, the microcatheter tip was then gently retrieved slightly more proximally. A control arteriogram performed through the microcatheter demonstrated complete occlusion of the feeders supplying the main body of the hypervascular mass. This microcatheter was then removed. The MDP Envoy guide catheter was then retrieved slightly more proximally to obtained left external carotid arteriogram. This revealed almost 85% devascularization of the tumor. The small tributaries arising at the bifurcation origins of the left external carotid artery and the left internal carotid artery were unembolizable because of their teeny size. The guide catheter was then retrieved into the left common carotid artery. A control arteriogram performed through this and centered intracranially demonstrated no change in the intracranial circulation. No evidence of intraluminal filling defects or of occlusions were seen. The 6 [place] MDP guide catheter, and the neurovascular sheath were then retrieved into the abdominal aorta and exchanged over a J-tip guidewire for a 6 French Pinnacle sheath. This in turn was then removed with the successful application of an external closure device. The right groin appeared soft without evidence of a hematoma or bleeding. Distal pulses remained palpable in the dorsalis pedis and posterior tibial regions bilaterally at the end of the procedure. After the procedure, the patient's ACT was maintained in the region of approximately 180 seconds. No acute hemodynamic or neurological changes were seen during the procedure. The patient's general anesthesia was  then reversed and the patient was extubated without difficulty. Upon recovery the patient demonstrated no new neurological signs or symptoms. Of note, the patient's vision was normal and she did not have any difficulty swallowing with no evidence of facial numbness, facial droop on the left side, and the patient's tongue was midline. 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 IMPRESSION: Status post endovascular devascularization of approximately 85% of the prominent hypervascular mass at the left common carotid bifurcation using PVA particles of sizes 350 to 500 microns as described without event. PLAN: Patient transferred to the neuro ICU. Electronically Signed   By: Luanne Bras M.D.   On: 10/27/2017 13:42   Ir Angio Intra Extracran Sel Com Carotid Innominate Bilat Mod Sed  Result Date: 10/30/2017 CLINICAL DATA:  Left neck mass. CT suggestive of carotid body tumor. Pre-surgical embolization. EXAM: TRANSCATHETER THERAPY EMBOLIZATION; LEFT EXTREMITY ARTERIOGRAPHY; BILATERAL COMMON CAROTID AND INNOMINATE ANGIOGRAPHY; IR ANGIO VERTEBRAL SEL VERTEBRAL UNI RIGHT MOD SED; IR ANGIO EXTERNAL CAROTID SEL EXT CAROTID UNI LEFT MOD SED; ARTERIOGRAPHY; IR NEURO EACH ADD'L AFTER BASIC UNI LEFT (MS) COMPARISON:  CT angiogram of the head and neck of 08/15/2017. MEDICATIONS: Heparin 3500 units IV; Ancef 2 g IV antibiotic was administered within 1 hour of the procedure. ANESTHESIA/SEDATION: Mac anesthesia for the diagnostic portion. General anesthesia by the Department of Anesthesiology at [facility] for the intervention treatment. CONTRAST:  Isovue 300 approximately 120 cc. FLUOROSCOPY TIME:  Fluoroscopy Time: 47 minutes 36 seconds (2410. mGy). COMPLICATIONS: None immediate. TECHNIQUE: Informed written consent was obtained from the patient after a thorough discussion of the  procedural risks, benefits and alternatives. All questions were addressed. Maximal Sterile Barrier Technique was utilized including caps, mask, sterile gowns, sterile gloves, sterile drape, hand hygiene and skin antiseptic. A timeout was performed prior to the initiation of the procedure. The right groin was prepped and draped in the usual sterile fashion. Thereafter using modified Seldinger technique, transfemoral access into the right common femoral artery was obtained without difficulty. Over a 0.035 inch guidewire, a 5 French Pinnacle sheath was inserted. Through this, and also over 0.035 inch guidewire, a 5 [place] JB 1 catheter was advanced to the aortic arch region and selectively positioned in the right common carotid artery, , the right vertebral artery, the left common carotid artery, left external carotid artery and the left subclavian artery. FINDINGS: The right common carotid arteriogram demonstrates the right external carotid artery and its major branches to be widely patent. The right internal carotid artery at the bulb to the cranial skull base opacifies normally. The petrous, cavernous and supraclinoid segments demonstrate wide patency. The right middle cerebral artery and the right anterior cerebral artery opacify into the capillary and venous phases. The origin of the right vertebral artery is widely patent. The vessel demonstrates mild tortuosity in its extracranial course. Wide patency is seen of the right vertebrobasilar junction, and the right posterior-inferior cerebellar artery. The opacified portion of the basilar artery, the posterior cerebral arteries, the superior cerebellar arteries and the anterior-inferior cerebellar arteries is normal into the capillary and venous phases. There is retrograde opacification into the left vertebrobasilar junction to the level of the left posterior-inferior cerebellar artery. There is no antegrade clearance of contrast. The left subclavian arteriogram  demonstrates nonvisualization of the left vertebral artery. The ascending cervical branch of the thyrocervical trunk is seen to enter into the cranial skull base without reconstitution of the left vertebral artery. The left common carotid arteriogram demonstrates the left internal carotid artery at the bulb to the cranial skull base to be widely patent. The petrous, the cavernous and the supraclinoid segments demonstrate wide patency. A left posterior communicating artery is seen opacifying the left posterior cerebral artery distribution. The left middle cerebral artery and the left anterior cerebral artery opacify into the capillary and venous phases. The left external carotid artery and the left internal carotid artery demonstrate splaying of the 2 vessels at their origin. A large prominent homogeneous blush is seen in the splayed portion of the left external and the left internal carotid arteries at their origins. There is a large draining vein emanating from the lateral inferior aspect of this tumor blush. A selective left external carotid arteriogram demonstrates abnormally prominent ascending pharyngeal artery which courses along the superior and lateral aspect of this hyper vascular mass with multiple minute feeders. ENDOVASCULAR TREATMENT OF A PROMINENT LARGE HYPERVASCULAR MASS AT THE LEFT CAROTID BIFURCATION PRIOR TO SURGICAL REMOVAL. The angio findings were reviewed with patient. She was again informed of the intent to reduce the vascularity within the mass prior to her surgery. Questions were  answered regarding the procedure. The patient agreed to proceed with the endovascular devascularization of the hypervascular mass in the left carotid bifurcation. The patient was then put under general anesthesia by the Department of Anesthesiology at [facility]. The diagnostic JB 1 catheter in the left common carotid artery was then exchanged over a 0.035 inch 300 cm Rosen exchange guidewire for a 6 [place]  65 cm neurovascular Arrow sheath using biplane roadmap technique and constant fluoroscopic guidance. Good aspiration obtained from the side port of the hypervascular sheath. A gentle contrast injection demonstrated no evidence of spasms, dissections or of intraluminal filling defects in the left common carotid artery. This was then connected to continuous heparinized saline infusion. Over the [facility] exchange guidewire, a 90 cm MDP Brite tip Envoy guide catheter was then advanced and positioned in the proximal left external carotid artery. The guidewire was removed. Good aspiration obtained from the hub of 6 [place] Brite tip Envoy guide catheter. A gentle control arteriogram demonstrated again no evidence of spasms, dissections or of intraluminal filling defects. A rapid transit 2 tip microcatheter was then advanced over a 0.014 inch regular Synchro micro guidewire to the distal end of the MDP guide catheter in the left external carotid artery. Using a torque device, the wire was then manipulated to advance into the abnormally prominent ascending pharyngeal branch supplying this hypervascular mass. This was then followed by the microcatheter. However, the microcatheter could not be advanced into the distal 1/3 of the feeder supplying this tumor. This was left at the mid 1/3. The guidewire was removed. Again there was good aspiration of contrast at the hub of the microcatheter. A gentle contrast injection through the microcatheter demonstrated two abnormally prominent inferiorly projecting vessels supplying the main body of the hypervascular mass. In order to prevent inadvertent embolization of any abnormal vasculature distal to these branches, a Target 360 Nano coil 2 mm x 4 cm was advanced and delivered just distal to the origins of these two main branches supplying the hypovascular mass. Again this was performed under biplane roadmap technique and constant fluoroscopic guidance, in a coaxial manner and with constant  heparinized saline infusion. A control arteriogram performed through the microcatheter after delivery of the coil demonstrated near complete occlusion with retrograde opacification into the two main branches supplying the hypervascular mass. With the microcatheter in this position, PVA particles of sizes 350-500 microns were then injected mixed with 75% contrast and 25% heparinized saline infusion. Prior to the injection of the embolic material super selectely, a gentle control arteriogram was obtained through the microcatheter to ensure no dangerous communications between the external carotid artery and the internal carotid artery and the left vertebral artery. The embolizations are performed under constant native biplane control. Embolization was continued until there was reflux of the tip of the microcatheter. Once there, the microcatheter tip was then gently retrieved slightly more proximally. A control arteriogram performed through the microcatheter demonstrated complete occlusion of the feeders supplying the main body of the hypervascular mass. This microcatheter was then removed. The MDP Envoy guide catheter was then retrieved slightly more proximally to obtained left external carotid arteriogram. This revealed almost 85% devascularization of the tumor. The small tributaries arising at the bifurcation origins of the left external carotid artery and the left internal carotid artery were unembolizable because of their teeny size. The guide catheter was then retrieved into the left common carotid artery. A control arteriogram performed through this and centered intracranially demonstrated no change in the intracranial  circulation. No evidence of intraluminal filling defects or of occlusions were seen. The 6 [place] MDP guide catheter, and the neurovascular sheath were then retrieved into the abdominal aorta and exchanged over a J-tip guidewire for a 6 French Pinnacle sheath. This in turn was then removed with the  successful application of an external closure device. The right groin appeared soft without evidence of a hematoma or bleeding. Distal pulses remained palpable in the dorsalis pedis and posterior tibial regions bilaterally at the end of the procedure. After the procedure, the patient's ACT was maintained in the region of approximately 180 seconds. No acute hemodynamic or neurological changes were seen during the procedure. The patient's general anesthesia was then reversed and the patient was extubated without difficulty. Upon recovery the patient demonstrated no new neurological signs or symptoms. Of note, the patient's vision was normal and she did not have any difficulty swallowing with no evidence of facial numbness, facial droop on the left side, and the patient's tongue was midline. 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 IMPRESSION: Status post endovascular devascularization of approximately 85% of the prominent hypervascular mass at the left common carotid bifurcation using PVA particles of sizes 350 to 500 microns as described without event. PLAN: Patient transferred to the neuro ICU. Electronically Signed   By: Luanne Bras M.D.   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 Pre-surgical embolization. EXAM: TRANSCATHETER THERAPY EMBOLIZATION; LEFT EXTREMITY ARTERIOGRAPHY; BILATERAL COMMON CAROTID AND INNOMINATE ANGIOGRAPHY; IR ANGIO VERTEBRAL SEL VERTEBRAL UNI RIGHT MOD SED; IR ANGIO EXTERNAL CAROTID SEL EXT CAROTID UNI LEFT MOD SED; ARTERIOGRAPHY; IR NEURO EACH ADD'L AFTER BASIC UNI LEFT (MS) COMPARISON:  CT angiogram of the head and neck of 08/15/2017. MEDICATIONS: Heparin 3500 units IV; Ancef 2 g IV antibiotic was administered within 1  hour of the procedure. ANESTHESIA/SEDATION: Mac anesthesia for the diagnostic portion. General anesthesia by the Department of Anesthesiology at [facility] for the intervention treatment. CONTRAST:  Isovue 300 approximately 120 cc. FLUOROSCOPY TIME:  Fluoroscopy Time: 47 minutes 36 seconds (2410. mGy). COMPLICATIONS: None immediate. TECHNIQUE: Informed written consent was obtained from the patient after a thorough discussion of the procedural risks, benefits and alternatives. All questions were addressed. Maximal Sterile Barrier Technique was utilized including caps, mask, sterile gowns, sterile gloves, sterile drape, hand hygiene and skin antiseptic. A timeout was performed prior to the initiation of the procedure. The right groin was prepped and draped in the usual sterile fashion. Thereafter using modified Seldinger technique, transfemoral access into the right common femoral artery was obtained without difficulty. Over a 0.035 inch guidewire, a 5 French Pinnacle sheath was inserted. Through this, and also over 0.035 inch guidewire, a 5 [place] JB 1 catheter was advanced to the aortic arch region and selectively positioned in the right common carotid artery, , the right vertebral artery, the left common carotid artery, left external carotid artery and the left subclavian artery. FINDINGS: The right common carotid arteriogram demonstrates the right external carotid artery and its major branches to be widely patent. The right internal carotid artery at the bulb to the cranial skull base opacifies normally. The petrous, cavernous and supraclinoid segments demonstrate wide patency. The right middle cerebral artery and the right anterior cerebral artery  opacify into the capillary and venous phases. The origin of the right vertebral artery is widely patent. The vessel demonstrates mild tortuosity in its extracranial course. Wide patency is seen of the right vertebrobasilar junction, and the right  posterior-inferior cerebellar artery. The opacified portion of the basilar artery, the posterior cerebral arteries, the superior cerebellar arteries and the anterior-inferior cerebellar arteries is normal into the capillary and venous phases. There is retrograde opacification into the left vertebrobasilar junction to the level of the left posterior-inferior cerebellar artery. There is no antegrade clearance of contrast. The left subclavian arteriogram demonstrates nonvisualization of the left vertebral artery. The ascending cervical branch of the thyrocervical trunk is seen to enter into the cranial skull base without reconstitution of the left vertebral artery. The left common carotid arteriogram demonstrates the left internal carotid artery at the bulb to the cranial skull base to be widely patent. The petrous, the cavernous and the supraclinoid segments demonstrate wide patency. A left posterior communicating artery is seen opacifying the left posterior cerebral artery distribution. The left middle cerebral artery and the left anterior cerebral artery opacify into the capillary and venous phases. The left external carotid artery and the left internal carotid artery demonstrate splaying of the 2 vessels at their origin. A large prominent homogeneous blush is seen in the splayed portion of the left external and the left internal carotid arteries at their origins. There is a large draining vein emanating from the lateral inferior aspect of this tumor blush. A selective left external carotid arteriogram demonstrates abnormally prominent ascending pharyngeal artery which courses along the superior and lateral aspect of this hyper vascular mass with multiple minute feeders. ENDOVASCULAR TREATMENT OF A PROMINENT LARGE HYPERVASCULAR MASS AT THE LEFT CAROTID BIFURCATION PRIOR TO SURGICAL REMOVAL. The angio findings were reviewed with patient. She was again informed of the intent to reduce the vascularity within the mass  prior to her surgery. Questions were answered regarding the procedure. The patient agreed to proceed with the endovascular devascularization of the hypervascular mass in the left carotid bifurcation. The patient was then put under general anesthesia by the Department of Anesthesiology at [facility]. The diagnostic JB 1 catheter in the left common carotid artery was then exchanged over a 0.035 inch 300 cm Rosen exchange guidewire for a 6 French 65 cm neurovascular Arrow sheath using biplane roadmap technique and constant fluoroscopic guidance. Good aspiration obtained from the side port of the hypervascular sheath. A gentle contrast injection demonstrated no evidence of spasms, dissections or of intraluminal filling defects in the left common carotid artery. This was then connected to continuous heparinized saline infusion. Over the [facility] exchange guidewire, a 90 cm MDP Brite tip Envoy guide catheter was then advanced and positioned in the proximal left external carotid artery. The guidewire was removed. Good aspiration obtained from the hub of 6 [place] Brite tip Envoy guide catheter. A gentle control arteriogram demonstrated again no evidence of spasms, dissections or of intraluminal filling defects. A rapid transit 2 tip microcatheter was then advanced over a 0.014 inch regular Synchro micro guidewire to the distal end of the MDP guide catheter in the left external carotid artery. Using a torque device, the wire was then manipulated to advance into the abnormally prominent ascending pharyngeal branch supplying this hypervascular mass. This was then followed by the microcatheter. However, the microcatheter could not be advanced into the distal 1/3 of the feeder supplying this tumor. This was left at the mid 1/3. The guidewire was removed. Again  there was good aspiration of contrast at the hub of the microcatheter. A gentle contrast injection through the microcatheter demonstrated two abnormally prominent  inferiorly projecting vessels supplying the main body of the hypervascular mass. In order to prevent inadvertent embolization of any abnormal vasculature distal to these branches, a Target 360 Nano coil 2 mm x 4 cm was advanced and delivered just distal to the origins of these two main branches supplying the hypovascular mass. Again this was performed under biplane roadmap technique and constant fluoroscopic guidance, in a coaxial manner and with constant heparinized saline infusion. A control arteriogram performed through the microcatheter after delivery of the coil demonstrated near complete occlusion with retrograde opacification into the two main branches supplying the hypervascular mass. With the microcatheter in this position, PVA particles of sizes 350-500 microns were then injected mixed with 75% contrast and 25% heparinized saline infusion. Prior to the injection of the embolic material super selectely, a gentle control arteriogram was obtained through the microcatheter to ensure no dangerous communications between the external carotid artery and the internal carotid artery and the left vertebral artery. The embolizations are performed under constant native biplane control. Embolization was continued until there was reflux of the tip of the microcatheter. Once there, the microcatheter tip was then gently retrieved slightly more proximally. A control arteriogram performed through the microcatheter demonstrated complete occlusion of the feeders supplying the main body of the hypervascular mass. This microcatheter was then removed. The MDP Envoy guide catheter was then retrieved slightly more proximally to obtained left external carotid arteriogram. This revealed almost 85% devascularization of the tumor. The small tributaries arising at the bifurcation origins of the left external carotid artery and the left internal carotid artery were unembolizable because of their teeny size. The guide catheter was then  retrieved into the left common carotid artery. A control arteriogram performed through this and centered intracranially demonstrated no change in the intracranial circulation. No evidence of intraluminal filling defects or of occlusions were seen. The 6 [place] MDP guide catheter, and the neurovascular sheath were then retrieved into the abdominal aorta and exchanged over a J-tip guidewire for a 6 French Pinnacle sheath. This in turn was then removed with the successful application of an external closure device. The right groin appeared soft without evidence of a hematoma or bleeding. Distal pulses remained palpable in the dorsalis pedis and posterior tibial regions bilaterally at the end of the procedure. After the procedure, the patient's ACT was maintained in the region of approximately 180 seconds. No acute hemodynamic or neurological changes were seen during the procedure. The patient's general anesthesia was then reversed and the patient was extubated without difficulty. Upon recovery the patient demonstrated no new neurological signs or symptoms. Of note, the patient's vision was normal and she did not have any difficulty swallowing with no evidence of facial numbness, facial droop on the left side, and the patient's tongue was midline. 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 IMPRESSION: Status post endovascular devascularization of approximately 85% of the prominent hypervascular mass at the left common carotid bifurcation using PVA particles of sizes 350 to 500 microns as described without event. PLAN: Patient transferred to the neuro ICU. Electronically Signed   By: Luanne Bras M.D.   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  Pre-surgical embolization. EXAM: TRANSCATHETER THERAPY EMBOLIZATION; LEFT EXTREMITY ARTERIOGRAPHY; BILATERAL COMMON CAROTID AND INNOMINATE ANGIOGRAPHY; IR ANGIO VERTEBRAL SEL VERTEBRAL UNI RIGHT MOD SED; IR ANGIO EXTERNAL CAROTID SEL EXT CAROTID UNI LEFT MOD SED; ARTERIOGRAPHY; IR NEURO EACH ADD'L AFTER BASIC UNI LEFT (MS) COMPARISON:  CT angiogram of the head and neck of 08/15/2017. MEDICATIONS: Heparin 3500 units IV; Ancef 2 g IV antibiotic was administered within 1 hour of the procedure. ANESTHESIA/SEDATION: Mac anesthesia for the diagnostic portion. General anesthesia by the Department of Anesthesiology at [facility] for the intervention treatment. CONTRAST:  Isovue 300 approximately 120 cc. FLUOROSCOPY TIME:  Fluoroscopy Time: 47 minutes 36 seconds (2410. mGy). COMPLICATIONS: None immediate. TECHNIQUE: Informed written consent was obtained from the patient after a thorough discussion of the procedural risks, benefits and alternatives. All questions were addressed. Maximal Sterile Barrier Technique was utilized including caps, mask, sterile gowns, sterile gloves, sterile drape, hand hygiene and skin antiseptic. A timeout was performed prior to the initiation of the procedure. The right groin was prepped and draped in the usual sterile fashion. Thereafter using modified Seldinger technique, transfemoral access into the right common femoral artery was obtained without difficulty. Over a 0.035 inch guidewire, a 5 French Pinnacle sheath was inserted. Through this, and also over 0.035 inch guidewire, a 5 [place] JB 1 catheter was advanced to the aortic arch region and selectively positioned in the right common carotid artery, , the right vertebral artery, the left common carotid artery, left external carotid artery and the left subclavian artery. FINDINGS: The right common carotid arteriogram demonstrates the right external carotid artery and its major branches to be widely patent. The right internal carotid  artery at the bulb to the cranial skull base opacifies normally. The petrous, cavernous and supraclinoid segments demonstrate wide patency. The right middle cerebral artery and the right anterior cerebral artery opacify into the capillary and venous phases. The origin of the right vertebral artery is widely patent. The vessel demonstrates mild tortuosity in its extracranial course. Wide patency is seen of the right vertebrobasilar junction, and the right posterior-inferior cerebellar artery. The opacified portion of the basilar artery, the posterior cerebral arteries, the superior cerebellar arteries and the anterior-inferior cerebellar arteries is normal into the capillary and venous phases. There is retrograde opacification into the left vertebrobasilar junction to the level of the left posterior-inferior cerebellar artery. There is no antegrade clearance of contrast. The left subclavian arteriogram demonstrates nonvisualization of the left vertebral artery. The ascending cervical branch of the thyrocervical trunk is seen to enter into the cranial skull base without reconstitution of the left vertebral artery. The left common carotid arteriogram demonstrates the left internal carotid artery at the bulb to the cranial skull base to be widely patent. The petrous, the cavernous and the supraclinoid segments demonstrate wide patency. A left posterior communicating artery is seen opacifying the left posterior cerebral artery distribution. The left middle cerebral artery and the left anterior cerebral artery opacify into the capillary and venous phases. The left external carotid artery and the left internal carotid artery demonstrate splaying of the 2 vessels at their origin. A large prominent homogeneous blush is seen in the splayed portion of the left external and the left internal carotid arteries at their origins. There is a large draining vein emanating from the lateral inferior aspect of this tumor blush. A  selective left external carotid arteriogram demonstrates abnormally prominent ascending pharyngeal artery  which courses along the superior and lateral aspect of this hyper vascular mass with multiple minute feeders. ENDOVASCULAR TREATMENT OF A PROMINENT LARGE HYPERVASCULAR MASS AT THE LEFT CAROTID BIFURCATION PRIOR TO SURGICAL REMOVAL. The angio findings were reviewed with patient. She was again informed of the intent to reduce the vascularity within the mass prior to her surgery. Questions were answered regarding the procedure. The patient agreed to proceed with the endovascular devascularization of the hypervascular mass in the left carotid bifurcation. The patient was then put under general anesthesia by the Department of Anesthesiology at [facility]. The diagnostic JB 1 catheter in the left common carotid artery was then exchanged over a 0.035 inch 300 cm Rosen exchange guidewire for a 6 French 65 cm neurovascular Arrow sheath using biplane roadmap technique and constant fluoroscopic guidance. Good aspiration obtained from the side port of the hypervascular sheath. A gentle contrast injection demonstrated no evidence of spasms, dissections or of intraluminal filling defects in the left common carotid artery. This was then connected to continuous heparinized saline infusion. Over the [facility] exchange guidewire, a 90 cm MDP Brite tip Envoy guide catheter was then advanced and positioned in the proximal left external carotid artery. The guidewire was removed. Good aspiration obtained from the hub of 6 [place] Brite tip Envoy guide catheter. A gentle control arteriogram demonstrated again no evidence of spasms, dissections or of intraluminal filling defects. A rapid transit 2 tip microcatheter was then advanced over a 0.014 inch regular Synchro micro guidewire to the distal end of the MDP guide catheter in the left external carotid artery. Using a torque device, the wire was then manipulated to advance into  the abnormally prominent ascending pharyngeal branch supplying this hypervascular mass. This was then followed by the microcatheter. However, the microcatheter could not be advanced into the distal 1/3 of the feeder supplying this tumor. This was left at the mid 1/3. The guidewire was removed. Again there was good aspiration of contrast at the hub of the microcatheter. A gentle contrast injection through the microcatheter demonstrated two abnormally prominent inferiorly projecting vessels supplying the main body of the hypervascular mass. In order to prevent inadvertent embolization of any abnormal vasculature distal to these branches, a Target 360 Nano coil 2 mm x 4 cm was advanced and delivered just distal to the origins of these two main branches supplying the hypovascular mass. Again this was performed under biplane roadmap technique and constant fluoroscopic guidance, in a coaxial manner and with constant heparinized saline infusion. A control arteriogram performed through the microcatheter after delivery of the coil demonstrated near complete occlusion with retrograde opacification into the two main branches supplying the hypervascular mass. With the microcatheter in this position, PVA particles of sizes 350-500 microns were then injected mixed with 75% contrast and 25% heparinized saline infusion. Prior to the injection of the embolic material super selectely, a gentle control arteriogram was obtained through the microcatheter to ensure no dangerous communications between the external carotid artery and the internal carotid artery and the left vertebral artery. The embolizations are performed under constant native biplane control. Embolization was continued until there was reflux of the tip of the microcatheter. Once there, the microcatheter tip was then gently retrieved slightly more proximally. A control arteriogram performed through the microcatheter demonstrated complete occlusion of the feeders supplying  the main body of the hypervascular mass. This microcatheter was then removed. The MDP Envoy guide catheter was then retrieved slightly more proximally to obtained left external carotid arteriogram. This  revealed almost 85% devascularization of the tumor. The small tributaries arising at the bifurcation origins of the left external carotid artery and the left internal carotid artery were unembolizable because of their teeny size. The guide catheter was then retrieved into the left common carotid artery. A control arteriogram performed through this and centered intracranially demonstrated no change in the intracranial circulation. No evidence of intraluminal filling defects or of occlusions were seen. The 6 [place] MDP guide catheter, and the neurovascular sheath were then retrieved into the abdominal aorta and exchanged over a J-tip guidewire for a 6 French Pinnacle sheath. This in turn was then removed with the successful application of an external closure device. The right groin appeared soft without evidence of a hematoma or bleeding. Distal pulses remained palpable in the dorsalis pedis and posterior tibial regions bilaterally at the end of the procedure. After the procedure, the patient's ACT was maintained in the region of approximately 180 seconds. No acute hemodynamic or neurological changes were seen during the procedure. The patient's general anesthesia was then reversed and the patient was extubated without difficulty. Upon recovery the patient demonstrated no new neurological signs or symptoms. Of note, the patient's vision was normal and she did not have any difficulty swallowing with no evidence of facial numbness, facial droop on the left side, and the patient's tongue was midline. 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 IMPRESSION: Status post endovascular  devascularization of approximately 85% of the prominent hypervascular mass at the left common carotid bifurcation using PVA particles of sizes 350 to 500 microns as described without event. PLAN: Patient transferred to the neuro ICU. Electronically Signed   By: Luanne Bras M.D.   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 Pre-surgical embolization. EXAM: TRANSCATHETER THERAPY EMBOLIZATION; LEFT EXTREMITY ARTERIOGRAPHY; BILATERAL COMMON CAROTID AND INNOMINATE ANGIOGRAPHY; IR ANGIO VERTEBRAL SEL VERTEBRAL UNI RIGHT MOD SED; IR ANGIO EXTERNAL CAROTID SEL EXT CAROTID UNI LEFT MOD SED; ARTERIOGRAPHY; IR NEURO EACH ADD'L AFTER BASIC UNI LEFT (MS) COMPARISON:  CT angiogram of the head and neck of 08/15/2017. MEDICATIONS: Heparin 3500 units IV; Ancef 2 g IV antibiotic was administered within 1 hour of the procedure. ANESTHESIA/SEDATION: Mac anesthesia for the diagnostic portion. General anesthesia by the Department of Anesthesiology at [facility] for the intervention treatment. CONTRAST:  Isovue 300 approximately 120 cc. FLUOROSCOPY TIME:  Fluoroscopy Time: 47 minutes 36 seconds (2410. mGy). COMPLICATIONS: None immediate. TECHNIQUE: Informed written consent was obtained from the patient after a thorough discussion of the procedural risks, benefits and alternatives. All questions were addressed. Maximal Sterile Barrier Technique was utilized including caps, mask, sterile gowns, sterile gloves, sterile drape, hand hygiene and skin antiseptic. A timeout was performed prior to the initiation of the procedure. The right groin was prepped and draped in the usual sterile fashion. Thereafter using modified Seldinger technique, transfemoral access into the right common femoral artery was obtained without difficulty. Over a 0.035 inch guidewire, a 5 French Pinnacle sheath was inserted. Through  this, and also over 0.035 inch guidewire, a 5 [place] JB 1 catheter was advanced to the aortic arch region and selectively positioned in the right common carotid artery, , the right vertebral artery, the left common carotid artery, left external carotid  artery and the left subclavian artery. FINDINGS: The right common carotid arteriogram demonstrates the right external carotid artery and its major branches to be widely patent. The right internal carotid artery at the bulb to the cranial skull base opacifies normally. The petrous, cavernous and supraclinoid segments demonstrate wide patency. The right middle cerebral artery and the right anterior cerebral artery opacify into the capillary and venous phases. The origin of the right vertebral artery is widely patent. The vessel demonstrates mild tortuosity in its extracranial course. Wide patency is seen of the right vertebrobasilar junction, and the right posterior-inferior cerebellar artery. The opacified portion of the basilar artery, the posterior cerebral arteries, the superior cerebellar arteries and the anterior-inferior cerebellar arteries is normal into the capillary and venous phases. There is retrograde opacification into the left vertebrobasilar junction to the level of the left posterior-inferior cerebellar artery. There is no antegrade clearance of contrast. The left subclavian arteriogram demonstrates nonvisualization of the left vertebral artery. The ascending cervical branch of the thyrocervical trunk is seen to enter into the cranial skull base without reconstitution of the left vertebral artery. The left common carotid arteriogram demonstrates the left internal carotid artery at the bulb to the cranial skull base to be widely patent. The petrous, the cavernous and the supraclinoid segments demonstrate wide patency. A left posterior communicating artery is seen opacifying the left posterior cerebral artery distribution. The left middle cerebral artery  and the left anterior cerebral artery opacify into the capillary and venous phases. The left external carotid artery and the left internal carotid artery demonstrate splaying of the 2 vessels at their origin. A large prominent homogeneous blush is seen in the splayed portion of the left external and the left internal carotid arteries at their origins. There is a large draining vein emanating from the lateral inferior aspect of this tumor blush. A selective left external carotid arteriogram demonstrates abnormally prominent ascending pharyngeal artery which courses along the superior and lateral aspect of this hyper vascular mass with multiple minute feeders. ENDOVASCULAR TREATMENT OF A PROMINENT LARGE HYPERVASCULAR MASS AT THE LEFT CAROTID BIFURCATION PRIOR TO SURGICAL REMOVAL. The angio findings were reviewed with patient. She was again informed of the intent to reduce the vascularity within the mass prior to her surgery. Questions were answered regarding the procedure. The patient agreed to proceed with the endovascular devascularization of the hypervascular mass in the left carotid bifurcation. The patient was then put under general anesthesia by the Department of Anesthesiology at [facility]. The diagnostic JB 1 catheter in the left common carotid artery was then exchanged over a 0.035 inch 300 cm Rosen exchange guidewire for a 6 French 65 cm neurovascular Arrow sheath using biplane roadmap technique and constant fluoroscopic guidance. Good aspiration obtained from the side port of the hypervascular sheath. A gentle contrast injection demonstrated no evidence of spasms, dissections or of intraluminal filling defects in the left common carotid artery. This was then connected to continuous heparinized saline infusion. Over the [facility] exchange guidewire, a 90 cm MDP Brite tip Envoy guide catheter was then advanced and positioned in the proximal left external carotid artery. The guidewire was removed. Good  aspiration obtained from the hub of 6 [place] Brite tip Envoy guide catheter. A gentle control arteriogram demonstrated again no evidence of spasms, dissections or of intraluminal filling defects. A rapid transit 2 tip microcatheter was then advanced over a 0.014 inch regular Synchro micro guidewire to the distal end of the MDP guide catheter in the left  external carotid artery. Using a torque device, the wire was then manipulated to advance into the abnormally prominent ascending pharyngeal branch supplying this hypervascular mass. This was then followed by the microcatheter. However, the microcatheter could not be advanced into the distal 1/3 of the feeder supplying this tumor. This was left at the mid 1/3. The guidewire was removed. Again there was good aspiration of contrast at the hub of the microcatheter. A gentle contrast injection through the microcatheter demonstrated two abnormally prominent inferiorly projecting vessels supplying the main body of the hypervascular mass. In order to prevent inadvertent embolization of any abnormal vasculature distal to these branches, a Target 360 Nano coil 2 mm x 4 cm was advanced and delivered just distal to the origins of these two main branches supplying the hypovascular mass. Again this was performed under biplane roadmap technique and constant fluoroscopic guidance, in a coaxial manner and with constant heparinized saline infusion. A control arteriogram performed through the microcatheter after delivery of the coil demonstrated near complete occlusion with retrograde opacification into the two main branches supplying the hypervascular mass. With the microcatheter in this position, PVA particles of sizes 350-500 microns were then injected mixed with 75% contrast and 25% heparinized saline infusion. Prior to the injection of the embolic material super selectely, a gentle control arteriogram was obtained through the microcatheter to ensure no dangerous communications  between the external carotid artery and the internal carotid artery and the left vertebral artery. The embolizations are performed under constant native biplane control. Embolization was continued until there was reflux of the tip of the microcatheter. Once there, the microcatheter tip was then gently retrieved slightly more proximally. A control arteriogram performed through the microcatheter demonstrated complete occlusion of the feeders supplying the main body of the hypervascular mass. This microcatheter was then removed. The MDP Envoy guide catheter was then retrieved slightly more proximally to obtained left external carotid arteriogram. This revealed almost 85% devascularization of the tumor. The small tributaries arising at the bifurcation origins of the left external carotid artery and the left internal carotid artery were unembolizable because of their teeny size. The guide catheter was then retrieved into the left common carotid artery. A control arteriogram performed through this and centered intracranially demonstrated no change in the intracranial circulation. No evidence of intraluminal filling defects or of occlusions were seen. The 6 [place] MDP guide catheter, and the neurovascular sheath were then retrieved into the abdominal aorta and exchanged over a J-tip guidewire for a 6 French Pinnacle sheath. This in turn was then removed with the successful application of an external closure device. The right groin appeared soft without evidence of a hematoma or bleeding. Distal pulses remained palpable in the dorsalis pedis and posterior tibial regions bilaterally at the end of the procedure. After the procedure, the patient's ACT was maintained in the region of approximately 180 seconds. No acute hemodynamic or neurological changes were seen during the procedure. The patient's general anesthesia was then reversed and the patient was extubated without difficulty. Upon recovery the patient demonstrated no  new neurological signs or symptoms. Of note, the patient's vision was normal and she did not have any difficulty swallowing with no evidence of facial numbness, facial droop on the left side, and the patient's tongue was midline. 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 IMPRESSION: Status post endovascular devascularization of approximately 85%  of the prominent hypervascular mass at the left common carotid bifurcation using PVA particles of sizes 350 to 500 microns as described without event. PLAN: Patient transferred to the neuro ICU. Electronically Signed   By: Luanne Bras M.D.   On: 10/27/2017 13:42       IMPRESSION/PLAN: 1. Paraganglioma of the left carotid artery. Dr. Lisbeth Renshaw discusses the pathology findings and reviews the nature of these benign findings. Her surgery appears to have accomplished complete excision, and we did speak with Dr. Lyndon Code who confirmed the benign features and indolent findings histologically. He was not concerned with any high risk features and stated no concerns with her margins. We discussed the risks, benefits, short, and long term effects of radiotherapy in general, however in this particular case, Dr. Lisbeth Renshaw does not see a role for adjuvant therapy. We would be happy to see her again in the future if radiotherapy was indicated. 2. Limited range of motion. I offered the patient a physical therapy referral in the event of neck stiffness. She declines at this time.  In a visit lasting 30 minutes, greater than 50% of the time was spent face to face discussing her case, and coordinating the patient's care.  The above documentation reflects my direct findings during this shared patient visit. Please see the separate note by Dr. Lisbeth Renshaw on this date for the remainder of the patient's plan of care.    Carola Rhine, PAC This  document serves as a record of services personally performed by Kyung Rudd, MD and Shona Simpson, PA-C. It was created on their behalf by Bethann Humble, a trained medical scribe. The creation of this record is based on the scribe's personal observations and the provider's statements to them. This document has been checked and approved by the attending provider.

## 2017-11-16 NOTE — Progress Notes (Signed)
Diagnosis No diagnosis found.  Staging Cancer Staging No matching staging information was found for the patient.  Assessment and Plan:  1.  Paraganglioma.  Left carotid body tumor incidentally found on CT neck. The patient had a carotid body tumor, also known as paragangliomas, which are rare neuro endocrine tumors.  Most paragangliomas appears to be benign but over time about 15-35% can display malignant behavior.  The patient was seen by vascular surgery and had biopsy of the area done on 10/27/2017 with pathology returning as paraganglioma measuring 1.3 x 0.9 x 0.6 cm.  She reports she is doing well since surgery.   Dr. Talbert Cage discussed with the patient that paragangliomas, are rare neuro endocrine tumors.  Most paragangliomas appears to be benign but over time about 15-35% can display malignant behavior.    She underwent evaluation for catecholamine hypersecretion with 24 hour urine catecholamines and metanephrines that was negative.   I have discussed the case with Dr. Lisbeth Renshaw and we will refer the patient to radiation oncology for evaluation.  She will return to clinic in 6 weeks for follow-up pending radiation evaluation.  Interval History: 76 year old female initially seen by Dr. Talbert Cage for left carotid body tumor.  She was seen by vascular surgery and had a biopsy of the of the area on 10/27/2017 with pathology returned as a paraganglioma measuring 1.3 x 0.9 x 0.6 cm.  Current status:  The patient was seen by vascular surgery and had biopsy of the area done on 10/27/2017 with pathology returning as paraganglioma measuring 1.3 x 0.9 x 0.6 cm.  She reports she is doing well since surgery. Patient is seen today for follow-up.  She is here today to go over her pathology.    Problem List Patient Active Problem List   Diagnosis Date Noted  . Carotid body tumor ([place]) [D44.6] 10/26/2017  . Paraganglioma ([place]) [D44.7] 08/21/2017  . Encounter for screening colonoscopy [Z12.11] 12/14/2015  . Rectal  bleeding [K62.5] 11/26/2015  . Constipation [K59.00] 11/26/2015  . Anemia, iron deficiency [D50.9] 02/03/2013  . Vitamin B12 deficiency [E53.8] 02/03/2013  . Essential hypertension, benign [I10] 02/02/2013  . Anemia [D64.9] 02/02/2013  . Dehydration [E86.0] 02/02/2013  . Unspecified hypothyroidism [E03.9] 02/02/2013  . Pyelonephritis [N12] 02/01/2013    Past Medical History Past Medical History:  Diagnosis Date  . Anemia   . Carotid body tumor ([place])    left  . Hypertension   . Hypothyroid   . Pyelonephritis   . Sleep apnea    Stop Bang score of 4  . Vertigo   . Vitamin B 12 deficiency     Past Surgical History Past Surgical History:  Procedure Laterality Date  . ABDOMINAL HYSTERECTOMY    . CATARACT EXTRACTION W/ INTRAOCULAR LENS  IMPLANT, BILATERAL    . CHOLECYSTECTOMY N/A 04/15/2015   Procedure: LAPAROSCOPIC CHOLECYSTECTOMY;  Surgeon: Aviva Signs, MD;  Location: [facility];  Service: General;  Laterality: N/A;  . COLONOSCOPY  2012   [place]  . COLONOSCOPY WITH PROPOFOL N/A 02/03/2016   Procedure: COLONOSCOPY WITH PROPOFOL;  Surgeon: Robert Bellow, MD;  Location[facility] END[facility]COPY;  Service: Endoscopy;  Laterality: N/A;  . IR ANGIO EXTERNAL CAROTID SEL EXT CAROTID UNI L MOD SED  10/26/2017  . IR ANGIO INTRA EXTRACRAN SEL COM CAROTID INNOMINATE BILAT MOD SED  10/26/2017  . IR ANGIO VERTEBRAL SEL VERTEBRAL UNI R MOD SED  10/26/2017  . IR ANGIOGRAM EXTREMITY LEFT  10/26/2017  . IR ANGIOGRAM FOLLOW UP STUDY  10/26/2017  .  IR NEURO EACH ADD'L AFTER BASIC UNI LEFT (MS)  10/26/2017  . IR RADIOLOGIST EVAL & MGMT  09/21/2017  . IR TRANSCATH/EMBOLIZ  10/26/2017  . MASS EXCISION Left 10/27/2017   Procedure: EXCISION CAROTID BODY TUMOR;  Surgeon: Melida Quitter, MD;  Location: [place];  Service: ENT;  Laterality: Left;  . RADIOLOGY WITH ANESTHESIA N/A 10/26/2017   Procedure: EMBOLIZATION;  Surgeon: Luanne Bras, MD;  Location: [place];  Service: Radiology;  Laterality: N/A;  . UPPER GASTROINTESTINAL  ENDOSCOPY  2014  . VESICOVAGINAL FISTULA CLOSURE W/ TAH      Family History Family History  Problem Relation Age of Onset  . Asthma Mother   . Hypertension Daughter   . Cancer Neg Hx      Social History  reports that  has never smoked. she has never used smokeless tobacco. She reports that she does not drink alcohol or use drugs.  Medications  Current Outpatient Medications:  .  cholecalciferol (VITAMIN D) 1000 UNITS tablet, Take 1,000 Units by mouth daily., Disp: , Rfl:  .  ferrous fumarate (HEMOCYTE - 106 MG FE) 325 (106 FE) MG TABS, Take 1 tablet (106 mg of iron total) by mouth daily., Disp: 30 each, Rfl: 0 .  HYDROcodone-acetaminophen (NORCO/VICODIN) 5-325 MG tablet, Take 1-2 tablets by mouth every 6 (six) hours as needed for moderate pain. (Patient not taking: Reported on 11/14/2017), Disp: 20 tablet, Rfl: 0 .  levothyroxine (SYNTHROID, LEVOTHROID) 88 MCG tablet, Take 88 mcg by mouth daily before breakfast. , Disp: , Rfl:  .  linaclotide (LINZESS) 145 MCG CAPS capsule, Take 145 mcg by mouth daily as needed (constipation)., Disp: , Rfl:  .  naproxen sodium (ALEVE) 220 MG tablet, Take 220 mg by mouth daily as needed (headaches)., Disp: , Rfl:  .  olmesartan-hydrochlorothiazide (BENICAR HCT) 40-12.5 MG per tablet, Take 1 tablet by mouth daily., Disp: , Rfl:  .  promethazine (PHENERGAN) 12.5 MG tablet, Take 1 tablet (12.5 mg total) by mouth every 6 (six) hours as needed for nausea or vomiting. (Patient not taking: Reported on 11/14/2017), Disp: 10 tablet, Rfl: 0 .  vitamin B-12 (CYANOCOBALAMIN) 100 MCG tablet, Take 100 mcg by mouth daily., Disp: , Rfl:   Allergies Codeine  Review of Systems Review of Systems - Oncology ROS as per HPI otherwise 12 point ROS is negative.   Physical Exam  Vitals Wt Readings from Last 3 Encounters:  11/14/17 154 lb 9.6 oz (70.1 kg)  11/03/17 155 lb (70.3 kg)  10/27/17 155 lb (70.3 kg)   Temp Readings from Last 3 Encounters:  11/14/17 97.7 F  (36.5 C) (Oral)  11/03/17 98 F (36.7 C) (Oral)  10/28/17 99.4 F (37.4 C) (Oral)   BP Readings from Last 3 Encounters:  11/14/17 (!) 160/74  11/03/17 (!) 144/82  10/28/17 129/70   Pulse Readings from Last 3 Encounters:  11/14/17 62  11/03/17 64  10/28/17 64    Constitutional: Well-developed, well-nourished, and in no distress.   HENT: Head: Normocephalic and atraumatic.  Evidence of surgery. Mouth/Throat: No oropharyngeal exudate. Mucosa moist. Eyes: Pupils are equal, round, and reactive to light. Conjunctivae are normal. No scleral icterus.  Neck: Normal range of motion. Neck supple. No JVD present.  Cardiovascular: Normal rate, regular rhythm and normal heart sounds.  Exam reveals no gallop and no friction rub.   No murmur heard. Pulmonary/Chest: Effort normal and breath sounds normal. No respiratory distress. No wheezes.No rales.  Abdominal: Soft. Bowel sounds are normal. No distension. There is  no tenderness. There is no guarding.  Musculoskeletal: No edema or tenderness.  Lymphadenopathy: No cervical or supraclavicular adenopathy.   Neurological: Alert and oriented to person, place, and time. No cranial nerve deficit.  Skin: Skin is warm and dry. No rash noted. No erythema. No pallor.  Psychiatric: Affect and judgment normal.   Labs No visits with results within 3 Day(s) from this visit.  Latest known visit with results is:  Admission on 10/26/2017, Discharged on 10/28/2017  Component Date Value Ref Range Status  . aPTT 10/26/2017 40* 24 - 36 seconds Final   Comment:        IF BASELINE aPTT IS ELEVATED, SUGGEST PATIENT RISK ASSESSMENT BE USED TO DETERMINE APPROPRIATE ANTICOAGULANT THERAPY. Performed at [facility], [place] [address], [place], [place] [address]   . WBC 10/26/2017 5.0  4.0 - 10.5 K/uL Final  . RBC 10/26/2017 4.14  3.87 - 5.11 MIL/uL Final  . Hemoglobin 10/26/2017 11.7* 12.0 - 15.0 g/dL Final  . HCT 10/26/2017 36.5  36.0 - 46.0 % Final  . MCV  10/26/2017 88.2  78.0 - 100.0 fL Final  . MCH 10/26/2017 28.3  26.0 - 34.0 pg Final  . MCHC 10/26/2017 32.1  30.0 - 36.0 g/dL Final  . RDW 10/26/2017 13.9  11.5 - 15.5 % Final  . Platelets 10/26/2017 185  150 - 400 K/uL Final  . Neutrophils Relative % 10/26/2017 42  % Final  . Neutro Abs 10/26/2017 2.1  1.7 - 7.7 K/uL Final  . Lymphocytes Relative 10/26/2017 45  % Final  . Lymphs Abs 10/26/2017 2.2  0.7 - 4.0 K/uL Final  . Monocytes Relative 10/26/2017 9  % Final  . Monocytes Absolute 10/26/2017 0.4  0.1 - 1.0 K/uL Final  . Eosinophils Relative 10/26/2017 4  % Final  . Eosinophils Absolute 10/26/2017 0.2  0.0 - 0.7 K/uL Final  . Basophils Relative 10/26/2017 0  % Final  . Basophils Absolute 10/26/2017 0.0  0.0 - 0.1 K/uL Final   Performed at [facility], [place] [address] [place], [place] [address]  . Prothrombin Time 10/26/2017 14.3  11.4 - 15.2 seconds Final  . INR 10/26/2017 1.11   Final   Performed at [facility], [place] [address], [place], [place] [address]  . Sodium 10/26/2017 140  135 - 145 mmol/L Final  . Potassium 10/26/2017 3.3* 3.5 - 5.1 mmol/L Final  . Chloride 10/26/2017 104  101 - 111 mmol/L Final  . CO2 10/26/2017 24  22 - 32 mmol/L Final  . Glucose, Bld 10/26/2017 111* 65 - 99 mg/dL Final  . BUN 10/26/2017 15  6 - 20 mg/dL Final  . Creatinine, Ser 10/26/2017 1.21* 0.44 - 1.00 mg/dL Final  . Calcium 10/26/2017 10.0  8.9 - 10.3 mg/dL Final  . Total Protein 10/26/2017 6.7  6.5 - 8.1 g/dL Final  . Albumin 10/26/2017 3.8  3.5 - 5.0 g/dL Final  . AST 10/26/2017 22  15 - 41 U/L Final  . ALT 10/26/2017 13* 14 - 54 U/L Final  . Alkaline Phosphatase 10/26/2017 63  38 - 126 U/L Final  . Total Bilirubin 10/26/2017 0.6  0.3 - 1.2 mg/dL Final  . GFR calc non Af Amer 10/26/2017 42* >60 mL/min Final  . GFR calc Af Amer 10/26/2017 49* >60 mL/min Final   Comment: (NOTE) The eGFR has been calculated using the CKD EPI equation. This calculation has not been validated in all  clinical situations. eGFR's persistently <60 mL/min  signify possible Chronic Kidney Disease.   Georgiann Hahn gap 10/26/2017 12  5 - 15 Final   Performed at [facility], [place] [address] [place], [place] [address]  . ABO/RH(D) 10/26/2017 A NEG   Final  . Antibody Screen 10/26/2017 POS   Final  . Sample Expiration 10/26/2017 10/29/2017   Final  . Antibody Identification 05/69/7948 ANTI D   Final  . Unit Number 10/26/2017 [id number]   Final  . Blood Component Type 10/26/2017 RBC LR PHER2   Final  . Unit division 10/26/2017 00   Final  . Status of Unit 10/26/2017 REL FROM [facility]   Final  . Transfusion Status 10/26/2017 OK TO TRANSFUSE   Final  . Crossmatch Result 10/26/2017 COMPATIBLE   Final  . Unit Number 10/26/2017 [id number]   Final  . Blood Component Type 10/26/2017 RED CELLS,LR   Final  . Unit division 10/26/2017 00   Final  . Status of Unit 10/26/2017 REL FROM [facility]   Final  . Transfusion Status 10/26/2017 OK TO TRANSFUSE   Final  . Crossmatch Result 10/26/2017 COMPATIBLE   Final  . FIO2 10/26/2017 ROOM AIR   Final  . pH, Arterial 10/26/2017 7.471* 7.350 - 7.450 Final  . pCO2 arterial 10/26/2017 40.8  32.0 - 48.0 mmHg Final  . pO2, Arterial 10/26/2017 113* 83.0 - 108.0 mmHg Final  . Bicarbonate 10/26/2017 29.4* 20.0 - 28.0 mmol/L Final  . Acid-Base Excess 10/26/2017 5.7* 0.0 - 2.0 mmol/L Final  . O2 Saturation 10/26/2017 98.4  % Final  . Patient temperature 10/26/2017 98.6   Final  . Drawn by 10/26/2017 ARTERIAL DRAW   Final  . Sample type 10/26/2017 ARTERIAL DRAW   Final  . Allens test (pass/fail) 10/26/2017 PASS  PASS Final  . Color, Urine 10/26/2017 YELLOW  YELLOW Final  . APPearance 10/26/2017 HAZY* CLEAR Final  . Specific Gravity, Urine 10/26/2017 1.018  1.005 - 1.030 Final  . pH 10/26/2017 5.0  5.0 - 8.0 Final  . Glucose, UA 10/26/2017 NEGATIVE  NEGATIVE mg/dL Final  . Hgb urine dipstick 10/26/2017 NEGATIVE  NEGATIVE Final  . Bilirubin Urine 10/26/2017 NEGATIVE   NEGATIVE Final  . Ketones, ur 10/26/2017 NEGATIVE  NEGATIVE mg/dL Final  . Protein, ur 10/26/2017 NEGATIVE  NEGATIVE mg/dL Final  . Nitrite 10/26/2017 POSITIVE* NEGATIVE Final  . Leukocytes, UA 10/26/2017 MODERATE* NEGATIVE Final  . RBC / HPF 10/26/2017 6-30  0 - 5 RBC/hpf Final  . WBC, UA 10/26/2017 TOO NUMEROUS TO COUNT  0 - 5 WBC/hpf Final  . Bacteria, UA 10/26/2017 MANY* NONE SEEN Final  . Squamous Epithelial / LPF 10/26/2017 0-5* NONE SEEN Final  . Mucus 10/26/2017 PRESENT   Final  . Hyaline Casts, UA 10/26/2017 PRESENT   Final   Performed at [facility], [place] [address], [place], [place] [address]  . ISSUE DATE / TIME 10/26/2017 [id number]   Final  . Blood Product Unit Number 10/26/2017 [id number]   Final  . PRODUCT CODE 10/26/2017 [id number]   Final  . Unit Type and Rh 10/26/2017 0600   Final  . Blood Product Expiration Date 10/26/2017 [id number]   Final  . ISSUE DATE / TIME 10/26/2017 [id number]   Final  . Blood Product Unit Number 10/26/2017 [id number]   Final  . PRODUCT CODE 10/26/2017 [id number]   Final  . Unit Type and Rh 10/26/2017 0600   Final  . Blood Product Expiration Date 10/26/2017 [id number]   Final  . MRSA by PCR 10/26/2017 NEGATIVE  NEGATIVE Final   Comment:        The GeneXpert MRSA Assay (FDA approved for NASAL specimens only), is one component of a comprehensive MRSA colonization surveillance program. It is not intended to diagnose MRSA infection nor to guide or monitor treatment for MRSA infections. Performed at [facility], [place] [address], [place], [place] [address]   . Sodium 10/27/2017 142  135 - 145 mmol/L Final  . Potassium 10/27/2017 3.7  3.5 - 5.1 mmol/L Final  . Chloride 10/27/2017 112* 101 - 111 mmol/L Final  . CO2 10/27/2017 20* 22 - 32 mmol/L Final  . Glucose, Bld 10/27/2017 147* 65 - 99 mg/dL Final  . BUN 10/27/2017 14  6 - 20 mg/dL Final  . Creatinine, Ser 10/27/2017 1.38* 0.44 - 1.00 mg/dL Final  . Calcium  10/27/2017 8.8* 8.9 - 10.3 mg/dL Final  . GFR calc non Af Amer 10/27/2017 36* >60 mL/min Final  . GFR calc Af Amer 10/27/2017 42* >60 mL/min Final   Comment: (NOTE) The eGFR has been calculated using the CKD EPI equation. This calculation has not been validated in all clinical situations. eGFR's persistently <60 mL/min signify possible Chronic Kidney Disease.   . Anion gap 10/27/2017 10  5 - 15 Final   Performed at [facility], [place] [address] [place], [place] [address]  . WBC 10/27/2017 13.1* 4.0 - 10.5 K/uL Final  . RBC 10/27/2017 3.65* 3.87 - 5.11 MIL/uL Final  . Hemoglobin 10/27/2017 10.3* 12.0 - 15.0 g/dL Final  . HCT 10/27/2017 32.2* 36.0 - 46.0 % Final  . MCV 10/27/2017 88.2  78.0 - 100.0 fL Final  . MCH 10/27/2017 28.2  26.0 - 34.0 pg Final  . MCHC 10/27/2017 32.0  30.0 - 36.0 g/dL Final  . RDW 10/27/2017 13.9  11.5 - 15.5 % Final  . Platelets 10/27/2017 181  150 - 400 K/uL Final  . Neutrophils Relative % 10/27/2017 90  % Final  . Neutro Abs 10/27/2017 11.9* 1.7 - 7.7 K/uL Final  . Lymphocytes Relative 10/27/2017 6  % Final  . Lymphs Abs 10/27/2017 0.7  0.7 - 4.0 K/uL Final  . Monocytes Relative 10/27/2017 4  % Final  . Monocytes Absolute 10/27/2017 0.5  0.1 - 1.0 K/uL Final  . Eosinophils Relative 10/27/2017 0  % Final  . Eosinophils Absolute 10/27/2017 0.0  0.0 - 0.7 K/uL Final  . Basophils Relative 10/27/2017 0  % Final  . Basophils Absolute 10/27/2017 0.0  0.0 - 0.1 K/uL Final   Performed at [facility], [place] [address] [place], [place] [address]  . aPTT 10/27/2017 40* 24 - 36 seconds Final   Comment:        IF BASELINE aPTT IS ELEVATED, SUGGEST PATIENT RISK ASSESSMENT BE USED TO DETERMINE APPROPRIATE ANTICOAGULANT THERAPY. Performed at [facility], [place] [address], [place], [place] [address]   . MRSA, PCR 10/27/2017 NEGATIVE  NEGATIVE Final  . Staphylococcus aureus 10/27/2017 NEGATIVE  NEGATIVE Final   Comment: (NOTE) The Xpert SA Assay (FDA  approved for NASAL specimens in patients 54 years of age and older), is one component of a comprehensive surveillance program. It is not intended to diagnose infection nor to guide or monitor treatment. Performed at [facility], [place] [address], [place], [place] [address]   . Order Confirmation 10/27/2017    Final                   Value:ORDER  PROCESSED BY BLOOD BANK Performed at [facility], [place] [address] [place],  [address]      Pathology No orders of the defined types were placed in this encounter.      [name] MD

## 2017-12-14 ENCOUNTER — Ambulatory Visit (HOSPITAL_COMMUNITY): Payer: Medicare HMO

## 2017-12-20 ENCOUNTER — Ambulatory Visit (HOSPITAL_COMMUNITY): Payer: Medicare HMO | Admitting: Hematology

## 2018-01-10 ENCOUNTER — Ambulatory Visit (HOSPITAL_COMMUNITY): Payer: Medicare HMO | Admitting: Hematology

## 2018-01-19 ENCOUNTER — Encounter (HOSPITAL_COMMUNITY): Payer: Self-pay | Admitting: Hematology

## 2018-01-19 ENCOUNTER — Other Ambulatory Visit: Payer: Self-pay

## 2018-01-19 ENCOUNTER — Inpatient Hospital Stay (HOSPITAL_COMMUNITY): Payer: Medicare HMO | Attending: Internal Medicine | Admitting: Hematology

## 2018-01-19 VITALS — BP 130/59 | HR 50 | Temp 98.0°F | Resp 18 | Ht 64.0 in | Wt 150.2 lb

## 2018-01-19 DIAGNOSIS — D447 Neoplasm of uncertain behavior of aortic body and other paraganglia: Secondary | ICD-10-CM | POA: Insufficient documentation

## 2018-01-19 DIAGNOSIS — I10 Essential (primary) hypertension: Secondary | ICD-10-CM | POA: Diagnosis not present

## 2018-01-19 DIAGNOSIS — E538 Deficiency of other specified B group vitamins: Secondary | ICD-10-CM | POA: Diagnosis not present

## 2018-01-19 DIAGNOSIS — R634 Abnormal weight loss: Secondary | ICD-10-CM | POA: Diagnosis not present

## 2018-01-19 DIAGNOSIS — D446 Neoplasm of uncertain behavior of carotid body: Secondary | ICD-10-CM

## 2018-01-19 DIAGNOSIS — E039 Hypothyroidism, unspecified: Secondary | ICD-10-CM | POA: Insufficient documentation

## 2018-01-19 NOTE — Progress Notes (Signed)
[facility] FOLLOW-UP NOTE  Patient Care Team: Sinda Du, MD as PCP - General (Internal Medicine) Bary Castilla, Forest Gleason, MD (General Surgery) Melida Quitter, MD as Consulting Physician (Otolaryngology) Luanne Bras, MD as Consulting Physician (Interventional Radiology)  CHIEF COMPLAINTS:  Paraganglioma of (L) carotid body   HISTORY OF PRESENTING ILLNESS:  Heather Cobb 77 y.o. female is here for follow-up for paraganglioma.   She underwent (L) carotid body excision with Dr. Redmond Baseman on 10/28/17.  Surgical path revealed paraganglioma. She saw Dr. Lisbeth Renshaw with radiation oncology on 11/14/17; he did not recommend adjuvant radiation therapy.    Her (L) ear, neck, and face are slightly tender and swollen.  She states that Dr. Redmond Baseman has released her from follow-up there.  Denies fevers or night sweats. She reports some weight loss, ~7 lbs in past 1 month. Reports that she has not been "eating like I should."  She noticed that the weight loss happened after she started a new blood pressure medication.    No known family history of cancer or paragangliomas that she is aware of.     MEDICAL HISTORY:  Past Medical History:  Diagnosis Date  . Anemia   . Carotid body tumor ([place])    left  . Hypertension   . Hypothyroid   . Pyelonephritis   . Sleep apnea    Stop Bang score of 4  . Vertigo   . Vitamin B 12 deficiency     SURGICAL HISTORY: Past Surgical History:  Procedure Laterality Date  . ABDOMINAL HYSTERECTOMY    . CATARACT EXTRACTION W/ INTRAOCULAR LENS  IMPLANT, BILATERAL    . CHOLECYSTECTOMY N/A 04/15/2015   Procedure: LAPAROSCOPIC CHOLECYSTECTOMY;  Surgeon: Aviva Signs, MD;  Location: [facility];  Service: General;  Laterality: N/A;  . COLONOSCOPY  2012   [place]  . COLONOSCOPY WITH PROPOFOL N/A 02/03/2016   Procedure: COLONOSCOPY WITH PROPOFOL;  Surgeon: Robert Bellow, MD;  Location: [facility] ENDOSCOPY;  Service: Endoscopy;  Laterality: N/A;  . IR ANGIO  EXTERNAL CAROTID SEL EXT CAROTID UNI L MOD SED  10/26/2017  . IR ANGIO INTRA EXTRACRAN SEL COM CAROTID INNOMINATE BILAT MOD SED  10/26/2017  . IR ANGIO VERTEBRAL SEL VERTEBRAL UNI R MOD SED  10/26/2017  . IR ANGIOGRAM EXTREMITY LEFT  10/26/2017  . IR ANGIOGRAM FOLLOW UP STUDY  10/26/2017  . IR NEURO EACH ADD'L AFTER BASIC UNI LEFT (MS)  10/26/2017  . IR RADIOLOGIST EVAL & MGMT  09/21/2017  . IR TRANSCATH/EMBOLIZ  10/26/2017  . MASS EXCISION Left 10/27/2017   Procedure: EXCISION CAROTID BODY TUMOR;  Surgeon: Melida Quitter, MD;  Location: [place];  Service: ENT;  Laterality: Left;  . RADIOLOGY WITH ANESTHESIA N/A 10/26/2017   Procedure: EMBOLIZATION;  Surgeon: Luanne Bras, MD;  Location: [place];  Service: Radiology;  Laterality: N/A;  . UPPER GASTROINTESTINAL ENDOSCOPY  2014  . VESICOVAGINAL FISTULA CLOSURE W/ TAH      SOCIAL HISTORY: Social History   Socioeconomic History  . Marital status: Widowed    Spouse name: Not on file  . Number of children: Not on file  . Years of education: Not on file  . Highest education level: Not on file  Occupational History  . Not on file  Social Needs  . Financial resource strain: Not on file  . Food insecurity:    Worry: Not on file    Inability: Not on file  . Transportation needs:    Medical: Not on file    Non-medical:  Not on file  Tobacco Use  . Smoking status: Never Smoker  . Smokeless tobacco: Never Used  Substance and Sexual Activity  . Alcohol use: No    Alcohol/week: 0.0 oz  . Drug use: No  . Sexual activity: Yes    Birth control/protection: Surgical  Lifestyle  . Physical activity:    Days per week: Not on file    Minutes per session: Not on file  . Stress: Not on file  Relationships  . Social connections:    Talks on phone: Not on file    Gets together: Not on file    Attends religious service: Not on file    Active member of club or organization: Not on file    Attends meetings of clubs or organizations: Not on file    Relationship  status: Not on file  . Intimate partner violence:    Fear of current or ex partner: Not on file    Emotionally abused: Not on file    Physically abused: Not on file    Forced sexual activity: Not on file  Other Topics Concern  . Not on file  Social History Narrative   ** Merged History Encounter **        FAMILY HISTORY: Family History  Problem Relation Age of Onset  . Asthma Mother   . Hypertension Daughter   . Cancer Neg Hx     ALLERGIES:  is allergic to codeine.  MEDICATIONS:  Current Outpatient Medications  Medication Sig Dispense Refill  . cholecalciferol (VITAMIN D) 1000 UNITS tablet Take 1,000 Units by mouth daily.    . ferrous fumarate (HEMOCYTE - 106 MG FE) 325 (106 FE) MG TABS Take 1 tablet (106 mg of iron total) by mouth daily. 30 each 0  . HYDROcodone-acetaminophen (NORCO/VICODIN) 5-325 MG tablet Take 1-2 tablets by mouth every 6 (six) hours as needed for moderate pain. 20 tablet 0  . levothyroxine (SYNTHROID, LEVOTHROID) 88 MCG tablet Take 88 mcg by mouth daily before breakfast.     . linaclotide (LINZESS) 145 MCG CAPS capsule Take 145 mcg by mouth daily as needed (constipation).    . naproxen sodium (ALEVE) 220 MG tablet Take 220 mg by mouth daily as needed (headaches).    . olmesartan-hydrochlorothiazide (BENICAR HCT) 40-12.5 MG per tablet Take 1 tablet by mouth daily.    . promethazine (PHENERGAN) 12.5 MG tablet Take 1 tablet (12.5 mg total) by mouth every 6 (six) hours as needed for nausea or vomiting. 10 tablet 0  . vitamin B-12 (CYANOCOBALAMIN) 100 MCG tablet Take 100 mcg by mouth daily.     No current facility-administered medications for this visit.     REVIEW OF SYSTEMS:   Constitutional: Denies fevers, chills or abnormal night sweats Eyes: Denies blurriness of vision, double vision or watery eyes Ears, nose, mouth, throat, and face: Denies mucositis or sore throat Respiratory: Denies cough, dyspnea or wheezes Cardiovascular: Denies palpitation, chest  discomfort or lower extremity swelling Gastrointestinal:  Denies nausea, heartburn or change in bowel habits Skin: Denies abnormal skin rashes Lymphatics: Denies new lymphadenopathy or easy bruising Neurological:Denies numbness, tingling or new weaknesses Behavioral/Psych: Mood is stable, no new changes  All other systems were reviewed with the patient and are negative.  PHYSICAL EXAMINATION: ECOG PERFORMANCE STATUS: 1 - Symptomatic but completely ambulatory  I have reviewed her vitals. GENERAL:alert, no distress and comfortable SKIN: skin color, texture, turgor are normal, no rashes or significant lesions EYES: normal, conjunctiva are pink and non-injected, sclera clear  OROPHARYNX:no exudate, no erythema and lips, buccal mucosa, and tongue normal  NECK: supple, thyroid normal size, non-tender, without nodularity.  Left neck scar is well-healed. LYMPH:  no palpable lymphadenopathy in the cervical, axillary or inguinal LUNGS: clear to auscultation and percussion with normal breathing effort HEART: regular rate & rhythm and no murmurs and no lower extremity edema ABDOMEN:abdomen soft, non-tender and normal bowel sounds Musculoskeletal:no cyanosis of digits and no clubbing  PSYCH: alert & oriented x 3 with fluent speech   LABORATORY DATA:  I have reviewed the data as listed Lab Results  Component Value Date   WBC 13.1 (H) 10/27/2017   HGB 10.3 (L) 10/27/2017   HCT 32.2 (L) 10/27/2017   MCV 88.2 10/27/2017   PLT 181 10/27/2017     Chemistry      Component Value Date/Time   NA 142 10/27/2017 0500   K 3.7 10/27/2017 0500   CL 112 (H) 10/27/2017 0500   CO2 20 (L) 10/27/2017 0500   BUN 14 10/27/2017 0500   CREATININE 1.38 (H) 10/27/2017 0500      Component Value Date/Time   CALCIUM 8.8 (L) 10/27/2017 0500   ALKPHOS 63 10/26/2017 0707   AST 22 10/26/2017 0707   ALT 13 (L) 10/26/2017 0707   BILITOT 0.6 10/26/2017 0707      PATHOLOGY:  (L) carotid body excision path:  10/27/17      RADIOGRAPHIC STUDIES:  I have personally reviewed CT scan of the neck from 08/15/2017.  ASSESSMENT & PLAN:  Paraganglioma ([place]) 1.  Paraganglioma of the left carotid artery: - Surgical pathology report on 10/27/2017 reports paraganglioma of ?  Biopsy.  Margin status was not mentioned. -Patient had work-up for 24-hour urine metanephrines and normetanephrines in December which were within normal limits. -Patient has recovered well from surgery.  I have recommended a CT scan of the neck with contrast for surveillance.  I have also recommended repeating 24-hour urine fractionated metanephrines and catecholamines.  After the CT scan, we will monitor scans once a year for the first 2 to 3 years.  After that we will do CT scans as needed.  We will continue to monitor 24-hour urine test every 6 months.  I have also recommended germline mutation testing for SDHB and VHL.  We will talk to the pathologist in order these tests.  She will be seen back in 1 month for follow-up.  She reports weight loss of 7 pounds since the start of new blood pressure medication 2 weeks ago.  I have recommended her to contact her PMD about it.  Orders Placed This Encounter  Procedures  . CT SOFT TISSUE NECK W CONTRAST    Standing Status:   Future    Standing Expiration Date:   04/22/2019    Order Specific Question:   If indicated for the ordered procedure, I authorize the administration of contrast media per Radiology protocol    Answer:   Yes    Order Specific Question:   Preferred imaging location?    Answer:   [facility]    Order Specific Question:   Radiology Contrast Protocol - do NOT remove file path    Answer:   [email or web address]    Order Specific Question:   Reason for Exam additional comments    Answer:   paraganglioma of left carotid body; follow-up s/p excision  . Metanephrines, urine, 24 hour    Standing Status:   Future    Standing Expiration Date:   01/20/2019    .  Catecholamines, fractionated, urine, 24 hour    Standing Status:   Future    Standing Expiration Date:   01/19/2019    All questions were answered. The patient knows to call the clinic with any problems, questions or concerns.    This note includes documentation from Mike Craze, NP, who was present during this patient's office visit and evaluation.  I have reviewed this note for its completeness and accuracy.  I have edited this note accordingly based on my findings and medical opinion.      Derek Jack, MD 01/19/2018 12:20 PM

## 2018-01-19 NOTE — Patient Instructions (Signed)
[facility] at [facility] Discharge Instructions  Today you saw Dr. K.   Thank you for choosing [facility] at [facility] to provide your oncology and hematology care.  To afford each patient quality time with our provider, please arrive at least 15 minutes before your scheduled appointment time.   If you have a lab appointment with the Cancer Center please come in thru the  Main Entrance and check in at the main information desk  You need to re-schedule your appointment should you arrive 10 or more minutes late.  We strive to give you quality time with our providers, and arriving late affects you and other patients whose appointments are after yours.  Also, if you no show three or more times for appointments you may be dismissed from the clinic at the providers discretion.     Again, thank you for choosing [facility].  Our hope is that these requests will decrease the amount of time that you wait before being seen by our physicians.       _____________________________________________________________  Should you have questions after your visit to [facility], please contact our office at [phone] between the hours of 8:30 a.m. and 4:30 p.m.  Voicemails left after 4:30 p.m. will not be returned until the following business day.  For prescription refill requests, have your pharmacy contact our office.       Resources For Cancer Patients and their Caregivers ? American Cancer Society: Can assist with transportation, wigs, general needs, runs Look Good Feel Better.        1-888-227-6333 ? Cancer Care: Provides financial assistance, online support groups, medication/co-pay assistance.  1-800-813-HOPE (4673) ? [facility] Assists [place] cancer patients and their families through emotional , educational and financial support.  336-427-4357 ? [place] DSS Where to apply for food  stamps, Medicaid and utility assistance. 336-342-1394 ? [facility]: Transportation to medical appointments. 336-347-2287 ? Social Security Administration: May apply for disability if have a Stage IV cancer. 336-342-7796 1-800-772-1213 ? [place] Aging, Disability and Transit Services: Assists with nutrition, care and transit needs. 336-349-2343  Cancer Center Support Programs:   > Cancer Support Group  2nd Tuesday of the month 1pm-2pm, Journey Room   > Creative Journey  3rd Tuesday of the month 1130am-1pm, Journey Room    

## 2018-01-19 NOTE — Progress Notes (Signed)
I have talked to [facility] and requested genetic testing for SDHx panel containing SDHD, SDHC, SDHB, SD HA, SDH AF-2.

## 2018-01-19 NOTE — Assessment & Plan Note (Signed)
1.  Paraganglioma of the left carotid artery: - Surgical pathology report on 10/27/2017 reports paraganglioma of ?  Biopsy.  Margin status was not mentioned. -Patient had work-up for 24-hour urine metanephrines and normetanephrines in December which were within normal limits. -Patient has recovered well from surgery.  I have recommended a CT scan of the neck with contrast for surveillance.  I have also recommended repeating 24-hour urine fractionated metanephrines and catecholamines.  After the CT scan, we will monitor scans once a year for the first 2 to 3 years.  After that we will do CT scans as needed.  We will continue to monitor 24-hour urine test every 6 months.  I have also recommended germline mutation testing for SDHB and VHL.  We will talk to the pathologist in order these tests.  She will be seen back in 1 month for follow-up.  She reports weight loss of 7 pounds since the start of new blood pressure medication 2 weeks ago.  I have recommended her to contact her PMD about it.

## 2018-01-23 ENCOUNTER — Other Ambulatory Visit (HOSPITAL_COMMUNITY): Payer: Self-pay | Admitting: Emergency Medicine

## 2018-01-23 DIAGNOSIS — D446 Neoplasm of uncertain behavior of carotid body: Secondary | ICD-10-CM

## 2018-01-25 ENCOUNTER — Telehealth (HOSPITAL_COMMUNITY): Payer: Self-pay | Admitting: Emergency Medicine

## 2018-01-25 ENCOUNTER — Telehealth: Payer: Self-pay | Admitting: Genetic Counselor

## 2018-01-25 ENCOUNTER — Other Ambulatory Visit (HOSPITAL_COMMUNITY): Payer: Medicare HMO

## 2018-01-25 NOTE — Telephone Encounter (Signed)
Pt will have genetic testing completed to have the gene panel that Dr Raliegh Ip wants completed.  Spoke with genetics in [place] and have faxed all her information to them.  Pt notified that we cancelled her lab appt for [facility] and she will have a genetics appt that will be made in [place].  She verbalized understanding.

## 2018-01-25 NOTE — Telephone Encounter (Signed)
An urgent genetic counseling referral has been sent from [facility]. The pt has been scheduled to see KAren on Monday 5/13 at 8am. Pt has been made aware of the appt date and time. Pt requested an email with the appointment information. Email has been sent.

## 2018-01-29 ENCOUNTER — Encounter: Payer: Self-pay | Admitting: Genetic Counselor

## 2018-01-29 ENCOUNTER — Inpatient Hospital Stay: Payer: Medicare HMO

## 2018-01-29 ENCOUNTER — Inpatient Hospital Stay: Payer: Medicare HMO | Attending: Genetic Counselor | Admitting: Genetic Counselor

## 2018-01-29 DIAGNOSIS — D446 Neoplasm of uncertain behavior of carotid body: Secondary | ICD-10-CM

## 2018-01-29 DIAGNOSIS — D447 Neoplasm of uncertain behavior of aortic body and other paraganglia: Secondary | ICD-10-CM | POA: Diagnosis not present

## 2018-01-29 DIAGNOSIS — Z800 Family history of malignant neoplasm of digestive organs: Secondary | ICD-10-CM | POA: Diagnosis not present

## 2018-01-29 DIAGNOSIS — Z7183 Encounter for nonprocreative genetic counseling: Secondary | ICD-10-CM | POA: Diagnosis not present

## 2018-01-29 NOTE — Progress Notes (Signed)
REFERRING PROVIDER: Derek Jack, MD [address] [place], [place] [address]  PRIMARY PROVIDER:  Sinda Du, MD  PRIMARY REASON FOR VISIT:  1. Carotid body tumor ([place])   2. Family history of colon cancer   3. Paraganglioma ([place])      HISTORY OF PRESENT ILLNESS:   Heather Cobb, a 77 y.o. female, was seen for a [place] cancer genetics consultation at the request of Dr. Delton Coombes due to a personal and family history of cancer.  Heather Cobb presents to clinic today to discuss the possibility of a hereditary predisposition to cancer, genetic testing, and to further clarify her future cancer risks, as well as potential cancer risks for family members.   In November 2018, at the age of 64, Heather Cobb was diagnosed with a paraganglioma of the head/neck. This was treated with surgical removal of the paraganglioma.  Reportedly this was benign.  She has been treated for hypertension and takes medication for this that control it well.  She has not had issues of heat intolerance, has not had a GIST or a pheochromocytoma in the past.     CANCER HISTORY:   No history exists.     HORMONAL RISK FACTORS:  Menarche was at age 89.  First live birth at age 61.  OCP use for approximately 0 years.  Ovaries intact: no.  Hysterectomy: yes.  Menopausal status: postmenopausal.  HRT use: She is curently on a patch years. Colonoscopy: yes; 3 polyps total. Mammogram within the last year: no. Number of breast biopsies: 0. Up to date with pelvic exams:  yes. Any excessive radiation exposure in the past:  no  Past Medical History:  Diagnosis Date  . Anemia   . Carotid body tumor ([place])    left  . Family history of colon cancer   . Hypertension   . Hypothyroid   . Pyelonephritis   . Sleep apnea    Stop Bang score of 4  . Vertigo   . Vitamin B 12 deficiency     Past Surgical History:  Procedure Laterality Date  . ABDOMINAL HYSTERECTOMY    . CATARACT EXTRACTION W/ INTRAOCULAR LENS   IMPLANT, BILATERAL    . CHOLECYSTECTOMY N/A 04/15/2015   Procedure: LAPAROSCOPIC CHOLECYSTECTOMY;  Surgeon: Aviva Signs, MD;  Location: [facility];  Service: General;  Laterality: N/A;  . COLONOSCOPY  2012   [place]  . COLONOSCOPY WITH PROPOFOL N/A 02/03/2016   Procedure: COLONOSCOPY WITH PROPOFOL;  Surgeon: Robert Bellow, MD;  Location: [facility];  Service: Endoscopy;  Laterality: N/A;  . IR ANGIO EXTERNAL CAROTID SEL EXT CAROTID UNI L MOD SED  10/26/2017  . IR ANGIO INTRA EXTRACRAN SEL COM CAROTID INNOMINATE BILAT MOD SED  10/26/2017  . IR ANGIO VERTEBRAL SEL VERTEBRAL UNI R MOD SED  10/26/2017  . IR ANGIOGRAM EXTREMITY LEFT  10/26/2017  . IR ANGIOGRAM FOLLOW UP STUDY  10/26/2017  . IR NEURO EACH ADD'L AFTER BASIC UNI LEFT (MS)  10/26/2017  . IR RADIOLOGIST EVAL & MGMT  09/21/2017  . IR TRANSCATH/EMBOLIZ  10/26/2017  . MASS EXCISION Left 10/27/2017   Procedure: EXCISION CAROTID BODY TUMOR;  Surgeon: Melida Quitter, MD;  Location: [facility];  Service: ENT;  Laterality: Left;  . RADIOLOGY WITH ANESTHESIA N/A 10/26/2017   Procedure: EMBOLIZATION;  Surgeon: Luanne Bras, MD;  Location: [facility] [place] [facility];  Service: Radiology;  Laterality: N/A;  . UPPER GASTROINTESTINAL ENDOSCOPY  2014  . VESICOVAGINAL FISTULA CLOSURE W/ TAH      Social History  Socioeconomic History  . Marital status: Widowed    Spouse name: Not on file  . Number of children: Not on file  . Years of education: Not on file  . Highest education level: Not on file  Occupational History  . Not on file  Social Needs  . Financial resource strain: Not on file  . Food insecurity:    Worry: Not on file    Inability: Not on file  . Transportation needs:    Medical: Not on file    Non-medical: Not on file  Tobacco Use  . Smoking status: Never Smoker  . Smokeless tobacco: Never Used  Substance and Sexual Activity  . Alcohol use: No    Alcohol/week: 0.0 oz  . Drug use: No  . Sexual activity: Yes    Birth control/protection: Surgical   Lifestyle  . Physical activity:    Days per week: Not on file    Minutes per session: Not on file  . Stress: Not on file  Relationships  . Social connections:    Talks on phone: Not on file    Gets together: Not on file    Attends religious service: Not on file    Active member of club or organization: Not on file    Attends meetings of clubs or organizations: Not on file    Relationship status: Not on file  Other Topics Concern  . Not on file  Social History Narrative   ** Merged History Encounter **         FAMILY HISTORY:  We obtained a detailed, 4-generation family history.  Significant diagnoses are listed below: Family History  Problem Relation Age of Onset  . Asthma Mother   . Kidney failure Mother   . Hypertension Daughter   . Colon cancer Father        dx in his 56s  . Colon cancer Maternal Uncle   . Heart attack Maternal Grandmother   . Cancer Neg Hx     The patient has one daughter who has hypertension that is well controlled, but is otherwise cancer free.  She has three paternal siblings - two sisters and a brother, and two maternal half brothers, all cancer free.  Both parents are deceased.  The patient's mother died at 13 from kidney failure.  She had 12 siblings, one who may have had colon cancer.  The grandparents died of non cancer related issues.  The patient's father died of colon cancer at 55.  There is no information about his side of the family.  Heather Cobb is unaware of previous family history of genetic testing for hereditary cancer risks. Patient's maternal ancestors are of African American descent, and paternal ancestors are of African American descent. There is no reported Ashkenazi Jewish ancestry. There is no known consanguinity.  GENETIC COUNSELING ASSESSMENT: Heather Cobb is a 77 y.o. female with a personal history of paraganglioma which is somewhat suggestive of a PGL/PHE syndrome and predisposition to cancer. We, therefore, discussed and  recommended the following at today's visit.   HEREDITARY PARAGANGLIOMA-PHEOCHROMOCYTOMA SYNDROME (PGL/PCC):  Hereditary paraganglioma-pheochromocytoma syndrome  predisposes to the development of multiple paragangliomas and pheochromocytomas.  Paragangliomas (PGLs) are slow-growing tumors that arise from the paraganglia tissue in the head and neck, thorax, abdomen or pelvis.  Pheochromocytomas (PCCs) are paragangliomas that are usually confined to the adrenal medulla.  PGLs can cause a variety of symptoms due to their size and the pressure they place on the surrounding tissues (e.g.. hearing loss/tinnitus)  or excess catecholamine secretion (e.g.. sustained elevated blood pressure, headaches, episodes of profuse sweating, palpitations, pallor, anxiety).  Approximately 29% of individuals with an SDHB mutation will develop one of these tumors by age 71, 45-50% by age 66-40 and 77% by age 28.  In some cases, paragangliomas can become malignant, spreading to other parts of the body.  Individuals who carry a mutation in the SDHB gene also appear to have a higher risk of kidney cancer and possibly papillary thyroid cancer.    DISCUSSION: We reviewed the characteristics, features and inheritance patterns of hereditary cancer syndromes. We also discussed genetic testing, including the appropriate family members to test, the process of testing, insurance coverage and turn-around-time for results. We discussed the implications of a negative, positive and/or variant of uncertain significant result. We recommended Heather Cobb pursue genetic testing for the common hereditary cancer gene panel. The Hereditary Gene Panel offered by [facility] includes sequencing and/or deletion duplication testing of the following 47 genes: APC, ATM, AXIN2, BARD1, BMPR1A, BRCA1, BRCA2, BRIP1, CDH1, CDK4, CDKN2A (p14ARF), CDKN2A (p16INK4a), CHEK2, CTNNA1, DICER1, EPCAM (Deletion/duplication testing only), GREM1 (promoter region deletion/duplication  testing only), KIT, MEN1, MLH1, MSH2, MSH3, MSH6, MUTYH, NBN, NF1, NHTL1, PALB2, PDGFRA, PMS2, POLD1, POLE, PTEN, RAD50, RAD51C, RAD51D, SDHB, SDHC, SDHD, SMAD4, SMARCA4. STK11, TP53, TSC1, TSC2, and VHL.  The following genes were evaluated for sequence changes only: SDHA and HOXB13 c.251G>A variant only.   Based on Heather Cobb's personal and family history of cancer, she meets medical criteria for genetic testing. Despite that she meets criteria, she may still have an out of pocket cost. We discussed that if her out of pocket cost for testing is over $100, the laboratory will call and confirm whether she wants to proceed with testing.  If the out of pocket cost of testing is less than $100 she will be billed by the genetic testing laboratory.   PLAN: After considering the risks, benefits, and limitations, Heather Cobb  provided informed consent to pursue genetic testing and the blood sample was sent to [facility] for analysis of the common hereditary cancer panel. Results should be available within approximately 2-3 weeks' time, at which point they will be disclosed by telephone to Heather Cobb, as will any additional recommendations warranted by these results. Heather Cobb will receive a summary of her genetic counseling visit and a copy of her results once available. This information will also be available in Epic. We encouraged Heather Cobb to remain in contact with cancer genetics annually so that we can continuously update the family history and inform her of any changes in cancer genetics and testing that may be of benefit for her family. Heather Cobb questions were answered to her satisfaction today. Our contact information was provided should additional questions or concerns arise.  Lastly, we encouraged Heather Cobb to remain in contact with cancer genetics annually so that we can continuously update the family history and inform her of any changes in cancer genetics and testing that may be of benefit  for this family.   Ms.  Cobb questions were answered to her satisfaction today. Our contact information was provided should additional questions or concerns arise. Thank you for the referral and allowing [place] to share in the care of your patient.   Karen P. Florene Glen, [place], [facility] Certified Genetic Counselor [email or web address] phone: [phone]  The patient was seen for a total of 55 minutes in face-to-face genetic counseling.  This patient was discussed with Drs. Magrinat, Lindi Adie and/or Burr Medico who  agrees with the above.    _______________________________________________________________________ For Office Staff:  Number of people involved in session: 2 Was an Intern/ student involved with case: no

## 2018-02-06 ENCOUNTER — Encounter: Payer: Self-pay | Admitting: Genetic Counselor

## 2018-02-06 ENCOUNTER — Ambulatory Visit: Payer: Self-pay | Admitting: Genetic Counselor

## 2018-02-06 ENCOUNTER — Telehealth: Payer: Self-pay | Admitting: Genetic Counselor

## 2018-02-06 DIAGNOSIS — D447 Neoplasm of uncertain behavior of aortic body and other paraganglia: Secondary | ICD-10-CM | POA: Diagnosis not present

## 2018-02-06 DIAGNOSIS — Z800 Family history of malignant neoplasm of digestive organs: Secondary | ICD-10-CM

## 2018-02-06 DIAGNOSIS — Z1379 Encounter for other screening for genetic and chromosomal anomalies: Secondary | ICD-10-CM | POA: Insufficient documentation

## 2018-02-06 NOTE — Telephone Encounter (Signed)
Revealed negative genetic testing.  Discussed that we do not know why she has a paraganglioma or why there is cancer in the family. It could be due to a different gene that we are not testing, or maybe our current technology may not be able to pick something up.  It will be important for her to keep in contact with genetics to keep up with whether additional testing may be needed.

## 2018-02-06 NOTE — Progress Notes (Addendum)
HPI:  Ms. Suttles was previously seen in the [place] clinic due to a personal history of a paraganglioma and a family history of cancer and concerns regarding a hereditary predisposition to cancer. Please refer to our prior cancer genetics clinic note for more information regarding Ms. Buonocore's medical, social and family histories, and our assessment and recommendations, at the time. Ms. Dileo recent genetic test results were disclosed to her, as were recommendations warranted by these results. These results and recommendations are discussed in more detail below.  CANCER HISTORY:   No history exists.    FAMILY HISTORY:  We obtained a detailed, 4-generation family history.  Significant diagnoses are listed below: Family History  Problem Relation Age of Onset  . Asthma Mother   . Kidney failure Mother   . Hypertension Daughter   . Colon cancer Father        dx in his 85s  . Colon cancer Maternal Uncle   . Heart attack Maternal Grandmother   . Cancer Neg Hx     The patient has one daughter who has hypertension that is well controlled, but is otherwise cancer free.  She has three paternal siblings - two sisters and a brother, and two maternal half brothers, all cancer free.  Both parents are deceased.  The patient's mother died at 38 from kidney failure.  She had 12 siblings, one who may have had colon cancer.  The grandparents died of non cancer related issues.  The patient's father died of colon cancer at 79.  There is no information about his side of the family.  Ms. Cullom is unaware of previous family history of genetic testing for hereditary cancer risks. Patient's maternal ancestors are of African American descent, and paternal ancestors are of African American descent. There is no reported Ashkenazi Jewish ancestry. There is no known consanguinity.  GENETIC TEST RESULTS: Genetic testing reported out on Feb 04, 2018 through the hereditary cancer panel found no  deleterious mutations.  APC, ATM, AXIN2, BARD1, BMPR1A, BRCA1, BRCA2, BRIP1, CDH1, CDK4, CDKN2A, CHEK2, DICER1, EPCAM, GREM1, KIT, MEN1, MLH1, MSH2, MSH6, MUTYH, NBN, NF1, PALB2, PDGFRA, PMS2, POLD1, POLE, PTEN, RAD50, RAD51C, RAD51D, SDHA, SDHB, SDHC, SDHD, SMAD4, SMARCA4. STK11, TP53, TSC1, TSC2, and VHL.   The test report has been scanned into EPIC and is located under the Molecular Pathology section of the Results Review tab.    We discussed with Ms. Brazell that since the current genetic testing is not perfect, it is possible there may be a gene mutation in one of these genes that current testing cannot detect, but that chance is small.  We also discussed, that it is possible that another gene that has not yet been discovered, or that we have not yet tested, is responsible for the cancer diagnoses in the family, and it is, therefore, important to remain in touch with cancer genetics in the future so that we can continue to offer Ms. Dalton the most up to date genetic testing.   Genetic testing did detect two Variants of Unknown Significance - one in the KIT gene called c.1594>A (p.Alagille) and the other in a gene called STK11 called c.1069>A (p.Glu318Lys).  At this time, it is unknown if these variants are associated with increased cancer risk or if they are a normal finding, but most variants such as these get reclassified to being inconsequential. They should not be used to make medical management decisions. With time, we suspect the lab will determine the significance of  these variants, if any. If we do learn more about them, we will try to contact Ms. Reimann to discuss it further. However, it is important to stay in touch with [place] periodically and keep the address and phone number up to date.     CANCER SCREENING RECOMMENDATIONS: This result is reassuring and indicates that Ms. Murchison likely does not have an increased risk for a future cancer due to a mutation in one of these genes. This normal test  also suggests that Ms. Ator's cancer was most likely not due to an inherited predisposition associated with one of these genes.  Most cancers happen by chance and this negative test suggests that her cancer falls into this category.  We, therefore, recommended she continue to follow the cancer management and screening guidelines provided by her oncology and primary healthcare provider.   An individual's cancer risk and medical management are not determined by genetic test results alone. Overall cancer risk assessment incorporates additional factors, including personal medical history, family history, and any available genetic information that may result in a personalized plan for cancer prevention and surveillance.  RECOMMENDATIONS FOR FAMILY MEMBERS:  Women in this family might be at some increased risk of developing cancer, over the general population risk, simply due to the family history of cancer.  We recommended women in this family have a yearly mammogram beginning at age 51, or 16 years younger than the earliest onset of cancer, an annual clinical breast exam, and perform monthly breast self-exams. Women in this family should also have a gynecological exam as recommended by their primary provider. All family members should have a colonoscopy by age 48.  FOLLOW-UP: Lastly, we discussed with Ms. Rosasco that cancer genetics is a rapidly advancing field and it is possible that new genetic tests will be appropriate for her and/or her family members in the future. We encouraged her to remain in contact with cancer genetics on an annual basis so we can update her personal and family histories and let her know of advances in cancer genetics that may benefit this family.   Our contact number was provided. Ms. Reddix questions were answered to her satisfaction, and she knows she is welcome to call us at anytime with additional questions or concerns.   Roma Kayser, MS, [facility] Certified Genetic  Counselor [email or web address]

## 2018-02-07 ENCOUNTER — Ambulatory Visit (HOSPITAL_COMMUNITY)
Admission: RE | Admit: 2018-02-07 | Discharge: 2018-02-07 | Disposition: A | Discharge status: Home / Self Care | Payer: Medicare HMO | Source: Ambulatory Visit | Attending: Hematology | Admitting: Hematology

## 2018-02-07 ENCOUNTER — Other Ambulatory Visit (HOSPITAL_COMMUNITY): Payer: Self-pay | Admitting: *Deleted

## 2018-02-07 DIAGNOSIS — D447 Neoplasm of uncertain behavior of aortic body and other paraganglia: Secondary | ICD-10-CM | POA: Insufficient documentation

## 2018-02-07 DIAGNOSIS — D446 Neoplasm of uncertain behavior of carotid body: Secondary | ICD-10-CM

## 2018-02-07 LAB — POCT I-STAT CREATININE: Creatinine, Ser: 1.3 mg/dL — ABNORMAL HIGH (ref 0.44–1.00)

## 2018-02-07 MED ORDER — IOPAMIDOL (ISOVUE-300) INJECTION 61%
75.0000 mL | Freq: Once | INTRAVENOUS | Status: DC | PRN
Start: 2018-02-07 — End: 2018-02-08

## 2018-02-07 MED ORDER — IOHEXOL 300 MG/ML  SOLN
75.0000 mL | Freq: Once | INTRAMUSCULAR | Status: AC | PRN
  Administered 2018-02-07: 75 mL via INTRAVENOUS

## 2018-02-13 LAB — METANEPHRINES, URINE, 24 HOUR
METANEPH TOTAL UR: 138 ug/L
METANEPHRINES 24H UR: 141 ug/(24.h) (ref 45–290)
Normetanephrine, 24H Ur: 451 ug/24 hr (ref 82–500)
Normetanephrine, Ur: 440 ug/L
Total Volume: 1025

## 2018-02-19 ENCOUNTER — Encounter (HOSPITAL_COMMUNITY): Payer: Self-pay | Admitting: Hematology

## 2018-02-19 ENCOUNTER — Other Ambulatory Visit: Payer: Self-pay

## 2018-02-19 ENCOUNTER — Inpatient Hospital Stay (HOSPITAL_COMMUNITY): Payer: Medicare HMO | Attending: Internal Medicine | Admitting: Hematology

## 2018-02-19 VITALS — BP 112/61 | HR 60 | Temp 98.2°F | Resp 16 | Wt 151.0 lb

## 2018-02-19 DIAGNOSIS — E538 Deficiency of other specified B group vitamins: Secondary | ICD-10-CM

## 2018-02-19 DIAGNOSIS — I10 Essential (primary) hypertension: Secondary | ICD-10-CM | POA: Diagnosis not present

## 2018-02-19 DIAGNOSIS — D447 Neoplasm of uncertain behavior of aortic body and other paraganglia: Secondary | ICD-10-CM | POA: Diagnosis present

## 2018-02-19 DIAGNOSIS — E039 Hypothyroidism, unspecified: Secondary | ICD-10-CM | POA: Diagnosis not present

## 2018-02-19 DIAGNOSIS — D446 Neoplasm of uncertain behavior of carotid body: Secondary | ICD-10-CM

## 2018-02-19 NOTE — Assessment & Plan Note (Signed)
1.  Paraganglioma of the left carotid artery: - Surgical pathology report on 10/27/2017 reports paraganglioma of left carotid body biopsy.  Margin status was not mentioned. -Patient had work-up for 24-hour urine metanephrines and normetanephrines in December which were within normal limits. - Repeat 24-hour urine fractionated catecholamines was also normal.  A CT scan of the neck on 02/07/2018 was negative except postsurgical changes. - Genetic testing for SDH and BHL was negative.  It showed VUS in KIT and STK11. -We will see her back in 6 months with repeat urine test and blood work.  We will do CT scan yearly for 2 years.  After that we will do it as needed.

## 2018-02-19 NOTE — Progress Notes (Signed)
[place], [place] [address]   CLINIC:  Medical Oncology/Hematology  PCP:  Sinda Du, MD [place] [address] [phone]   REASON FOR VISIT:  Follow-up for paraganglioma.  CURRENT THERAPY: Observation.   INTERVAL HISTORY:  Ms. Aurich 77 y.o. female returns for follow-up of CT scan results.  We have also than 24-hour urine.  She denies any complaints today.  REVIEW OF SYSTEMS:  Review of Systems  All other systems reviewed and are negative.    PAST MEDICAL/SURGICAL HISTORY:  Past Medical History:  Diagnosis Date  . Anemia   . Carotid body tumor ([place])    left  . Family history of colon cancer   . Hypertension   . Hypothyroid   . Pyelonephritis   . Sleep apnea    Stop Bang score of 4  . Vertigo   . Vitamin B 12 deficiency    Past Surgical History:  Procedure Laterality Date  . ABDOMINAL HYSTERECTOMY    . CATARACT EXTRACTION W/ INTRAOCULAR LENS  IMPLANT, BILATERAL    . CHOLECYSTECTOMY N/A 04/15/2015   Procedure: LAPAROSCOPIC CHOLECYSTECTOMY;  Surgeon: Aviva Signs, MD;  Location: [facility];  Service: General;  Laterality: N/A;  . COLONOSCOPY  2012   [place]  . COLONOSCOPY WITH PROPOFOL N/A 02/03/2016   Procedure: COLONOSCOPY WITH PROPOFOL;  Surgeon: Robert Bellow, MD;  Location[facility];  Service: Endoscopy;  Laterality: N/A;  . IR ANGIO EXTERNAL CAROTID SEL EXT CAROTID UNI L MOD SED  10/26/2017  . IR ANGIO INTRA EXTRACRAN SEL COM CAROTID INNOMINATE BILAT MOD SED  10/26/2017  . IR ANGIO VERTEBRAL SEL VERTEBRAL UNI R MOD SED  10/26/2017  . IR ANGIOGRAM EXTREMITY LEFT  10/26/2017  . IR ANGIOGRAM FOLLOW UP STUDY  10/26/2017  . IR NEURO EACH ADD'L AFTER BASIC UNI LEFT (MS)  10/26/2017  . IR RADIOLOGIST EVAL & MGMT  09/21/2017  . IR TRANSCATH/EMBOLIZ  10/26/2017  . MASS EXCISION Left 10/27/2017   Procedure: EXCISION CAROTID BODY TUMOR;  Surgeon: Melida Quitter, MD;  Location: [facility];  Service: ENT;   Laterality: Left;  . RADIOLOGY WITH ANESTHESIA N/A 10/26/2017   Procedure: EMBOLIZATION;  Surgeon: Luanne Bras, MD;  Location: [facility];  Service: Radiology;  Laterality: N/A;  . UPPER GASTROINTESTINAL ENDOSCOPY  2014  . VESICOVAGINAL FISTULA CLOSURE W/ TAH       SOCIAL HISTORY:  Social History   Socioeconomic History  . Marital status: Widowed    Spouse name: Not on file  . Number of children: Not on file  . Years of education: Not on file  . Highest education level: Not on file  Occupational History  . Not on file  Social Needs  . Financial resource strain: Not on file  . Food insecurity:    Worry: Not on file    Inability: Not on file  . Transportation needs:    Medical: Not on file    Non-medical: Not on file  Tobacco Use  . Smoking status: Never Smoker  . Smokeless tobacco: Never Used  Substance and Sexual Activity  . Alcohol use: No    Alcohol/week: 0.0 oz  . Drug use: No  . Sexual activity: Yes    Birth control/protection: Surgical  Lifestyle  . Physical activity:    Days per week: Not on file    Minutes per session: Not on file  . Stress: Not on file  Relationships  . Social connections:  Talks on phone: Not on file    Gets together: Not on file    Attends religious service: Not on file    Active member of club or organization: Not on file    Attends meetings of clubs or organizations: Not on file    Relationship status: Not on file  . Intimate partner violence:    Fear of current or ex partner: Not on file    Emotionally abused: Not on file    Physically abused: Not on file    Forced sexual activity: Not on file  Other Topics Concern  . Not on file  Social History Narrative   ** Merged History Encounter **        FAMILY HISTORY:  Family History  Problem Relation Age of Onset  . Asthma Mother   . Kidney failure Mother   . Hypertension Daughter   . Colon cancer Father        dx in his 22s  . Colon cancer Maternal Uncle   . Heart attack  Maternal Grandmother   . Cancer Neg Hx     CURRENT MEDICATIONS:  Outpatient Encounter Medications as of 02/19/2018  Medication Sig  . cholecalciferol (VITAMIN D) 1000 UNITS tablet Take 1,000 Units by mouth daily.  . ferrous fumarate (HEMOCYTE - 106 MG FE) 325 (106 FE) MG TABS Take 1 tablet (106 mg of iron total) by mouth daily.  Marland Kitchen HYDROcodone-acetaminophen (NORCO/VICODIN) 5-325 MG tablet Take 1-2 tablets by mouth every 6 (six) hours as needed for moderate pain.  Marland Kitchen levothyroxine (SYNTHROID, LEVOTHROID) 88 MCG tablet Take 88 mcg by mouth daily before breakfast.   . linaclotide (LINZESS) 145 MCG CAPS capsule Take 145 mcg by mouth daily as needed (constipation).  . naproxen sodium (ALEVE) 220 MG tablet Take 220 mg by mouth daily as needed (headaches).  . olmesartan-hydrochlorothiazide (BENICAR HCT) 40-12.5 MG per tablet Take 1 tablet by mouth daily.  . promethazine (PHENERGAN) 12.5 MG tablet Take 1 tablet (12.5 mg total) by mouth every 6 (six) hours as needed for nausea or vomiting.  . vitamin B-12 (CYANOCOBALAMIN) 100 MCG tablet Take 100 mcg by mouth daily.   No facility-administered encounter medications on file as of 02/19/2018.     ALLERGIES:  Allergies  Allergen Reactions  . Codeine Nausea Only and Other (See Comments)    fever     PHYSICAL EXAM:  ECOG Performance status: 1  Vitals:   02/19/18 1552  BP: 112/61  Pulse: 60  Resp: 16  Temp: 98.2 F (36.8 C)  SpO2: 100%   Filed Weights   02/19/18 1552  Weight: 151 lb (68.5 kg)    Physical Exam  Deferred. LABORATORY DATA:  I have reviewed the labs as listed.  CBC    Component Value Date/Time   WBC 13.1 (H) 10/27/2017 0500   RBC 3.65 (L) 10/27/2017 0500   HGB 10.3 (L) 10/27/2017 0500   HCT 32.2 (L) 10/27/2017 0500   PLT 181 10/27/2017 0500   MCV 88.2 10/27/2017 0500   MCH 28.2 10/27/2017 0500   MCHC 32.0 10/27/2017 0500   RDW 13.9 10/27/2017 0500   LYMPHSABS 0.7 10/27/2017 0500   MONOABS 0.5 10/27/2017 0500    EOSABS 0.0 10/27/2017 0500   BASOSABS 0.0 10/27/2017 0500   CMP Latest Ref Rng & Units 02/07/2018 10/27/2017 10/26/2017  Glucose 65 - 99 mg/dL - 147(H) 111(H)  BUN 6 - 20 mg/dL - 14 15  Creatinine 0.44 - 1.00 mg/dL 1.30(H) 1.38(H) 1.21(H)  Sodium 135 -  145 mmol/L - 142 140  Potassium 3.5 - 5.1 mmol/L - 3.7 3.3(L)  Chloride 101 - 111 mmol/L - 112(H) 104  CO2 22 - 32 mmol/L - 20(L) 24  Calcium 8.9 - 10.3 mg/dL - 8.8(L) 10.0  Total Protein 6.5 - 8.1 g/dL - - 6.7  Total Bilirubin 0.3 - 1.2 mg/dL - - 0.6  Alkaline Phos 38 - 126 U/L - - 63  AST 15 - 41 U/L - - 22  ALT 14 - 54 U/L - - 13(L)       DIAGNOSTIC IMAGING:  I have independently reviewed the images of the CT scan of the neck dated 02/07/2018.     ASSESSMENT & PLAN:   Carotid body tumor ([employer]) 1.  Paraganglioma of the left carotid artery: - Surgical pathology report on 10/27/2017 reports paraganglioma of left carotid body biopsy.  Margin status was not mentioned. -Patient had work-up for 24-hour urine metanephrines and normetanephrines in December which were within normal limits. - Repeat 24-hour urine fractionated catecholamines was also normal.  A CT scan of the neck on 02/07/2018 was negative except postsurgical changes. - Genetic testing for SDH and BHL was negative.  It showed VUS in KIT and STK11. -We will see her back in 6 months with repeat urine test and blood work.  We will do CT scan yearly for 2 years.  After that we will do it as needed.      Orders placed this encounter:  Orders Placed This Encounter  Procedures  . Catecholamines, fractionated, urine, 24 hour  . CBC with Differential  . Comprehensive metabolic panel      Derek Jack, MD [place] [phone]

## 2018-05-03 DIAGNOSIS — I10 Essential (primary) hypertension: Secondary | ICD-10-CM | POA: Diagnosis not present

## 2018-05-03 DIAGNOSIS — J302 Other seasonal allergic rhinitis: Secondary | ICD-10-CM | POA: Diagnosis not present

## 2018-05-03 DIAGNOSIS — E034 Atrophy of thyroid (acquired): Secondary | ICD-10-CM | POA: Diagnosis not present

## 2018-05-03 DIAGNOSIS — E785 Hyperlipidemia, unspecified: Secondary | ICD-10-CM | POA: Diagnosis not present

## 2018-07-09 ENCOUNTER — Telehealth: Payer: Self-pay | Admitting: General Surgery

## 2018-07-09 NOTE — Telephone Encounter (Signed)
Patient is calling and is having some rectal bleeding, said its been going on for about a month. Please call patient and advise.

## 2018-07-09 NOTE — Telephone Encounter (Signed)
Patient is coming in 11/12/20109 @ 1:15 pm . Advised to use stool softer and fiber daily. The patient is aware to call back for any questions or concerns.

## 2018-07-31 ENCOUNTER — Encounter: Payer: Self-pay | Admitting: General Surgery

## 2018-07-31 ENCOUNTER — Ambulatory Visit (INDEPENDENT_AMBULATORY_CARE_PROVIDER_SITE_OTHER): Payer: Medicare HMO | Admitting: General Surgery

## 2018-07-31 ENCOUNTER — Other Ambulatory Visit: Payer: Self-pay

## 2018-07-31 VITALS — BP 132/67 | HR 66 | Temp 97.5°F | Resp 13 | Ht 65.0 in | Wt 154.0 lb

## 2018-07-31 DIAGNOSIS — K625 Hemorrhage of anus and rectum: Secondary | ICD-10-CM

## 2018-07-31 NOTE — Progress Notes (Addendum)
Patient ID: Heather Cobb, female   DOB: 06/14/1941, 77 y.o.   MRN: [id number]  Chief Complaint  Patient presents with  . Follow-up    HPI Heather Cobb is a 77 y.o. female here today for her follow up rectal bleeding. Patient states she is still having bleeding, bright red blood. She noticed some clotting about three weeks ago. The bleeding is off and on.  Blood noted on the stool, not mixed in with the stool.  No pain. She had a hemorrhoid banding about two years ago.  Moves her bowels every other day.  The family brought a photograph of her recent paper towel with bright red blood. HPI  Past Medical History:  Diagnosis Date  . Anemia   . Carotid body tumor ([place])    left  . Family history of colon cancer   . Hypertension   . Hypothyroid   . Pyelonephritis   . Sleep apnea    Stop Bang score of 4  . Vertigo   . Vitamin B 12 deficiency     Past Surgical History:  Procedure Laterality Date  . ABDOMINAL HYSTERECTOMY    . CATARACT EXTRACTION W/ INTRAOCULAR LENS  IMPLANT, BILATERAL    . CHOLECYSTECTOMY N/A 04/15/2015   Procedure: LAPAROSCOPIC CHOLECYSTECTOMY;  Surgeon: Aviva Signs, MD;  Location: [facility];  Service: General;  Laterality: N/A;  . COLONOSCOPY  2012   [place]  . COLONOSCOPY WITH PROPOFOL N/A 02/03/2016   Procedure: COLONOSCOPY WITH PROPOFOL;  Surgeon: Robert Bellow, MD;  Location: [facility] ENDOSCOPY;  Service: Endoscopy;  Laterality: N/A;  . IR ANGIO EXTERNAL CAROTID SEL EXT CAROTID UNI L MOD SED  10/26/2017  . IR ANGIO INTRA EXTRACRAN SEL COM CAROTID INNOMINATE BILAT MOD SED  10/26/2017  . IR ANGIO VERTEBRAL SEL VERTEBRAL UNI R MOD SED  10/26/2017  . IR ANGIOGRAM EXTREMITY LEFT  10/26/2017  . IR ANGIOGRAM FOLLOW UP STUDY  10/26/2017  . IR NEURO EACH ADD'L AFTER BASIC UNI LEFT (MS)  10/26/2017  . IR RADIOLOGIST EVAL & MGMT  09/21/2017  . IR TRANSCATH/EMBOLIZ  10/26/2017  . MASS EXCISION Left 10/27/2017   Procedure: EXCISION CAROTID BODY TUMOR;  Surgeon: Melida Quitter, MD;   Location: [facility];  Service: ENT;  Laterality: Left;  . RADIOLOGY WITH ANESTHESIA N/A 10/26/2017   Procedure: EMBOLIZATION;  Surgeon: Luanne Bras, MD;  Location: [facility];  Service: Radiology;  Laterality: N/A;  . UPPER GASTROINTESTINAL ENDOSCOPY  2014  . VESICOVAGINAL FISTULA CLOSURE W/ TAH      Family History  Problem Relation Age of Onset  . Asthma Mother   . Kidney failure Mother   . Hypertension Daughter   . Colon cancer Father        dx in his 33s  . Colon cancer Maternal Uncle   . Heart attack Maternal Grandmother   . Cancer Neg Hx     Social History Social History   Tobacco Use  . Smoking status: Never Smoker  . Smokeless tobacco: Never Used  Substance Use Topics  . Alcohol use: No    Alcohol/week: 0.0 standard drinks  . Drug use: No    Allergies  Allergen Reactions  . Codeine Nausea Only and Other (See Comments)    fever    Current Outpatient Medications  Medication Sig Dispense Refill  . cholecalciferol (VITAMIN D) 1000 UNITS tablet Take 1,000 Units by mouth daily.    . ferrous fumarate (HEMOCYTE - 106 MG FE) 325 (106 FE) MG TABS Take 1  tablet (106 mg of iron total) by mouth daily. 30 each 0  . levothyroxine (SYNTHROID, LEVOTHROID) 88 MCG tablet Take 88 mcg by mouth daily before breakfast.     . linaclotide (LINZESS) 145 MCG CAPS capsule Take 145 mcg by mouth daily as needed (constipation).    . naproxen sodium (ALEVE) 220 MG tablet Take 220 mg by mouth daily as needed (headaches).    . olmesartan-hydrochlorothiazide (BENICAR HCT) 40-12.5 MG per tablet Take 1 tablet by mouth daily.    . promethazine (PHENERGAN) 12.5 MG tablet Take 1 tablet (12.5 mg total) by mouth every 6 (six) hours as needed for nausea or vomiting. 10 tablet 0  . vitamin B-12 (CYANOCOBALAMIN) 100 MCG tablet Take 100 mcg by mouth daily.     No current facility-administered medications for this visit.     Review of Systems Review of Systems  Constitutional: Negative.   Respiratory:  Negative.   Cardiovascular: Negative.   Gastrointestinal: Positive for anal bleeding.    Blood pressure 132/67, pulse 66, temperature (!) 97.5 F (36.4 C), temperature source Skin, resp. rate 13, height 5\' 5"  (1.651 m), weight 154 lb (69.9 kg).  Physical Exam Physical Exam  Constitutional: She is oriented to person, place, and time. She appears well-developed and well-nourished.  Eyes: Conjunctivae are normal. No scleral icterus.  Neck: Normal range of motion.  Cardiovascular: Normal rate, regular rhythm and normal heart sounds.  Pulmonary/Chest: Effort normal and breath sounds normal.  Genitourinary: Rectal exam shows internal hemorrhoid.  Lymphadenopathy:    She has no cervical adenopathy.  Neurological: She is alert and oriented to person, place, and time.  Skin: Skin is warm and dry.     Data Reviewed Colonoscopy of Feb 03, 2016 was reviewed.  Internal hemorrhoids.  No additional source for rectal bleeding noted.  Recurrent rectal bleeding September 2017.  No improvement with trial of Anusol HC suppositories.  Endoscopy of October 25, 2016 showed mildly prominent internal hemorrhoid anteriorly posteriorly.  Posterior group banded without difficulty.  Anterior group unsuccessfully banded.  No bleeding.  Management of carotid body tumor of February 2019 reviewed.  Uneventful postoperative course.  CBC of October 26, 2017 showed a hemoglobin of 11.7 with an MCV of 88, white blood cell count of 5500.  Platelet count of 185,000.  Urine metanephrine normal post excision of paraganglioma from the left neck.  Anoscopy today showed some small internal hemorrhoids, possible bleeding from the left posterior group, scant.  Assessment     Persistent bright red anorectal bleeding, anticipate anorectal source.    Plan  Indication for exam under anesthesia and hemorrhoid excision reviewed.  The patient is aware to call back for any questions or concerns.  HPI, Physical Exam,  Assessment and Plan have been scribed under the direction and in the presence of Hervey Ard, MD.  Gaspar Cola, CMA  The patient is scheduled for surgery at [facility] on 08/31/18 with Dr Bary Castilla. She will use a Fleets enema 2 hours prior to leaving for surgery that day. She will pre admit at the hospital on 08/22/18. The patient is aware of dates, times, and instructions.  Documented by Lesly Rubenstein LPN  I have completed the exam and reviewed the above documentation for accuracy and completeness.  I agree with the above.  Haematologist has been used and any errors in dictation or transcription are unintentional.  Hervey Ard, M.D., F.A.C.S.  [facility] Gle[facility] [place] 07/31/2018, 8:50 PM  Laboratory studies from the patient's PCP, Sinda Du, MD  dated August 17, 2017 became available for review.  Creatinine 1.16 with an estimated GFR of 53.  Potassium 3.3.  Otherwise normal electrolytes.  Hemoglobin of 12.0 with an MCV of 85, white blood cell count of 5200.  Platelet count of 217,000.

## 2018-07-31 NOTE — Patient Instructions (Addendum)
The patient is aware to call back for any questions or concerns.  The patient is scheduled for surgery at [facility] on 08/31/18 with Dr Bary Castilla. She will use a Fleets enema 2 hours prior to leaving for surgery that day. She will pre admit at the hospital on 08/22/18. The patient is aware of dates, times, and instructions.

## 2018-08-01 ENCOUNTER — Telehealth: Payer: Self-pay

## 2018-08-01 NOTE — Addendum Note (Signed)
Addended by: Robert Bellow on: 08/01/2018 08:18 PM   Modules accepted: Orders, SmartSet

## 2018-08-01 NOTE — Telephone Encounter (Signed)
Call to inform daughter and patient of pre admit date and time. The patient will pre admit at the hospital on 08/22/18 at 10:30 am. They are aware of date, time, and instructions.

## 2018-08-13 ENCOUNTER — Other Ambulatory Visit (HOSPITAL_COMMUNITY): Payer: Medicare HMO

## 2018-08-17 ENCOUNTER — Other Ambulatory Visit (HOSPITAL_COMMUNITY): Payer: Self-pay

## 2018-08-17 ENCOUNTER — Other Ambulatory Visit (HOSPITAL_COMMUNITY)
Admission: RE | Admit: 2018-08-17 | Discharge: 2018-08-17 | Disposition: A | Discharge status: Home / Self Care | Payer: Medicare HMO | Source: Other Acute Inpatient Hospital | Attending: Hematology | Admitting: Hematology

## 2018-08-17 DIAGNOSIS — D447 Neoplasm of uncertain behavior of aortic body and other paraganglia: Secondary | ICD-10-CM

## 2018-08-17 DIAGNOSIS — D446 Neoplasm of uncertain behavior of carotid body: Secondary | ICD-10-CM | POA: Insufficient documentation

## 2018-08-20 ENCOUNTER — Other Ambulatory Visit (HOSPITAL_COMMUNITY): Payer: Medicare HMO

## 2018-08-21 LAB — CATECHOLAMINES,UR.,FREE,24 HR
DOPAMINE RANDOM UR: 322 ug/L
Dopamine, Ur, 24Hr: 153 ug/24 hr (ref 0–510)
EPINEPHRINE RAND UR: 9 ug/L
Epinephrine, U, 24Hr: 4 ug/24 hr (ref 0–20)
NOREPINEPHRINE,U,24H: 39 ug/(24.h) (ref 0–135)
Norepinephrine, Rand Ur: 83 ug/L
TOTAL VOLUME: 475

## 2018-08-22 ENCOUNTER — Other Ambulatory Visit: Payer: Self-pay | Admitting: General Surgery

## 2018-08-22 ENCOUNTER — Encounter
Admission: RE | Admit: 2018-08-22 | Discharge: 2018-08-22 | Disposition: A | Discharge status: Home / Self Care | Payer: Medicare HMO | Source: Ambulatory Visit | Attending: General Surgery | Admitting: General Surgery

## 2018-08-22 ENCOUNTER — Other Ambulatory Visit: Payer: Self-pay

## 2018-08-22 ENCOUNTER — Telehealth: Payer: Self-pay

## 2018-08-22 DIAGNOSIS — I10 Essential (primary) hypertension: Secondary | ICD-10-CM | POA: Insufficient documentation

## 2018-08-22 DIAGNOSIS — K648 Other hemorrhoids: Secondary | ICD-10-CM | POA: Insufficient documentation

## 2018-08-22 DIAGNOSIS — R001 Bradycardia, unspecified: Secondary | ICD-10-CM | POA: Diagnosis not present

## 2018-08-22 DIAGNOSIS — R9431 Abnormal electrocardiogram [ECG] [EKG]: Secondary | ICD-10-CM | POA: Diagnosis not present

## 2018-08-22 DIAGNOSIS — Z01818 Encounter for other preprocedural examination: Secondary | ICD-10-CM | POA: Diagnosis present

## 2018-08-22 LAB — CBC WITH DIFFERENTIAL/PLATELET
Abs Immature Granulocytes: 0.01 10*3/uL (ref 0.00–0.07)
Basophils Absolute: 0 10*3/uL (ref 0.0–0.1)
Basophils Relative: 0 %
Eosinophils Absolute: 0.2 10*3/uL (ref 0.0–0.5)
Eosinophils Relative: 4 %
HCT: 37.7 % (ref 36.0–46.0)
Hemoglobin: 12 g/dL (ref 12.0–15.0)
Immature Granulocytes: 0 %
Lymphocytes Relative: 28 %
Lymphs Abs: 1.5 10*3/uL (ref 0.7–4.0)
MCH: 28.6 pg (ref 26.0–34.0)
MCHC: 31.8 g/dL (ref 30.0–36.0)
MCV: 90 fL (ref 80.0–100.0)
MONOS PCT: 11 %
Monocytes Absolute: 0.6 10*3/uL (ref 0.1–1.0)
Neutro Abs: 3.1 10*3/uL (ref 1.7–7.7)
Neutrophils Relative %: 57 %
Platelets: 212 10*3/uL (ref 150–400)
RBC: 4.19 MIL/uL (ref 3.87–5.11)
RDW: 13.6 % (ref 11.5–15.5)
WBC: 5.3 10*3/uL (ref 4.0–10.5)
nRBC: 0 % (ref 0.0–0.2)

## 2018-08-22 LAB — BASIC METABOLIC PANEL
Anion gap: 8 (ref 5–15)
BUN: 20 mg/dL (ref 8–23)
CALCIUM: 9.6 mg/dL (ref 8.9–10.3)
CO2: 32 mmol/L (ref 22–32)
Chloride: 102 mmol/L (ref 98–111)
Creatinine, Ser: 1.11 mg/dL — ABNORMAL HIGH (ref 0.44–1.00)
GFR calc Af Amer: 55 mL/min — ABNORMAL LOW (ref >60)
GFR calc non Af Amer: 48 mL/min — ABNORMAL LOW (ref >60)
Glucose, Bld: 103 mg/dL — ABNORMAL HIGH (ref 70–99)
Potassium: 3.2 mmol/L — ABNORMAL LOW (ref 3.5–5.1)
Sodium: 142 mmol/L (ref 135–145)

## 2018-08-22 MED ORDER — POTASSIUM CHLORIDE ER 20 MEQ PO TBCR: 1.0000 | EXTENDED_RELEASE_TABLET | Freq: Two times a day (BID) | ORAL | 0 refills | Status: DC

## 2018-08-22 NOTE — Patient Instructions (Signed)
Your procedure is scheduled on: Friday, August 31, 2018  Report to [facility]        DO NOT STOP ON THE FIRST FLOOR TO REGISTER  To find out your arrival time please call [phone] between 1PM - 3PM on Thursday, August 30, 2018  Remember: Instructions that are not followed completely may result in serious medical risk,  up to and including death, or upon the discretion of your surgeon and anesthesiologist your  surgery may need to be rescheduled.     _X__ 1. Do not eat food after midnight the night before your procedure.                 No gum chewing or hard candies.                     ABSOLUTELY NOTHING IN YOUR MOUTH AFTER MIDNIGHT                   You may drink clear liquids up to 2 hours before you are scheduled to arrive for your surgery-                   DO not drink clear liquids within 2 hours of the start of your surgery.                  Clear Liquids include:  water, apple juice without pulp, clear carbohydrate                 drink such as Clearfast of Gatorade, Black Coffee or Tea (Do not add                 anything to coffee or tea).  __X__2.  On the morning of surgery brush your teeth with toothpaste and water,                 You may rinse your mouth with mouthwash if you wish.                      Do not swallow any toothpaste of mouthwash.     _X__ 3.  No Alcohol for 24 hours before or after surgery.   _X__ 4.  Do Not Smoke or use e-cigarettes For 24 Hours Prior to Your Surgery.                 Do not use any chewable tobacco products for at least 6 hours prior to                 surgery.  ____  5.  Bring all medications with you on the day of surgery if instructed.   ____  6.  Notify your doctor if there is any change in your medical condition      (cold, fever, infections).     Do not wear jewelry, make-up, hairpins, clips or nail polish. Do not wear lotions, powders, or perfumes. You may wear  deodorant. Do not shave 48 hours prior to surgery. Men may shave face and neck. Do not bring valuables to the hospital.    [facility] is not responsible for any belongings or valuables.  Contacts, dentures or bridgework may not be worn into surgery. Leave your suitcase in the car. After surgery it may be brought to your room. For patients admitted to the hospital, discharge time is determined by your treatment team.   Patients  discharged the day of surgery will not be allowed to drive home.   Please read over the following fact sheets that you were given:                      PREPARING FOR SURGERY    ____ Take these medicines the morning of surgery with A SIP OF WATER:    1. OLMESARTAN  2. SYNTHROID  3.  VITAMIN B12  4.  VITAMIN D3  5. TAKE LINZESS THE DAY BEFORE SURGERY  6. FLEETS ENEMA 2 HOURS BEFORE SURGERY  __X__ Fleet Enema (as directed)   _X___ Use CHG Soap as directed  __X__ Stop ALL ASPIRIN PRODUCTS BY 08/24/2018  _X___ Stop Anti-inflammatories BY 08/24/2018              THIS INCLUDES IBUPROFEN / MOTRIN / ADVIL / ALEVE   __X__ Stop supplements until after surgery.                 STOP IRON AS OF 08/24/2018  ____ Bring C-Pap to the hospital.   CONTINUE TAKING HYDROCHLOROTHIAZIDE EVERY DAY BUT, DO NOT           TAKE ON THE DAY OF SURGERY.  DO NOT TAKE LINZESS ON THE MORNING OF SURGERY BUT YOU CAN TAKE           IT AFTER SURGERY. DO TAKE IT THE DAY BEFORE SURGERY  HAVE STOOL SOFTENERS TO USE AT HOME IF YOU ARE TAKING NARCOTICS       ON A REGULAR BASIS  DON'T CHANGE YOUR PATCH ON SURGERY DAY UNTIL AFTER SURGERTY

## 2018-08-22 NOTE — Pre-Procedure Instructions (Signed)
EKG OK BY DR FITZGERALD 

## 2018-08-22 NOTE — Telephone Encounter (Signed)
-----   Message from Robert Bellow, MD sent at 08/22/2018  3:59 PM EST ----- Please notify the patient her potassium is low, and I sent a prescription for potassium tablets to her pharmacy. I would like her to take one tablet twice a day between now and surgery.

## 2018-08-22 NOTE — Progress Notes (Signed)
Preop K+ low at 3.2. On diuretic. Will supplement preop.

## 2018-08-23 ENCOUNTER — Other Ambulatory Visit: Payer: Medicare HMO

## 2018-08-27 ENCOUNTER — Inpatient Hospital Stay (HOSPITAL_COMMUNITY): Payer: Medicare HMO | Attending: Hematology | Admitting: Hematology

## 2018-08-27 ENCOUNTER — Encounter (HOSPITAL_COMMUNITY): Payer: Self-pay | Admitting: Hematology

## 2018-08-27 ENCOUNTER — Other Ambulatory Visit: Payer: Self-pay

## 2018-08-27 ENCOUNTER — Ambulatory Visit (HOSPITAL_COMMUNITY): Payer: Medicare HMO | Admitting: Hematology

## 2018-08-27 VITALS — BP 130/64 | HR 53 | Temp 97.9°F | Resp 16 | Wt 157.2 lb

## 2018-08-27 DIAGNOSIS — Z800 Family history of malignant neoplasm of digestive organs: Secondary | ICD-10-CM | POA: Diagnosis not present

## 2018-08-27 DIAGNOSIS — E039 Hypothyroidism, unspecified: Secondary | ICD-10-CM | POA: Insufficient documentation

## 2018-08-27 DIAGNOSIS — Z7982 Long term (current) use of aspirin: Secondary | ICD-10-CM | POA: Insufficient documentation

## 2018-08-27 DIAGNOSIS — D446 Neoplasm of uncertain behavior of carotid body: Secondary | ICD-10-CM | POA: Diagnosis present

## 2018-08-27 DIAGNOSIS — D447 Neoplasm of uncertain behavior of aortic body and other paraganglia: Secondary | ICD-10-CM

## 2018-08-27 DIAGNOSIS — Z79899 Other long term (current) drug therapy: Secondary | ICD-10-CM | POA: Diagnosis not present

## 2018-08-27 DIAGNOSIS — I10 Essential (primary) hypertension: Secondary | ICD-10-CM | POA: Diagnosis not present

## 2018-08-27 NOTE — Patient Instructions (Signed)
[facility] Discharge Instructions     Thank you for choosing [facility] at [facility] to provide your oncology and hematology care.  To afford each patient quality time with our provider, please arrive at least 15 minutes before your scheduled appointment time.   If you have a lab appointment with the Cancer Center please come in thru the  Main Entrance and check in at the main information desk  You need to re-schedule your appointment should you arrive 10 or more minutes late.  We strive to give you quality time with our providers, and arriving late affects you and other patients whose appointments are after yours.  Also, if you no show three or more times for appointments you may be dismissed from the clinic at the providers discretion.     Again, thank you for choosing [facility].  Our hope is that these requests will decrease the amount of time that you wait before being seen by our physicians.       _____________________________________________________________  Should you have questions after your visit to [facility], please contact our office at [phone] between the hours of 8:00 a.m. and 4:30 p.m.  Voicemails left after 4:00 p.m. will not be returned until the following business day.  For prescription refill requests, have your pharmacy contact our office and allow 72 hours.    Cancer Center Support Programs:   > Cancer Support Group  2nd Tuesday of the month 1pm-2pm, Journey Room    

## 2018-08-27 NOTE — Progress Notes (Signed)
Heather Cobb, [place] [address]   CLINIC:  Medical Oncology/Hematology  PCP:  Sinda Du, [place] [address] [phone]   REASON FOR VISIT: Follow-up for paraganglioma.  CURRENT THERAPY: Observation   INTERVAL HISTORY:  Heather Cobb 77 y.o. female returns for routine follow-up for paraganglioma. She is here today with her friend. She is doing well. She is mildly tender at her surgery site. She has no other complaints at this time. She deneis any new pains. Denies any fevers or recent infections. Denies any nausea, vomiting, or diarrhea. Deneis any trouble swallowing. She reports her appetite at 100% and her energy level at 75%. She has no problem maintaining her weight at thist iem .    REVIEW OF SYSTEMS:  Review of Systems  All other systems reviewed and are negative.    PAST MEDICAL/SURGICAL HISTORY:  Past Medical History:  Diagnosis Date  . Anemia   . Carotid body tumor ([place])    left  . Family history of colon cancer   . Hypertension   . Hypothyroid   . Pyelonephritis   . Sleep apnea    Stop Bang score of 4. has never been tested.  . Vertigo   . Vitamin B 12 deficiency    Past Surgical History:  Procedure Laterality Date  . ABDOMINAL HYSTERECTOMY    . CATARACT EXTRACTION W/ INTRAOCULAR LENS  IMPLANT, BILATERAL    . CHOLECYSTECTOMY N/A 04/15/2015   Procedure: LAPAROSCOPIC CHOLECYSTECTOMY;  Surgeon: Aviva Signs, MD;  Location: [facility];  Service: General;  Laterality: N/A;  . COLONOSCOPY  2012   [place]  . COLONOSCOPY WITH PROPOFOL N/A 02/03/2016   Procedure: COLONOSCOPY WITH PROPOFOL;  Surgeon: Robert Bellow, MD;  Location: [facility] END[facility]COPY;  Service: Endoscopy;  Laterality: N/A;  . EYE SURGERY Bilateral    cataract extractions  . IR ANGIO EXTERNAL CAROTID SEL EXT CAROTID UNI L MOD SED  10/26/2017  . IR ANGIO INTRA EXTRACRAN SEL COM CAROTID INNOMINATE BILAT MOD SED  10/26/2017  . IR ANGIO  VERTEBRAL SEL VERTEBRAL UNI R MOD SED  10/26/2017  . IR ANGIOGRAM EXTREMITY LEFT  10/26/2017  . IR ANGIOGRAM FOLLOW UP STUDY  10/26/2017  . IR NEURO EACH ADD'L AFTER BASIC UNI LEFT (MS)  10/26/2017  . IR RADIOLOGIST EVAL & MGMT  09/21/2017  . IR TRANSCATH/EMBOLIZ  10/26/2017  . MASS EXCISION Left 10/27/2017   Procedure: EXCISION CAROTID BODY TUMOR;  Surgeon: Melida Quitter, MD;  Location: [facility];  Service: ENT;  Laterality: Left;  . RADIOLOGY WITH ANESTHESIA N/A 10/26/2017   Procedure: EMBOLIZATION;  Surgeon: Luanne Bras, MD;  Location: [facility];  Service: Radiology;  Laterality: N/A;  . UPPER GASTROINTESTINAL ENDOSCOPY  2014  . VESICOVAGINAL FISTULA CLOSURE W/ TAH       SOCIAL HISTORY:  Social History   Socioeconomic History  . Marital status: Widowed    Spouse name: Not on file  . Number of children: Not on file  . Years of education: Not on file  . Highest education level: Not on file  Occupational History  . 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 Financial resource strain: Not on file  . Food insecurity:    Worry: Not on file    Inability: Not on file  . Transportation needs:    Medical: Not on file    Non-medical: Not on file  Tobacco Use  . Smoking status: Never Smoker  .  Smokeless tobacco: Never Used  Substance and Sexual Activity  . Alcohol use: No    Alcohol/week: 0.0 standard drinks  . Drug use: No  . Sexual activity: Yes    Birth control/protection: Surgical  Lifestyle  . Physical activity:    Days per week: Not on file    Minutes per session: Not on file  . Stress: Not on file  Relationships  . Social connections:    Talks on phone: Not on file    Gets together: Not on file    Attends religious service: Not on file    Active member of club or organization: Not on file    Attends meetings of clubs or organizations: Not on file    Relationship status: Not on file  . Intimate partner violence:    Fear of current or ex partner: Not on file      Emotionally abused: Not on file    Physically abused: Not on file    Forced sexual activity: Not on file  Other Topics Concern  . Not on file  Social History Narrative   ** Merged History Encounter **        FAMILY HISTORY:  Family History  Problem Relation Age of Onset  . Asthma Mother   . Kidney failure Mother   . Hypertension Daughter   . Colon cancer Father        dx in his 34s  . Colon cancer Maternal Uncle   . Heart attack Maternal Grandmother   . Cancer Neg Hx     CURRENT MEDICATIONS:  Outpatient Encounter Medications as of 08/27/2018  Medication Sig  . aspirin EC 81 MG tablet Take 81 mg by mouth daily.  . cholecalciferol (VITAMIN D) 1000 UNITS tablet Take 1,000 Units by mouth daily.  Marland Kitchen estradiol (CLIMARA - DOSED IN MG/24 HR) 0.0375 mg/24hr patch Place 0.0375 mg onto the skin every Friday.  . ferrous sulfate 325 (65 FE) MG tablet Take 325 mg by mouth daily with breakfast.  . hydrochlorothiazide (HYDRODIURIL) 12.5 MG tablet Take 12.5 mg by mouth daily.  Marland Kitchen levothyroxine (SYNTHROID, LEVOTHROID) 88 MCG tablet Take 88 mcg by mouth daily before breakfast.   . linaclotide (LINZESS) 145 MCG CAPS capsule Take 145 mcg by mouth daily as needed (for constipation).   . naproxen sodium (ALEVE) 220 MG tablet Take 440 mg by mouth daily as needed (for joint pain).   . nystatin-triamcinolone (MYCOLOG II) cream Apply 1 application topically 2 (two) times daily as needed (for yeast infection).  Marland Kitchen olmesartan (BENICAR) 40 MG tablet Take 40 mg by mouth daily.  . Potassium Chloride ER 20 MEQ TBCR Take 1 tablet by mouth 2 (two) times daily.  . potassium chloride SA (K-DUR,KLOR-CON) 20 MEQ tablet Take 20 mEq by mouth 2 (two) times daily.  . vitamin B-12 (CYANOCOBALAMIN) 1000 MCG tablet Take 1,000 mcg by mouth daily.    No facility-administered encounter medications on file as of 08/27/2018.     ALLERGIES:  Allergies  Allergen Reactions  . Codeine Nausea Only and Other (See Comments)     fever     PHYSICAL EXAM:  ECOG Performance status: 1  Vitals:   08/27/18 1153  BP: 130/64  Pulse: (!) 53  Resp: 16  Temp: 97.9 F (36.6 C)  SpO2: 100%   Filed Weights   08/27/18 1153  Weight: 157 lb 3.2 oz (71.3 kg)    Physical Exam  Constitutional: She is oriented to person, place, and time. She appears well-developed  and well-nourished.  Cardiovascular: Normal rate, regular rhythm and normal heart sounds.  Pulmonary/Chest: Effort normal and breath sounds normal.  Musculoskeletal: Normal range of motion.  Neurological: She is alert and oriented to person, place, and time.  Skin: Skin is warm and dry.  Psychiatric: She has a normal mood and affect. Her behavior is normal. Judgment and thought content normal.     LABORATORY DATA:  I have reviewed the labs as listed.  CBC    Component Value Date/Time   WBC 5.3 08/22/2018 1137   RBC 4.19 08/22/2018 1137   HGB 12.0 08/22/2018 1137   HCT 37.7 08/22/2018 1137   PLT 212 08/22/2018 1137   MCV 90.0 08/22/2018 1137   MCH 28.6 08/22/2018 1137   MCHC 31.8 08/22/2018 1137   RDW 13.6 08/22/2018 1137   LYMPHSABS 1.5 08/22/2018 1137   MONOABS 0.6 08/22/2018 1137   EOSABS 0.2 08/22/2018 1137   BASOSABS 0.0 08/22/2018 1137   CMP Latest Ref Rng & Units 08/22/2018 02/07/2018 10/27/2017  Glucose 70 - 99 mg/dL 103(H) - 147(H)  BUN 8 - 23 mg/dL 20 - 14  Creatinine 0.44 - 1.00 mg/dL 1.11(H) 1.30(H) 1.38(H)  Sodium 135 - 145 mmol/L 142 - 142  Potassium 3.5 - 5.1 mmol/L 3.2(L) - 3.7  Chloride 98 - 111 mmol/L 102 - 112(H)  CO2 22 - 32 mmol/L 32 - 20(L)  Calcium 8.9 - 10.3 mg/dL 9.6 - 8.8(L)  Total Protein 6.5 - 8.1 g/dL - - -  Total Bilirubin 0.3 - 1.2 mg/dL - - -  Alkaline Phos 38 - 126 U/L - - -  AST 15 - 41 U/L - - -  ALT 14 - 54 U/L - - -      I have reviewed Francene Finders, NP's note and agree with the documentation.  I personally performed a face-to-face visit, made revisions and my assessment and plan is as  follows.      ASSESSMENT & PLAN:   Paraganglioma ([place]) 1.  Paraganglioma of the left carotid artery: - Surgical pathology report on 10/27/2017 reports paraganglioma of left carotid body biopsy.  Margin status was not mentioned. -Patient had work-up for 24-hour urine metanephrines and normetanephrines in December which were within normal limits. - CT scan of the neck reviewed by me on 02/07/2018 was negative except postsurgical changes.  - Genetic testing for SDH and BHL was negative.  It showed VUS in KIT and STK11. -We reviewed the blood work.  We have also reviewed her 24-hour urine metanephrines which are more or less stable. -We will see her back in 6 months with repeat urine test and blood work.  We will do CT scan yearly for 2 years.  After that we will do it as needed.       Orders placed this encounter:  Orders Placed This Encounter  Procedures  . CT SOFT TISSUE NECK W CONTRAST  . CBC with Differential/Platelet  . Comprehensive metabolic panel  . Catecholamines, fractionated, urine, 24 hour      Derek Jack, MD [place] [phone]

## 2018-08-27 NOTE — Assessment & Plan Note (Signed)
1.  Paraganglioma of the left carotid artery: - Surgical pathology report on 10/27/2017 reports paraganglioma of left carotid body biopsy.  Margin status was not mentioned. -Patient had work-up for 24-hour urine metanephrines and normetanephrines in December which were within normal limits. - CT scan of the neck reviewed by me on 02/07/2018 was negative except postsurgical changes.  - Genetic testing for SDH and BHL was negative.  It showed VUS in KIT and STK11. -We reviewed the blood work.  We have also reviewed her 24-hour urine metanephrines which are more or less stable. -We will see her back in 6 months with repeat urine test and blood work.  We will do CT scan yearly for 2 years.  After that we will do it as needed.

## 2018-08-30 MED ORDER — METRONIDAZOLE IN NACL 5-0.79 MG/ML-% IV SOLN
500.0000 mg | INTRAVENOUS | Status: AC
  Administered 2018-08-31: 500 mg via INTRAVENOUS
  Filled 2018-08-30: qty 100

## 2018-08-31 ENCOUNTER — Ambulatory Visit: Payer: Medicare HMO | Admitting: Anesthesiology

## 2018-08-31 ENCOUNTER — Encounter
Admission: RE | Disposition: A | Discharge status: Home / Self Care | Payer: Self-pay | Source: Ambulatory Visit | Attending: General Surgery

## 2018-08-31 ENCOUNTER — Encounter: Payer: Self-pay | Admitting: *Deleted

## 2018-08-31 ENCOUNTER — Other Ambulatory Visit: Payer: Self-pay

## 2018-08-31 ENCOUNTER — Ambulatory Visit
Admission: RE | Admit: 2018-08-31 | Discharge: 2018-08-31 | Disposition: A | Discharge status: Home / Self Care | Payer: Medicare HMO | Source: Ambulatory Visit | Attending: General Surgery | Admitting: General Surgery

## 2018-08-31 DIAGNOSIS — K648 Other hemorrhoids: Secondary | ICD-10-CM | POA: Diagnosis not present

## 2018-08-31 DIAGNOSIS — I10 Essential (primary) hypertension: Secondary | ICD-10-CM | POA: Insufficient documentation

## 2018-08-31 DIAGNOSIS — K5909 Other constipation: Secondary | ICD-10-CM | POA: Diagnosis not present

## 2018-08-31 DIAGNOSIS — Z7982 Long term (current) use of aspirin: Secondary | ICD-10-CM | POA: Diagnosis not present

## 2018-08-31 DIAGNOSIS — K640 First degree hemorrhoids: Secondary | ICD-10-CM | POA: Diagnosis not present

## 2018-08-31 DIAGNOSIS — K625 Hemorrhage of anus and rectum: Secondary | ICD-10-CM

## 2018-08-31 DIAGNOSIS — E039 Hypothyroidism, unspecified: Secondary | ICD-10-CM | POA: Insufficient documentation

## 2018-08-31 DIAGNOSIS — E538 Deficiency of other specified B group vitamins: Secondary | ICD-10-CM | POA: Diagnosis not present

## 2018-08-31 DIAGNOSIS — Z79899 Other long term (current) drug therapy: Secondary | ICD-10-CM | POA: Diagnosis not present

## 2018-08-31 DIAGNOSIS — Z79890 Hormone replacement therapy (postmenopausal): Secondary | ICD-10-CM | POA: Diagnosis not present

## 2018-08-31 DIAGNOSIS — D649 Anemia, unspecified: Secondary | ICD-10-CM | POA: Insufficient documentation

## 2018-08-31 HISTORY — PX: SIGMOIDOSCOPY: SHX6686

## 2018-08-31 HISTORY — PX: HEMORRHOID SURGERY: SHX153

## 2018-08-31 LAB — POCT I-STAT 4, (NA,K, GLUC, HGB,HCT)
Glucose, Bld: 101 mg/dL — ABNORMAL HIGH (ref 70–99)
HCT: 35 % — ABNORMAL LOW (ref 36.0–46.0)
HEMOGLOBIN: 11.9 g/dL — AB (ref 12.0–15.0)
Potassium: 3.7 mmol/L (ref 3.5–5.1)
Sodium: 142 mmol/L (ref 135–145)

## 2018-08-31 SURGERY — HEMORRHOIDECTOMY
Anesthesia: General

## 2018-08-31 MED ORDER — FENTANYL CITRATE (PF) 100 MCG/2ML IJ SOLN
INTRAMUSCULAR | Status: AC
  Filled 2018-08-31: qty 2

## 2018-08-31 MED ORDER — FAMOTIDINE 20 MG PO TABS
20.0000 mg | ORAL_TABLET | Freq: Once | ORAL | Status: AC
  Administered 2018-08-31: 20 mg via ORAL

## 2018-08-31 MED ORDER — EPINEPHRINE PF 1 MG/ML IJ SOLN
INTRAMUSCULAR | Status: AC
  Filled 2018-08-31: qty 1

## 2018-08-31 MED ORDER — BUPIVACAINE HCL (PF) 0.5 % IJ SOLN
INTRAMUSCULAR | Status: AC
  Filled 2018-08-31: qty 30

## 2018-08-31 MED ORDER — GLYCOPYRROLATE 0.2 MG/ML IJ SOLN
INTRAMUSCULAR | Status: DC | PRN
  Administered 2018-08-31: 0.2 mg via INTRAVENOUS

## 2018-08-31 MED ORDER — LIDOCAINE HCL (PF) 2 % IJ SOLN
INTRAMUSCULAR | Status: AC
  Filled 2018-08-31: qty 10

## 2018-08-31 MED ORDER — MIDAZOLAM HCL 2 MG/2ML IJ SOLN
INTRAMUSCULAR | Status: AC
  Filled 2018-08-31: qty 2

## 2018-08-31 MED ORDER — SODIUM CHLORIDE FLUSH 0.9 % IV SOLN
INTRAVENOUS | Status: AC
  Filled 2018-08-31: qty 50

## 2018-08-31 MED ORDER — METRONIDAZOLE 250 MG PO TABS: 250.0000 mg | ORAL_TABLET | Freq: Three times a day (TID) | ORAL | 0 refills | Status: AC

## 2018-08-31 MED ORDER — ONDANSETRON HCL 4 MG/2ML IJ SOLN: 4.0000 mg | Freq: Once | INTRAMUSCULAR | Status: DC | PRN

## 2018-08-31 MED ORDER — TRAMADOL HCL 50 MG PO TABS: 50.0000 mg | ORAL_TABLET | ORAL | 0 refills | Status: DC | PRN

## 2018-08-31 MED ORDER — FENTANYL CITRATE (PF) 100 MCG/2ML IJ SOLN
INTRAMUSCULAR | Status: DC | PRN
  Administered 2018-08-31 (×4): 25 ug via INTRAVENOUS

## 2018-08-31 MED ORDER — PROPOFOL 10 MG/ML IV BOLUS
INTRAVENOUS | Status: DC | PRN
  Administered 2018-08-31: 60 mg via INTRAVENOUS
  Administered 2018-08-31: 140 mg via INTRAVENOUS

## 2018-08-31 MED ORDER — FAMOTIDINE 20 MG PO TABS
ORAL_TABLET | ORAL | Status: AC
  Filled 2018-08-31: qty 1

## 2018-08-31 MED ORDER — FENTANYL CITRATE (PF) 100 MCG/2ML IJ SOLN
25.0000 ug | INTRAMUSCULAR | Status: DC | PRN
  Administered 2018-08-31 (×2): 25 ug via INTRAVENOUS

## 2018-08-31 MED ORDER — ONDANSETRON HCL 4 MG PO TABS: 4.0000 mg | ORAL_TABLET | Freq: Every day | ORAL | 1 refills | Status: DC | PRN

## 2018-08-31 MED ORDER — ONDANSETRON HCL 4 MG/2ML IJ SOLN
INTRAMUSCULAR | Status: DC | PRN
  Administered 2018-08-31: 4 mg via INTRAVENOUS

## 2018-08-31 MED ORDER — BUPIVACAINE-EPINEPHRINE 0.25% -1:200000 IJ SOLN
INTRAMUSCULAR | Status: DC | PRN
  Administered 2018-08-31: 30 mL

## 2018-08-31 MED ORDER — FENTANYL CITRATE (PF) 100 MCG/2ML IJ SOLN
INTRAMUSCULAR | Status: AC
  Administered 2018-08-31: 25 ug via INTRAVENOUS
  Filled 2018-08-31: qty 2

## 2018-08-31 MED ORDER — LACTATED RINGERS IV SOLN
INTRAVENOUS | Status: DC
  Administered 2018-08-31: 08:00:00 via INTRAVENOUS

## 2018-08-31 MED ORDER — TRAMADOL HCL 50 MG PO TABS
50.0000 mg | ORAL_TABLET | Freq: Four times a day (QID) | ORAL | Status: DC
  Filled 2018-08-31: qty 1

## 2018-08-31 MED ORDER — LIDOCAINE HCL (CARDIAC) PF 100 MG/5ML IV SOSY
PREFILLED_SYRINGE | INTRAVENOUS | Status: DC | PRN
  Administered 2018-08-31: 100 mg via INTRAVENOUS

## 2018-08-31 MED ORDER — BUPIVACAINE LIPOSOME 1.3 % IJ SUSP
INTRAMUSCULAR | Status: AC
  Filled 2018-08-31: qty 20

## 2018-08-31 MED ORDER — BUPIVACAINE-EPINEPHRINE (PF) 0.5% -1:200000 IJ SOLN
INTRAMUSCULAR | Status: AC
  Filled 2018-08-31: qty 30

## 2018-08-31 MED ORDER — DEXAMETHASONE SODIUM PHOSPHATE 10 MG/ML IJ SOLN
INTRAMUSCULAR | Status: DC | PRN
  Administered 2018-08-31: 6 mg via INTRAVENOUS

## 2018-08-31 SURGICAL SUPPLY — 32 items
BLADE SURG 15 STRL SS SAFETY (BLADE) ×3 IMPLANT
BRIEF STRETCH MATERNITY 2XLG (MISCELLANEOUS) ×3 IMPLANT
CANISTER SUCT 1200ML W/VALVE (MISCELLANEOUS) ×3 IMPLANT
COVER WAND RF STERILE (DRAPES) ×3 IMPLANT
DRAPE LAPAROTOMY 100X77 ABD (DRAPES) ×3 IMPLANT
DRAPE LEGGINS SURG 28X43 STRL (DRAPES) ×3 IMPLANT
DRAPE UNDER BUTTOCK W/FLU (DRAPES) ×3 IMPLANT
DRSG GAUZE PETRO 6X36 STRIP ST (GAUZE/BANDAGES/DRESSINGS) ×3 IMPLANT
ELECT REM PT RETURN 9FT ADLT (ELECTROSURGICAL) ×3
ELECTRODE REM PT RTRN 9FT ADLT (ELECTROSURGICAL) ×1 IMPLANT
GLOVE BIO SURGEON STRL SZ7.5 (GLOVE) ×3 IMPLANT
GLOVE INDICATOR 8.0 STRL GRN (GLOVE) ×3 IMPLANT
GOWN STRL REUS W/ TWL LRG LVL3 (GOWN DISPOSABLE) ×2 IMPLANT
GOWN STRL REUS W/TWL LRG LVL3 (GOWN DISPOSABLE) ×6
KIT TURNOVER KIT A (KITS) ×3 IMPLANT
LABEL OR SOLS (LABEL) ×3 IMPLANT
NDL HYPO 25X1 1.5 SAFETY (NEEDLE) ×1 IMPLANT
NEEDLE HYPO 22GX1.5 SAFETY (NEEDLE) ×3 IMPLANT
NEEDLE HYPO 25X1 1.5 SAFETY (NEEDLE) ×3 IMPLANT
PACK BASIN MINOR ARMC (MISCELLANEOUS) ×3 IMPLANT
PAD OB MATERNITY 4.3X12.25 (PERSONAL CARE ITEMS) ×3 IMPLANT
PAD PREP 24X41 OB/GYN DISP (PERSONAL CARE ITEMS) ×3 IMPLANT
SHEARS FOC LG CVD HARMONIC 17C (MISCELLANEOUS) ×2 IMPLANT
SOL PREP PVP 2OZ (MISCELLANEOUS) ×3
SOLUTION PREP PVP 2OZ (MISCELLANEOUS) ×1 IMPLANT
SUCT SIGMOIDOSCOPE TIP 18 W/TU (SUCTIONS) ×3 IMPLANT
SURGILUBE 2OZ TUBE FLIPTOP (MISCELLANEOUS) ×3 IMPLANT
SUT CHROMIC 3 0 SH 27 (SUTURE) ×3 IMPLANT
SUT SILK 0 CT 1 30 (SUTURE) ×3 IMPLANT
SUT VIC AB 3-0 SH 27 (SUTURE)
SUT VIC AB 3-0 SH 27X BRD (SUTURE) IMPLANT
SYR 10ML LL (SYRINGE) ×6 IMPLANT

## 2018-08-31 NOTE — Discharge Instructions (Signed)
Surgical Procedures for Hemorrhoids, Care After Refer to this sheet in the next few weeks. These instructions provide you with information about caring for yourself after your procedure. Your health care provider may also give you more specific instructions. Your treatment has been planned according to current medical practices, but problems sometimes occur. Call your health care provider if you have any problems or questions after your procedure. What can I expect after the procedure? After the procedure, it is common to have:  Rectal pain.  Pain when you are having a bowel movement.  Slight rectal bleeding.  Follow these instructions at home: Medicines  Take over-the-counter and prescription medicines only as told by your health care provider.  Do not drive or operate heavy machinery while taking prescription pain medicine.  Use a stool softener or a bulk laxative as told by your health care provider. Activity  Rest at home. Return to your normal activities as told by your health care provider.  Do not lift anything that is heavier than 10 lb (4.5 kg).  Do not sit for long periods of time. Take a walk every day or as told by your health care provider.  Do not strain to have a bowel movement. Do not spend a long time sitting on the toilet. Eating and drinking  Eat foods that contain fiber, such as whole grains, beans, nuts, fruits, and vegetables.  Drink enough fluid to keep your urine clear or pale yellow. General instructions  Sit in a warm bath 2-3 times per day to relieve soreness or itching.  Keep all follow-up visits as told by your health care provider. This is important. Contact a health care provider if:  Your pain medicine is not helping.  You have a fever or chills.  You become constipated.  You have trouble passing urine. Get help right away if:  You have very bad rectal pain.  You have heavy bleeding from your rectum. This information is not intended  to replace advice given to you by your health care provider. Make sure you discuss any questions you have with your health care provider. Document Released: 11/26/2003 Document Revised: 02/11/2016 Document Reviewed: 12/01/2014 Elsevier Interactive Patient Education  2018 [place]   1) The drugs that you were given will stay in your system until tomorrow so for the next 24 hours you should not:  A) Drive an automobile B) Make any legal decisions C) Drink any alcoholic beverage   2) You may resume regular meals tomorrow.  Today it is better to start with liquids and gradually work up to solid foods.  You may eat anything you prefer, but it is better to start with liquids, then soup and crackers, and gradually work up to solid foods.   3) Please notify your doctor immediately if you have any unusual bleeding, trouble breathing, redness and pain at the surgery site, drainage, fever, or pain not relieved by medication.    4) Additional Instructions:        Please contact your physician with any problems or Same Day Surgery at [phone], Monday through Friday 6 am to 4 pm, or [place] at [facility] number at [phone].

## 2018-08-31 NOTE — H&P (Signed)
Heather Cobb [id number] 1941/09/07     HPI:  No change in clinical history or exam.  Single episode of bleeding after enema this AM.   Medications Prior to Admission  Medication Sig Dispense Refill Last Dose  . cholecalciferol (VITAMIN D) 1000 UNITS tablet Take 1,000 Units by mouth daily.   08/29/2018  . estradiol (CLIMARA - DOSED IN MG/24 HR) 0.0375 mg/24hr patch Place 0.0375 mg onto the skin every Friday.   08/30/2018  . ferrous sulfate 325 (65 FE) MG tablet Take 325 mg by mouth daily with breakfast.   08/24/2018  . hydrochlorothiazide (HYDRODIURIL) 12.5 MG tablet Take 12.5 mg by mouth daily.   08/29/2018  . levothyroxine (SYNTHROID, LEVOTHROID) 88 MCG tablet Take 88 mcg by mouth daily before breakfast.    08/29/2018  . linaclotide (LINZESS) 145 MCG CAPS capsule Take 145 mcg by mouth daily as needed (for constipation).    08/30/2018  . naproxen sodium (ALEVE) 220 MG tablet Take 440 mg by mouth daily as needed (for joint pain).    08/27/2018  . nystatin-triamcinolone (MYCOLOG II) cream Apply 1 application topically 2 (two) times daily as needed (for yeast infection).   08/29/2018 at Unknown time  . olmesartan (BENICAR) 40 MG tablet Take 40 mg by mouth daily.   08/30/2018 at Unknown time  . Potassium Chloride ER 20 MEQ TBCR Take 1 tablet by mouth 2 (two) times daily. 60 tablet 0 08/30/2018 at Unknown time  . potassium chloride SA (K-DUR,KLOR-CON) 20 MEQ tablet Take 20 mEq by mouth 2 (two) times daily.  0 08/30/2018 at Unknown time  . vitamin B-12 (CYANOCOBALAMIN) 1000 MCG tablet Take 1,000 mcg by mouth daily.    08/30/2018 at Unknown time  . aspirin EC 81 MG tablet Take 81 mg by mouth daily.   08/29/2018   Allergies  Allergen Reactions  . Codeine Nausea Only and Other (See Comments)    fever   Past Medical History:  Diagnosis Date  . Anemia   . Carotid body tumor ([place])    left  . Family history of colon cancer   . Hypertension   . Hypothyroid   . Pyelonephritis   . Sleep apnea    Stop Bang score of 4. has never been tested.  . Vertigo   . Vitamin B 12 deficiency    Past Surgical History:  Procedure Laterality Date  . ABDOMINAL HYSTERECTOMY    . CATARACT EXTRACTION W/ INTRAOCULAR LENS  IMPLANT, BILATERAL    . CHOLECYSTECTOMY N/A 04/15/2015   Procedure: LAPAROSCOPIC CHOLECYSTECTOMY;  Surgeon: Aviva Signs, MD;  Location: [facility];  Service: General;  Laterality: N/A;  . COLONOSCOPY  2012   [place]  . COLONOSCOPY WITH PROPOFOL N/A 02/03/2016   Procedure: COLONOSCOPY WITH PROPOFOL;  Surgeon: Robert Bellow, MD;  Location: [facility] ENDOSCOPY;  Service: Endoscopy;  Laterality: N/A;  . EYE SURGERY Bilateral    cataract extractions  . IR ANGIO EXTERNAL CAROTID SEL EXT CAROTID UNI L MOD SED  10/26/2017  . IR ANGIO INTRA EXTRACRAN SEL COM CAROTID INNOMINATE BILAT MOD SED  10/26/2017  . IR ANGIO VERTEBRAL SEL VERTEBRAL UNI R MOD SED  10/26/2017  . IR ANGIOGRAM EXTREMITY LEFT  10/26/2017  . IR ANGIOGRAM FOLLOW UP STUDY  10/26/2017  . IR NEURO EACH ADD'L AFTER BASIC UNI LEFT (MS)  10/26/2017  . IR RADIOLOGIST EVAL & MGMT  09/21/2017  . IR TRANSCATH/EMBOLIZ  10/26/2017  . MASS EXCISION Left 10/27/2017   Procedure: EXCISION CAROTID BODY TUMOR;  Surgeon:  Melida Quitter, MD;  Location: [facility];  Service: ENT;  Laterality: Left;  . RADIOLOGY WITH ANESTHESIA N/A 10/26/2017   Procedure: EMBOLIZATION;  Surgeon: Luanne Bras, MD;  Location: [facility];  Service: Radiology;  Laterality: N/A;  . UPPER GASTROINTESTINAL ENDOSCOPY  2014  . VESICOVAGINAL FISTULA CLOSURE W/ TAH     Social History   Socioeconomic History  . Marital status: Widowed    Spouse name: Not on file  . Number of children: Not on file  . Years of education: Not on file  . Highest education level: Not on file  Occupational History  . 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 Financial resource strain: Not on file  . Food insecurity:    Worry: Not on file    Inability: Not on file  . Transportation  needs:    Medical: Not on file    Non-medical: Not on file  Tobacco Use  . Smoking status: Never Smoker  . Smokeless tobacco: Never Used  Substance and Sexual Activity  . Alcohol use: No    Alcohol/week: 0.0 standard drinks  . Drug use: No  . Sexual activity: Yes    Birth control/protection: Surgical  Lifestyle  . Physical activity:    Days per week: Not on file    Minutes per session: Not on file  . Stress: Not on file  Relationships  . Social connections:    Talks on phone: Not on file    Gets together: Not on file    Attends religious service: Not on file    Active member of club or organization: Not on file    Attends meetings of clubs or organizations: Not on file    Relationship status: Not on file  . Intimate partner violence:    Fear of current or ex partner: Not on file    Emotionally abused: Not on file    Physically abused: Not on file    Forced sexual activity: Not on file  Other Topics Concern  . Not on file  Social History Narrative   ** Merged History Encounter **       Social History   Social History Narrative   ** Merged History Encounter **         ROS: Negative.     PE: HEENT: Negative. Lungs: Clear. Cardio: RRForest Gleason Davin Archuletta 08/31/2018   Assessment/Plan:  Proceed with planned exam under anesthesia, sigmoidoscopy, hemorrhoid excision.

## 2018-08-31 NOTE — Anesthesia Procedure Notes (Signed)
Procedure Name: LMA Insertion Date/Time: 08/31/2018 9:18 AM Performed by: Aline Brochure, CRNA Pre-anesthesia Checklist: Patient identified, Emergency Drugs available, Suction available and Patient being monitored Patient Re-evaluated:Patient Re-evaluated prior to induction Oxygen Delivery Method: Circle system utilized Preoxygenation: Pre-oxygenation with 100% oxygen Induction Type: IV induction Ventilation: Mask ventilation without difficulty LMA: LMA inserted LMA Size: 3.5 Number of attempts: 1 Placement Confirmation: positive ETCO2 and breath sounds checked- equal and bilateral Tube secured with: Tape Dental Injury: Teeth and Oropharynx as per pre-operative assessment

## 2018-08-31 NOTE — Anesthesia Postprocedure Evaluation (Signed)
Anesthesia Post Note  Patient: Heather Cobb  Procedure(s) Performed: HEMORRHOIDECTOMY (N/A ) SIGMOIDOSCOPY-IN  OR (N/A )  Patient location during evaluation: PACU Anesthesia Type: General Level of consciousness: awake and alert Pain management: pain level controlled Vital Signs Assessment: post-procedure vital signs reviewed and stable Respiratory status: spontaneous breathing and respiratory function stable Cardiovascular status: stable Anesthetic complications: no     Last Vitals:  Vitals:   08/31/18 0740 08/31/18 0959  BP: (!) 161/76 (!) 142/89  Pulse: (!) 57 94  Resp: 16 16  Temp: 36.5 C (!) 36.2 C  SpO2: 100% 100%    Last Pain:  Vitals:   08/31/18 0740  TempSrc: Oral  PainSc: 0-No pain                 Jayden Kratochvil K

## 2018-08-31 NOTE — Progress Notes (Signed)
Removed packing from rectum @ 10:30 per Dr. Bary Castilla.

## 2018-08-31 NOTE — Anesthesia Post-op Follow-up Note (Signed)
Anesthesia QCDR form completed.        

## 2018-08-31 NOTE — Op Note (Signed)
Preoperative diagnosis: Rectal bleeding thought secondary to internal hemorrhoids.  Postoperative diagnosis: Same.  Operative procedure: Rigid sigmoidoscopy, anoscopy, hemorrhoidectomy.  Operating Surgeon: Hervey Ard MD.  Anesthesia: General by LMA, Marcaine 0.25% with 1-200,000's of epinephrine, 30 cc  Estimated blood loss: 5-10 cc.  Clinical note: This 77 year old woman has had intermittent rectal bleeding.  Previous failed hemorrhoid banding.  While her hemorrhoids are small she still has episodes of bleeding with loose stools (presently using Linzess for chronic constipation).  She is felt to be a candidate for hemorrhoidectomy.  The patient received antibiotics prior to the procedure.  She underwent general anesthesia without difficulty.  Operative note: With the patient under adequate general anesthesia the perineum was prepped with Betadine solution and draped.  Local anesthesia was infiltrated for postoperative comfort.  The rigid sigmoidoscope was advanced to 16 cm at which point an acute angulation prevented further safe passage.  No mucosal lesions.  Anoscopy was completed.  Initial attempts to place the standard "bullet" were unsuccessful due to a tight anal opening.  The bivalve speculum was placed.  Small hemorrhoids noted at the 2, 5 and 7 o'clock position.  These were removed with the harmonic scalpel.  Hemostasis was with 3-0 chromic figure-of-eight sutures.  Only a small amount of skin was removed on the posterior aspect of the anus.  A Vaseline gauze pack was placed for removal in the recovery room.  The patient tolerated the procedure well and was taken recovery in stable condition.

## 2018-08-31 NOTE — Anesthesia Preprocedure Evaluation (Signed)
Anesthesia Evaluation  Patient identified by MRN, date of birth, ID band Patient awake    Reviewed: Allergy & Precautions, NPO status , Patient's Chart, lab work & pertinent test results  History of Anesthesia Complications Negative for: history of anesthetic complications  Airway Mallampati: II       Dental  (+) Upper Dentures   Pulmonary sleep apnea (undiagnosed at this time) , neg COPD,           Cardiovascular hypertension, Pt. on medications (-) Past MI and (-) CHF (-) dysrhythmias (-) Valvular Problems/Murmurs     Neuro/Psych neg Seizures    GI/Hepatic Neg liver ROS, neg GERD  ,  Endo/Other  neg diabetesHypothyroidism   Renal/GU negative Renal ROS     Musculoskeletal   Abdominal   Peds  Hematology  (+) anemia ,   Anesthesia Other Findings   Reproductive/Obstetrics                             Anesthesia Physical Anesthesia Plan  ASA: II  Anesthesia Plan: General   Post-op Pain Management:    Induction: Intravenous  PONV Risk Score and Plan: 3 and Dexamethasone, Ondansetron and Midazolam  Airway Management Planned: LMA  Additional Equipment:   Intra-op Plan:   Post-operative Plan:   Informed Consent: I have reviewed the patients History and Physical, chart, labs and discussed the procedure including the risks, benefits and alternatives for the proposed anesthesia with the patient or authorized representative who has indicated his/her understanding and acceptance.     Plan Discussed with:   Anesthesia Plan Comments:         Anesthesia Quick Evaluation

## 2018-08-31 NOTE — Transfer of Care (Signed)
Immediate Anesthesia Transfer of Care Note  Patient: [name]  Procedure(s) Performed: HEMORRHOIDECTOMY (N/A ) SIGMOIDOSCOPY-IN  OR (N/A )  Patient Location: PACU  Anesthesia Type:General  Level of Consciousness: sedated  Airway & Oxygen Therapy: Patient connected to face mask oxygen  Post-op Assessment: Report given to RN and Post -op Vital signs reviewed and stable  Post vital signs: stable  Last Vitals:  Vitals Value Taken Time  BP 142/89 08/31/2018  9:59 AM  Temp    Pulse 94 08/31/2018  9:59 AM  Resp 16 08/31/2018  9:59 AM  SpO2 100 % 08/31/2018  9:59 AM    Last Pain:  Vitals:   08/31/18 0740  TempSrc: Oral  PainSc: 0-No pain         Complications: No apparent anesthesia complications

## 2018-09-03 ENCOUNTER — Encounter: Payer: Self-pay | Admitting: General Surgery

## 2018-09-03 LAB — SURGICAL PATHOLOGY

## 2018-09-18 ENCOUNTER — Ambulatory Visit (INDEPENDENT_AMBULATORY_CARE_PROVIDER_SITE_OTHER): Payer: Medicare HMO | Admitting: Surgery

## 2018-09-18 ENCOUNTER — Other Ambulatory Visit: Payer: Self-pay

## 2018-09-18 ENCOUNTER — Encounter: Payer: Self-pay | Admitting: General Surgery

## 2018-09-18 ENCOUNTER — Encounter: Payer: Self-pay | Admitting: Surgery

## 2018-09-18 VITALS — BP 111/65 | HR 53 | Temp 97.9°F | Ht 63.5 in | Wt 152.8 lb

## 2018-09-18 DIAGNOSIS — Z4889 Encounter for other specified surgical aftercare: Secondary | ICD-10-CM

## 2018-09-18 NOTE — Patient Instructions (Signed)
Patient is to return to the office as needed.  Call the office with any questions or concerns. 

## 2018-09-18 NOTE — Progress Notes (Signed)
Surgical Clinic Progress/Follow-up Note   HPI:  77 y.o. Female presents to clinic for post-op follow-up 18 Days s/p hemorrhoidectomy ([facility], 08/31/2018) for bleeding hemorrhoids. Patient denies having experienced any post-operative pain, says she took one pain medication tablet when resuming her chronic Linzess for chronic constipation for anticipation of pain and has not since taken another. Her blood per rectum also completely resolved several days after her surgery with only mild peri-anal "itching", and she has been tolerating regular diet with +flatus and normal BM's, denies N/V, fever/chills, CP, or SOB.  Review of Systems:  Constitutional: denies fever/chills  Respiratory: denies shortness of breath, wheezing  Cardiovascular: denies chest pain, palpitations  Gastrointestinal: abdominal pain, N/V, and bowel function as per interval history Skin: Denies any other rashes or skin discolorations  Vital Signs:  BP 111/65   Pulse (!) 53   Temp 97.9 F (36.6 C) (Temporal)   Ht 5' 3.5" (1.613 m)   Wt 152 lb 12.8 oz (69.3 kg)   SpO2 93%   BMI 26.64 kg/m    Physical Exam:  Constitutional:  -- Normal body habitus  -- Awake, alert, and oriented x3  Pulmonary:  -- No crackles -- Equal breath sounds bilaterally -- Breathing non-labored at rest Cardiovascular:  -- S1, S2 present  -- No pericardial rubs  Gastrointestinal:  -- Soft and non-distended, non-tender to palpation, no guarding/rebound tenderness -- No abdominal masses appreciated, pulsatile or otherwise Anorectal: -- Anal sphincter tone WNL -- Post-surgical anorectal wounds non-tender to palpation and healing well  -- No blood per rectum appreciated, no surrounding erythema or drainage Musculoskeletal / Integumentary:  -- Wounds or skin discoloration: None appreciated except post-surgical incisions as described above (Anorectal) -- Extremities: B/L UE and LE FROM, hands and feet warm, no edema   Imaging: No new  pertinent imaging available for review  Assessment:  77 y.o. yo Female with a problem list including...  Patient Active Problem List   Diagnosis Date Noted  . Genetic testing 02/06/2018  . Family history of colon cancer   . Carotid body tumor ([place]) 10/26/2017  . Paraganglioma ([place]) 08/21/2017  . Encounter for screening colonoscopy 12/14/2015  . Rectal bleeding 11/26/2015  . Constipation 11/26/2015  . Anemia, iron deficiency 02/03/2013  . Vitamin B12 deficiency 02/03/2013  . Essential hypertension, benign 02/02/2013  . Anemia 02/02/2013  . Dehydration 02/02/2013  . Unspecified hypothyroidism 02/02/2013  . Pyelonephritis 02/01/2013    presents to clinic for post-op follow-up evaluation, doing very well 18 Days s/p hemorrhoidectomy ([facility], 08/31/2018) for bleeding hemorrhoids.  Plan:              - advance diet as tolerated              - gradually resume all activities without restrictions             - okay to submerge incisions under water (baths, swimming) prn             - return to clinic as needed and for routine follow-up screening colonoscopy as previously advised  - instructed to call office if any questions or concerns  All of the above recommendations were discussed with the patient and patient's family, and all of patient's and family's questions were answered to their expressed satisfaction.  -- Marilynne Drivers Rosana Hoes, MD, [place]: [facility] - Partnering for exceptional care. Office: [phone]

## 2019-02-26 ENCOUNTER — Ambulatory Visit (HOSPITAL_COMMUNITY)
Admission: RE | Admit: 2019-02-26 | Discharge: 2019-02-26 | Disposition: A | Discharge status: Home / Self Care | Payer: Medicare HMO | Source: Ambulatory Visit | Attending: Nurse Practitioner | Admitting: Nurse Practitioner

## 2019-02-26 ENCOUNTER — Inpatient Hospital Stay (HOSPITAL_COMMUNITY): Payer: Medicare HMO | Attending: Hematology

## 2019-02-26 ENCOUNTER — Other Ambulatory Visit: Payer: Self-pay

## 2019-02-26 DIAGNOSIS — K5900 Constipation, unspecified: Secondary | ICD-10-CM | POA: Diagnosis not present

## 2019-02-26 DIAGNOSIS — D447 Neoplasm of uncertain behavior of aortic body and other paraganglia: Secondary | ICD-10-CM

## 2019-02-26 DIAGNOSIS — D446 Neoplasm of uncertain behavior of carotid body: Secondary | ICD-10-CM | POA: Diagnosis present

## 2019-02-26 DIAGNOSIS — N189 Chronic kidney disease, unspecified: Secondary | ICD-10-CM | POA: Diagnosis not present

## 2019-02-26 DIAGNOSIS — Z9049 Acquired absence of other specified parts of digestive tract: Secondary | ICD-10-CM | POA: Insufficient documentation

## 2019-02-26 LAB — CBC WITH DIFFERENTIAL/PLATELET
Abs Immature Granulocytes: 0.01 10*3/uL (ref 0.00–0.07)
Basophils Absolute: 0 10*3/uL (ref 0.0–0.1)
Basophils Relative: 1 %
Eosinophils Absolute: 0.2 10*3/uL (ref 0.0–0.5)
Eosinophils Relative: 5 %
HCT: 39.3 % (ref 36.0–46.0)
Hemoglobin: 12.2 g/dL (ref 12.0–15.0)
Immature Granulocytes: 0 %
Lymphocytes Relative: 41 %
Lymphs Abs: 1.9 10*3/uL (ref 0.7–4.0)
MCH: 28.4 pg (ref 26.0–34.0)
MCHC: 31 g/dL (ref 30.0–36.0)
MCV: 91.4 fL (ref 80.0–100.0)
Monocytes Absolute: 0.5 10*3/uL (ref 0.1–1.0)
Monocytes Relative: 10 %
Neutro Abs: 2 10*3/uL (ref 1.7–7.7)
Neutrophils Relative %: 43 %
Platelets: 207 10*3/uL (ref 150–400)
RBC: 4.3 MIL/uL (ref 3.87–5.11)
RDW: 13.5 % (ref 11.5–15.5)
WBC: 4.6 10*3/uL (ref 4.0–10.5)
nRBC: 0 % (ref 0.0–0.2)

## 2019-02-26 LAB — COMPREHENSIVE METABOLIC PANEL
ALT: 13 U/L (ref 0–44)
AST: 16 U/L (ref 15–41)
Albumin: 4.1 g/dL (ref 3.5–5.0)
Alkaline Phosphatase: 48 U/L (ref 38–126)
Anion gap: 6 (ref 5–15)
BUN: 24 mg/dL — ABNORMAL HIGH (ref 8–23)
CO2: 32 mmol/L (ref 22–32)
Calcium: 9.6 mg/dL (ref 8.9–10.3)
Chloride: 104 mmol/L (ref 98–111)
Creatinine, Ser: 1.26 mg/dL — ABNORMAL HIGH (ref 0.44–1.00)
GFR calc Af Amer: 47 mL/min — ABNORMAL LOW (ref >60)
GFR calc non Af Amer: 41 mL/min — ABNORMAL LOW (ref >60)
Glucose, Bld: 104 mg/dL — ABNORMAL HIGH (ref 70–99)
Potassium: 3.4 mmol/L — ABNORMAL LOW (ref 3.5–5.1)
Sodium: 142 mmol/L (ref 135–145)
Total Bilirubin: 0.7 mg/dL (ref 0.3–1.2)
Total Protein: 7.4 g/dL (ref 6.5–8.1)

## 2019-02-26 MED ORDER — IOHEXOL 300 MG/ML  SOLN
75.0000 mL | Freq: Once | INTRAMUSCULAR | Status: AC | PRN
  Administered 2019-02-26: 10:00:00 75 mL via INTRAVENOUS

## 2019-03-04 ENCOUNTER — Other Ambulatory Visit: Payer: Self-pay

## 2019-03-05 ENCOUNTER — Inpatient Hospital Stay (HOSPITAL_BASED_OUTPATIENT_CLINIC_OR_DEPARTMENT_OTHER): Payer: Medicare HMO | Admitting: Hematology

## 2019-03-05 ENCOUNTER — Encounter (HOSPITAL_COMMUNITY): Payer: Self-pay | Admitting: Hematology

## 2019-03-05 VITALS — BP 132/70 | HR 62 | Temp 98.2°F | Resp 16 | Wt 159.0 lb

## 2019-03-05 DIAGNOSIS — D446 Neoplasm of uncertain behavior of carotid body: Secondary | ICD-10-CM

## 2019-03-05 DIAGNOSIS — Z79899 Other long term (current) drug therapy: Secondary | ICD-10-CM | POA: Diagnosis not present

## 2019-03-05 DIAGNOSIS — K5900 Constipation, unspecified: Secondary | ICD-10-CM | POA: Diagnosis not present

## 2019-03-05 DIAGNOSIS — N189 Chronic kidney disease, unspecified: Secondary | ICD-10-CM | POA: Diagnosis not present

## 2019-03-05 DIAGNOSIS — Z9049 Acquired absence of other specified parts of digestive tract: Secondary | ICD-10-CM

## 2019-03-05 DIAGNOSIS — D447 Neoplasm of uncertain behavior of aortic body and other paraganglia: Secondary | ICD-10-CM

## 2019-03-05 NOTE — Assessment & Plan Note (Signed)
1.  Paraganglioma of the left carotid artery: - Surgical pathology report on 10/27/2017 reports paraganglioma of left carotid body biopsy.  Margin status was not mentioned. -Patient had work-up for 24-hour urine metanephrines and normetanephrines in December which were within normal limits. - CT scan of the neck reviewed by me on 02/07/2018 was negative except postsurgical changes.  - Genetic testing for SDH and BHL was negative.  It showed VUS in KIT and STK11. -24-hour urine metanephrines were stable. -I reviewed CT of the neck dated 02/26/2019 which did not show any recurrence.  Physical exam is within normal limits. -We reviewed her blood work which is also within normal limits. -I will see her back in 6 months for follow-up with repeat blood work and physical exam.  We will do CT scans yearly for a total of 2 years.  I have also phoned and talked to her daughter and explained scan findings and surveillance plan.  2.  CKD: - Her creatinine is elevated mildly at 1.26.  This is been stable. -I reviewed her medication.  Naproxen and Benicar can contribute to mild elevated creatinine. -She was told to avoid nephrotoxins and drink plenty of water.

## 2019-03-05 NOTE — Progress Notes (Signed)
Heather Cobb, [place] [address]   CLINIC:  Medical Oncology/Hematology  PCP:  Sinda Du, [place] [address] [phone]   REASON FOR VISIT: Follow-up for paraganglioma.  CURRENT THERAPY: Observation   INTERVAL HISTORY:  Heather Cobb 78 y.o. female returns for follow-up of paraganglioma of the left carotid artery.  She denies any neck pains.  She has constipation which is controlled with Linzess.  Appetite and energy levels are 100%.  Denies any flushing, wheezing or diarrhea.  Denies any nausea, vomiting and other GI symptoms.  Denies any fevers or infections or recent hospitalizations.     REVIEW OF SYSTEMS:  Review of Systems  Gastrointestinal: Positive for constipation.  All other systems reviewed and are negative.    PAST MEDICAL/SURGICAL HISTORY:  Past Medical History:  Diagnosis Date  . Anemia   . Carotid body tumor ([place])    left  . Family history of colon cancer   . Hypertension   . Hypothyroid   . Pyelonephritis   . Rectal bleeding 11/26/2015  . Sleep apnea    Stop Bang score of 4. has never been tested.  . Vertigo   . Vitamin B 12 deficiency    Past Surgical History:  Procedure Laterality Date  . ABDOMINAL HYSTERECTOMY    . CATARACT EXTRACTION W/ INTRAOCULAR LENS  IMPLANT, BILATERAL    . CHOLECYSTECTOMY N/A 04/15/2015   Procedure: LAPAROSCOPIC CHOLECYSTECTOMY;  Surgeon: Aviva Signs, MD;  Location: [facility];  Service: General;  Laterality: N/A;  . COLONOSCOPY  2012   Parachute  . COLONOSCOPY WITH PROPOFOL N/A 02/03/2016   Procedure: COLONOSCOPY WITH PROPOFOL;  Surgeon: Robert Bellow, MD;  Location: [facility] END[facility]COPY;  Service: Endoscopy;  Laterality: N/A;  . EYE SURGERY Bilateral    cataract extractions  . HEMORRHOID SURGERY N/A 08/31/2018   Procedure: HEMORRHOIDECTOMY;  Surgeon: Robert Bellow, MD;  Location: [facility] ORS;  Service: General;  Laterality: N/A;  . IR ANGIO EXTERNAL CAROTID  SEL EXT CAROTID UNI L MOD SED  10/26/2017  . IR ANGIO INTRA EXTRACRAN SEL COM CAROTID INNOMINATE BILAT MOD SED  10/26/2017  . IR ANGIO VERTEBRAL SEL VERTEBRAL UNI R MOD SED  10/26/2017  . IR ANGIOGRAM EXTREMITY LEFT  10/26/2017  . IR ANGIOGRAM FOLLOW UP STUDY  10/26/2017  . IR NEURO EACH ADD'L AFTER BASIC UNI LEFT (MS)  10/26/2017  . IR RADIOLOGIST EVAL & MGMT  09/21/2017  . IR TRANSCATH/EMBOLIZ  10/26/2017  . MASS EXCISION Left 10/27/2017   Procedure: EXCISION CAROTID BODY TUMOR;  Surgeon: Melida Quitter, MD;  Location: [facility];  Service: ENT;  Laterality: Left;  . RADIOLOGY WITH ANESTHESIA N/A 10/26/2017   Procedure: EMBOLIZATION;  Surgeon: Luanne Bras, MD;  Location: [facility];  Service: Radiology;  Laterality: N/A;  . SIGMOIDOSCOPY N/A 08/31/2018   Procedure: SIGMOIDOSCOPY-IN  OR;  Surgeon: Robert Bellow, MD;  Location: [facility] ORS;  Service: General;  Laterality: N/A;  . UPPER GASTROINTESTINAL ENDOSCOPY  2014  . VESICOVAGINAL FISTULA CLOSURE W/ TAH       SOCIAL HISTORY:  Social History   Socioeconomic History  . Marital status: Widowed    Spouse name: Not on file  . Number of children: Not on file  . Years of education: Not on file  . Highest education level: Not on file  Occupational History  . 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 Financial resource strain: Not on file  .  Food insecurity    Worry: Not on file    Inability: Not on file  . Transportation needs    Medical: Not on file    Non-medical: Not on file  Tobacco Use  . Smoking status: Never Smoker  . Smokeless tobacco: Never Used  Substance and Sexual Activity  . Alcohol use: No    Alcohol/week: 0.0 standard drinks  . Drug use: No  . Sexual activity: Yes    Birth control/protection: Surgical  Lifestyle  . Physical activity    Days per week: Not on file    Minutes per session: Not on file  . Stress: Not on file  Relationships  . Social Herbalist on phone: Not on file    Gets  together: Not on file    Attends religious service: Not on file    Active member of club or organization: Not on file    Attends meetings of clubs or organizations: Not on file    Relationship status: Not on file  . Intimate partner violence    Fear of current or ex partner: Not on file    Emotionally abused: Not on file    Physically abused: Not on file    Forced sexual activity: Not on file  Other Topics Concern  . Not on file  Social History Narrative   ** Merged History Encounter **        FAMILY HISTORY:  Family History  Problem Relation Age of Onset  . Asthma Mother   . Kidney failure Mother   . Hypertension Daughter   . Colon cancer Father        dx in his 2s  . Colon cancer Maternal Uncle   . Heart attack Maternal Grandmother   . Cancer Neg Hx     CURRENT MEDICATIONS:  Outpatient Encounter Medications as of 03/05/2019  Medication Sig  . aspirin EC 81 MG tablet Take 81 mg by mouth daily.  . cholecalciferol (VITAMIN D) 1000 UNITS tablet Take 1,000 Units by mouth daily.  Marland Kitchen estradiol (CLIMARA - DOSED IN MG/24 HR) 0.0375 mg/24hr patch Place 0.0375 mg onto the skin every Friday.  . ferrous sulfate 325 (65 FE) MG tablet Take 325 mg by mouth daily with breakfast.  . hydrochlorothiazide (HYDRODIURIL) 12.5 MG tablet Take 12.5 mg by mouth daily.  Marland Kitchen levothyroxine (SYNTHROID, LEVOTHROID) 88 MCG tablet Take 88 mcg by mouth daily before breakfast.   . linaclotide (LINZESS) 145 MCG CAPS capsule Take 145 mcg by mouth daily as needed (for constipation).   . naproxen sodium (ALEVE) 220 MG tablet Take 440 mg by mouth daily as needed (for joint pain).   . nystatin-triamcinolone (MYCOLOG II) cream Apply 1 application topically 2 (two) times daily as needed (for yeast infection).  Marland Kitchen olmesartan (BENICAR) 40 MG tablet Take 40 mg by mouth daily.  . vitamin B-12 (CYANOCOBALAMIN) 1000 MCG tablet Take 1,000 mcg by mouth daily.   . [DISCONTINUED] ondansetron (ZOFRAN) 4 MG tablet Take 1 tablet  (4 mg total) by mouth daily as needed for nausea or vomiting.  . [DISCONTINUED] Potassium Chloride ER 20 MEQ TBCR Take 1 tablet by mouth 2 (two) times daily.  . [DISCONTINUED] potassium chloride SA (K-DUR,KLOR-CON) 20 MEQ tablet Take 20 mEq by mouth 2 (two) times daily.  . [DISCONTINUED] traMADol (ULTRAM) 50 MG tablet Take 1 tablet (50 mg total) by mouth every 4 (four) hours as needed.   No facility-administered encounter medications on file as of 03/05/2019.  ALLERGIES:  Allergies  Allergen Reactions  . Codeine Nausea Only and Other (See Comments)    fever     PHYSICAL EXAM:  ECOG Performance status: 1  Vitals:   03/05/19 1100  BP: 132/70  Pulse: 62  Resp: 16  Temp: 98.2 F (36.8 C)  SpO2: 95%   Filed Weights   03/05/19 1100  Weight: 159 lb (72.1 kg)    Physical Exam Constitutional:      Appearance: She is well-developed.  Cardiovascular:     Rate and Rhythm: Normal rate and regular rhythm.     Heart sounds: Normal heart sounds.  Pulmonary:     Effort: Pulmonary effort is normal.     Breath sounds: Normal breath sounds.  Abdominal:     General: There is no distension.     Palpations: Abdomen is soft. There is no mass.  Musculoskeletal: Normal range of motion.  Lymphadenopathy:     Cervical: No cervical adenopathy.  Skin:    General: Skin is warm.  Neurological:     Mental Status: She is alert and oriented to person, place, and time.  Psychiatric:        Mood and Affect: Mood normal.        Behavior: Behavior normal.      LABORATORY DATA:  I have reviewed the labs as listed.  CBC    Component Value Date/Time   WBC 4.6 02/26/2019 0919   RBC 4.30 02/26/2019 0919   HGB 12.2 02/26/2019 0919   HCT 39.3 02/26/2019 0919   PLT 207 02/26/2019 0919   MCV 91.4 02/26/2019 0919   MCH 28.4 02/26/2019 0919   MCHC 31.0 02/26/2019 0919   RDW 13.5 02/26/2019 0919   LYMPHSABS 1.9 02/26/2019 0919   MONOABS 0.5 02/26/2019 0919   EOSABS 0.2 02/26/2019 0919    BASOSABS 0.0 02/26/2019 0919   CMP Latest Ref Rng & Units 02/26/2019 08/31/2018 08/22/2018  Glucose 70 - 99 mg/dL 104(H) 101(H) 103(H)  BUN 8 - 23 mg/dL 24(H) - 20  Creatinine 0.44 - 1.00 mg/dL 1.26(H) - 1.11(H)  Sodium 135 - 145 mmol/L 142 142 142  Potassium 3.5 - 5.1 mmol/L 3.4(L) 3.7 3.2(L)  Chloride 98 - 111 mmol/L 104 - 102  CO2 22 - 32 mmol/L 32 - 32  Calcium 8.9 - 10.3 mg/dL 9.6 - 9.6  Total Protein 6.5 - 8.1 g/dL 7.4 - -  Total Bilirubin 0.3 - 1.2 mg/dL 0.7 - -  Alkaline Phos 38 - 126 U/L 48 - -  AST 15 - 41 U/L 16 - -  ALT 0 - 44 U/L 13 - -         ASSESSMENT & PLAN:   Paraganglioma ([facility]) 1.  Paraganglioma of the left carotid artery: - Surgical pathology report on 10/27/2017 reports paraganglioma of left carotid body biopsy.  Margin status was not mentioned. -Patient had work-up for 24-hour urine metanephrines and normetanephrines in December which were within normal limits. - CT scan of the neck reviewed by me on 02/07/2018 was negative except postsurgical changes.  - Genetic testing for SDH and BHL was negative.  It showed VUS in KIT and STK11. -24-hour urine metanephrines were stable. -I reviewed CT of the neck dated 02/26/2019 which did not show any recurrence.  Physical exam is within normal limits. -We reviewed her blood work which is also within normal limits. -I will see her back in 6 months for follow-up with repeat blood work and physical exam.  We will do CT  scans yearly for a total of 2 years.  I have also phoned and talked to her daughter and explained scan findings and surveillance plan.  2.  CKD: - Her creatinine is elevated mildly at 1.26.  This is been stable. -I reviewed her medication.  Naproxen and Benicar can contribute to mild elevated creatinine. -She was told to avoid nephrotoxins and drink plenty of water.      Orders placed this encounter:  Orders Placed This Encounter  Procedures  . CBC with Differential/Platelet  . Comprehensive metabolic  panel      Derek Jack, MD [place] [phone]

## 2019-03-05 NOTE — Patient Instructions (Signed)
[facility] at [facility] Discharge Instructions  You were seen today by Dr. Katragadda. He went over your recent lab results. He will see you back in 6 months for labs and follow up.   Thank you for choosing [facility] at [facility] to provide your oncology and hematology care.  To afford each patient quality time with our provider, please arrive at least 15 minutes before your scheduled appointment time.   If you have a lab appointment with the Cancer Center please come in thru the  Main Entrance and check in at the main information desk  You need to re-schedule your appointment should you arrive 10 or more minutes late.  We strive to give you quality time with our providers, and arriving late affects you and other patients whose appointments are after yours.  Also, if you no show three or more times for appointments you may be dismissed from the clinic at the providers discretion.     Again, thank you for choosing [facility].  Our hope is that these requests will decrease the amount of time that you wait before being seen by our physicians.       _____________________________________________________________  Should you have questions after your visit to [facility], please contact our office at [phone] between the hours of 8:00 a.m. and 4:30 p.m.  Voicemails left after 4:00 p.m. will not be returned until the following business day.  For prescription refill requests, have your pharmacy contact our office and allow 72 hours.    Cancer Center Support Programs:   > Cancer Support Group  2nd Tuesday of the month 1pm-2pm, Journey Room    

## 2019-08-29 ENCOUNTER — Other Ambulatory Visit: Payer: Self-pay

## 2019-08-29 ENCOUNTER — Inpatient Hospital Stay (HOSPITAL_COMMUNITY): Payer: Medicare HMO | Attending: Hematology

## 2019-08-29 DIAGNOSIS — D447 Neoplasm of uncertain behavior of aortic body and other paraganglia: Secondary | ICD-10-CM | POA: Insufficient documentation

## 2019-08-29 LAB — CBC WITH DIFFERENTIAL/PLATELET
Abs Immature Granulocytes: 0.01 10*3/uL (ref 0.00–0.07)
Basophils Absolute: 0 10*3/uL (ref 0.0–0.1)
Basophils Relative: 0 %
Eosinophils Absolute: 0.3 10*3/uL (ref 0.0–0.5)
Eosinophils Relative: 5 %
HCT: 40.6 % (ref 36.0–46.0)
Hemoglobin: 12.2 g/dL (ref 12.0–15.0)
Immature Granulocytes: 0 %
Lymphocytes Relative: 32 %
Lymphs Abs: 1.7 10*3/uL (ref 0.7–4.0)
MCH: 27.9 pg (ref 26.0–34.0)
MCHC: 30 g/dL (ref 30.0–36.0)
MCV: 92.9 fL (ref 80.0–100.0)
Monocytes Absolute: 0.5 10*3/uL (ref 0.1–1.0)
Monocytes Relative: 10 %
Neutro Abs: 2.9 10*3/uL (ref 1.7–7.7)
Neutrophils Relative %: 53 %
Platelets: 233 10*3/uL (ref 150–400)
RBC: 4.37 MIL/uL (ref 3.87–5.11)
RDW: 14 % (ref 11.5–15.5)
WBC: 5.4 10*3/uL (ref 4.0–10.5)
nRBC: 0 % (ref 0.0–0.2)

## 2019-08-29 LAB — COMPREHENSIVE METABOLIC PANEL
ALT: 13 U/L (ref 0–44)
AST: 16 U/L (ref 15–41)
Albumin: 4 g/dL (ref 3.5–5.0)
Alkaline Phosphatase: 57 U/L (ref 38–126)
Anion gap: 12 (ref 5–15)
BUN: 20 mg/dL (ref 8–23)
CO2: 29 mmol/L (ref 22–32)
Calcium: 9.9 mg/dL (ref 8.9–10.3)
Chloride: 101 mmol/L (ref 98–111)
Creatinine, Ser: 1.35 mg/dL — ABNORMAL HIGH (ref 0.44–1.00)
GFR calc Af Amer: 43 mL/min — ABNORMAL LOW (ref >60)
GFR calc non Af Amer: 38 mL/min — ABNORMAL LOW (ref >60)
Glucose, Bld: 97 mg/dL (ref 70–99)
Potassium: 3.5 mmol/L (ref 3.5–5.1)
Sodium: 142 mmol/L (ref 135–145)
Total Bilirubin: 0.3 mg/dL (ref 0.3–1.2)
Total Protein: 7.7 g/dL (ref 6.5–8.1)

## 2019-09-05 ENCOUNTER — Ambulatory Visit (HOSPITAL_COMMUNITY): Payer: Medicare HMO | Admitting: Hematology

## 2019-10-01 ENCOUNTER — Other Ambulatory Visit: Payer: Self-pay

## 2019-10-02 ENCOUNTER — Inpatient Hospital Stay (HOSPITAL_COMMUNITY): Payer: Medicare HMO | Attending: Hematology | Admitting: Hematology

## 2019-10-02 VITALS — BP 131/74 | HR 68 | Temp 97.4°F | Resp 18 | Wt 156.1 lb

## 2019-10-02 DIAGNOSIS — M2550 Pain in unspecified joint: Secondary | ICD-10-CM | POA: Diagnosis not present

## 2019-10-02 DIAGNOSIS — N189 Chronic kidney disease, unspecified: Secondary | ICD-10-CM | POA: Insufficient documentation

## 2019-10-02 DIAGNOSIS — E039 Hypothyroidism, unspecified: Secondary | ICD-10-CM | POA: Diagnosis not present

## 2019-10-02 DIAGNOSIS — R7989 Other specified abnormal findings of blood chemistry: Secondary | ICD-10-CM | POA: Diagnosis not present

## 2019-10-02 DIAGNOSIS — I129 Hypertensive chronic kidney disease with stage 1 through stage 4 chronic kidney disease, or unspecified chronic kidney disease: Secondary | ICD-10-CM | POA: Insufficient documentation

## 2019-10-02 DIAGNOSIS — Z79899 Other long term (current) drug therapy: Secondary | ICD-10-CM | POA: Insufficient documentation

## 2019-10-02 DIAGNOSIS — Z7982 Long term (current) use of aspirin: Secondary | ICD-10-CM | POA: Diagnosis not present

## 2019-10-02 DIAGNOSIS — D447 Neoplasm of uncertain behavior of aortic body and other paraganglia: Secondary | ICD-10-CM | POA: Diagnosis not present

## 2019-10-02 DIAGNOSIS — Z8249 Family history of ischemic heart disease and other diseases of the circulatory system: Secondary | ICD-10-CM | POA: Insufficient documentation

## 2019-10-02 DIAGNOSIS — D446 Neoplasm of uncertain behavior of carotid body: Secondary | ICD-10-CM

## 2019-10-02 NOTE — Assessment & Plan Note (Signed)
1.  Paraganglioma of the left carotid artery: - Surgical pathology report on 10/27/2017 reports paraganglioma of left carotid body biopsy.  Margin status was not mentioned. -Patient had work-up for 24-hour urine metanephrines and normetanephrines in December which were within normal limits. - CT scan of the neck reviewed by me on 02/07/2018 was negative except postsurgical changes.  - Genetic testing for SDH and BHL was negative.  It showed VUS in KIT and STK11. -24-hour urine metanephrines were stable. -I reviewed CT of the neck dated 02/26/2019 which did not show any recurrence.  Physical exam is within normal limits. -We reviewed her blood work which is also within normal limits. -Physical exam was within normal limits.  Labs are acceptable. -She will return to clinic in 6 months with CT neck.  2.  CKD: - Her creatinine is elevated mildly at 1.26.  This is been stable. -I reviewed her medication.  Naproxen and Benicar can contribute to mild elevated creatinine. -She was told to avoid nephrotoxins and drink plenty of water.

## 2019-10-02 NOTE — Progress Notes (Signed)
[address] [place], [place] [id number]   CLINIC:  Medical Oncology/Hematology  PCP:  Sinda Du, [place] [address] [phone]   REASON FOR VISIT:  Follow-up for Paraganglioma   CURRENT THERAPY: Clinical surveillance    INTERVAL HISTORY:  [name] 79 y.o. female presents today for follow-up.  Reports overall doing well.  She denies any significant fatigue.  She denies any shortness of breath, chest pain, lightheadedness or dizziness.  She denies any new lymphadenopathy or masses.  She denies any fevers, chills, night sweats.  No change in vision.  No change in bowel habits.  Appetite is stable.  No weight loss.  She is here to discuss surveillance CT scan.    REVIEW OF SYSTEMS:  Review of Systems  Musculoskeletal: Positive for arthralgias.  All other systems reviewed and are negative.    PAST MEDICAL/SURGICAL HISTORY:  Past Medical History:  Diagnosis Date  . Anemia   . Carotid body tumor ([place])    left  . Family history of colon cancer   . Hypertension   . Hypothyroid   . Pyelonephritis   . Rectal bleeding 11/26/2015  . Sleep apnea    Stop Bang score of 4. has never been tested.  . Vertigo   . Vitamin B 12 deficiency    Past Surgical History:  Procedure Laterality Date  . ABDOMINAL HYSTERECTOMY    . CATARACT EXTRACTION W/ INTRAOCULAR LENS  IMPLANT, BILATERAL    . CHOLECYSTECTOMY N/A 04/15/2015   Procedure: LAPAROSCOPIC CHOLECYSTECTOMY;  Surgeon: Aviva Signs, MD;  Location: [facility];  Service: General;  Laterality: N/A;  . COLONOSCOPY  2012   [place]  . COLONOSCOPY WITH PROPOFOL N/A 02/03/2016   Procedure: COLONOSCOPY WITH PROPOFOL;  Surgeon: Robert Bellow, MD;  Location: [facility] ENDOSCOPY;  Service: Endoscopy;  Laterality: N/A;  . EYE SURGERY Bilateral    cataract extractions  . HEMORRHOID SURGERY N/A 08/31/2018   Procedure: HEMORRHOIDECTOMY;  Surgeon: Robert Bellow, MD;  Location: [facility] ORS;   Service: General;  Laterality: N/A;  . IR ANGIO EXTERNAL CAROTID SEL EXT CAROTID UNI L MOD SED  10/26/2017  . IR ANGIO INTRA EXTRACRAN SEL COM CAROTID INNOMINATE BILAT MOD SED  10/26/2017  . IR ANGIO VERTEBRAL SEL VERTEBRAL UNI R MOD SED  10/26/2017  . IR ANGIOGRAM EXTREMITY LEFT  10/26/2017  . IR ANGIOGRAM FOLLOW UP STUDY  10/26/2017  . IR NEURO EACH ADD'L AFTER BASIC UNI LEFT (MS)  10/26/2017  . IR RADIOLOGIST EVAL & MGMT  09/21/2017  . IR TRANSCATH/EMBOLIZ  10/26/2017  . MASS EXCISION Left 10/27/2017   Procedure: EXCISION CAROTID BODY TUMOR;  Surgeon: Melida Quitter, MD;  Location: [place];  Service: ENT;  Laterality: Left;  . RADIOLOGY WITH ANESTHESIA N/A 10/26/2017   Procedure: EMBOLIZATION;  Surgeon: Luanne Bras, MD;  Location: [facility];  Service: Radiology;  Laterality: N/A;  . SIGMOIDOSCOPY N/A 08/31/2018   Procedure: SIGMOIDOSCOPY-IN  OR;  Surgeon: Robert Bellow, MD;  Location: [facility] ORS;  Service: General;  Laterality: N/A;  . UPPER GASTROINTESTINAL ENDOSCOPY  2014  . VESICOVAGINAL FISTULA CLOSURE W/ TAH       SOCIAL HISTORY:  Social History   Socioeconomic History  . Marital status: Widowed    Spouse name: Not on file  . Number of children: Not on file  . Years of education: Not on file  . Highest education level: Not on file  Occupational History  . Occupation: Engineer, manufacturing systems  Comment: retired  Tobacco Use  . Smoking status: Never Smoker  . Smokeless tobacco: Never Used  Substance and Sexual Activity  . Alcohol use: No    Alcohol/week: 0.0 standard drinks  . Drug use: No  . Sexual activity: Yes    Birth control/protection: Surgical  Other Topics Concern  . Not on file  Social History Narrative   ** Merged History Encounter **       Social Determinants of Health   Financial Resource Strain:   . Difficulty of Paying Living Expenses: Not on file  Food Insecurity:   . Worried About Charity fundraiser in the Last Year: Not on file  . Ran Out of Food in the Last Year:  Not on file  Transportation Needs:   . Lack of Transportation (Medical): Not on file  . Lack of Transportation (Non-Medical): Not on file  Physical Activity:   . Days of Exercise per Week: Not on file  . Minutes of Exercise per Session: Not on file  Stress:   . Feeling of Stress : Not on file  Social Connections:   . Frequency of Communication with Friends and Family: Not on file  . Frequency of Social Gatherings with Friends and Family: Not on file  . Attends Religious Services: Not on file  . Active Member of Clubs or Organizations: Not on file  . Attends Archivist Meetings: Not on file  . Marital Status: Not on file  Intimate Partner Violence:   . Fear of Current or Ex-Partner: Not on file  . Emotionally Abused: Not on file  . Physically Abused: Not on file  . Sexually Abused: Not on file    FAMILY HISTORY:  Family History  Problem Relation Age of Onset  . Asthma Mother   . Kidney failure Mother   . Hypertension Daughter   . Colon cancer Father        dx in his 12s  . Colon cancer Maternal Uncle   . Heart attack Maternal Grandmother   . Cancer Neg Hx     CURRENT MEDICATIONS:  Outpatient Encounter Medications as of 10/02/2019  Medication Sig  . aspirin EC 81 MG tablet Take 81 mg by mouth daily.  . cholecalciferol (VITAMIN D) 1000 UNITS tablet Take 1,000 Units by mouth daily.  Marland Kitchen estradiol (CLIMARA - DOSED IN MG/24 HR) 0.0375 mg/24hr patch Place 0.0375 mg onto the skin every Friday.  . ferrous sulfate 325 (65 FE) MG tablet Take 325 mg by mouth daily with breakfast.  . hydrochlorothiazide (HYDRODIURIL) 12.5 MG tablet Take 12.5 mg by mouth daily.  Marland Kitchen levothyroxine (SYNTHROID, LEVOTHROID) 88 MCG tablet Take 88 mcg by mouth daily before breakfast.   . olmesartan (BENICAR) 40 MG tablet Take 40 mg by mouth daily.  . vitamin B-12 (CYANOCOBALAMIN) 1000 MCG tablet Take 1,000 mcg by mouth daily.   Marland Kitchen linaclotide (LINZESS) 145 MCG CAPS capsule Take 145 mcg by mouth daily  as needed (for constipation).   . naproxen sodium (ALEVE) 220 MG tablet Take 440 mg by mouth daily as needed (for joint pain).   . nystatin-triamcinolone (MYCOLOG II) cream Apply 1 application topically 2 (two) times daily as needed (for yeast infection).   No facility-administered encounter medications on file as of 10/02/2019.    ALLERGIES:  Allergies  Allergen Reactions  . Codeine Nausea Only and Other (See Comments)    fever     PHYSICAL EXAM:  ECOG Performance status: 1  Vitals:   10/02/19 1002  BP: 131/74  Pulse: 68  Resp: 18  Temp: (!) 97.4 F (36.3 C)  SpO2: 97%   Filed Weights   10/02/19 1002  Weight: 156 lb 1.6 oz (70.8 kg)    Physical Exam Constitutional:      Appearance: Normal appearance.  HENT:     Head: Normocephalic.     Right Ear: External ear normal.     Left Ear: External ear normal.  Eyes:     Conjunctiva/sclera: Conjunctivae normal.  Cardiovascular:     Rate and Rhythm: Normal rate and regular rhythm.     Pulses: Normal pulses.     Heart sounds: Normal heart sounds.  Pulmonary:     Effort: Pulmonary effort is normal.     Breath sounds: Normal breath sounds.  Abdominal:     General: Bowel sounds are normal.  Musculoskeletal:     Cervical back: Normal range of motion.     Comments: Decreased range of motion  Skin:    General: Skin is warm.  Neurological:     General: No focal deficit present.     Mental Status: She is alert and oriented to person, place, and time.  Psychiatric:        Mood and Affect: Mood normal.        Behavior: Behavior normal.      LABORATORY DATA:  I have reviewed the labs as listed.  CBC    Component Value Date/Time   WBC 5.4 08/29/2019 0929   RBC 4.37 08/29/2019 0929   HGB 12.2 08/29/2019 0929   HCT 40.6 08/29/2019 0929   PLT 233 08/29/2019 0929   MCV 92.9 08/29/2019 0929   MCH 27.9 08/29/2019 0929   MCHC 30.0 08/29/2019 0929   RDW 14.0 08/29/2019 0929   LYMPHSABS 1.7 08/29/2019 0929   MONOABS  0.5 08/29/2019 0929   EOSABS 0.3 08/29/2019 0929   BASOSABS 0.0 08/29/2019 0929   CMP Latest Ref Rng & Units 08/29/2019 02/26/2019 08/31/2018  Glucose 70 - 99 mg/dL 97 104(H) 101(H)  BUN 8 - 23 mg/dL 20 24(H) -  Creatinine 0.44 - 1.00 mg/dL 1.35(H) 1.26(H) -  Sodium 135 - 145 mmol/L 142 142 142  Potassium 3.5 - 5.1 mmol/L 3.5 3.4(L) 3.7  Chloride 98 - 111 mmol/L 101 104 -  CO2 22 - 32 mmol/L 29 32 -  Calcium 8.9 - 10.3 mg/dL 9.9 9.6 -  Total Protein 6.5 - 8.1 g/dL 7.7 7.4 -  Total Bilirubin 0.3 - 1.2 mg/dL 0.3 0.7 -  Alkaline Phos 38 - 126 U/L 57 48 -  AST 15 - 41 U/L 16 16 -  ALT 0 - 44 U/L 13 13 -          ASSESSMENT & PLAN:   Paraganglioma (HCC) 1.  Paraganglioma of the left carotid artery: - Surgical pathology report on 10/27/2017 reports paraganglioma of left carotid body biopsy.  Margin status was not mentioned. -Patient had work-up for 24-hour urine metanephrines and normetanephrines in December which were within normal limits. - CT scan of the neck reviewed by me on 02/07/2018 was negative except postsurgical changes.  - Genetic testing for SDH and BHL was negative.  It showed VUS in KIT and STK11. -24-hour urine metanephrines were stable. -I reviewed CT of the neck dated 02/26/2019 which did not show any recurrence.  Physical exam is within normal limits. -We reviewed her blood work which is also within normal limits. -Physical exam was within normal limits.  Labs are acceptable. -She will  return to clinic in 6 months with CT neck.  2.  CKD: - Her creatinine is elevated mildly at 1.26.  This is been stable. -I reviewed her medication.  Naproxen and Benicar can contribute to mild elevated creatinine. -She was told to avoid nephrotoxins and drink plenty of water.      Orders placed this encounter:  Orders Placed This Encounter  Procedures  . CT SOFT TISSUE NECK [place] [phone]

## 2019-10-15 ENCOUNTER — Ambulatory Visit (INDEPENDENT_AMBULATORY_CARE_PROVIDER_SITE_OTHER)
Admission: RE | Admit: 2019-10-15 | Discharge: 2019-10-15 | Disposition: A | Discharge status: Home / Self Care | Payer: Medicare HMO | Source: Ambulatory Visit | Attending: Family Medicine | Admitting: Family Medicine

## 2019-10-15 ENCOUNTER — Other Ambulatory Visit: Payer: Self-pay

## 2019-10-15 ENCOUNTER — Encounter: Payer: Self-pay | Admitting: Family Medicine

## 2019-10-15 ENCOUNTER — Encounter: Payer: Self-pay | Admitting: Pulmonary Disease

## 2019-10-15 ENCOUNTER — Ambulatory Visit (INDEPENDENT_AMBULATORY_CARE_PROVIDER_SITE_OTHER): Payer: Medicare HMO | Admitting: Family Medicine

## 2019-10-15 VITALS — BP 118/64 | HR 69 | Temp 98.2°F | Resp 14 | Ht 63.25 in | Wt 154.8 lb

## 2019-10-15 DIAGNOSIS — I10 Essential (primary) hypertension: Secondary | ICD-10-CM

## 2019-10-15 DIAGNOSIS — Z7189 Other specified counseling: Secondary | ICD-10-CM | POA: Diagnosis not present

## 2019-10-15 DIAGNOSIS — R350 Frequency of micturition: Secondary | ICD-10-CM | POA: Diagnosis not present

## 2019-10-15 DIAGNOSIS — R05 Cough: Secondary | ICD-10-CM

## 2019-10-15 DIAGNOSIS — R053 Chronic cough: Secondary | ICD-10-CM | POA: Insufficient documentation

## 2019-10-15 DIAGNOSIS — N3000 Acute cystitis without hematuria: Secondary | ICD-10-CM | POA: Diagnosis not present

## 2019-10-15 DIAGNOSIS — N1831 Chronic kidney disease, stage 3a: Secondary | ICD-10-CM | POA: Insufficient documentation

## 2019-10-15 LAB — POCT URINALYSIS DIPSTICK
Bilirubin, UA: NEGATIVE
Blood, UA: POSITIVE
Glucose, UA: NEGATIVE
Ketones, UA: POSITIVE
Nitrite, UA: POSITIVE
Protein, UA: POSITIVE — AB
Spec Grav, UA: 1.025 (ref 1.010–1.025)
Urobilinogen, UA: 1 E.U./dL
pH, UA: 5.5 (ref 5.0–8.0)

## 2019-10-15 MED ORDER — FLUCONAZOLE 150 MG PO TABS: 150.0000 mg | ORAL_TABLET | Freq: Once | ORAL | 1 refills | Status: AC

## 2019-10-15 MED ORDER — ESTRADIOL 0.025 MG/24HR TD PTWK: 0.0250 mg | MEDICATED_PATCH | TRANSDERMAL | 0 refills | Status: DC

## 2019-10-15 MED ORDER — CEPHALEXIN 500 MG PO CAPS: 500.0000 mg | ORAL_CAPSULE | Freq: Two times a day (BID) | ORAL | 0 refills | Status: AC

## 2019-10-15 NOTE — Assessment & Plan Note (Signed)
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 No dyspnea. Will get CXR today to evaluate. Follow-up final read. If normal, trial of PPI for possible GERD. Referral or further work-up pending final read.

## 2019-10-15 NOTE — Assessment & Plan Note (Signed)
BP at goal, continue current medications

## 2019-10-15 NOTE — Progress Notes (Signed)
Subjective:     Heather Cobb is a 79 y.o. female presenting for Establish Care (Dr Luan Pulling in [employer]) and Urinary Frequency (abnormal urine odor, back pain x 2 weeks)     HPI  #UTI - abnormal urine order - back pain x 2 weeks - no burning with urination - some itching - uses cream - takes cranberry and drinks cranberry juice - increased frequency  #Cough - has been going on for years - use to work in a mill - seems to notice it more when she is eating - eating too fast or when she is talking too much - non-productive - dry hard cough - itchy throat all the day - no heartburn - occasionally worse at night - no sinus drainage - tried OTC cough medication w/o help  #Estrogen replacement - stopped for 6-7 months - Dr. Aris Lot does house visits and restarted this - she was having nightsweats - was initially on the pill and switched to patch - was taking for >20 years  Review of Systems   Social History   Tobacco Use  Smoking Status Never Smoker  Smokeless Tobacco Never Used        Objective:    BP Readings from Last 3 Encounters:  10/15/19 118/64  10/02/19 131/74  03/05/19 132/70   Wt Readings from Last 3 Encounters:  10/15/19 154 lb 12 oz (70.2 kg)  10/02/19 156 lb 1.6 oz (70.8 kg)  03/05/19 159 lb (72.1 kg)    BP 118/64   Pulse 69   Temp 98.2 F (36.8 C)   Resp 14   Ht 5' 3.25" (1.607 m)   Wt 154 lb 12 oz (70.2 kg)   SpO2 97%   BMI 27.20 kg/m    Physical Exam Constitutional:      General: She is not in acute distress.    Appearance: She is well-developed. She is not diaphoretic.  HENT:     Right Ear: External ear normal.     Left Ear: External ear normal.  Eyes:     Conjunctiva/sclera: Conjunctivae normal.  Cardiovascular:     Rate and Rhythm: Normal rate and regular rhythm.     Heart sounds: No murmur.  Pulmonary:     Effort: Pulmonary effort is normal. No respiratory distress.     Breath sounds: Normal breath sounds. No  wheezing.  Musculoskeletal:     Cervical back: Neck supple.  Skin:    General: Skin is warm and dry.     Capillary Refill: Capillary refill takes less than 2 seconds.  Neurological:     Mental Status: She is alert. Mental status is at baseline.  Psychiatric:        Mood and Affect: Mood normal.        Behavior: Behavior normal.           Assessment & Plan:   Problem List Items Addressed This Visit      Cardiovascular and Mediastinum   Essential hypertension, benign    BP at goal, continue current medications        Other   Counseling for estrogen replacement therapy    Discussed that given age and HTN estrogen increases risk for MI/Stroke. Discussed reducing dose of patch with goal of eventually stopping. F/u in 4 weeks.       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 Will get CXR today to evaluate. Follow-up final read. If normal, trial of PPI for possible GERD. Referral or further work-up pending final read.       Relevant Orders   DG Chest 2 View    Other Visit Diagnoses    Acute cystitis without hematuria    -  Primary   Relevant Medications   cephALEXin (KEFLEX) 500 MG capsule   fluconazole (DIFLUCAN) 150 MG tablet   Urinary frequency       Relevant Medications   cephALEXin (KEFLEX) 500 MG capsule   fluconazole (DIFLUCAN) 150 MG tablet   Other Relevant Orders   POCT urinalysis dipstick (Completed)   Urine Culture       Return in about 4 weeks (around 11/12/2019).  Lesleigh Noe, MD

## 2019-10-15 NOTE — Assessment & Plan Note (Signed)
Discussed that given age and HTN estrogen increases risk for MI/Stroke. Discussed reducing dose of patch with goal of eventually stopping. F/u in 4 weeks.

## 2019-10-15 NOTE — Patient Instructions (Signed)
#   Cough - Chest X-ray today - if normal, could try 2 weeks of Omeprazole for possible heartburn - if abnormal, will likely plan referral or additional tests  #Estrogen - lower dose of this medication - return in 4 weeks to check in  #UTI - take the antibiotic - if the vaginal irritation/discharge - can take the fluconazole for yeast infection - call if not improved after 1-2 days

## 2019-10-16 ENCOUNTER — Telehealth: Payer: Self-pay | Admitting: Family Medicine

## 2019-10-16 DIAGNOSIS — R918 Other nonspecific abnormal finding of lung field: Secondary | ICD-10-CM

## 2019-10-16 DIAGNOSIS — R911 Solitary pulmonary nodule: Secondary | ICD-10-CM | POA: Insufficient documentation

## 2019-10-16 NOTE — Telephone Encounter (Signed)
Called to relay abnormal CXR.   Spoke with Daughter  Nodule noted on CXR. Would recommend CT Chest. Will order.   Questions answered.

## 2019-10-17 LAB — URINE CULTURE
MICRO NUMBER:: 10081816
SPECIMEN QUALITY:: ADEQUATE

## 2019-10-23 ENCOUNTER — Ambulatory Visit (INDEPENDENT_AMBULATORY_CARE_PROVIDER_SITE_OTHER)
Admission: RE | Admit: 2019-10-23 | Discharge: 2019-10-23 | Disposition: A | Discharge status: Home / Self Care | Payer: Medicare HMO | Source: Ambulatory Visit | Attending: Family Medicine | Admitting: Family Medicine

## 2019-10-23 ENCOUNTER — Other Ambulatory Visit: Payer: Self-pay

## 2019-10-23 DIAGNOSIS — R918 Other nonspecific abnormal finding of lung field: Secondary | ICD-10-CM | POA: Diagnosis not present

## 2019-10-23 DIAGNOSIS — J984 Other disorders of lung: Secondary | ICD-10-CM | POA: Diagnosis not present

## 2019-10-24 ENCOUNTER — Encounter: Payer: Self-pay | Admitting: Family Medicine

## 2019-10-24 DIAGNOSIS — I2584 Coronary atherosclerosis due to calcified coronary lesion: Secondary | ICD-10-CM | POA: Insufficient documentation

## 2019-10-24 DIAGNOSIS — I2510 Atherosclerotic heart disease of native coronary artery without angina pectoris: Secondary | ICD-10-CM | POA: Insufficient documentation

## 2019-10-24 DIAGNOSIS — R053 Chronic cough: Secondary | ICD-10-CM

## 2019-10-24 DIAGNOSIS — R05 Cough: Secondary | ICD-10-CM

## 2019-10-24 DIAGNOSIS — B379 Candidiasis, unspecified: Secondary | ICD-10-CM

## 2019-10-24 DIAGNOSIS — J984 Other disorders of lung: Secondary | ICD-10-CM

## 2019-10-24 DIAGNOSIS — Q446 Cystic disease of liver: Secondary | ICD-10-CM | POA: Insufficient documentation

## 2019-10-24 MED ORDER — BENZONATATE 100 MG PO CAPS: 100.0000 mg | ORAL_CAPSULE | Freq: Two times a day (BID) | ORAL | 0 refills | Status: DC | PRN

## 2019-10-28 MED ORDER — FLUCONAZOLE 150 MG PO TABS: 150.0000 mg | ORAL_TABLET | Freq: Once | ORAL | 0 refills | Status: AC

## 2019-10-28 NOTE — Addendum Note (Signed)
Addended by: Lesleigh Noe on: 10/28/2019 01:34 PM   Modules accepted: Orders

## 2019-11-19 ENCOUNTER — Telehealth: Payer: Self-pay | Admitting: Family Medicine

## 2019-11-19 MED ORDER — LEVALBUTEROL TARTRATE 45 MCG/ACT IN AERO: 1.0000 | INHALATION_SPRAY | RESPIRATORY_TRACT | 0 refills | Status: DC | PRN

## 2019-11-19 MED ORDER — MOMETASONE FUROATE 50 MCG/ACT NA SUSP: 2.0000 | Freq: Every day | NASAL | 0 refills | Status: DC

## 2019-11-19 NOTE — Telephone Encounter (Signed)
Noted. This has been updated in the chart

## 2019-11-19 NOTE — Telephone Encounter (Signed)
Patient called to give you the 2 medication names that she forgot to give you at her appt She stated she is taking the following    XOPENEX HFA 45mg  / Actuation   MOMETASONFUROAT Nasal Spray 50mg 

## 2019-12-03 ENCOUNTER — Encounter: Payer: Self-pay | Admitting: Family Medicine

## 2019-12-03 DIAGNOSIS — R053 Chronic cough: Secondary | ICD-10-CM

## 2019-12-03 DIAGNOSIS — J984 Other disorders of lung: Secondary | ICD-10-CM

## 2019-12-03 DIAGNOSIS — R05 Cough: Secondary | ICD-10-CM

## 2019-12-03 MED ORDER — BENZONATATE 100 MG PO CAPS: 100.0000 mg | ORAL_CAPSULE | Freq: Two times a day (BID) | ORAL | 0 refills | Status: DC | PRN

## 2019-12-05 ENCOUNTER — Other Ambulatory Visit: Payer: Self-pay

## 2019-12-05 ENCOUNTER — Ambulatory Visit (INDEPENDENT_AMBULATORY_CARE_PROVIDER_SITE_OTHER): Payer: Medicare HMO | Admitting: Family Medicine

## 2019-12-05 ENCOUNTER — Encounter: Payer: Self-pay | Admitting: Family Medicine

## 2019-12-05 VITALS — BP 128/72 | HR 56 | Temp 98.0°F | Resp 12 | Ht 63.25 in | Wt 157.0 lb

## 2019-12-05 DIAGNOSIS — N898 Other specified noninflammatory disorders of vagina: Secondary | ICD-10-CM

## 2019-12-05 DIAGNOSIS — Z23 Encounter for immunization: Secondary | ICD-10-CM

## 2019-12-05 MED ORDER — SHINGRIX 50 MCG/0.5ML IM SUSR: 0.5000 mL | Freq: Once | INTRAMUSCULAR | 1 refills | Status: AC

## 2019-12-05 MED ORDER — TETANUS-DIPHTH-ACELL PERTUSSIS 5-2.5-18.5 LF-MCG/0.5 IM SUSP: 0.5000 mL | Freq: Once | INTRAMUSCULAR | 0 refills | Status: AC

## 2019-12-05 NOTE — Patient Instructions (Signed)
Results should be back by tomorrow (Friday)   If signs of infection will treat  If no infection would recommend trial of vaginal anti-itch cream to see if that persists.   Monitor for skin changes and if present, return to the clinic for re-evaluation

## 2019-12-05 NOTE — Progress Notes (Signed)
   Subjective:     Heather Cobb is a 79 y.o. female presenting for Vaginal Itching (x 2 weeks.)     HPI   #Vaginal itching - mostly vaginal itching - no discharge - no pelvic pain - no longer having any urinary symptoms   Review of Systems   Social History   Tobacco Use  Smoking Status Never Smoker  Smokeless Tobacco Never Used        Objective:    BP Readings from Last 3 Encounters:  12/05/19 128/72  10/15/19 118/64  10/02/19 131/74   Wt Readings from Last 3 Encounters:  12/05/19 157 lb (71.2 kg)  10/15/19 154 lb 12 oz (70.2 kg)  10/02/19 156 lb 1.6 oz (70.8 kg)    BP 128/72   Pulse (!) 56   Temp 98 F (36.7 C)   Resp 12   Ht 5' 3.25" (1.607 m)   Wt 157 lb (71.2 kg)   BMI 27.59 kg/m    Physical Exam Constitutional:      General: She is not in acute distress.    Appearance: She is well-developed. She is not diaphoretic.  HENT:     Right Ear: External ear normal.     Left Ear: External ear normal.     Nose: Nose normal.  Eyes:     Conjunctiva/sclera: Conjunctivae normal.  Cardiovascular:     Rate and Rhythm: Normal rate and regular rhythm.     Heart sounds: No murmur.  Pulmonary:     Effort: Pulmonary effort is normal.  Abdominal:     General: Abdomen is flat. Bowel sounds are normal. There is no distension.     Palpations: Abdomen is soft.     Tenderness: There is no abdominal tenderness.  Musculoskeletal:     Cervical back: Neck supple.  Skin:    General: Skin is warm and dry.     Capillary Refill: Capillary refill takes less than 2 seconds.  Neurological:     Mental Status: She is alert. Mental status is at baseline.  Psychiatric:        Mood and Affect: Mood normal.        Behavior: Behavior normal.           Assessment & Plan:   Problem List Items Addressed This Visit    None    Visit Diagnoses    Vaginal itching    -  Primary   Need for Tdap vaccination       Relevant Medications   Tdap (BOOSTRIX) 5-2.5-18.5  LF-MCG/0.5 injection   Need for shingles vaccine       Relevant Medications   Zoster Vaccine Adjuvanted [facility]) injection     Vaginal pathogen swab sent to pharmacy Advised OTC anti-itch cream prn Follow-up results and treat as appropriate Advised probiotic and yogurt with abx in the future  Return if symptoms worsen or fail to improve.  Lesleigh Noe, MD

## 2019-12-06 ENCOUNTER — Encounter: Payer: Self-pay | Admitting: Family Medicine

## 2019-12-06 ENCOUNTER — Other Ambulatory Visit: Payer: Self-pay | Admitting: Family Medicine

## 2019-12-06 DIAGNOSIS — N760 Acute vaginitis: Secondary | ICD-10-CM

## 2019-12-06 DIAGNOSIS — B9689 Other specified bacterial agents as the cause of diseases classified elsewhere: Secondary | ICD-10-CM

## 2019-12-06 LAB — WET PREP BY MOLECULAR PROBE
Candida species: NOT DETECTED
MICRO NUMBER:: 10266395
SPECIMEN QUALITY:: ADEQUATE
Trichomonas vaginosis: NOT DETECTED

## 2019-12-06 MED ORDER — METRONIDAZOLE 500 MG PO TABS: 500.0000 mg | ORAL_TABLET | Freq: Two times a day (BID) | ORAL | 0 refills | Status: AC

## 2019-12-16 ENCOUNTER — Ambulatory Visit: Payer: Medicare HMO | Admitting: Family Medicine

## 2019-12-17 ENCOUNTER — Other Ambulatory Visit: Payer: Self-pay

## 2019-12-17 ENCOUNTER — Encounter: Payer: Self-pay | Admitting: Internal Medicine

## 2019-12-17 ENCOUNTER — Ambulatory Visit (INDEPENDENT_AMBULATORY_CARE_PROVIDER_SITE_OTHER): Payer: Medicare HMO | Admitting: Internal Medicine

## 2019-12-17 VITALS — BP 142/62 | HR 58 | Temp 98.5°F | Ht 64.5 in | Wt 157.4 lb

## 2019-12-17 DIAGNOSIS — R053 Chronic cough: Secondary | ICD-10-CM

## 2019-12-17 DIAGNOSIS — R0602 Shortness of breath: Secondary | ICD-10-CM | POA: Diagnosis not present

## 2019-12-17 DIAGNOSIS — R05 Cough: Secondary | ICD-10-CM

## 2019-12-17 MED ORDER — PANTOPRAZOLE SODIUM 40 MG PO TBEC: 40.0000 mg | DELAYED_RELEASE_TABLET | Freq: Every day | ORAL | 3 refills | Status: DC

## 2019-12-17 MED ORDER — ALBUTEROL SULFATE HFA 108 (90 BASE) MCG/ACT IN AERS: 2.0000 | INHALATION_SPRAY | Freq: Four times a day (QID) | RESPIRATORY_TRACT | 3 refills | Status: DC | PRN

## 2019-12-17 NOTE — Progress Notes (Signed)
CORINNA MARBRY    [id number]    04-06-1941  Primary Care Physician:Cody, Jobe Marker, MD  Referring Physician: Lesleigh Noe, [place],  [place] [address] Reason for Consultation: "Chronic cough//Scarring of lung" Date of Consultation: 12/17/2019  Chief complaint:   Chief Complaint  Patient presents with  . Consult     HPI: Chronic cough for many years, but getting progressively worse as she has gotten older.  Worse with laying down and with eating and drinking. Cough is also bad at night time. Cough is dry, non-productive.  She has been taking tessalon perles which help.  OTC mucinex helped.   Gets sob with walking up hills.  She also has cough with exertion.  Wheezes when she is laying down but not with exertion.  Sleeps with two pillows.   No childhood asthma or respiratory difficulties, but multiple members of the family have it. No frequent URIs.    She does have frequent throat clearing. No sinus pain/pressure, but gets watery eyes in the spring.   Has been given albuterol inhaler and nasonex in the past but never really used it.Has a history of paraganglioma resection of carotid sinus, no change in cough with that.  Social history: Occupation: worked for 27 years in [employer], trade [employer].  Smoking history: never smoker, husband smoked. Husband died from cancer 6 years ago.   Social History   Occupational History  . Occupation: Engineer, manufacturing systems    Comment: retired  Tobacco Use  . Smoking status: Never Smoker  . Smokeless tobacco: Never Used  Substance and Sexual Activity  . Alcohol use: No    Alcohol/week: 0.0 standard drinks  . Drug use: No  . Sexual activity: Not Currently    Birth control/protection: Surgical    Relevant family history:  Family History  Problem Relation Age of Onset  . Asthma Mother   . Kidney failure Mother   . Hypertension Daughter   . Colon cancer Father        dx in his 21s  . Stomach cancer Sister    stage 4  . Colon cancer Maternal Uncle   . Heart attack Maternal Grandmother   . Asthma Brother   . Cancer Neg Hx     Past Medical History:  Diagnosis Date  . Anemia   . Carotid body tumor ([place])    left  . Family history of colon cancer   . Hypertension   . Hypothyroid   . Pyelonephritis   . Rectal bleeding 11/26/2015  . Vertigo   . Vitamin B 12 deficiency     Past Surgical History:  Procedure Laterality Date  . ABDOMINAL HYSTERECTOMY    . CATARACT EXTRACTION W/ INTRAOCULAR LENS  IMPLANT, BILATERAL    . CHOLECYSTECTOMY N/A 04/15/2015   Procedure: LAPAROSCOPIC CHOLECYSTECTOMY;  Surgeon: Aviva Signs, MD;  Location: [facility];  Service: General;  Laterality: N/A;  . COLONOSCOPY  2012   [place]  . COLONOSCOPY WITH PROPOFOL N/A 02/03/2016   Procedure: COLONOSCOPY WITH PROPOFOL;  Surgeon: Robert Bellow, MD;  Location: [facility] END[facility]COPY;  Service: Endoscopy;  Laterality: N/A;  . EYE SURGERY Bilateral    cataract extractions  . HEMORRHOID SURGERY N/A 08/31/2018   Procedure: HEMORRHOIDECTOMY;  Surgeon: Robert Bellow, MD;  Location: [facility];  Service: General;  Laterality: N/A;  . IR ANGIO EXTERNAL CAROTID SEL EXT CAROTID UNI L MOD SED  10/26/2017  . IR ANGIO INTRA EXTRACRAN SEL  COM CAROTID INNOMINATE BILAT MOD SED  10/26/2017  . IR ANGIO VERTEBRAL SEL VERTEBRAL UNI R MOD SED  10/26/2017  . IR ANGIOGRAM EXTREMITY LEFT  10/26/2017  . IR ANGIOGRAM FOLLOW UP STUDY  10/26/2017  . IR NEURO EACH ADD'L AFTER BASIC UNI LEFT (MS)  10/26/2017  . IR RADIOLOGIST EVAL & MGMT  09/21/2017  . IR TRANSCATH/EMBOLIZ  10/26/2017  . MASS EXCISION Left 10/27/2017   Procedure: EXCISION CAROTID BODY TUMOR;  Surgeon: Melida Quitter, MD;  Location: [facility];  Service: ENT;  Laterality: Left;  . RADIOLOGY WITH ANESTHESIA N/A 10/26/2017   Procedure: EMBOLIZATION;  Surgeon: Luanne Bras, MD;  Location: [facility];  Service: Radiology;  Laterality: N/A;  . SIGMOIDOSCOPY N/A 08/31/2018   Procedure: SIGMOIDOSCOPY-IN  OR;   Surgeon: Robert Bellow, MD;  Location: [facility];  Service: General;  Laterality: N/A;  . UPPER GASTROINTESTINAL ENDOSCOPY  2014  . VESICOVAGINAL FISTULA CLOSURE W/ TAH       Review of systems: Review of Systems  Constitutional: Negative for chills, fever and weight loss.  HENT: Negative for congestion, sinus pain and sore throat.   Eyes: Negative for discharge and redness.  Respiratory: Positive for cough and shortness of breath. Negative for hemoptysis, sputum production and wheezing.   Cardiovascular: Negative for chest pain, palpitations and leg swelling.  Gastrointestinal: Negative for heartburn, nausea and vomiting.  Musculoskeletal: Negative for joint pain and myalgias.  Skin: Negative for rash.  Neurological: Negative for dizziness, tremors, focal weakness and headaches.  Endo/Heme/Allergies: Negative for environmental allergies.  Psychiatric/Behavioral: Negative for depression. The patient is not nervous/anxious.   All other systems reviewed and are negative.   Physical Exam: Blood pressure (!) 142/62, pulse (!) 58, temperature 98.5 F (36.9 C), temperature source Temporal, height 5' 4.5" (1.638 m), weight 157 lb 6.4 oz (71.4 kg), SpO2 98 %. Gen:      No acute distress ENT:  no nasal polyps, mucus membranes moist. mallampati IV. Lungs:    No increased respiratory effort, symmetric chest wall excursion, clear to auscultation bilaterally, no wheezes or crackles CV:         Regular rate and rhythm; no murmurs, rubs, or gallops.  No pedal edema MSK: no acute synovitis of DIP or PIP joints, no mechanics hands.  Neuro: normal speech, no focal facial asymmetry Psych: alert and oriented x3, normal mood and affect   Data Reviewed/Medical Decision Making:  Independent interpretation of tests: Imaging: Chest xray and CT Chest from Jan/Feb 2021. CT chest without any nodules, masses effusions. Mild left lower lobe ground glass which is clinically indeterminate.   PFTs: never  had  Labs:  Lab Results  Component Value Date   WBC 5.4 08/29/2019   HGB 12.2 08/29/2019   HCT 40.6 08/29/2019   MCV 92.9 08/29/2019   PLT 233 08/29/2019   Lab Results  Component Value Date   NA 142 08/29/2019   K 3.5 08/29/2019   CL 101 08/29/2019   CO2 29 08/29/2019     Immunization status:  Immunization History  Administered Date(s) Administered  . Influenza, High Dose Seasonal PF 06/19/2019    . I reviewed prior external note(s) from Dr. Einar Pheasant, Endoscopy procedure notes. . I reviewed the result(s) of the labs and imaging as noted above.  . I have ordered PFTs  Assessment:  Chronic Cough Shortness of breath  Plan/Recommendations: Shortness of breath may be related to possible asthma. Will do a trial of albuterol with exertion and see if this improves  her symptoms. Has rhinitis, seasonal allergies and significant family history. Will also obtain spiro/feno  Her cough may or not be related to dyspnea - she feels it is fairly predictable with laying flat and with eating. Will trial PPI for GERD. Discussed wedge pillow and lifestyle modifications.   We discussed disease management and progression at length today.   Return to Care: Return in about 2 months (around 02/16/2020).  Lenice Llamas, MD Pulmonary and [place]  CC: Lesleigh Noe, MD

## 2019-12-17 NOTE — Patient Instructions (Addendum)
The patient should have follow up scheduled with myself in 2 months.   Prior to next visit patient should have: Spirometry/Feno  Start taking pantoprazole once a day for reflux.  What is GERD? Gastroesophageal reflux disease (GERD) is gastroesophageal reflux diseasewhich occurs when the lower esophageal sphincter (LES) opens spontaneously, for varying periods of time, or does not close properly and stomach contents rise up into the esophagus. GER is also called acid reflux or acid regurgitation, because digestive juices--called acids--rise up with the food. The esophagus is the tube that carries food from the mouth to the stomach. The LES is a ring of muscle at the bottom of the esophagus that acts like a valve between the esophagus and stomach.  When acid reflux occurs, food or fluid can be tasted in the back of the mouth. When refluxed stomach acid touches the lining of the esophagus it may cause a burning sensation in the chest or throat called heartburn or acid indigestion. Occasional reflux is common. Persistent reflux that occurs more than twice a week is considered GERD, and it can eventually lead to more serious health problems. People of all ages can have GERD. Studies have shown that GERD may worsen or contribute to asthma, chronic cough, and pulmonary fibrosis.   What are the symptoms of GERD? The main symptom of GERD in adults is frequent heartburn, also called acid indigestion--burning-type pain in the lower part of the mid-chest, behind the breast bone, and in the mid-abdomen.  Not all reflux is acidic in nature, and many patients don't have heart burn at all. Sometimes it feels like a cough (either dry or with mucus), choking sensation, asthma, shortness of breath, waking up at night, frequent throat clearing, or trouble swallowing.    What causes GERD? The reason some people develop GERD is still unclear. However, research shows that in people with GERD, the LES relaxes while the  rest of the esophagus is working. Anatomical abnormalities such as a hiatal hernia may also contribute to GERD. A hiatal hernia occurs when the upper part of the stomach and the LES move above the diaphragm, the muscle wall that separates the stomach from the chest. Normally, the diaphragm helps the LES keep acid from rising up into the esophagus. When a hiatal hernia is present, acid reflux can occur more easily. A hiatal hernia can occur in people of any age and is most often a normal finding in otherwise healthy people over age 95. Most of the time, a hiatal hernia produces no symptoms.   Other factors that may contribute to GERD include - Obesity or recent weight gain - Pregnancy  - Smoking  - Diet - Certain medications  Common foods that can worsen reflux symptoms include: - carbonated beverages - artificial sweeteners - citrus fruits  - chocolate  - drinks with caffeine or alcohol  - fatty and fried foods  - garlic and onions  - mint flavorings  - spicy foods  - tomato-based foods, like spaghetti sauce, salsa, chili, and pizza   Lifestyle Changes If you smoke, stop.  Avoid foods and beverages that worsen symptoms (see above.) Lose weight if needed.  Eat small, frequent meals.  Wear loose-fitting clothes.  Avoid lying down for 3 hours after a meal.  Raise the head of your bed 6 to 8 inches by securing wood blocks under the bedposts. Just using extra pillows will not help, but using a wedge-shaped pillow may be helpful.  Medications  H2 blockers, such as cimetidine (  Tagamet HB), famotidine (Pepcid AC), nizatidine (Axid AR), and ranitidine (Zantac 75), decrease acid production. They are available in prescription strength and over-the-counter strength. These drugs provide short-term relief and are effective for about half of those who have GERD symptoms.  Proton pump inhibitors include omeprazole (Prilosec, Zegerid), lansoprazole (Prevacid), pantoprazole (Protonix), rabeprazole  (Aciphex), and esomeprazole (Nexium), which are available by prescription. Prilosec is also available in over-the-counter strength. Proton pump inhibitors are more effective than H2 blockers and can relieve symptoms and heal the esophageal lining in almost everyone who has GERD.  Because drugs work in different ways, combinations of medications may help control symptoms. People who get heartburn after eating may take both antacids and H2 blockers. The antacids work first to neutralize the acid in the stomach, and then the H2 blockers act on acid production. By the time the antacid stops working, the H2 blocker will have stopped acid production. Your health care provider is the best source of information about how to use medications for GERD.   Points to Remember 1. You can have GERD without having heartburn. Your symptoms could include a dry cough, asthma symptoms, or trouble swallowing.  2. Taking medications daily as prescribed is important in controlling you symptoms.  Sometimes it can take up to 8 weeks to fully achieve the effects of the medications prescribed.  3. Coughing related to GERD can be difficult to treat and is very frustrating!  However, it is important to stick with these medications and lifestyle modifications before pursuing more aggressive or invasive test and treatments.  -------------------------------------------------------------------------------------------------------------------------------  Take the albuterol rescue inhaler every 4 to 6 hours as needed for wheezing or shortness of breath. You can also take it 15 minutes before exercise or exertional activity. Side effects include heart racing or pounding, jitters or anxiety. If you have a history of an irregular heart rhythm, it can make this worse. Can also give some patients a hard time sleeping.  To inhale the aerosol using an inhaler, follow these steps:  1. Remove the protective dust cap from the end of the  mouthpiece. If the dust cap was not placed on the mouthpiece, check the mouthpiece for dirt or other objects. Be sure that the canister is fully and firmly inserted in the mouthpiece. 2. If you are using the inhaler for the first time or if you have not used the inhaler in more than 14 days, you will need to prime it. You may also need to prime the inhaler if it has been dropped. Ask your pharmacist or check the manufacturer's information if this happens. To prime the inhaler, shake it well and then press down on the canister 4 times to release 4 sprays into the air, away from your face. Be careful not to get albuterol in your eyes. 3. Shake the inhaler well. 4. Breathe out as completely as possible through your mouth. 4. Hold the canister with the mouthpiece on the bottom, facing you and the canister pointing upward. Place the open end of the mouthpiece into your mouth. Close your lips tightly around the mouthpiece. 6. Breathe in slowly and deeply through the mouthpiece.At the same time, press down once on the container to spray the medication into your mouth. 7. Try to hold your breath for 10 seconds. remove the inhaler, and breathe out slowly. 8. If you were told to use 2 puffs, wait 1 minute and then repeat steps 3-7. 9. Replace the protective cap on the inhaler. 10. Clean your inhaler regularly.  Follow the manufacturer's directions carefully and ask your doctor or pharmacist if you have any questions about cleaning your inhaler.  Check the back of the inhaler to keep track of the total number of doses left on the inhaler.

## 2019-12-23 ENCOUNTER — Other Ambulatory Visit (HOSPITAL_COMMUNITY)
Admission: RE | Admit: 2019-12-23 | Discharge: 2019-12-23 | Disposition: A | Discharge status: Home / Self Care | Payer: Medicare HMO | Source: Ambulatory Visit | Attending: Internal Medicine | Admitting: Internal Medicine

## 2019-12-23 DIAGNOSIS — Z20822 Contact with and (suspected) exposure to covid-19: Secondary | ICD-10-CM | POA: Insufficient documentation

## 2019-12-23 DIAGNOSIS — Z01812 Encounter for preprocedural laboratory examination: Secondary | ICD-10-CM | POA: Insufficient documentation

## 2019-12-23 LAB — SARS CORONAVIRUS 2 (TAT 6-24 HRS): SARS Coronavirus 2: NEGATIVE

## 2019-12-26 ENCOUNTER — Ambulatory Visit (INDEPENDENT_AMBULATORY_CARE_PROVIDER_SITE_OTHER): Payer: Medicare HMO

## 2019-12-26 ENCOUNTER — Other Ambulatory Visit: Payer: Self-pay

## 2019-12-26 DIAGNOSIS — R0602 Shortness of breath: Secondary | ICD-10-CM | POA: Diagnosis not present

## 2019-12-26 LAB — NITRIC OXIDE: Nitric Oxide: 8

## 2020-02-10 ENCOUNTER — Encounter: Payer: Self-pay | Admitting: Internal Medicine

## 2020-02-10 ENCOUNTER — Ambulatory Visit (INDEPENDENT_AMBULATORY_CARE_PROVIDER_SITE_OTHER): Payer: Medicare HMO | Admitting: Internal Medicine

## 2020-02-10 ENCOUNTER — Other Ambulatory Visit: Payer: Self-pay

## 2020-02-10 VITALS — BP 100/60 | HR 68 | Temp 97.8°F | Ht 64.5 in | Wt 152.8 lb

## 2020-02-10 DIAGNOSIS — R0602 Shortness of breath: Secondary | ICD-10-CM | POA: Diagnosis not present

## 2020-02-10 DIAGNOSIS — R05 Cough: Secondary | ICD-10-CM | POA: Diagnosis not present

## 2020-02-10 DIAGNOSIS — R053 Chronic cough: Secondary | ICD-10-CM

## 2020-02-10 DIAGNOSIS — R059 Cough, unspecified: Secondary | ICD-10-CM

## 2020-02-10 MED ORDER — PANTOPRAZOLE SODIUM 40 MG PO TBEC: 40.0000 mg | DELAYED_RELEASE_TABLET | Freq: Every day | ORAL | 11 refills | Status: DC

## 2020-02-10 MED ORDER — ALBUTEROL SULFATE HFA 108 (90 BASE) MCG/ACT IN AERS: 2.0000 | INHALATION_SPRAY | Freq: Four times a day (QID) | RESPIRATORY_TRACT | 11 refills | Status: DC | PRN

## 2020-02-10 NOTE — Progress Notes (Signed)
Heather Cobb    QX:[id number]    01/23/1941  Primary Care Physician:Cody, Jobe Marker, MD Date of Appointment: 02/10/2020 Established Patient Visit  Chief complaint:   Chief Complaint  Patient presents with  . Follow-up    denies sob.  coughs with eating, trying to cut back on spicy/greasy foods.    HPI: Heather Cobb is a 79 y.o. woman who presents for follow up of cough and shortness of breath.  Interval Updates: Had spirometry this morning which was normal. No airflow limitation. Using albuterol inhaler after she comes back from a walk. This does seem to help. No ED visits or hospitalizations.   Still having cough with eating, trying to cut back on spicy foods. Not taking omeprazole daily.  Here with her daughter today.   I have reviewed the patient's family social and past medical history and updated as appropriate.   Past Medical History:  Diagnosis Date  . Anemia   . Carotid body tumor ([place])    left  . Family history of colon cancer   . Hypertension   . Hypothyroid   . Pyelonephritis   . Rectal bleeding 11/26/2015  . Vertigo   . Vitamin B 12 deficiency     Past Surgical History:  Procedure Laterality Date  . ABDOMINAL HYSTERECTOMY    . CATARACT EXTRACTION W/ INTRAOCULAR LENS  IMPLANT, BILATERAL    . CHOLECYSTECTOMY N/A 04/15/2015   Procedure: LAPAROSCOPIC CHOLECYSTECTOMY;  Surgeon: Aviva Signs, MD;  Location: [facility];  Service: General;  Laterality: N/A;  . COLONOSCOPY  2012   [place]  . COLONOSCOPY WITH PROPOFOL N/A 02/03/2016   Procedure: COLONOSCOPY WITH PROPOFOL;  Surgeon: Robert Bellow, MD;  Location[facility];  Service: Endoscopy;  Laterality: N/A;  . EYE SURGERY Bilateral    cataract extractions  . HEMORRHOID SURGERY N/A 08/31/2018   Procedure: HEMORRHOIDECTOMY;  Surgeon: Robert Bellow, MD;  Location: [facility];  Service: General;  Laterality: N/A;  . IR ANGIO EXTERNAL CAROTID SEL EXT CAROTID UNI L MOD SED  10/26/2017  . IR  ANGIO INTRA EXTRACRAN SEL COM CAROTID INNOMINATE BILAT MOD SED  10/26/2017  . IR ANGIO VERTEBRAL SEL VERTEBRAL UNI R MOD SED  10/26/2017  . IR ANGIOGRAM EXTREMITY LEFT  10/26/2017  . IR ANGIOGRAM FOLLOW UP STUDY  10/26/2017  . IR NEURO EACH ADD'L AFTER BASIC UNI LEFT (MS)  10/26/2017  . IR RADIOLOGIST EVAL & MGMT  09/21/2017  . IR TRANSCATH/EMBOLIZ  10/26/2017  . MASS EXCISION Left 10/27/2017   Procedure: EXCISION CAROTID BODY TUMOR;  Surgeon: Melida Quitter, MD;  Location: [facility];  Service: ENT;  Laterality: Left;  . RADIOLOGY WITH ANESTHESIA N/A 10/26/2017   Procedure: EMBOLIZATION;  Surgeon: Luanne Bras, MD;  Location: [facility];  Service: Radiology;  Laterality: N/A;  . SIGMOIDOSCOPY N/A 08/31/2018   Procedure: SIGMOIDOSCOPY-IN  OR;  Surgeon: Robert Bellow, MD;  Location: [facility];  Service: General;  Laterality: N/A;  . UPPER GASTROINTESTINAL ENDOSCOPY  2014  . VESICOVAGINAL FISTULA CLOSURE W/ TAH      Family History  Problem Relation Age of Onset  . Asthma Mother   . Kidney failure Mother   . Hypertension Daughter   . Colon cancer Father        dx in his 10s  . Stomach cancer Sister        stage 4  . Colon cancer Maternal Uncle   . Heart attack Maternal Grandmother   .  Asthma Brother   . Cancer Neg Hx     Social History   Occupational History  . Occupation: Engineer, manufacturing systems    Comment: retired  Tobacco Use  . Smoking status: Never Smoker  . Smokeless tobacco: Never Used  Substance and Sexual Activity  . Alcohol use: No    Alcohol/week: 0.0 standard drinks  . Drug use: No  . Sexual activity: Not Currently    Birth control/protection: Surgical     Physical Exam: Blood pressure 100/60, pulse 68, temperature 97.8 F (36.6 C), temperature source Temporal, height 5' 4.5" (1.638 m), weight 152 lb 12.8 oz (69.3 kg), SpO2 96 %.  Gen:      No acute distress Lungs:    No increased respiratory effort, symmetric chest wall excursion, clear to auscultation bilaterally, no wheezes or  crackles CV:         Regular rate and rhythm; no murmurs, rubs, or gallops.  No pedal edema   Data Reviewed: Imaging: I have personally reviewed the CT Chest from Feb 2021 which shows no acute cardiopulmonary process - no nodules. No emphysematous changes.  PFTs: Spirometry 5/24 no airflow limitation.   Labs: Lab Results  Component Value Date   WBC 5.4 08/29/2019   HGB 12.2 08/29/2019   HCT 40.6 08/29/2019   MCV 92.9 08/29/2019   PLT 233 08/29/2019   Lab Results  Component Value Date   NA 142 08/29/2019   K 3.5 08/29/2019   CL 101 08/29/2019   CO2 29 08/29/2019    Immunization status: Immunization History  Administered Date(s) Administered  . Influenza, High Dose Seasonal PF 06/19/2019  . PFIZER SARS-COV-2 Vaccination 10/29/2019, 11/19/2019    Assessment:  Chronic Cough - GERD Shortness of breath - likely mild intermittent asthma.  Plan/Recommendations: Continue albuterol PRN for mild asthma symptoms. Can take 15 minutes prior to activity  Cough is related to GERD. Discussed wedge pillow and lifestyle modifications. She will adhere to daily PPI for improvement in her symptoms. represcribed pantoprazole.    Return to Care: Return in about 6 months (around 08/12/2020).   Lenice Llamas, MD Pulmonary and [place]

## 2020-02-10 NOTE — Progress Notes (Signed)
foll

## 2020-02-10 NOTE — Patient Instructions (Addendum)
The patient should have follow up scheduled with myself in 6 months.   Take the albuterol rescue inhaler every 4 to 6 hours as needed for wheezing or shortness of breath. You can also take it 15 minutes before exercise or exertional activity. Side effects include heart racing or pounding, jitters or anxiety. If you have a history of an irregular heart rhythm, it can make this worse. Can also give some patients a hard time sleeping.  To inhale the aerosol using an inhaler, follow these steps:  1. Remove the protective dust cap from the end of the mouthpiece. If the dust cap was not placed on the mouthpiece, check the mouthpiece for dirt or other objects. Be sure that the canister is fully and firmly inserted in the mouthpiece. 2. If you are using the inhaler for the first time or if you have not used the inhaler in more than 14 days, you will need to prime it. You may also need to prime the inhaler if it has been dropped. Ask your pharmacist or check the manufacturer's information if this happens. To prime the inhaler, shake it well and then press down on the canister 4 times to release 4 sprays into the air, away from your face. Be careful not to get albuterol in your eyes. 3. Shake the inhaler well. 4. Breathe out as completely as possible through your mouth. 4. Hold the canister with the mouthpiece on the bottom, facing you and the canister pointing upward. Place the open end of the mouthpiece into your mouth. Close your lips tightly around the mouthpiece. 6. Breathe in slowly and deeply through the mouthpiece.At the same time, press down once on the container to spray the medication into your mouth. 7. Try to hold your breath for 10 seconds. remove the inhaler, and breathe out slowly. 8. If you were told to use 2 puffs, wait 1 minute and then repeat steps 3-7. 9. Replace the protective cap on the inhaler. 10. Clean your inhaler regularly. Follow the manufacturer's directions carefully and ask  your doctor or pharmacist if you have any questions about cleaning your inhaler.  Check the back of the inhaler to keep track of the total number of doses left on the inhaler.       What is GERD? Gastroesophageal reflux disease (GERD) is gastroesophageal reflux diseasewhich occurs when the lower esophageal sphincter (LES) opens spontaneously, for varying periods of time, or does not close properly and stomach contents rise up into the esophagus. GER is also called acid reflux or acid regurgitation, because digestive juices--called acids--rise up with the food. The esophagus is the tube that carries food from the mouth to the stomach. The LES is a ring of muscle at the bottom of the esophagus that acts like a valve between the esophagus and stomach.  When acid reflux occurs, food or fluid can be tasted in the back of the mouth. When refluxed stomach acid touches the lining of the esophagus it may cause a burning sensation in the chest or throat called heartburn or acid indigestion. Occasional reflux is common. Persistent reflux that occurs more than twice a week is considered GERD, and it can eventually lead to more serious health problems. People of all ages can have GERD. Studies have shown that GERD may worsen or contribute to asthma, chronic cough, and pulmonary fibrosis.   What are the symptoms of GERD? The main symptom of GERD in adults is frequent heartburn, also called acid indigestion--burning-type pain in the lower  part of the mid-chest, behind the breast bone, and in the mid-abdomen.  Not all reflux is acidic in nature, and many patients don't have heart burn at all. Sometimes it feels like a cough (either dry or with mucus), choking sensation, asthma, shortness of breath, waking up at night, frequent throat clearing, or trouble swallowing.    What causes GERD? The reason some people develop GERD is still unclear. However, research shows that in people with GERD, the LES relaxes while the  rest of the esophagus is working. Anatomical abnormalities such as a hiatal hernia may also contribute to GERD. A hiatal hernia occurs when the upper part of the stomach and the LES move above the diaphragm, the muscle wall that separates the stomach from the chest. Normally, the diaphragm helps the LES keep acid from rising up into the esophagus. When a hiatal hernia is present, acid reflux can occur more easily. A hiatal hernia can occur in people of any age and is most often a normal finding in otherwise healthy people over age 37. Most of the time, a hiatal hernia produces no symptoms.   Other factors that may contribute to GERD include - Obesity or recent weight gain - Pregnancy  - Smoking  - Diet - Certain medications  Common foods that can worsen reflux symptoms include: - carbonated beverages - artificial sweeteners - citrus fruits  - chocolate  - drinks with caffeine or alcohol  - fatty and fried foods  - garlic and onions  - mint flavorings  - spicy foods  - tomato-based foods, like spaghetti sauce, salsa, chili, and pizza   Lifestyle Changes If you smoke, stop.  Avoid foods and beverages that worsen symptoms (see above.) Lose weight if needed.  Eat small, frequent meals.  Wear loose-fitting clothes.  Avoid lying down for 3 hours after a meal.  Raise the head of your bed 6 to 8 inches by securing wood blocks under the bedposts. Just using extra pillows will not help, but using a wedge-shaped pillow may be helpful.  Medications  H2 blockers, such as cimetidine (Tagamet HB), famotidine (Pepcid AC), nizatidine (Axid AR), and ranitidine (Zantac 75), decrease acid production. They are available in prescription strength and over-the-counter strength. These drugs provide short-term relief and are effective for about half of those who have GERD symptoms.  Proton pump inhibitors include omeprazole (Prilosec, Zegerid), lansoprazole (Prevacid), pantoprazole (Protonix), rabeprazole  (Aciphex), and esomeprazole (Nexium), which are available by prescription. Prilosec is also available in over-the-counter strength. Proton pump inhibitors are more effective than H2 blockers and can relieve symptoms and heal the esophageal lining in almost everyone who has GERD.  Because drugs work in different ways, combinations of medications may help control symptoms. People who get heartburn after eating may take both antacids and H2 blockers. The antacids work first to neutralize the acid in the stomach, and then the H2 blockers act on acid production. By the time the antacid stops working, the H2 blocker will have stopped acid production. Your health care provider is the best source of information about how to use medications for GERD.   Points to Remember 1. You can have GERD without having heartburn. Your symptoms could include a dry cough, asthma symptoms, or trouble swallowing.  2. Taking medications daily as prescribed is important in controlling you symptoms.  Sometimes it can take up to 8 weeks to fully achieve the effects of the medications prescribed.  3. Coughing related to GERD can be difficult to treat and  is very frustrating!  However, it is important to stick with these medications and lifestyle modifications before pursuing more aggressive or invasive test and treatments.

## 2020-02-13 ENCOUNTER — Telehealth: Payer: Self-pay | Admitting: Internal Medicine

## 2020-02-13 MED ORDER — ALBUTEROL SULFATE HFA 108 (90 BASE) MCG/ACT IN AERS: 2.0000 | INHALATION_SPRAY | Freq: Four times a day (QID) | RESPIRATORY_TRACT | 5 refills | Status: DC | PRN

## 2020-02-13 MED ORDER — PANTOPRAZOLE SODIUM 40 MG PO TBEC: 40.0000 mg | DELAYED_RELEASE_TABLET | Freq: Every day | ORAL | 5 refills | Status: DC

## 2020-02-13 NOTE — Telephone Encounter (Signed)
Printed albuterol and pantopraozle and signed. She can come pick up written copies.

## 2020-02-13 NOTE — Telephone Encounter (Signed)
Dr. Shearon Stalls, patient wants you to provide her with a written script for all her medications you manage so she can take it to the [facility] in [place]. Please advise.

## 2020-02-13 NOTE — Telephone Encounter (Signed)
Contacted patient, script x 2 mailed to patient as requested, address verified.

## 2020-03-04 ENCOUNTER — Encounter: Payer: Self-pay | Admitting: Family Medicine

## 2020-03-05 MED ORDER — HYDROCHLOROTHIAZIDE 12.5 MG PO TABS: 12.5000 mg | ORAL_TABLET | Freq: Every day | ORAL | 3 refills | Status: DC

## 2020-03-05 MED ORDER — LEVOTHYROXINE SODIUM 88 MCG PO TABS: 88.0000 ug | ORAL_TABLET | Freq: Every day | ORAL | 3 refills | Status: DC

## 2020-03-05 MED ORDER — OLMESARTAN MEDOXOMIL 40 MG PO TABS: 40.0000 mg | ORAL_TABLET | Freq: Every day | ORAL | 3 refills | Status: DC

## 2020-03-09 ENCOUNTER — Other Ambulatory Visit: Payer: Self-pay

## 2020-03-09 MED ORDER — OLMESARTAN MEDOXOMIL 40 MG PO TABS: 40.0000 mg | ORAL_TABLET | Freq: Every day | ORAL | 3 refills | Status: DC

## 2020-03-26 ENCOUNTER — Other Ambulatory Visit (HOSPITAL_COMMUNITY): Payer: Self-pay | Admitting: *Deleted

## 2020-03-26 DIAGNOSIS — D446 Neoplasm of uncertain behavior of carotid body: Secondary | ICD-10-CM

## 2020-03-26 DIAGNOSIS — D447 Neoplasm of uncertain behavior of aortic body and other paraganglia: Secondary | ICD-10-CM

## 2020-03-27 ENCOUNTER — Other Ambulatory Visit: Payer: Self-pay

## 2020-03-27 ENCOUNTER — Ambulatory Visit (HOSPITAL_COMMUNITY)
Admission: RE | Admit: 2020-03-27 | Discharge: 2020-03-27 | Disposition: A | Discharge status: Home / Self Care | Payer: Medicare HMO | Source: Ambulatory Visit | Attending: Hematology | Admitting: Hematology

## 2020-03-27 ENCOUNTER — Inpatient Hospital Stay (HOSPITAL_COMMUNITY): Payer: Medicare HMO | Attending: Hematology

## 2020-03-27 DIAGNOSIS — D447 Neoplasm of uncertain behavior of aortic body and other paraganglia: Secondary | ICD-10-CM | POA: Insufficient documentation

## 2020-03-27 DIAGNOSIS — Z800 Family history of malignant neoplasm of digestive organs: Secondary | ICD-10-CM | POA: Insufficient documentation

## 2020-03-27 DIAGNOSIS — E039 Hypothyroidism, unspecified: Secondary | ICD-10-CM | POA: Diagnosis not present

## 2020-03-27 DIAGNOSIS — Z7951 Long term (current) use of inhaled steroids: Secondary | ICD-10-CM | POA: Diagnosis not present

## 2020-03-27 DIAGNOSIS — Z8249 Family history of ischemic heart disease and other diseases of the circulatory system: Secondary | ICD-10-CM | POA: Diagnosis not present

## 2020-03-27 DIAGNOSIS — Z79899 Other long term (current) drug therapy: Secondary | ICD-10-CM | POA: Insufficient documentation

## 2020-03-27 DIAGNOSIS — D446 Neoplasm of uncertain behavior of carotid body: Secondary | ICD-10-CM | POA: Diagnosis not present

## 2020-03-27 DIAGNOSIS — Z7982 Long term (current) use of aspirin: Secondary | ICD-10-CM | POA: Insufficient documentation

## 2020-03-27 DIAGNOSIS — E079 Disorder of thyroid, unspecified: Secondary | ICD-10-CM | POA: Diagnosis not present

## 2020-03-27 DIAGNOSIS — I10 Essential (primary) hypertension: Secondary | ICD-10-CM | POA: Diagnosis not present

## 2020-03-27 DIAGNOSIS — E538 Deficiency of other specified B group vitamins: Secondary | ICD-10-CM | POA: Insufficient documentation

## 2020-03-27 DIAGNOSIS — N189 Chronic kidney disease, unspecified: Secondary | ICD-10-CM | POA: Insufficient documentation

## 2020-03-27 LAB — COMPREHENSIVE METABOLIC PANEL
ALT: 21 U/L (ref 0–44)
AST: 21 U/L (ref 15–41)
Albumin: 4 g/dL (ref 3.5–5.0)
Alkaline Phosphatase: 59 U/L (ref 38–126)
Anion gap: 8 (ref 5–15)
BUN: 20 mg/dL (ref 8–23)
CO2: 33 mmol/L — ABNORMAL HIGH (ref 22–32)
Calcium: 10.2 mg/dL (ref 8.9–10.3)
Chloride: 101 mmol/L (ref 98–111)
Creatinine, Ser: 1.22 mg/dL — ABNORMAL HIGH (ref 0.44–1.00)
GFR calc Af Amer: 49 mL/min — ABNORMAL LOW (ref >60)
GFR calc non Af Amer: 42 mL/min — ABNORMAL LOW (ref >60)
Glucose, Bld: 114 mg/dL — ABNORMAL HIGH (ref 70–99)
Potassium: 3.3 mmol/L — ABNORMAL LOW (ref 3.5–5.1)
Sodium: 142 mmol/L (ref 135–145)
Total Bilirubin: 0.9 mg/dL (ref 0.3–1.2)
Total Protein: 7.8 g/dL (ref 6.5–8.1)

## 2020-03-27 LAB — CBC WITH DIFFERENTIAL/PLATELET
Abs Immature Granulocytes: 0.01 10*3/uL (ref 0.00–0.07)
Basophils Absolute: 0 10*3/uL (ref 0.0–0.1)
Basophils Relative: 0 %
Eosinophils Absolute: 0.2 10*3/uL (ref 0.0–0.5)
Eosinophils Relative: 4 %
HCT: 38.6 % (ref 36.0–46.0)
Hemoglobin: 12.3 g/dL (ref 12.0–15.0)
Immature Granulocytes: 0 %
Lymphocytes Relative: 31 %
Lymphs Abs: 1.6 10*3/uL (ref 0.7–4.0)
MCH: 28.9 pg (ref 26.0–34.0)
MCHC: 31.9 g/dL (ref 30.0–36.0)
MCV: 90.6 fL (ref 80.0–100.0)
Monocytes Absolute: 0.5 10*3/uL (ref 0.1–1.0)
Monocytes Relative: 10 %
Neutro Abs: 2.8 10*3/uL (ref 1.7–7.7)
Neutrophils Relative %: 55 %
Platelets: 218 10*3/uL (ref 150–400)
RBC: 4.26 MIL/uL (ref 3.87–5.11)
RDW: 14.4 % (ref 11.5–15.5)
WBC: 5.1 10*3/uL (ref 4.0–10.5)
nRBC: 0 % (ref 0.0–0.2)

## 2020-03-27 MED ORDER — IOHEXOL 300 MG/ML  SOLN
75.0000 mL | Freq: Once | INTRAMUSCULAR | Status: AC | PRN
  Administered 2020-03-27: 75 mL via INTRAVENOUS

## 2020-03-31 ENCOUNTER — Inpatient Hospital Stay (HOSPITAL_BASED_OUTPATIENT_CLINIC_OR_DEPARTMENT_OTHER): Payer: Medicare HMO | Admitting: Hematology

## 2020-03-31 ENCOUNTER — Other Ambulatory Visit: Payer: Self-pay

## 2020-03-31 VITALS — BP 101/64 | HR 56 | Temp 97.5°F | Resp 18 | Wt 152.1 lb

## 2020-03-31 DIAGNOSIS — Z7982 Long term (current) use of aspirin: Secondary | ICD-10-CM | POA: Diagnosis not present

## 2020-03-31 DIAGNOSIS — Z79899 Other long term (current) drug therapy: Secondary | ICD-10-CM | POA: Diagnosis not present

## 2020-03-31 DIAGNOSIS — D446 Neoplasm of uncertain behavior of carotid body: Secondary | ICD-10-CM

## 2020-03-31 DIAGNOSIS — I10 Essential (primary) hypertension: Secondary | ICD-10-CM | POA: Diagnosis not present

## 2020-03-31 DIAGNOSIS — E039 Hypothyroidism, unspecified: Secondary | ICD-10-CM | POA: Diagnosis not present

## 2020-03-31 DIAGNOSIS — N189 Chronic kidney disease, unspecified: Secondary | ICD-10-CM | POA: Diagnosis not present

## 2020-03-31 DIAGNOSIS — D447 Neoplasm of uncertain behavior of aortic body and other paraganglia: Secondary | ICD-10-CM

## 2020-03-31 DIAGNOSIS — E538 Deficiency of other specified B group vitamins: Secondary | ICD-10-CM | POA: Diagnosis not present

## 2020-03-31 DIAGNOSIS — Z800 Family history of malignant neoplasm of digestive organs: Secondary | ICD-10-CM | POA: Diagnosis not present

## 2020-03-31 DIAGNOSIS — Z7951 Long term (current) use of inhaled steroids: Secondary | ICD-10-CM | POA: Diagnosis not present

## 2020-03-31 NOTE — Progress Notes (Signed)
[facility], [place] [address]   CLINIC:  Medical Oncology/Hematology  PCP:  Lesleigh Noe, [place] / [place] [address] [phone]   REASON FOR VISIT:  Follow-up for paraganglioma  PRIOR THERAPY: Excision of left carotid body tumor on 10/27/2017  CURRENT THERAPY: Surveillance  BRIEF ONCOLOGIC HISTORY:  Oncology History   No history exists.    CANCER STAGING: Cancer Staging No matching staging information was found for the patient.  INTERVAL HISTORY:  [name], a 79 y.o. 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 Maniah was last seen on 03/05/2019.  Today she is accompanied by her daughter. She reports having no issues, except for occasional left neck pain when turning to the right and associated with sharp pain in her forehead.  She denies having any history of diabetes.   REVIEW OF SYSTEMS:  Review of Systems  Constitutional: Negative for appetite change and fatigue.  Cardiovascular: Positive for leg swelling (R ankle swelling occasionally). Negative for palpitations.  Gastrointestinal: Positive for constipation. Negative for diarrhea.  Neurological: Positive for headaches (when turning her head to the right).  All other systems reviewed and are negative.   PAST MEDICAL/SURGICAL HISTORY:  Past Medical History:  Diagnosis Date  . Anemia   . Carotid body tumor ([facility])    left  . Family history of colon cancer   . Hypertension   . Hypothyroid   . Pyelonephritis   . Rectal bleeding 11/26/2015  . Vertigo   . Vitamin B 12 deficiency    Past Surgical History:  Procedure Laterality Date  . ABDOMINAL HYSTERECTOMY    . CATARACT EXTRACTION W/ INTRAOCULAR LENS  IMPLANT, BILATERAL    . CHOLECYSTECTOMY N/A 04/15/2015   Procedure: LAPAROSCOPIC CHOLECYSTECTOMY;  Surgeon: Aviva Signs, MD;  Location: [facility];  Service: General;  Laterality: N/A;  . COLONOSCOPY  2012   [place]  .  COLONOSCOPY WITH PROPOFOL N/A 02/03/2016   Procedure: COLONOSCOPY WITH PROPOFOL;  Surgeon: Robert Bellow, MD;  [facility] END[facility];  Service: Endoscopy;  Laterality: N/A;  . EYE SURGERY Bilateral    cataract extractions  . HEMORRHOID SURGERY N/A 08/31/2018   Procedure: HEMORRHOIDECTOMY;  Surgeon: Robert Bellow, MD;  Location: [facility] ORS;  Service: General;  Laterality: N/A;  . IR ANGIO EXTERNAL CAROTID SEL EXT CAROTID UNI L MOD SED  10/26/2017  . IR ANGIO INTRA EXTRACRAN SEL COM CAROTID INNOMINATE BILAT MOD SED  10/26/2017  . IR ANGIO VERTEBRAL SEL VERTEBRAL UNI R MOD SED  10/26/2017  . IR ANGIOGRAM EXTREMITY LEFT  10/26/2017  . IR ANGIOGRAM FOLLOW UP STUDY  10/26/2017  . IR NEURO EACH ADD'L AFTER BASIC UNI LEFT (MS)  10/26/2017  . IR RADIOLOGIST EVAL & MGMT  09/21/2017  . IR TRANSCATH/EMBOLIZ  10/26/2017  . MASS EXCISION Left 10/27/2017   Procedure: EXCISION CAROTID BODY TUMOR;  Surgeon: Melida Quitter, MD;  Location: [facility];  Service: ENT;  Laterality: Left;  . RADIOLOGY WITH ANESTHESIA N/A 10/26/2017   Procedure: EMBOLIZATION;  Surgeon: Luanne Bras, MD;  Location: [facility];  Service: Radiology;  Laterality: N/A;  . SIGMOIDOSCOPY N/A 08/31/2018   Procedure: SIGMOIDOSCOPY-IN  OR;  Surgeon: Robert Bellow, MD;  Location: [facility] ORS;  Service: General;  Laterality: N/A;  . UPPER GASTROINTESTINAL ENDOSCOPY  2014  . VESICOVAGINAL FISTULA CLOSURE W/ TAH      SOCIAL HISTORY:  Social History   Socioeconomic History  . Marital status: Widowed  Spouse name: Not on file  . Number of children: 1  . Years of education: high school  . Highest education level: Not on file  Occupational History  . Occupation: Engineer, manufacturing systems    Comment: retired  Tobacco Use  . Smoking status: Never Smoker  . Smokeless tobacco: Never Used  Vaping Use  . Vaping Use: Never used  Substance and Sexual Activity  . Alcohol use: No    Alcohol/week: 0.0 standard drinks  . Drug use: No  . Sexual activity: Not  Currently    Birth control/protection: Surgical  Other Topics Concern  . 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 Difficulty of Paying Living Expenses:   Food Insecurity:   . Worried About Charity fundraiser in the Last Year:   . Arboriculturist in the Last Year:   Transportation Needs:   . Film/video editor (Medical):   Marland Kitchen Lack of Transportation (Non-Medical):   Physical Activity:   . Days of Exercise per Week:   . Minutes of Exercise per Session:   Stress:   . Feeling of Stress :   Social Connections:   . Frequency of Communication with Friends and Family:   . Frequency of Social Gatherings with Friends and Family:   . Attends Religious Services:   . Active Member of Clubs or Organizations:   . Attends Archivist Meetings:   Marland Kitchen Marital Status:   Intimate Partner Violence:   . Fear of Current or Ex-Partner:   . Emotionally Abused:   Marland Kitchen Physically Abused:   . Sexually Abused:     FAMILY HISTORY:  Family History  Problem Relation Age of Onset  . Asthma Mother   . Kidney failure Mother   . Hypertension Daughter   . Colon cancer Father        dx in his 4s  . Stomach cancer Sister        stage 4  . Colon cancer Maternal Uncle   . Heart attack Maternal Grandmother   . Asthma Brother   . Cancer Neg Hx     CURRENT MEDICATIONS:  Current Outpatient Medications  Medication Sig Dispense Refill  . albuterol (VENTOLIN HFA) 108 (90  Base) MCG/ACT inhaler Inhale 2 puffs into the lungs every 6 (six) hours as needed for wheezing or shortness of breath. 18 g 11  . albuterol (VENTOLIN HFA) 108 (90 Base) MCG/ACT inhaler Inhale 2 puffs into the lungs every 6 (six) hours as needed. 18 g 5  . aspirin EC 81 MG tablet Take 81 mg by mouth daily.    . benzonatate (TESSALON) 100 MG capsule Take 1 capsule (100 mg total) by mouth 2 (two) times daily as needed for cough. 20 capsule 0  . cholecalciferol (VITAMIN D) 1000  UNITS tablet Take 1,000 Units by mouth daily.    Marland Kitchen estradiol (CLIMARA - DOSED IN MG/24 HR) 0.025 mg/24hr patch Place 1 patch (0.025 mg total) onto the skin every Friday. 12 patch 0  . ferrous sulfate 325 (65 FE) MG tablet Take 325 mg by mouth daily with breakfast.    . hydrochlorothiazide (HYDRODIURIL) 12.5 MG tablet Take 1 tablet (12.5 mg total) by mouth daily. 90 tablet 3  . levalbuterol (XOPENEX HFA) 45 MCG/ACT inhaler Inhale 1-2 puffs into the lungs every 4 (four) hours as needed for wheezing. 1 Inhaler 0  . levothyroxine (SYNTHROID) 88 MCG tablet Take 1 tablet (88 mcg total) by mouth daily before breakfast. 90 tablet 3  . linaclotide (LINZESS) 145 MCG CAPS capsule Take 145 mcg by mouth daily as needed (for constipation).     . mometasone (NASONEX) 50 MCG/ACT nasal spray Place 2 sprays into the nose daily. 1 g 0  . naproxen sodium (ALEVE) 220 MG tablet Take 440 mg by mouth daily as needed (for joint pain).     . nystatin-triamcinolone (MYCOLOG II) cream Apply 1 application topically 2 (two) times daily as needed (for yeast infection).    Marland Kitchen olmesartan (BENICAR) 40 MG tablet Take 1 tablet (40 mg total) by mouth daily. 90 tablet 3  . pantoprazole (PROTONIX) 40 MG tablet Take 1 tablet (40 mg total) by mouth daily. 30 tablet 11  . pantoprazole (PROTONIX) 40 MG tablet Take 1 tablet (40 mg total) by mouth daily. 30 tablet 5  . vitamin B-12 (CYANOCOBALAMIN) 1000 MCG tablet Take 1,000 mcg by mouth daily.      No current  facility-administered medications for this visit.    ALLERGIES:  Allergies  Allergen Reactions  . Codeine Nausea Only and Other (See Comments)    fever    PHYSICAL EXAM:  Performance status (ECOG): 1 - Symptomatic but completely ambulatory  Vitals:   03/31/20 1106  BP: 101/64  Pulse: (!) 56  Resp: 18  Temp: (!) 97.5 F (36.4 C)  SpO2: 100%   Wt Readings from Last 3 Encounters:  03/31/20 152 lb 1.6 oz (69 kg)  02/10/20 152 lb 12.8 oz (69.3 kg)  12/17/19 157 lb 6.4 oz (71.4 kg)   Physical Exam Vitals reviewed.  Constitutional:      Appearance: Normal appearance.  Cardiovascular:     Rate and Rhythm: Normal rate and regular rhythm.     Pulses: Normal pulses.     Heart sounds: Normal heart sounds.  Pulmonary:     Effort: Pulmonary effort is normal.     Breath sounds: Normal breath sounds.  Abdominal:     Palpations: Abdomen is soft. There is no mass.     Tenderness: There is no abdominal tenderness.  Musculoskeletal:     Cervical back: Normal range of motion and neck supple. No rigidity or tenderness.  Lymphadenopathy:     Cervical: No cervical adenopathy.  Neurological:     General: No focal deficit present.     Mental Status: She is alert and oriented to person, place, and time.  Psychiatric:        Mood and Affect: Mood normal.        Behavior: Behavior normal.      LABORATORY DATA:  I have reviewed the labs as listed.  CBC Latest Ref Rng & Units 03/27/2020 08/29/2019 02/26/2019  WBC 4.0 - 10.5 K/uL 5.1 5.4 4.6  Hemoglobin 12.0 - 15.0 g/dL 12.3 12.2 12.2  Hematocrit 36 - 46 %  38.6 40.6 39.3  Platelets 150 - 400 K/uL 218 233 207   CMP Latest Ref Rng & Units 03/27/2020 08/29/2019 02/26/2019  Glucose 70 - 99 mg/dL 114(H) 97 104(H)  BUN 8 - 23 mg/dL 20 20 24(H)  Creatinine 0.44 - 1.00 mg/dL 1.22(H) 1.35(H) 1.26(H)  Sodium 135 - 145 mmol/L 142 142 142  Potassium 3.5 - 5.1 mmol/L 3.3(L) 3.5 3.4(L)  Chloride 98 - 111 mmol/L 101 101 104  CO2 22 - 32 mmol/L 33(H) 29  32  Calcium 8.9 - 10.3 mg/dL 10.2 9.9 9.6  Total Protein 6.5 - 8.1 g/dL 7.8 7.7 7.4  Total Bilirubin 0.3 - 1.2 mg/dL 0.9 0.3 0.7  Alkaline Phos 38 - 126 U/L 59 57 48  AST 15 - 41 U/L 21 16 16   ALT 0 - 44 U/L 21 13 13     DIAGNOSTIC IMAGING:  I have independently reviewed the scans and discussed with the patient. CT SOFT TISSUE NECK W CONTRAST  Result Date: 03/28/2020 CLINICAL DATA:  Paraganglioma left carotid treated with embolization in surgery in February of 2019. EXAM: CT NECK WITH CONTRAST TECHNIQUE: Multidetector CT imaging of the neck was performed using the standard protocol following the bolus administration of intravenous contrast. CONTRAST:  41m OMNIPAQUE IOHEXOL 300 MG/ML  SOLN COMPARISON:  02/26/2019.  02/07/2018. FINDINGS: Pharynx and larynx: No mucosal or submucosal lesion. Salivary glands: Parotid and submandibular glands are normal except for atrophic change of the right submandibular. Thyroid: Mild asymmetric prominence of the left lobe compared to the right. No focal mass. Lymph nodes: No enlarged or low-density nodes on either side of the neck. Vascular: Atherosclerotic change at both carotid bifurcations but without visible stenosis. No evidence of residual or recurrent carotid body tumor on the left. Limited intracranial: Normal Visualized orbits: Normal Mastoids and visualized paranasal sinuses: Clear Skeleton: Ordinary cervical spondylosis as seen previously. Upper chest: Normal Other: None IMPRESSION: Negative study. No residual or recurrent carotid body tumor on the left. Electronically Signed   By: MNelson ChimesM.D.   On: 03/28/2020 18:35     ASSESSMENT:  1.  Paraganglioma of the left carotid artery: -Left carotid body biopsy on 10/27/2017 shows paraganglioma.  Status was not mentioned. -Work-up with 24-hour urine metanephrines and normetanephrine's were within normal limits. -Genetic testing for SDH and BHL was negative.  It showed VUS in KIT and STK11. -CT soft tissue  neck on 03/27/2020 did not show any evidence of recurrence or residual carotid body tumor.   PLAN:  1.  Paraganglioma of the left carotid artery: -I have reviewed results of the CT scan.  Physical examination did not reveal any palpable lymphadenopathy or masses. -I have reviewed her labs which are grossly within normal limits. -We will see her back in 1 year repeat CT scan.  2.  CKD: -Creatinine today is 1.22 and stable.  Avoid nephrotoxins.    Orders placed this encounter:  No orders of the defined types were placed in this encounter.    SDerek Jack MD [phone]  I, DMilinda Antis am acting as a scribe for Dr. SSanda Linger  I, SDerek JackMD, have reviewed the above documentation for accuracy and completeness, and I agree with the above.

## 2020-03-31 NOTE — Patient Instructions (Signed)
Heather Cobb at [facility] Discharge Instructions  You were seen today by Dr. Delton Coombes. He went over your recent results and scans. Drink plenty of fluids daily and do not take NSAID over the counter, like Aleve, and instead take Tylenol as needed. You will receive another CT scan before your next visit. Dr. Delton Coombes will see you back in 1 year for labs and follow up.   Thank you for choosing [facility] at [facility] to provide your oncology and hematology care.  To afford each patient quality time with our provider, please arrive at least 15 minutes before your scheduled appointment time.   If you have a lab appointment with the [facility] please come in thru the Main Entrance and check in at the main information desk  You need to re-schedule your appointment should you arrive 10 or more minutes late.  We strive to give you quality time with our providers, and arriving late affects you and other patients whose appointments are after yours.  Also, if you no show three or more times for appointments you may be dismissed from the clinic at the providers discretion.     Again, thank you for choosing [facility].  Our hope is that these requests will decrease the amount of time that you wait before being seen by our physicians.       _____________________________________________________________  Should you have questions after your visit to [facility], please contact our office at [phone] between the hours of 8:00 a.m. and 4:30 p.m.  Voicemails left after 4:00 p.m. will not be returned until the following business day.  For prescription refill requests, have your pharmacy contact our office and allow 72 hours.    Cancer Center Support Programs:   > Cancer Support Group  2nd Tuesday of the month 1pm-2pm, Journey Room

## 2020-04-09 ENCOUNTER — Ambulatory Visit (INDEPENDENT_AMBULATORY_CARE_PROVIDER_SITE_OTHER): Payer: Medicare HMO | Admitting: Family Medicine

## 2020-04-09 ENCOUNTER — Other Ambulatory Visit: Payer: Self-pay

## 2020-04-09 VITALS — BP 108/62 | HR 56 | Temp 97.3°F | Wt 150.5 lb

## 2020-04-09 DIAGNOSIS — R7309 Other abnormal glucose: Secondary | ICD-10-CM

## 2020-04-09 DIAGNOSIS — R7303 Prediabetes: Secondary | ICD-10-CM

## 2020-04-09 DIAGNOSIS — N898 Other specified noninflammatory disorders of vagina: Secondary | ICD-10-CM

## 2020-04-09 DIAGNOSIS — I10 Essential (primary) hypertension: Secondary | ICD-10-CM

## 2020-04-09 DIAGNOSIS — E875 Hyperkalemia: Secondary | ICD-10-CM

## 2020-04-09 LAB — POCT GLYCOSYLATED HEMOGLOBIN (HGB A1C): Hemoglobin A1C: 5.8 % — AB (ref 4.0–5.6)

## 2020-04-09 NOTE — Assessment & Plan Note (Signed)
Previously with BV however, small amount and no improvement with treatment. Persisting x years. Advised GYN evaluation to see if another cause or treatment could improve symptoms.

## 2020-04-09 NOTE — Assessment & Plan Note (Signed)
Reviewed glucose x 6 years which have been 90-120. Discussed option of metformin but would recommend focusing on healthy diet and weight/exercise. Repeat 1 year.

## 2020-04-09 NOTE — Progress Notes (Signed)
Subjective:     Heather Cobb is a 79 y.o. female presenting for Follow-up (wants A1C and potassium checked)     HPI  # High glucose - was told her glucose was high with her labs for cancer treatment  #vaginal itching - bad all the time  - has been on and off - using OTC cream w/o improvement - was treated for BV w/o improvement  #Low potassium - low on recent labs - taking HCTZ - does notice swelling when she skips her weekend dose, but not bothersome  Review of Systems   Social History   Tobacco Use  Smoking Status Never Smoker  Smokeless Tobacco Never Used        Objective:    BP Readings from Last 3 Encounters:  04/09/20 (!) 108/62  03/31/20 101/64  02/10/20 100/60   Wt Readings from Last 3 Encounters:  04/09/20 150 lb 8 oz (68.3 kg)  03/31/20 152 lb 1.6 oz (69 kg)  02/10/20 152 lb 12.8 oz (69.3 kg)    BP (!) 108/62   Pulse 56   Temp (!) 97.3 F (36.3 C) (Temporal)   Wt 150 lb 8 oz (68.3 kg)   SpO2 98%   BMI 25.43 kg/m    Physical Exam Constitutional:      General: She is not in acute distress.    Appearance: She is well-developed. She is not diaphoretic.  HENT:     Right Ear: External ear normal.     Left Ear: External ear normal.  Eyes:     Conjunctiva/sclera: Conjunctivae normal.  Cardiovascular:     Rate and Rhythm: Normal rate and regular rhythm.     Heart sounds: No murmur heard.   Pulmonary:     Effort: Pulmonary effort is normal. No respiratory distress.     Breath sounds: Normal breath sounds.  Musculoskeletal:     Cervical back: Neck supple.  Skin:    General: Skin is warm and dry.     Capillary Refill: Capillary refill takes less than 2 seconds.  Neurological:     Mental Status: She is alert. Mental status is at baseline.  Psychiatric:        Mood and Affect: Mood normal.        Behavior: Behavior normal.           Assessment & Plan:   Problem List Items Addressed This Visit      Cardiovascular and  Mediastinum   Essential hypertension, benign    BP is low and hypokalemia. Stop HCTZ and continue to monitor. Return in 1-2 weeks for Potassium check. Return in 3 months for bp check. Cont olmesartan      Relevant Orders   Basic metabolic panel     Musculoskeletal and Integument   Vaginal itching    Previously with BV however, small amount and no improvement with treatment. Persisting x years. Advised GYN evaluation to see if another cause or treatment could improve symptoms.       Relevant Orders   Ambulatory referral to Obstetrics / Gynecology     Other   Prediabetes - Primary    Reviewed glucose x 6 years which have been 90-120. Discussed option of metformin but would recommend focusing on healthy diet and weight/exercise. Repeat 1 year.        Other Visit Diagnoses    Abnormal glucose       Relevant Orders   POCT glycosylated hemoglobin (Hb A1C) (Completed)   Hyperkalemia  Relevant Orders   Basic metabolic panel       Return in about 3 months (around 07/10/2020).  Lesleigh Noe, MD  This visit occurred during the SARS-CoV-2 public health emergency.  Safety protocols were in place, including screening questions prior to the visit, additional usage of staff PPE, and extensive cleaning of exam room while observing appropriate contact time as indicated for disinfecting solutions.

## 2020-04-09 NOTE — Assessment & Plan Note (Addendum)
BP is low and hypokalemia. Stop HCTZ and continue to monitor. Return in 1-2 weeks for Potassium check. Return in 3 months for bp check. Cont olmesartan

## 2020-04-09 NOTE — Patient Instructions (Signed)
#  Referral I have placed a referral to a specialist for you. You should receive a phone call from the specialty office. Make sure your voicemail is not full and that if you are able to answer your phone to unknown or new numbers.   It may take up to 2 weeks to hear about the referral. If you do not hear anything in 2 weeks, please call our office and ask to speak with the referral coordinator.    #Blood pressure - stop the HCTZ - monitor for swelling - consider compression socks - call if blood pressure elevated - return in 1-2 weeks for labs  #Prediabetes Eat healthy foods Get at least 150 minutes of moderate aerobic physical activity a week, or about 30 minutes on most days of the week Lose excess weight Control your blood pressure and cholesterol Don't smoke  We should repeat this test next year to monitor for changes.

## 2020-04-10 ENCOUNTER — Telehealth: Payer: Self-pay | Admitting: Obstetrics & Gynecology

## 2020-04-10 NOTE — Telephone Encounter (Signed)
[facility] referring for Vaginal itching. Called and left voicemail for patient to call back to be scheduled.

## 2020-04-27 ENCOUNTER — Telehealth: Payer: Self-pay

## 2020-04-27 DIAGNOSIS — I10 Essential (primary) hypertension: Secondary | ICD-10-CM

## 2020-04-27 MED ORDER — SPIRONOLACTONE 25 MG PO TABS: 25.0000 mg | ORAL_TABLET | Freq: Every day | ORAL | 3 refills | Status: DC

## 2020-04-27 NOTE — Telephone Encounter (Signed)
Patient states the HCTZ was discontinued due to depleting her potassium - but she states since stopping this, she is retaining fluid all over - mainly in her legs and feet, and as well as her hands, fingers, and some in her face. Patient is wondering what she needs to do.

## 2020-04-27 NOTE — Addendum Note (Signed)
Addended by: Waunita Schooner R on: 04/27/2020 12:51 PM   Modules accepted: Orders

## 2020-04-28 ENCOUNTER — Encounter: Payer: Self-pay | Admitting: Obstetrics & Gynecology

## 2020-04-28 ENCOUNTER — Other Ambulatory Visit (HOSPITAL_COMMUNITY)
Admission: RE | Admit: 2020-04-28 | Discharge: 2020-04-28 | Disposition: A | Discharge status: Home / Self Care | Payer: Medicare HMO | Source: Ambulatory Visit | Attending: Obstetrics & Gynecology | Admitting: Obstetrics & Gynecology

## 2020-04-28 ENCOUNTER — Other Ambulatory Visit: Payer: Self-pay

## 2020-04-28 ENCOUNTER — Ambulatory Visit (INDEPENDENT_AMBULATORY_CARE_PROVIDER_SITE_OTHER): Payer: Medicare HMO | Admitting: Obstetrics & Gynecology

## 2020-04-28 VITALS — BP 132/70 | Ht 64.5 in | Wt 154.6 lb

## 2020-04-28 DIAGNOSIS — L309 Dermatitis, unspecified: Secondary | ICD-10-CM | POA: Diagnosis not present

## 2020-04-28 DIAGNOSIS — Z1272 Encounter for screening for malignant neoplasm of vagina: Secondary | ICD-10-CM | POA: Insufficient documentation

## 2020-04-28 DIAGNOSIS — N763 Subacute and chronic vulvitis: Secondary | ICD-10-CM | POA: Diagnosis not present

## 2020-04-28 DIAGNOSIS — L292 Pruritus vulvae: Secondary | ICD-10-CM | POA: Diagnosis not present

## 2020-04-28 DIAGNOSIS — N904 Leukoplakia of vulva: Secondary | ICD-10-CM | POA: Diagnosis not present

## 2020-04-28 NOTE — Progress Notes (Signed)
HPI:      [name] is a 79 y.o. G2P1011, No LMP recorded. Patient has had a hysterectomy., presents today for a problem visit.  She complains of:  Vulvar concern:   This is a 79 y.o. old Caucasian/White female who presents for the evaluation of vulvar itching of many years.  She was on Nystain cream for a long time but it is less effective now.  Recent change to Vagisil, no help.  Has not had exam in recent years and does not recall skin changes or also reports no rash or bumps.  Not sexually active.  PMHx: She  has a past medical history of Anemia, Carotid body tumor ([place]), Family history of colon cancer, Hypertension, Hypothyroid, Pyelonephritis, Rectal bleeding (11/26/2015), Vertigo, and Vitamin B 12 deficiency. Also,  has a past surgical history that includes Abdominal hysterectomy; Vesicovaginal fistula closure w/ TAH; Colonoscopy (2012); Upper gastrointestinal endoscopy (2014); Colonoscopy with propofol (N/A, 02/03/2016); IR Radiologist Eval & Mgmt (09/21/2017); Cataract extraction w/ intraocular lens  implant, bilateral; Radiology with anesthesia (N/A, 10/26/2017); Mass excision (Left, 10/27/2017); IR Transcath/Emboliz (10/26/2017); IR Angiogram Extremity Left (10/26/2017); IR ANGIO INTRA EXTRACRAN SEL COM CAROTID INNOMINATE BILAT MOD SED (10/26/2017); IR ANGIO VERTEBRAL SEL VERTEBRAL UNI R MOD SED (10/26/2017); IR ANGIO EXTERNAL CAROTID SEL EXT CAROTID UNI L MOD SED (10/26/2017); IR Angiogram Follow Up Study (10/26/2017); IR NEURO EACH ADD'L AFTER BASIC UNI LEFT (MS) (10/26/2017); Cholecystectomy (N/A, 04/15/2015); Eye surgery (Bilateral); Hemorrhoid surgery (N/A, 08/31/2018); and Sigmoidoscopy (N/A, 08/31/2018)., family history includes Asthma in her brother and mother; Colon cancer in her father and maternal uncle; Heart attack in her maternal grandmother; Hypertension in her daughter; Kidney failure in her mother; Stomach cancer in her sister.,  reports that she has never smoked. She has never used smokeless  tobacco. She reports that she does not drink alcohol and does not use drugs.  She has a current medication list which includes the following prescription(s): acetaminophen, albuterol, albuterol, aspirin ec, benzonatate, cholecalciferol, ferrous sulfate, levalbuterol, levothyroxine, linaclotide, mometasone, nystatin-triamcinolone, olmesartan, pantoprazole, pantoprazole, spironolactone, and vitamin b-12. Also, is allergic to codeine.  Review of Systems  Constitutional: Negative for chills, fever and malaise/fatigue.  HENT: Negative for congestion, sinus pain and sore throat.   Eyes: Negative for blurred vision and pain.  Respiratory: Negative for cough and wheezing.   Cardiovascular: Negative for chest pain and leg swelling.  Gastrointestinal: Negative for abdominal pain, constipation, diarrhea, heartburn, nausea and vomiting.  Genitourinary: Negative for dysuria, frequency, hematuria and urgency.  Musculoskeletal: Negative for back pain, joint pain, myalgias and neck pain.  Skin: Negative for itching and rash.  Neurological: Negative for dizziness, tremors and weakness.  Endo/Heme/Allergies: Does not bruise/bleed easily.  Psychiatric/Behavioral: Negative for depression. The patient is not nervous/anxious and does not have insomnia.     Objective: BP 132/70   Ht 5' 4.5" (1.638 m)   Wt 154 lb 9.6 oz (70.1 kg)   BMI 26.13 kg/m  Physical Exam Constitutional:      General: She is not in acute distress.    Appearance: She is well-developed.  Genitourinary:     Pelvic exam was performed with patient supine.     Vagina normal.     No vaginal erythema or bleeding.     Genitourinary Comments: Pale/pink skin changes of vulva and peri-anal and perineum, spotty but abundant in appearance No other skin lesions Cuff intact/ no lesions Absent uterus and cervix  HENT:     Head: Normocephalic and atraumatic.  Nose: Nose normal.  Abdominal:     General: There is no distension.      Palpations: Abdomen is soft.     Tenderness: There is no abdominal tenderness.  Musculoskeletal:        General: Normal range of motion.  Neurological:     Mental Status: She is alert and oriented to person, place, and time.     Cranial Nerves: No cranial nerve deficit.  Skin:    General: Skin is warm and dry.  Psychiatric:        Attention and Perception: Attention normal.        Mood and Affect: Mood normal.        Speech: Speech normal.        Behavior: Behavior normal.        Cognition and Memory: Cognition normal.        Judgment: Judgment normal.     ASSESSMENT/PLAN:  New concern for investigation  Problem List Items Addressed This Visit       Vulvar itching    -   Relevant Orders   Surgical pathology   Vulvar dermatitis       Relevant Orders   Surgical pathology    VULVAR BIOPSY NOTE The indications for vulvar biopsy (rule out neoplasia, establish lichen sclerosus diagnosis) were reviewed.   Risks of the biopsy including pain, bleeding, infection, inadequate specimen, scarring and need for additional procedures  were discussed. The patient stated understanding and agreed to undergo procedure today. Consent was signed,  time out performed.   The patient's vulva was prepped with Betadine. 1% lidocaine was injected into area of concern. A 3 -mm punch biopsy was done, biopsy tissue was picked up with sterile forceps and sterile scissors were used to excise the lesion.  Small bleeding was noted and hemostasis was achieved using silver nitrate sticks.  The patient tolerated the procedure well. Post-procedure instructions  (pelvic rest for one week) were given to the patient. The patient is to call with heavy bleeding, fever greater than 100.4, foul smelling vaginal discharge or other concerns.   Lichen planus or sclerosus, VIN, and Pagets to rule out w biopsy Consider Lotrisone therapy if normal biopsy  Barnett Applebaum, MD, [facility], [facility] 04/28/2020  4:07 PM

## 2020-04-30 LAB — CYTOLOGY - PAP
Adequacy: ABSENT
Diagnosis: NEGATIVE

## 2020-05-01 ENCOUNTER — Other Ambulatory Visit (INDEPENDENT_AMBULATORY_CARE_PROVIDER_SITE_OTHER): Payer: Medicare HMO

## 2020-05-01 ENCOUNTER — Other Ambulatory Visit: Payer: Self-pay

## 2020-05-01 DIAGNOSIS — I10 Essential (primary) hypertension: Secondary | ICD-10-CM

## 2020-05-01 LAB — BASIC METABOLIC PANEL
BUN: 12 mg/dL (ref 6–23)
CO2: 31 mEq/L (ref 19–32)
Calcium: 9.9 mg/dL (ref 8.4–10.5)
Chloride: 105 mEq/L (ref 96–112)
Creatinine, Ser: 1.21 mg/dL — ABNORMAL HIGH (ref 0.40–1.20)
GFR: 51.88 mL/min — ABNORMAL LOW (ref >60.00)
Glucose, Bld: 80 mg/dL (ref 70–99)
Potassium: 3.4 mEq/L — ABNORMAL LOW (ref 3.5–5.1)
Sodium: 145 mEq/L (ref 135–145)

## 2020-05-01 LAB — SURGICAL PATHOLOGY

## 2020-05-04 ENCOUNTER — Encounter: Payer: Self-pay | Admitting: Family Medicine

## 2020-05-11 ENCOUNTER — Other Ambulatory Visit: Payer: Self-pay | Admitting: Obstetrics & Gynecology

## 2020-05-11 MED ORDER — CLOTRIMAZOLE-BETAMETHASONE 1-0.05 % EX CREA: 1.0000 "application " | TOPICAL_CREAM | Freq: Two times a day (BID) | CUTANEOUS | 3 refills | Status: DC

## 2020-05-12 ENCOUNTER — Ambulatory Visit: Payer: Medicare HMO | Admitting: Family Medicine

## 2020-05-12 ENCOUNTER — Telehealth: Payer: Self-pay | Admitting: Family Medicine

## 2020-05-12 ENCOUNTER — Other Ambulatory Visit: Payer: Self-pay

## 2020-05-12 NOTE — Telephone Encounter (Signed)
As diarrhea likely a medication effect ok for cancellation and rescheduling for in-person tomorrow as long as she is feeling up for it. Could also discuss urine drop off and virtual if she prefers.

## 2020-05-12 NOTE — Telephone Encounter (Signed)
Patient called.  Patient has appointment today at 11:40 for a UTI.  Patient said she was constipated this morning and took a Linzess. She now has diarrhea. Patient would like to reschedule her appointment to tomorrow.  Can patient come in to office since she has diarrhea? Can today's appointment be cancelled?

## 2020-05-13 ENCOUNTER — Ambulatory Visit (INDEPENDENT_AMBULATORY_CARE_PROVIDER_SITE_OTHER): Payer: Medicare HMO | Admitting: Family Medicine

## 2020-05-13 ENCOUNTER — Encounter: Payer: Self-pay | Admitting: Family Medicine

## 2020-05-13 VITALS — BP 110/80 | HR 65 | Temp 97.3°F | Wt 149.0 lb

## 2020-05-13 DIAGNOSIS — M545 Low back pain, unspecified: Secondary | ICD-10-CM

## 2020-05-13 DIAGNOSIS — I10 Essential (primary) hypertension: Secondary | ICD-10-CM

## 2020-05-13 DIAGNOSIS — J984 Other disorders of lung: Secondary | ICD-10-CM

## 2020-05-13 DIAGNOSIS — N3000 Acute cystitis without hematuria: Secondary | ICD-10-CM

## 2020-05-13 DIAGNOSIS — R05 Cough: Secondary | ICD-10-CM | POA: Diagnosis not present

## 2020-05-13 DIAGNOSIS — R053 Chronic cough: Secondary | ICD-10-CM

## 2020-05-13 LAB — POC URINALSYSI DIPSTICK (AUTOMATED)
Bilirubin, UA: NEGATIVE
Glucose, UA: NEGATIVE
Ketones, UA: NEGATIVE
Leukocytes, UA: NEGATIVE
Nitrite, UA: POSITIVE
Protein, UA: POSITIVE — AB
Spec Grav, UA: 1.02 (ref 1.010–1.025)
Urobilinogen, UA: 1 E.U./dL
pH, UA: 6 (ref 5.0–8.0)

## 2020-05-13 MED ORDER — BENZONATATE 100 MG PO CAPS: 100.0000 mg | ORAL_CAPSULE | Freq: Two times a day (BID) | ORAL | 0 refills | Status: DC | PRN

## 2020-05-13 MED ORDER — FLUCONAZOLE 150 MG PO TABS: 150.0000 mg | ORAL_TABLET | Freq: Once | ORAL | 0 refills | Status: AC

## 2020-05-13 MED ORDER — SULFAMETHOXAZOLE-TRIMETHOPRIM 800-160 MG PO TABS: 1.0000 | ORAL_TABLET | Freq: Two times a day (BID) | ORAL | 0 refills | Status: AC

## 2020-05-13 NOTE — Assessment & Plan Note (Signed)
BP and swelling improved on spironolactone. Cont medication

## 2020-05-13 NOTE — Assessment & Plan Note (Signed)
Follows with Dr. Shearon Stalls with suspected GERD cough though symptoms worsening recently while on PPI. Cont PPI and tessalon pearls prn

## 2020-05-13 NOTE — Progress Notes (Signed)
Subjective:     Heather Cobb is a 79 y.o. female presenting for Back Pain (x 5 days ) and odor with urinination (x 2 days )     HPI  #HTN - swelling is better - bp controlled - labs done on 8/13  #Chronic cough - was taking the reflux medication w/o improvement - worse with eating - tries to avoid certain triggers  - next pulmonary   #urinary - odor to urine - no burning - Saturday started having back pain - took tylenol - no known injury to the bed - urgency and frequency   Review of Systems   Social History   Tobacco Use  Smoking Status Never Smoker  Smokeless Tobacco Never Used        Objective:    BP Readings from Last 3 Encounters:  05/13/20 110/80  04/28/20 132/70  04/09/20 (!) 108/62   Wt Readings from Last 3 Encounters:  05/13/20 149 lb (67.6 kg)  04/28/20 154 lb 9.6 oz (70.1 kg)  04/09/20 150 lb 8 oz (68.3 kg)    BP 110/80   Pulse 65   Temp (!) 97.3 F (36.3 C) (Temporal)   Wt 149 lb (67.6 kg)   SpO2 99%   BMI 25.18 kg/m    Physical Exam Constitutional:      General: She is not in acute distress.    Appearance: She is well-developed. She is not diaphoretic.  HENT:     Right Ear: External ear normal.     Left Ear: External ear normal.     Nose: Nose normal.  Eyes:     Conjunctiva/sclera: Conjunctivae normal.  Cardiovascular:     Rate and Rhythm: Normal rate and regular rhythm.     Heart sounds: No murmur heard.   Pulmonary:     Effort: Pulmonary effort is normal. No respiratory distress.     Breath sounds: Normal breath sounds. No wheezing.  Abdominal:     General: Abdomen is flat. Bowel sounds are normal. There is no distension.     Palpations: Abdomen is soft.     Tenderness: There is no abdominal tenderness. There is no right CVA tenderness, left CVA tenderness, guarding or rebound.  Musculoskeletal:     Cervical back: Neck supple.  Skin:    General: Skin is warm and dry.     Capillary Refill: Capillary refill  takes less than 2 seconds.  Neurological:     Mental Status: She is alert. Mental status is at baseline.  Psychiatric:        Mood and Affect: Mood normal.        Behavior: Behavior normal.      UA: + nitrites, neg LE, +protein     Assessment & Plan:   Problem List Items Addressed This Visit      Cardiovascular and Mediastinum   Essential hypertension, benign    BP and swelling improved on spironolactone. Cont medication        Other   Chronic cough    Follows with Dr. Shearon Stalls with suspected GERD cough though symptoms worsening recently while on PPI. Cont PPI and tessalon pearls prn      Relevant Medications   benzonatate (TESSALON) 100 MG capsule    Other Visit Diagnoses    Acute cystitis without hematuria    -  Primary   Relevant Medications   sulfamethoxazole-trimethoprim (BACTRIM DS) 800-160 MG tablet   fluconazole (DIFLUCAN) 150 MG tablet   Scarring of lung  Relevant Medications   benzonatate (TESSALON) 100 MG capsule   Acute low back pain without sciatica, unspecified back pain laterality       Relevant Orders   POCT Urinalysis Dipstick (Automated) (Completed)   Urine Culture     Suspect urine infection. Given hx of hospital visit with prior will treat with bactrim and based on prior culture just incase though low suspicion for kidney involvement today based on exam. Back pain likely strain - advised tylenol and stretching and call if not improved for trial of PT.    Return if symptoms worsen or fail to improve.  Lesleigh Noe, MD  This visit occurred during the SARS-CoV-2 public health emergency.  Safety protocols were in place, including screening questions prior to the visit, additional usage of staff PPE, and extensive cleaning of exam room while observing appropriate contact time as indicated for disinfecting solutions.

## 2020-05-13 NOTE — Patient Instructions (Addendum)
Blood pressure  - continue spironolactone  Cough - continue as needed tessalon pearls  Urinary infection - take antibiotic  - drink lots of water - monitor for fevers, chills, nausea, worsening symptoms  Back pain - stretches and tylenol    Menopause Menopause may increase your risk for:  Loss of bone (osteoporosis), which causes bone breaks (fractures).  Depression.  Hardening and narrowing of the arteries (atherosclerosis), which can cause heart attacks and strokes. What are the causes? This condition is usually caused by a natural change in hormone levels that happens as you get older. The condition may also be caused by surgery to remove both ovaries (bilateral oophorectomy). Follow these instructions at home: Lifestyle  Do not use any products that contain nicotine or tobacco, such as cigarettes and e-cigarettes. If you need help quitting, ask your health care provider.  Get at least 30 minutes of physical activity on 5 or more days each week.  Avoid alcoholic and caffeinated beverages, as well as spicy foods. This may help prevent hot flashes.  Get 7-8 hours of sleep each night.  If you have hot flashes, try: ? Dressing in layers. ? Avoiding things that may trigger hot flashes, such as spicy food, hot drinks, alcohol, caffeine, warm places, or stress. ? Taking slow, deep breaths when a hot flash starts. ? Keeping a fan in your home and office.  Find ways to manage stress: regular exercise, meditation, yoga, qigong, Tai Chi, biofeedback, acupuncture or massage  Consider going to group therapy with other women who are having menopause symptoms. Ask your health care provider about recommended group therapy meetings.  Staying cool while sleeping: dress in light clothing, use layed bedding that can be easily removed, Sleep with a fan nearby, put an ice pack under your pillow and flip your pillow regularly  Eating and drinking  Eat a healthy, balanced diet that  contains whole grains, lean protein, low-fat dairy, and plenty of fruits and vegetables.  Your health care provider may recommend adding more soy to your diet. Foods that contain soy include tofu, tempeh, and soy milk.  Eat plenty of foods that contain calcium and vitamin D for bone health. Items that are rich in calcium include low-fat milk, yogurt, beans, almonds, sardines, broccoli, and kale. Medicines  Non-prescription medications for Hot Flashes ? Soy - eat 1-2 servings of soy foods daily ? Herbs: like black cohosh have shown some improvement with hot flashes  Talk with your health care provider before starting any herbal supplements. If prescribed, take vitamins and supplements as told by your health care provider. These may include: ? Calcium. Women age 79 and older should get 1,200 mg (milligrams) of calcium every day. ? Vitamin D. Women need 600-800 International Units of vitamin D each day.  Prescription Medications ? Hormone Treatment: increased risk of breast cancer and cardiovascular disease. Should be used for a short time and in women under 60 have a lower risk overall if they take it ? Non-Hormone Treatment: Paroxetine which is an antidepressant, has been shown to reduce Hot Flashes

## 2020-05-15 ENCOUNTER — Encounter: Payer: Self-pay | Admitting: Family Medicine

## 2020-05-15 LAB — URINE CULTURE
MICRO NUMBER:: 10870694
SPECIMEN QUALITY:: ADEQUATE

## 2020-06-08 ENCOUNTER — Encounter: Payer: Self-pay | Admitting: Family Medicine

## 2020-06-08 ENCOUNTER — Other Ambulatory Visit: Payer: Self-pay

## 2020-06-08 DIAGNOSIS — J984 Other disorders of lung: Secondary | ICD-10-CM

## 2020-06-08 DIAGNOSIS — I10 Essential (primary) hypertension: Secondary | ICD-10-CM

## 2020-06-08 DIAGNOSIS — R053 Chronic cough: Secondary | ICD-10-CM

## 2020-06-08 MED ORDER — LEVOTHYROXINE SODIUM 88 MCG PO TABS: 88.0000 ug | ORAL_TABLET | Freq: Every day | ORAL | 3 refills | Status: DC

## 2020-06-08 MED ORDER — SPIRONOLACTONE 25 MG PO TABS: 25.0000 mg | ORAL_TABLET | Freq: Every day | ORAL | 3 refills | Status: DC

## 2020-06-08 MED ORDER — BENZONATATE 100 MG PO CAPS: 100.0000 mg | ORAL_CAPSULE | Freq: Two times a day (BID) | ORAL | 0 refills | Status: DC | PRN

## 2020-06-22 ENCOUNTER — Telehealth: Payer: Self-pay | Admitting: *Deleted

## 2020-06-22 ENCOUNTER — Ambulatory Visit (INDEPENDENT_AMBULATORY_CARE_PROVIDER_SITE_OTHER)
Admission: RE | Admit: 2020-06-22 | Discharge: 2020-06-22 | Disposition: A | Discharge status: Home / Self Care | Payer: Medicare HMO | Source: Ambulatory Visit | Attending: Family Medicine | Admitting: Family Medicine

## 2020-06-22 ENCOUNTER — Encounter: Payer: Self-pay | Admitting: Family Medicine

## 2020-06-22 ENCOUNTER — Ambulatory Visit (INDEPENDENT_AMBULATORY_CARE_PROVIDER_SITE_OTHER): Payer: Medicare HMO | Admitting: Family Medicine

## 2020-06-22 ENCOUNTER — Other Ambulatory Visit: Payer: Self-pay

## 2020-06-22 VITALS — BP 140/86 | HR 67 | Temp 97.3°F | Wt 146.8 lb

## 2020-06-22 DIAGNOSIS — M7989 Other specified soft tissue disorders: Secondary | ICD-10-CM | POA: Diagnosis not present

## 2020-06-22 DIAGNOSIS — I10 Essential (primary) hypertension: Secondary | ICD-10-CM | POA: Diagnosis not present

## 2020-06-22 DIAGNOSIS — M25572 Pain in left ankle and joints of left foot: Secondary | ICD-10-CM | POA: Insufficient documentation

## 2020-06-22 DIAGNOSIS — Z23 Encounter for immunization: Secondary | ICD-10-CM

## 2020-06-22 LAB — BASIC METABOLIC PANEL
BUN: 16 mg/dL (ref 6–23)
CO2: 34 mEq/L — ABNORMAL HIGH (ref 19–32)
Calcium: 10.1 mg/dL (ref 8.4–10.5)
Chloride: 102 mEq/L (ref 96–112)
Creatinine, Ser: 1.15 mg/dL (ref 0.40–1.20)
GFR: 55 mL/min — ABNORMAL LOW (ref >60.00)
Glucose, Bld: 85 mg/dL (ref 70–99)
Potassium: 3.4 mEq/L — ABNORMAL LOW (ref 3.5–5.1)
Sodium: 143 mEq/L (ref 135–145)

## 2020-06-22 LAB — URIC ACID: Uric Acid, Serum: 5.2 mg/dL (ref 2.4–7.0)

## 2020-06-22 NOTE — Assessment & Plan Note (Signed)
Tenderness on exam and XR without clear fracture - though final read with possible avulsion. Suspect ankle sprain. Advised NSAIDs and bracing. Return if not improving.

## 2020-06-22 NOTE — Patient Instructions (Signed)
Ankle Sprain - Treat with anti-inflammatory medication like Ibuprofen or Aleve - Try ankle brace - compression or support - use if you have trouble walking or pain

## 2020-06-22 NOTE — Telephone Encounter (Signed)
Message was left on voicemail requesting a call back to get the call report on the ankle x-ray that was done. Called back and spoke to [place] and was advised that the report in Epic for Dr. Einar Pheasant.

## 2020-06-22 NOTE — Telephone Encounter (Signed)
Results viewed and patient informed via mychart

## 2020-06-22 NOTE — Progress Notes (Signed)
Subjective:     Heather Cobb is a 79 y.o. female presenting for Joint Swelling (L ankle x 1 week )     HPI   #Left ankle - swelling started 1 week ago - painful on the inner bone - no fall or known injury - no hx of injury to this foot - painful to walk first thing in the morning   Review of Systems   Social History   Tobacco Use  Smoking Status Never Smoker  Smokeless Tobacco Never Used        Objective:    BP Readings from Last 3 Encounters:  06/22/20 140/86  05/13/20 110/80  04/28/20 132/70   Wt Readings from Last 3 Encounters:  06/22/20 146 lb 12 oz (66.6 kg)  05/13/20 149 lb (67.6 kg)  04/28/20 154 lb 9.6 oz (70.1 kg)    BP 140/86    Pulse 67    Temp (!) 97.3 F (36.3 C) (Temporal)    Wt 146 lb 12 oz (66.6 kg)    SpO2 98%    BMI 24.80 kg/m    Physical Exam Constitutional:      General: She is not in acute distress.    Appearance: She is well-developed. She is not diaphoretic.  HENT:     Right Ear: External ear normal.     Left Ear: External ear normal.     Nose: Nose normal.  Eyes:     Conjunctiva/sclera: Conjunctivae normal.  Cardiovascular:     Rate and Rhythm: Normal rate.  Pulmonary:     Effort: Pulmonary effort is normal.  Musculoskeletal:     Cervical back: Neck supple.     Comments: Left Ankle: Inspection: Swelling along the medial malleolus, no erythema Palpation: TTP along the medial malleolous ROM: normal w/o pain Strength: normal w/o pain Ligaments: no laxity  Skin:    General: Skin is warm and dry.     Capillary Refill: Capillary refill takes less than 2 seconds.  Neurological:     Mental Status: She is alert. Mental status is at baseline.  Psychiatric:        Mood and Affect: Mood normal.        Behavior: Behavior normal.     DG Ankle Complete Left CLINICAL DATA:  Pain and swelling  EXAM: LEFT ANKLE COMPLETE - 3+ VIEW  COMPARISON:  None.  FINDINGS: Frontal, oblique, and lateral views were obtained. There  is soft tissue swelling. There is a focal calcification along the inferior distal calcaneus, a potential small avulsion. No other evident fracture. No joint joint spaces appear normal. No erosive change. Ankle mortise appears intact. A small calcification is noted dorsal to the distal talus which is well corticated.  IMPRESSION: Soft tissue swelling. Focal calcification volar to the distal calcaneus seen somewhat laterally on frontal and oblique views. Concern for focal avulsion in this area. No other findings suggesting potential acute fracture. A small focus of well corticated calcification is noted dorsal to the distal talus which may have arthropathic etiology or may represent residua of prior trauma. No appreciable joint space narrowing or erosion. Ankle mortise appears intact.  These results will be called to the ordering clinician or representative by the Radiologist Assistant, and communication documented in the PACS or [employer].  Electronically Signed   By: Lowella Grip III M.D.   On: 06/22/2020 09:56        Assessment & Plan:   Problem List Items Addressed This Visit  Cardiovascular and Mediastinum   Essential hypertension, benign    BP stable. Pt recently started on spironolactone. Given new joint pain w/o known trauma will get blood work and check uric acid - though exam not consistent with gout - as a precaution. Cont sprinonolaction, olmesartan      Relevant Orders   Basic metabolic panel     Other   Acute left ankle pain - Primary    Tenderness on exam and XR without clear fracture - though final read with possible avulsion. Suspect ankle sprain. Advised NSAIDs and bracing. Return if not improving.       Relevant Orders   DG Ankle Complete Left (Completed)   Basic metabolic panel   Uric acid    Other Visit Diagnoses    Need for 23-polyvalent pneumococcal polysaccharide vaccine       Relevant Orders   Pneumococcal polysaccharide  vaccine 23-valent greater than or equal to 2yo subcutaneous/IM (Completed)       Return if symptoms worsen or fail to improve.  Lesleigh Noe, MD  This visit occurred during the SARS-CoV-2 public health emergency.  Safety protocols were in place, including screening questions prior to the visit, additional usage of staff PPE, and extensive cleaning of exam room while observing appropriate contact time as indicated for disinfecting solutions.

## 2020-06-22 NOTE — Assessment & Plan Note (Signed)
BP stable. Pt recently started on spironolactone. Given new joint pain w/o known trauma will get blood work and check uric acid - though exam not consistent with gout - as a precaution. Cont sprinonolaction, olmesartan

## 2020-06-28 ENCOUNTER — Encounter: Payer: Self-pay | Admitting: Family Medicine

## 2020-06-29 MED ORDER — LINACLOTIDE 145 MCG PO CAPS: 145.0000 ug | ORAL_CAPSULE | Freq: Every day | ORAL | 3 refills | Status: DC | PRN

## 2020-08-05 ENCOUNTER — Encounter: Payer: Self-pay | Admitting: Family Medicine

## 2020-08-05 ENCOUNTER — Other Ambulatory Visit: Payer: Self-pay

## 2020-08-05 DIAGNOSIS — J984 Other disorders of lung: Secondary | ICD-10-CM

## 2020-08-05 DIAGNOSIS — R053 Chronic cough: Secondary | ICD-10-CM

## 2020-08-05 MED ORDER — BENZONATATE 100 MG PO CAPS: 100.0000 mg | ORAL_CAPSULE | Freq: Two times a day (BID) | ORAL | 0 refills | Status: DC | PRN

## 2020-08-05 MED ORDER — LINACLOTIDE 145 MCG PO CAPS: 145.0000 ug | ORAL_CAPSULE | Freq: Every day | ORAL | 3 refills | Status: DC | PRN

## 2020-10-08 ENCOUNTER — Other Ambulatory Visit: Payer: Self-pay | Admitting: Family Medicine

## 2020-10-08 DIAGNOSIS — J984 Other disorders of lung: Secondary | ICD-10-CM

## 2020-10-08 DIAGNOSIS — R053 Chronic cough: Secondary | ICD-10-CM

## 2020-10-29 ENCOUNTER — Ambulatory Visit: Admitting: Obstetrics & Gynecology

## 2020-10-30 ENCOUNTER — Encounter: Payer: Self-pay | Admitting: Obstetrics & Gynecology

## 2020-10-30 ENCOUNTER — Other Ambulatory Visit: Payer: Self-pay

## 2020-10-30 ENCOUNTER — Ambulatory Visit (INDEPENDENT_AMBULATORY_CARE_PROVIDER_SITE_OTHER): Payer: Medicare HMO | Admitting: Obstetrics & Gynecology

## 2020-10-30 VITALS — BP 130/80 | Ht 64.0 in | Wt 145.0 lb

## 2020-10-30 DIAGNOSIS — L309 Dermatitis, unspecified: Secondary | ICD-10-CM | POA: Diagnosis not present

## 2020-10-30 NOTE — Progress Notes (Signed)
History of Present Illness:  Heather Cobb is a 80 y.o. who was started on Lotrisone prn use for her vulvar itching approximately 6 months ago. Since that time, she states that her symptoms are improving.  Rarely has sx's, usually trigger by bubble bath.    2021 VULVA, BIOPSY:  - Mild chronic inflammation and slight hyperkeratosis  PMHx: She  has a past medical history of Anemia, Carotid body tumor ([place]), Family history of colon cancer, Hypertension, Hypothyroid, Pyelonephritis, Rectal bleeding (11/26/2015), Vertigo, and Vitamin B 12 deficiency. Also,  has a past surgical history that includes Abdominal hysterectomy; Vesicovaginal fistula closure w/ TAH; Colonoscopy (2012); Upper gastrointestinal endoscopy (2014); Colonoscopy with propofol (N/A, 02/03/2016); IR Radiologist Eval & Mgmt (09/21/2017); Cataract extraction w/ intraocular lens  implant, bilateral; Radiology with anesthesia (N/A, 10/26/2017); Mass excision (Left, 10/27/2017); IR Transcath/Emboliz (10/26/2017); IR Angiogram Extremity Left (10/26/2017); IR ANGIO INTRA EXTRACRAN SEL COM CAROTID INNOMINATE BILAT MOD SED (10/26/2017); IR ANGIO VERTEBRAL SEL VERTEBRAL UNI R MOD SED (10/26/2017); IR ANGIO EXTERNAL CAROTID SEL EXT CAROTID UNI L MOD SED (10/26/2017); IR Angiogram Follow Up Study (10/26/2017); IR NEURO EACH ADD'L AFTER BASIC UNI LEFT (MS) (10/26/2017); Cholecystectomy (N/A, 04/15/2015); Eye surgery (Bilateral); Hemorrhoid surgery (N/A, 08/31/2018); and Sigmoidoscopy (N/A, 08/31/2018)., family history includes Asthma in her brother and mother; Colon cancer in her father and maternal uncle; Heart attack in her maternal grandmother; Hypertension in her daughter; Kidney failure in her mother; Stomach cancer in her sister.,  reports that she has never smoked. She has never used smokeless tobacco. She reports that she does not drink alcohol and does not use drugs. Current Meds  Medication Sig  . acetaminophen (TYLENOL) 325 MG tablet Take 650 mg by mouth every 6 (six)  hours as needed.  Marland Kitchen albuterol (VENTOLIN HFA) 108 (90 Base) MCG/ACT inhaler Inhale 2 puffs into the lungs every 6 (six) hours as needed for wheezing or shortness of breath.  Marland Kitchen albuterol (VENTOLIN HFA) 108 (90 Base) MCG/ACT inhaler Inhale 2 puffs into the lungs every 6 (six) hours as needed.  Marland Kitchen aspirin EC 81 MG tablet Take 81 mg by mouth daily.  . benzonatate (TESSALON) 100 MG capsule TAKE ONE CAPSULE BY MOUTH TWICE A DAY AS NEEDED FOR COUGH  . cholecalciferol (VITAMIN D) 1000 UNITS tablet Take 1,000 Units by mouth daily.  . clotrimazole-betamethasone (LOTRISONE) cream Apply 1 application topically 2 (two) times daily.  . ferrous sulfate 325 (65 FE) MG tablet Take 325 mg by mouth daily with breakfast.  . levalbuterol (XOPENEX HFA) 45 MCG/ACT inhaler Inhale 1-2 puffs into the lungs every 4 (four) hours as needed for wheezing.  Marland Kitchen levothyroxine (SYNTHROID) 88 MCG tablet Take 1 tablet (88 mcg total) by mouth daily before breakfast.  . linaclotide (LINZESS) 145 MCG CAPS capsule Take 1 capsule (145 mcg total) by mouth daily as needed (for constipation).  . mometasone (NASONEX) 50 MCG/ACT nasal spray Place 2 sprays into the nose daily. (Patient taking differently: Place 2 sprays into the nose as needed.)  . olmesartan (BENICAR) 40 MG tablet Take 1 tablet (40 mg total) by mouth daily.  . pantoprazole (PROTONIX) 40 MG tablet Take 1 tablet (40 mg total) by mouth daily.  . pantoprazole (PROTONIX) 40 MG tablet Take 1 tablet (40 mg total) by mouth daily.  Marland Kitchen spironolactone (ALDACTONE) 25 MG tablet Take 1 tablet (25 mg total) by mouth daily.  . vitamin B-12 (CYANOCOBALAMIN) 1000 MCG tablet Take 1,000 mcg by mouth daily.   . Also, is allergic to codeine.Marland Kitchen  Review of Systems  All other systems reviewed and are negative.   Physical Exam:  BP 130/80   Ht 5\' 4"  (1.626 m)   Wt 145 lb (65.8 kg)   BMI 24.89 kg/m  Body mass index is 24.89 kg/m. Constitutional: Well nourished, well developed female in no acute  distress.  Abdomen: diffusely non tender to palpation, non distended, and no masses, hernias Neuro: Grossly intact Psych:  Normal mood and affect.    Assessment:  Problem List Items Addressed This Visit    Visit Diagnoses    Vulvar dermatitis    -  Primary     Medication treatment is going very well for her Vulvar dermatitis  Plan: She will undergo no change in her medical therapy. PRN use of Lotrisone, as long as it not too often utilized.  She was amenable to this plan and we will see her back for annual/PRN.  A total of 20 minutes were spent face-to-face with the patient as well as preparation, review, communication, and documentation during this encounter.   Barnett Applebaum, MD, [facility], [facility] 10/30/2020  10:14 AM

## 2020-11-09 DIAGNOSIS — Z1231 Encounter for screening mammogram for malignant neoplasm of breast: Secondary | ICD-10-CM | POA: Diagnosis not present

## 2020-11-09 LAB — HM MAMMOGRAPHY

## 2020-11-22 IMAGING — CT CT CHEST W/O CM
2 of 4 series · 15 of 36 positions shown, 18 images · non-contrast
Comparison: Chest radiographs 10/15/2019 and earlier. Neck CT
02/26/2019 and earlier. CT Abdomen and Pelvis 02/01/2013.

CLINICAL DATA: 78-year-old female with left lung lung base nodule
on recent chest x-ray. History of left carotid region paraganglioma.

EXAM:
CT CHEST WITHOUT CONTRAST
TECHNIQUE: Multidetector CT imaging of the chest was performed following the
standard protocol without IV contrast.

[Series 2: thorax · axial · 0.65mm/px · z∈[-278,-26]mm · 12 of 150 slices shown, 15 images]
[im 12/150  mediastinal]
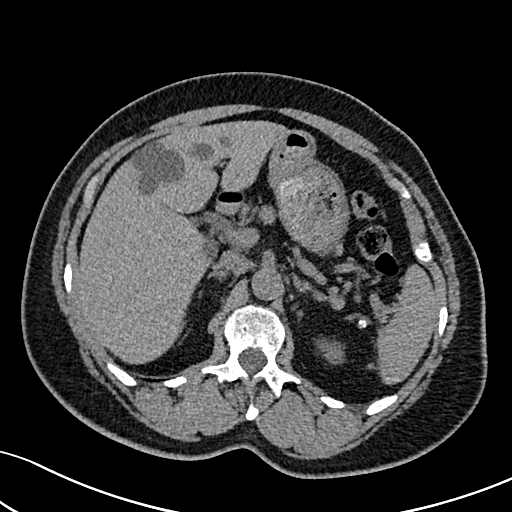
[im 12/150  lung]
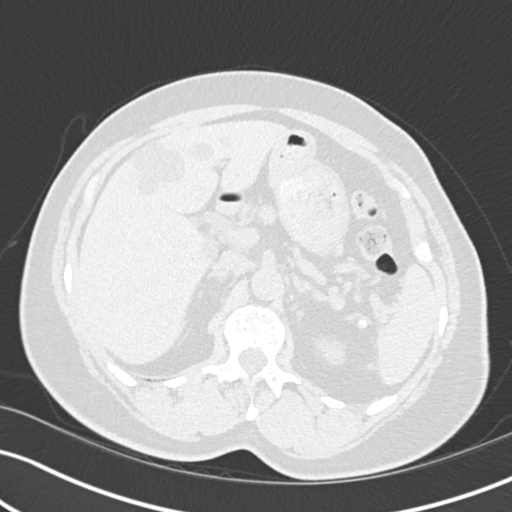
[im 23/150  lung]
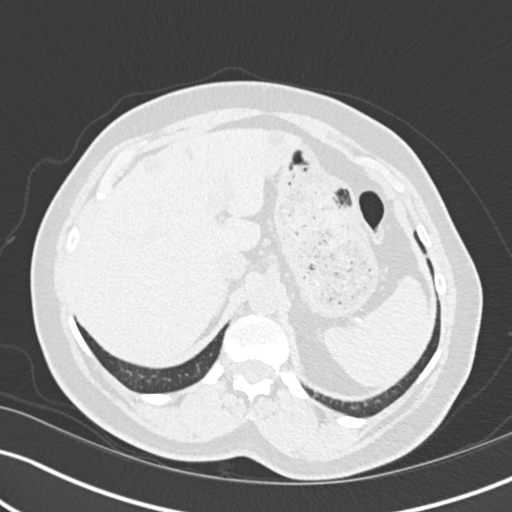
[im 35/150  lung]
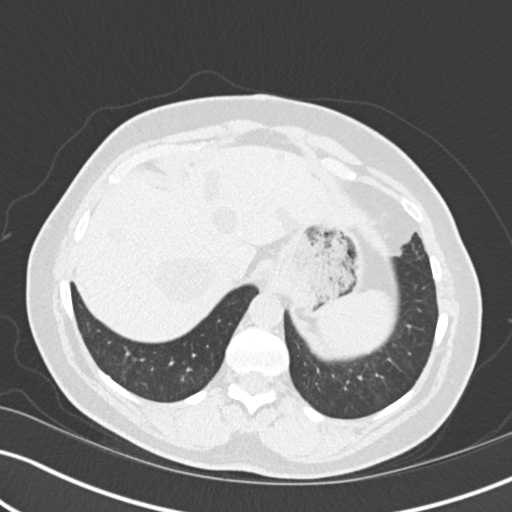
[im 46/150  lung]
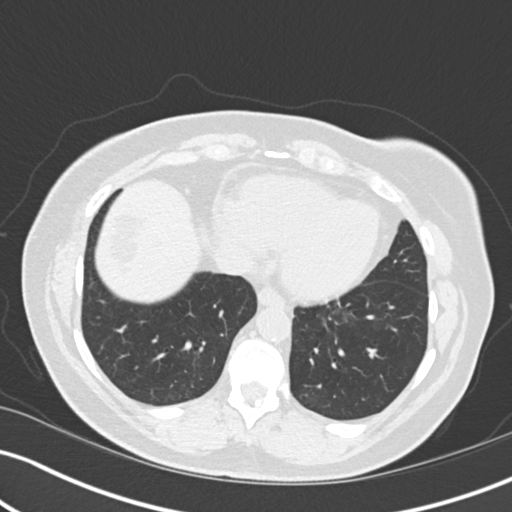
[im 58/150  mediastinal]
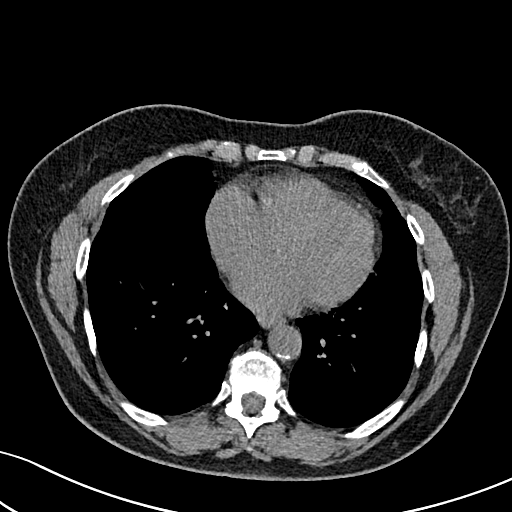
[im 58/150  lung]
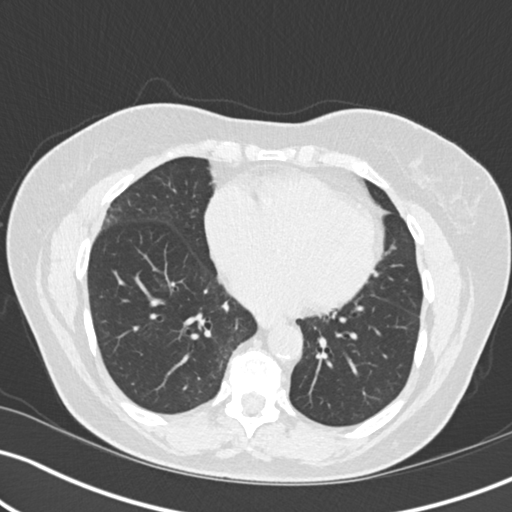
[im 69/150  lung]
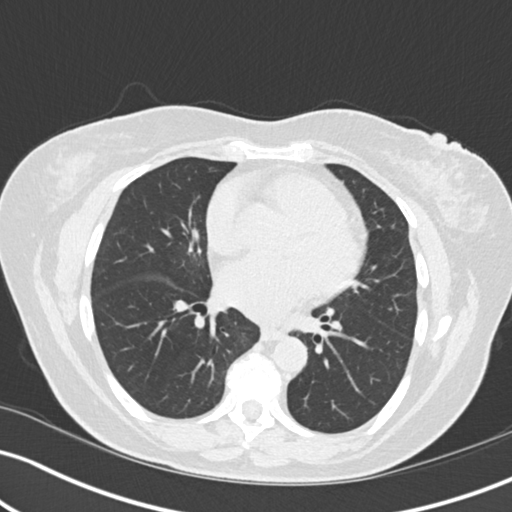
[im 81/150  lung]
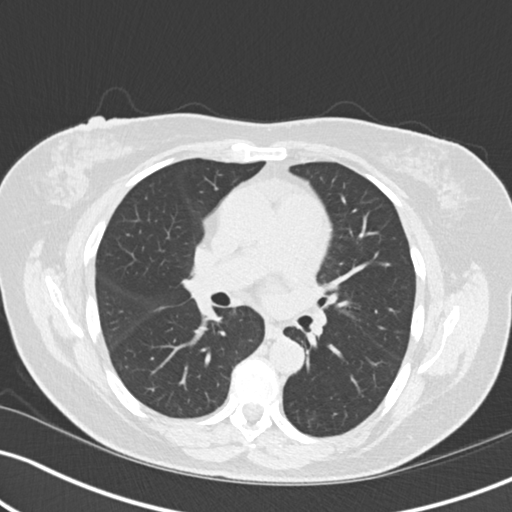
[im 92/150  lung]
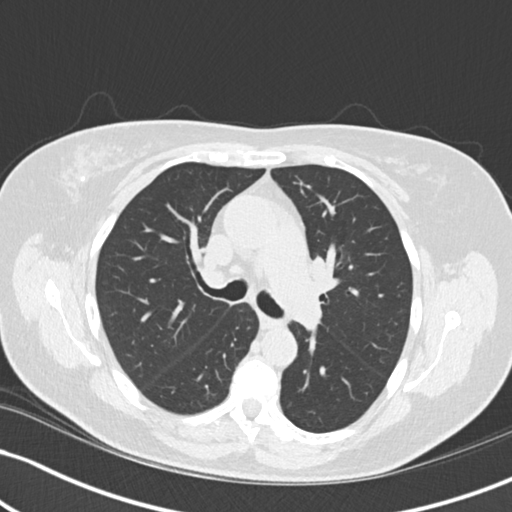
[im 104/150  mediastinal]
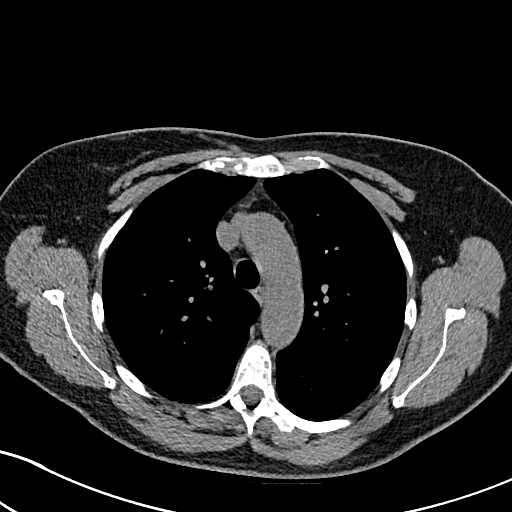
[im 104/150  lung]
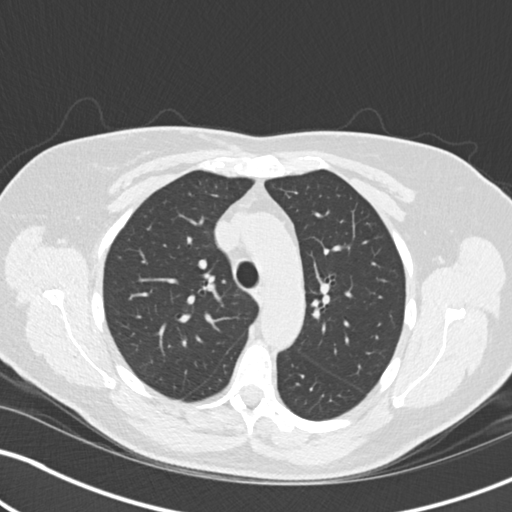
[im 115/150  lung]
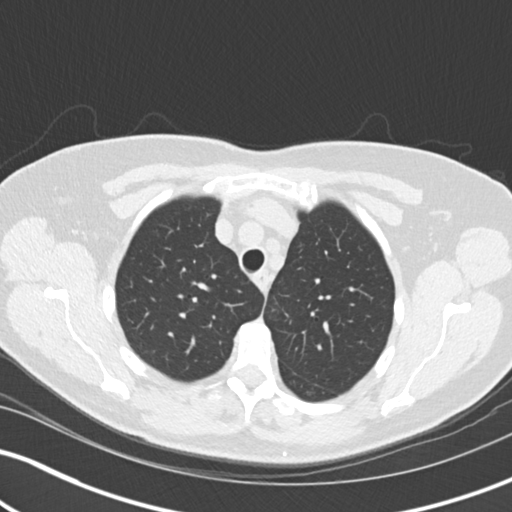
[im 127/150  lung]
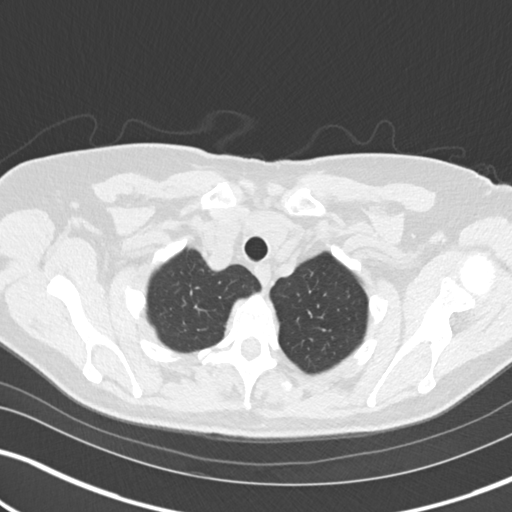
[im 138/150  lung]
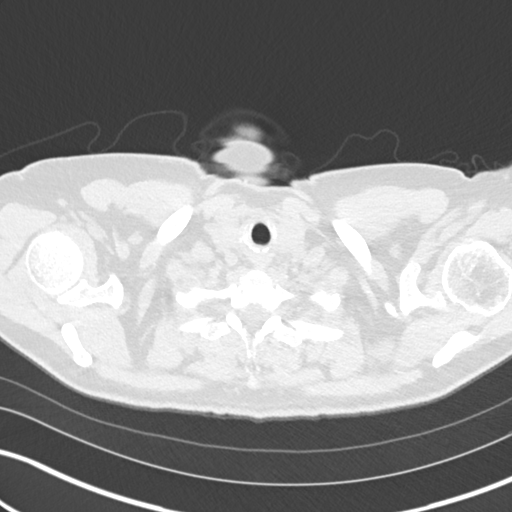

[Series 5: coronal · coronal · 0.59mm/px · 3 of 109 slices shown]
[im 22/109  lung]
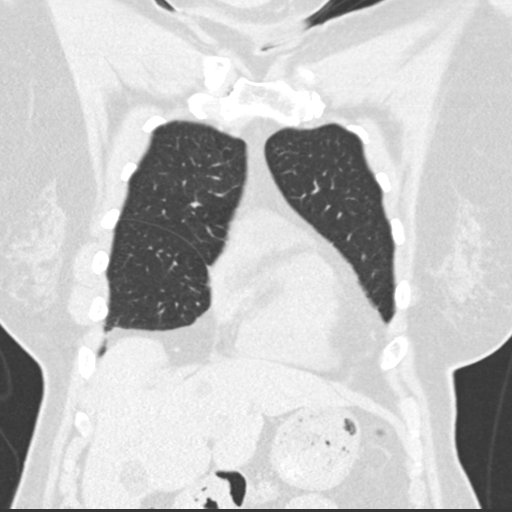
[im 44/109  lung]
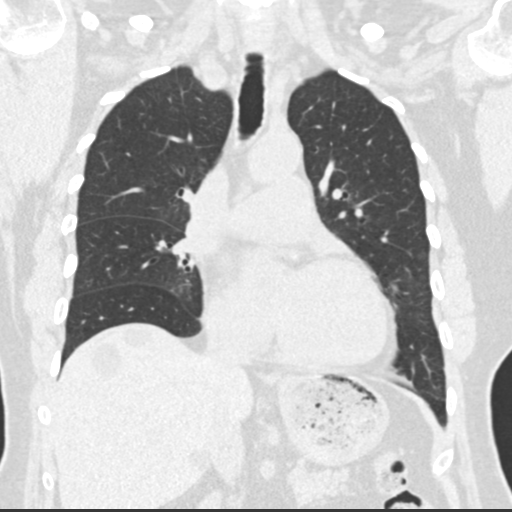
[im 65/109  lung]
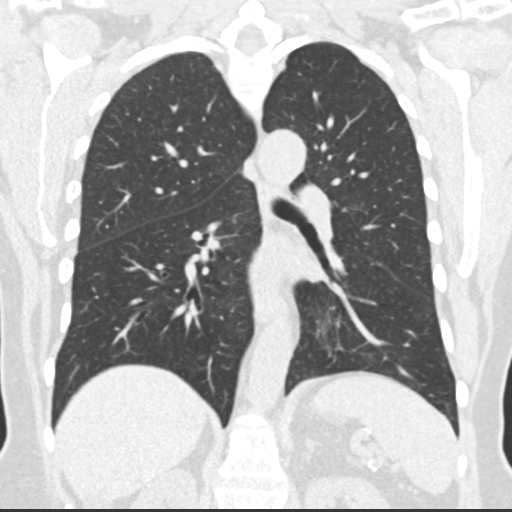

[15 of 36 positions shown; findings below may reference images not displayed]

FINDINGS: Cardiovascular: Calcified coronary artery atherosclerosis on series
2, image 76. No cardiomegaly or pericardial effusion. Lesser
calcified atherosclerosis of the thoracic aorta. Vascular patency is
not evaluated in the absence of IV contrast.

Mediastinum/Nodes: No mediastinal or hilar lymphadenopathy. Thoracic
inlet appears stable and negative.

Lungs/Pleura: The nodular density at the left lung base appears to
correspond to mild curvilinear scarring (coronal image 50). There is
no left lung pulmonary nodule.

Major airways are patent. Minimal scarring also in the lateral
segment of the right middle lobe. Both lungs are otherwise clear. No
pleural effusion.

Upper Abdomen: Polycystic liver changes are again noted, and the
largest hepatic cysts are stable to perhaps mildly decreased since
7796. Surgically absent gallbladder since that time. Negative
visible noncontrast spleen, pancreas, adrenal glands, kidneys and
bowel.

Musculoskeletal: Mild for age degenerative changes in the spine. No
acute or suspicious osseous lesion.
IMPRESSION: 1. Negative for left lung nodule. The nodular density at the left
lung base on the recent chest x-ray corresponds to mild pulmonary
scarring.
2. No acute findings in the chest. Calcified coronary artery
atherosclerosis.
3. Chronic benign polycystic liver disease.

## 2020-11-25 ENCOUNTER — Encounter: Payer: Self-pay | Admitting: Family Medicine

## 2020-12-16 ENCOUNTER — Other Ambulatory Visit: Payer: Self-pay | Admitting: Family Medicine

## 2020-12-16 DIAGNOSIS — R053 Chronic cough: Secondary | ICD-10-CM

## 2020-12-16 DIAGNOSIS — J984 Other disorders of lung: Secondary | ICD-10-CM

## 2021-01-14 ENCOUNTER — Other Ambulatory Visit: Payer: Self-pay | Admitting: Family Medicine

## 2021-01-14 ENCOUNTER — Ambulatory Visit (INDEPENDENT_AMBULATORY_CARE_PROVIDER_SITE_OTHER): Payer: Medicare HMO | Admitting: Family Medicine

## 2021-01-14 ENCOUNTER — Encounter: Payer: Self-pay | Admitting: Family Medicine

## 2021-01-14 ENCOUNTER — Other Ambulatory Visit: Payer: Self-pay

## 2021-01-14 VITALS — BP 150/80 | HR 53 | Temp 98.2°F | Ht 64.0 in | Wt 146.8 lb

## 2021-01-14 DIAGNOSIS — I10 Essential (primary) hypertension: Secondary | ICD-10-CM

## 2021-01-14 DIAGNOSIS — H9192 Unspecified hearing loss, left ear: Secondary | ICD-10-CM | POA: Insufficient documentation

## 2021-01-14 DIAGNOSIS — E039 Hypothyroidism, unspecified: Secondary | ICD-10-CM

## 2021-01-14 DIAGNOSIS — R232 Flushing: Secondary | ICD-10-CM | POA: Diagnosis not present

## 2021-01-14 DIAGNOSIS — N1831 Chronic kidney disease, stage 3a: Secondary | ICD-10-CM

## 2021-01-14 LAB — CBC WITH DIFFERENTIAL/PLATELET
Basophils Absolute: 0 10*3/uL (ref 0.0–0.1)
Basophils Relative: 0.4 % (ref 0.0–3.0)
Eosinophils Absolute: 0.1 10*3/uL (ref 0.0–0.7)
Eosinophils Relative: 2.7 % (ref 0.0–5.0)
HCT: 38.7 % (ref 36.0–46.0)
Hemoglobin: 12.9 g/dL (ref 12.0–15.0)
Lymphocytes Relative: 32.9 % (ref 12.0–46.0)
Lymphs Abs: 1.6 10*3/uL (ref 0.7–4.0)
MCHC: 33.4 g/dL (ref 30.0–36.0)
MCV: 86.5 fl (ref 78.0–100.0)
Monocytes Absolute: 0.4 10*3/uL (ref 0.1–1.0)
Monocytes Relative: 8.6 % (ref 3.0–12.0)
Neutro Abs: 2.6 10*3/uL (ref 1.4–7.7)
Neutrophils Relative %: 55.4 % (ref 43.0–77.0)
Platelets: 219 10*3/uL (ref 150.0–400.0)
RBC: 4.47 Mil/uL (ref 3.87–5.11)
RDW: 13.7 % (ref 11.5–15.5)
WBC: 4.7 10*3/uL (ref 4.0–10.5)

## 2021-01-14 LAB — COMPREHENSIVE METABOLIC PANEL
ALT: 8 U/L (ref 0–35)
AST: 12 U/L (ref 0–37)
Albumin: 4.3 g/dL (ref 3.5–5.2)
Alkaline Phosphatase: 72 U/L (ref 39–117)
BUN: 17 mg/dL (ref 6–23)
CO2: 34 mEq/L — ABNORMAL HIGH (ref 19–32)
Calcium: 10.6 mg/dL — ABNORMAL HIGH (ref 8.4–10.5)
Chloride: 103 mEq/L (ref 96–112)
Creatinine, Ser: 1.14 mg/dL (ref 0.40–1.20)
GFR: 45.59 mL/min — ABNORMAL LOW (ref >60.00)
Glucose, Bld: 90 mg/dL (ref 70–99)
Potassium: 3.8 mEq/L (ref 3.5–5.1)
Sodium: 143 mEq/L (ref 135–145)
Total Bilirubin: 0.5 mg/dL (ref 0.2–1.2)
Total Protein: 7.1 g/dL (ref 6.0–8.3)

## 2021-01-14 LAB — HIGH SENSITIVITY CRP: CRP, High Sensitivity: 1.43 mg/L (ref 0.000–5.000)

## 2021-01-14 LAB — TSH: TSH: 0.15 u[IU]/mL — ABNORMAL LOW (ref 0.35–4.50)

## 2021-01-14 MED ORDER — LEVOTHYROXINE SODIUM 75 MCG PO TABS
75.0000 ug | ORAL_TABLET | Freq: Every day | ORAL | 0 refills | Status: DC
Start: 2021-01-14 — End: 2021-06-04

## 2021-01-14 NOTE — Assessment & Plan Note (Signed)
Borderline hearing evaluation but given concern from daughter and patient will have audiology evaluate further and discuss treatment options.

## 2021-01-14 NOTE — Assessment & Plan Note (Signed)
BP slightly elevated. Home monitoring and update next week. Cont olmesartan 40 mg, spironolactone 25 mg.

## 2021-01-14 NOTE — Assessment & Plan Note (Addendum)
Persisting since stopping estrogen 1 year ago. Would like to restart. Discussed concern for alternative diagnosis given duration off estrogen and age. Will get lab work-up to evaluate thyroid/inflammation/liver/kidney. Discussed risk of restarting estrogen if blood work normal including MI/Stroke and pt would still want to pursue treatment if labs normal. Discussed need to control bp prior to restarting. If work-up normal consider Estradiol patch 0.025

## 2021-01-14 NOTE — Progress Notes (Signed)
Subjective:     Heather Cobb is a 80 y.o. female presenting for Hot Flashes (X 2-3 months regularly ) and Referral (For hearing test )     HPI   #Hot flashes - stopped hormones last may - worse over the last 3 months - daily - constantly feeling hot - stopped hormones in May 2021 and was having hot flashes  - was having some symptoms in the winter but it was tolerable - would like to go to [place]   Was on estradiol 0.025 patch Notes the 0.035 worked better   #Hearing test - no issues in the past - daughter is noticing she has had worsening hearing - having to repeat herself more often than usually - difficulty understanding   Review of Systems   Social History   Tobacco Use  Smoking Status Never Smoker  Smokeless Tobacco Never Used        Objective:    BP Readings from Last 3 Encounters:  01/14/21 (!) 150/80  10/30/20 130/80  06/22/20 140/86   Wt Readings from Last 3 Encounters:  01/14/21 146 lb 12 oz (66.6 kg)  10/30/20 145 lb (65.8 kg)  06/22/20 146 lb 12 oz (66.6 kg)    BP (!) 150/80   Pulse (!) 53   Temp 98.2 F (36.8 C) (Temporal)   Ht 5\' 4"  (1.626 m)   Wt 146 lb 12 oz (66.6 kg)   SpO2 98%   BMI 25.19 kg/m    Physical Exam Constitutional:      General: She is not in acute distress.    Appearance: She is well-developed. She is not diaphoretic.  HENT:     Right Ear: External ear normal.     Left Ear: External ear normal.     Nose: Nose normal.  Eyes:     Conjunctiva/sclera: Conjunctivae normal.  Cardiovascular:     Rate and Rhythm: Normal rate and regular rhythm.  Pulmonary:     Effort: Pulmonary effort is normal. No respiratory distress.     Breath sounds: Normal breath sounds. No wheezing.  Musculoskeletal:     Cervical back: Neck supple.  Skin:    General: Skin is warm and dry.     Capillary Refill: Capillary refill takes less than 2 seconds.  Neurological:     Mental Status: She is alert. Mental status is at baseline.   Psychiatric:        Mood and Affect: Mood normal.        Behavior: Behavior normal.     Hearing Screening   125Hz  250Hz  500Hz  1000Hz  2000Hz  3000Hz  4000Hz  6000Hz  8000Hz   Right ear:  20 20 20 20  20     Left ear:  20 25 25 20  20             Assessment & Plan:   Problem List Items Addressed This Visit      Cardiovascular and Mediastinum   Essential hypertension, benign    BP slightly elevated. Home monitoring and update next week. Cont olmesartan 40 mg, spironolactone 25 mg.       Hot flashes - Primary    Persisting since stopping estrogen 1 year ago. Would like to restart. Discussed concern for alternative diagnosis given duration off estrogen and age. Will get lab work-up to evaluate thyroid/inflammation/liver/kidney. Discussed risk of restarting estrogen if blood work normal including MI/Stroke and pt would still want to pursue treatment if labs normal. Discussed need to control bp prior to restarting. If work-up  normal consider Estradiol patch 0.025      Relevant Orders   Comprehensive metabolic panel   CBC with Differential   TSH   High sensitivity CRP     Nervous and Auditory   Decreased hearing of left ear    Borderline hearing evaluation but given concern from daughter and patient will have audiology evaluate further and discuss treatment options.       Relevant Orders   Ambulatory referral to Audiology       Return in about 6 weeks (around 02/25/2021).  Lesleigh Noe, MD  This visit occurred during the SARS-CoV-2 public health emergency.  Safety protocols were in place, including screening questions prior to the visit, additional usage of staff PPE, and extensive cleaning of exam room while observing appropriate contact time as indicated for disinfecting solutions.

## 2021-01-14 NOTE — Patient Instructions (Signed)
Blood work today  If normal and blood pressure improves on home monitoring can restart hormones

## 2021-01-20 ENCOUNTER — Other Ambulatory Visit: Payer: Self-pay | Admitting: Family Medicine

## 2021-01-20 DIAGNOSIS — R053 Chronic cough: Secondary | ICD-10-CM

## 2021-01-20 DIAGNOSIS — J984 Other disorders of lung: Secondary | ICD-10-CM

## 2021-01-22 DIAGNOSIS — H524 Presbyopia: Secondary | ICD-10-CM | POA: Diagnosis not present

## 2021-02-11 DIAGNOSIS — H903 Sensorineural hearing loss, bilateral: Secondary | ICD-10-CM | POA: Diagnosis not present

## 2021-03-01 ENCOUNTER — Other Ambulatory Visit: Payer: Self-pay | Admitting: Obstetrics & Gynecology

## 2021-03-02 ENCOUNTER — Ambulatory Visit (INDEPENDENT_AMBULATORY_CARE_PROVIDER_SITE_OTHER): Payer: Medicare HMO | Admitting: Family Medicine

## 2021-03-02 ENCOUNTER — Other Ambulatory Visit: Payer: Self-pay | Admitting: Family Medicine

## 2021-03-02 ENCOUNTER — Other Ambulatory Visit: Payer: Self-pay

## 2021-03-02 VITALS — BP 120/64 | HR 53 | Temp 98.1°F | Ht 64.0 in | Wt 148.0 lb

## 2021-03-02 DIAGNOSIS — E039 Hypothyroidism, unspecified: Secondary | ICD-10-CM | POA: Diagnosis not present

## 2021-03-02 DIAGNOSIS — N1831 Chronic kidney disease, stage 3a: Secondary | ICD-10-CM

## 2021-03-02 DIAGNOSIS — R232 Flushing: Secondary | ICD-10-CM

## 2021-03-02 DIAGNOSIS — I10 Essential (primary) hypertension: Secondary | ICD-10-CM | POA: Diagnosis not present

## 2021-03-02 LAB — BASIC METABOLIC PANEL
BUN: 15 mg/dL (ref 6–23)
CO2: 32 mEq/L (ref 19–32)
Calcium: 10 mg/dL (ref 8.4–10.5)
Chloride: 103 mEq/L (ref 96–112)
Creatinine, Ser: 1.3 mg/dL — ABNORMAL HIGH (ref 0.40–1.20)
GFR: 38.9 mL/min — ABNORMAL LOW (ref >60.00)
Glucose, Bld: 94 mg/dL (ref 70–99)
Potassium: 3.7 mEq/L (ref 3.5–5.1)
Sodium: 142 mEq/L (ref 135–145)

## 2021-03-02 LAB — TSH: TSH: 9.85 u[IU]/mL — ABNORMAL HIGH (ref 0.35–4.50)

## 2021-03-02 NOTE — Assessment & Plan Note (Signed)
Confusion over medication. Has been taking levothyroxine 75 mcg every other day. Will recheck TSH and adjust dose as needed.

## 2021-03-02 NOTE — Assessment & Plan Note (Signed)
Discussed risks of estrogen. Little improvement with change in thyroid medication. She will tolerate hot flashes to avoid increased heart/stroke risk.

## 2021-03-02 NOTE — Progress Notes (Signed)
Subjective:     Heather Cobb is a 80 y.o. female presenting for Follow-up (Hot flashes/BP )     HPI  #Hypothyroidism - taking 75 mcg every other day - didn't realize that she got a new lower dose  #Hot flashes  - no improvement in these   #CKD - drinking more water  - taking medication   Review of Systems  01/14/2021: Clinic - Hot flashes - thyroid over produced. Decrease thyroid medication. BP - elevated - home monitoring.   Social History   Tobacco Use  Smoking Status Never  Smokeless Tobacco Never        Objective:    BP Readings from Last 3 Encounters:  03/02/21 120/64  01/14/21 (!) 150/80  10/30/20 130/80   Wt Readings from Last 3 Encounters:  03/02/21 148 lb (67.1 kg)  01/14/21 146 lb 12 oz (66.6 kg)  10/30/20 145 lb (65.8 kg)    BP 120/64   Pulse (!) 53   Temp 98.1 F (36.7 C) (Temporal)   Ht 5\' 4"  (1.626 m)   Wt 148 lb (67.1 kg)   SpO2 96%   BMI 25.40 kg/m    Physical Exam Constitutional:      General: She is not in acute distress.    Appearance: She is well-developed. She is not diaphoretic.  HENT:     Right Ear: External ear normal.     Left Ear: External ear normal.  Eyes:     Conjunctiva/sclera: Conjunctivae normal.  Neck:     Thyroid: No thyroid mass, thyromegaly or thyroid tenderness.  Cardiovascular:     Rate and Rhythm: Normal rate and regular rhythm.     Heart sounds: Murmur heard.  Pulmonary:     Effort: Pulmonary effort is normal. No respiratory distress.     Breath sounds: Normal breath sounds. No wheezing.  Musculoskeletal:     Cervical back: Neck supple.  Skin:    General: Skin is warm and dry.     Capillary Refill: Capillary refill takes less than 2 seconds.  Neurological:     Mental Status: She is alert. Mental status is at baseline.  Psychiatric:        Mood and Affect: Mood normal.        Behavior: Behavior normal.          Assessment & Plan:   Problem List Items Addressed This Visit        Cardiovascular and Mediastinum   Essential hypertension, benign    bp controlled. Cont olmesartan 40 mg and spironolactone 25 mg       Relevant Orders   Basic metabolic panel   Hot flashes    Discussed risks of estrogen. Little improvement with change in thyroid medication. She will tolerate hot flashes to avoid increased heart/stroke risk.        Relevant Orders   TSH     Endocrine   Hypothyroidism    Confusion over medication. Has been taking levothyroxine 75 mcg every other day. Will recheck TSH and adjust dose as needed.        Relevant Orders   TSH     Genitourinary   Chronic kidney disease, stage 3a ([place]) - Primary    BP improved. Drinking more water, rare NSAID. Discussed if still low or lower would consider nephrology consult to get suggestions. Already on ARB.        Relevant Orders   Basic metabolic panel     Return if symptoms worsen  or fail to improve.  Lesleigh Noe, MD  This visit occurred during the SARS-CoV-2 public health emergency.  Safety protocols were in place, including screening questions prior to the visit, additional usage of staff PPE, and extensive cleaning of exam room while observing appropriate contact time as indicated for disinfecting solutions.

## 2021-03-02 NOTE — Patient Instructions (Signed)
Will check labs today  Mychart with results  Kidney's - avoid NSAIDS - drink lots of water

## 2021-03-02 NOTE — Assessment & Plan Note (Signed)
BP improved. Drinking more water, rare NSAID. Discussed if still low or lower would consider nephrology consult to get suggestions. Already on ARB.

## 2021-03-02 NOTE — Assessment & Plan Note (Signed)
bp controlled. Cont olmesartan 40 mg and spironolactone 25 mg

## 2021-03-04 ENCOUNTER — Other Ambulatory Visit: Payer: Self-pay | Admitting: Nephrology

## 2021-03-04 DIAGNOSIS — N1832 Chronic kidney disease, stage 3b: Secondary | ICD-10-CM

## 2021-03-04 DIAGNOSIS — R8290 Unspecified abnormal findings in urine: Secondary | ICD-10-CM | POA: Diagnosis not present

## 2021-03-04 DIAGNOSIS — N1831 Chronic kidney disease, stage 3a: Secondary | ICD-10-CM | POA: Diagnosis not present

## 2021-03-04 DIAGNOSIS — E538 Deficiency of other specified B group vitamins: Secondary | ICD-10-CM | POA: Diagnosis not present

## 2021-03-04 DIAGNOSIS — D649 Anemia, unspecified: Secondary | ICD-10-CM | POA: Diagnosis not present

## 2021-03-04 DIAGNOSIS — I10 Essential (primary) hypertension: Secondary | ICD-10-CM | POA: Diagnosis not present

## 2021-03-04 DIAGNOSIS — E039 Hypothyroidism, unspecified: Secondary | ICD-10-CM | POA: Diagnosis not present

## 2021-03-23 ENCOUNTER — Other Ambulatory Visit: Payer: Self-pay | Admitting: Family Medicine

## 2021-03-23 ENCOUNTER — Other Ambulatory Visit: Payer: Self-pay | Admitting: Internal Medicine

## 2021-03-23 DIAGNOSIS — J984 Other disorders of lung: Secondary | ICD-10-CM

## 2021-03-23 DIAGNOSIS — R053 Chronic cough: Secondary | ICD-10-CM

## 2021-03-24 ENCOUNTER — Telehealth: Payer: Self-pay | Admitting: Internal Medicine

## 2021-03-24 MED ORDER — PANTOPRAZOLE SODIUM 40 MG PO TBEC: 40.0000 mg | DELAYED_RELEASE_TABLET | Freq: Every day | ORAL | 0 refills | Status: DC

## 2021-03-24 NOTE — Telephone Encounter (Signed)
Spoke with pt and verified both upcoming appointment and pharmacy in which she needed refill sent into. Pt confirmed correct information. Refill sent in. Pt stated understanding. Nothing further needed at this time.

## 2021-03-26 ENCOUNTER — Other Ambulatory Visit: Payer: Self-pay | Admitting: Internal Medicine

## 2021-03-26 MED ORDER — PANTOPRAZOLE SODIUM 40 MG PO TBEC: 40.0000 mg | DELAYED_RELEASE_TABLET | Freq: Every day | ORAL | 0 refills | Status: DC

## 2021-03-26 NOTE — Addendum Note (Signed)
Addended by: Gavin Potters R on: 03/26/2021 10:33 AM   Modules accepted: Orders

## 2021-03-29 ENCOUNTER — Other Ambulatory Visit (HOSPITAL_COMMUNITY): Payer: Self-pay | Admitting: Surgery

## 2021-03-29 DIAGNOSIS — D446 Neoplasm of uncertain behavior of carotid body: Secondary | ICD-10-CM

## 2021-03-29 DIAGNOSIS — D447 Neoplasm of uncertain behavior of aortic body and other paraganglia: Secondary | ICD-10-CM

## 2021-03-31 ENCOUNTER — Inpatient Hospital Stay (HOSPITAL_COMMUNITY): Payer: Medicare HMO | Attending: Hematology

## 2021-03-31 ENCOUNTER — Ambulatory Visit (HOSPITAL_COMMUNITY)
Admission: RE | Admit: 2021-03-31 | Discharge: 2021-03-31 | Disposition: A | Discharge status: Home / Self Care | Payer: Medicare HMO | Source: Ambulatory Visit | Attending: Nephrology | Admitting: Nephrology

## 2021-03-31 ENCOUNTER — Encounter (HOSPITAL_COMMUNITY): Payer: Self-pay | Admitting: Radiology

## 2021-03-31 ENCOUNTER — Other Ambulatory Visit: Payer: Self-pay

## 2021-03-31 ENCOUNTER — Ambulatory Visit (HOSPITAL_COMMUNITY)
Admission: RE | Admit: 2021-03-31 | Discharge: 2021-03-31 | Disposition: A | Discharge status: Home / Self Care | Payer: Medicare HMO | Source: Ambulatory Visit | Attending: Hematology | Admitting: Hematology

## 2021-03-31 DIAGNOSIS — D487 Neoplasm of uncertain behavior of other specified sites: Secondary | ICD-10-CM | POA: Insufficient documentation

## 2021-03-31 DIAGNOSIS — Z800 Family history of malignant neoplasm of digestive organs: Secondary | ICD-10-CM | POA: Diagnosis not present

## 2021-03-31 DIAGNOSIS — D447 Neoplasm of uncertain behavior of aortic body and other paraganglia: Secondary | ICD-10-CM

## 2021-03-31 DIAGNOSIS — R8290 Unspecified abnormal findings in urine: Secondary | ICD-10-CM | POA: Insufficient documentation

## 2021-03-31 DIAGNOSIS — N281 Cyst of kidney, acquired: Secondary | ICD-10-CM | POA: Diagnosis not present

## 2021-03-31 DIAGNOSIS — I129 Hypertensive chronic kidney disease with stage 1 through stage 4 chronic kidney disease, or unspecified chronic kidney disease: Secondary | ICD-10-CM | POA: Diagnosis not present

## 2021-03-31 DIAGNOSIS — D355 Benign neoplasm of carotid body: Secondary | ICD-10-CM | POA: Diagnosis not present

## 2021-03-31 DIAGNOSIS — K110 Atrophy of salivary gland: Secondary | ICD-10-CM | POA: Diagnosis not present

## 2021-03-31 DIAGNOSIS — N1832 Chronic kidney disease, stage 3b: Secondary | ICD-10-CM | POA: Insufficient documentation

## 2021-03-31 DIAGNOSIS — N189 Chronic kidney disease, unspecified: Secondary | ICD-10-CM | POA: Diagnosis not present

## 2021-03-31 DIAGNOSIS — D446 Neoplasm of uncertain behavior of carotid body: Secondary | ICD-10-CM

## 2021-03-31 DIAGNOSIS — N390 Urinary tract infection, site not specified: Secondary | ICD-10-CM | POA: Insufficient documentation

## 2021-03-31 LAB — CBC WITH DIFFERENTIAL/PLATELET
Abs Immature Granulocytes: 0.02 10*3/uL (ref 0.00–0.07)
Basophils Absolute: 0 10*3/uL (ref 0.0–0.1)
Basophils Relative: 0 %
Eosinophils Absolute: 0.2 10*3/uL (ref 0.0–0.5)
Eosinophils Relative: 3 %
HCT: 40.8 % (ref 36.0–46.0)
Hemoglobin: 12.9 g/dL (ref 12.0–15.0)
Immature Granulocytes: 0 %
Lymphocytes Relative: 38 %
Lymphs Abs: 2 10*3/uL (ref 0.7–4.0)
MCH: 29.4 pg (ref 26.0–34.0)
MCHC: 31.6 g/dL (ref 30.0–36.0)
MCV: 92.9 fL (ref 80.0–100.0)
Monocytes Absolute: 0.5 10*3/uL (ref 0.1–1.0)
Monocytes Relative: 10 %
Neutro Abs: 2.5 10*3/uL (ref 1.7–7.7)
Neutrophils Relative %: 49 %
Platelets: 222 10*3/uL (ref 150–400)
RBC: 4.39 MIL/uL (ref 3.87–5.11)
RDW: 13.9 % (ref 11.5–15.5)
WBC: 5.2 10*3/uL (ref 4.0–10.5)
nRBC: 0 % (ref 0.0–0.2)

## 2021-03-31 LAB — COMPREHENSIVE METABOLIC PANEL
ALT: 13 U/L (ref 0–44)
AST: 16 U/L (ref 15–41)
Albumin: 4.1 g/dL (ref 3.5–5.0)
Alkaline Phosphatase: 66 U/L (ref 38–126)
Anion gap: 10 (ref 5–15)
BUN: 16 mg/dL (ref 8–23)
CO2: 30 mmol/L (ref 22–32)
Calcium: 10.1 mg/dL (ref 8.9–10.3)
Chloride: 102 mmol/L (ref 98–111)
Creatinine, Ser: 1.22 mg/dL — ABNORMAL HIGH (ref 0.44–1.00)
GFR, Estimated: 45 mL/min — ABNORMAL LOW (ref >60)
Glucose, Bld: 113 mg/dL — ABNORMAL HIGH (ref 70–99)
Potassium: 3.7 mmol/L (ref 3.5–5.1)
Sodium: 142 mmol/L (ref 135–145)
Total Bilirubin: 0.9 mg/dL (ref 0.3–1.2)
Total Protein: 7.6 g/dL (ref 6.5–8.1)

## 2021-03-31 MED ORDER — IOHEXOL 300 MG/ML  SOLN
75.0000 mL | Freq: Once | INTRAMUSCULAR | Status: AC | PRN
  Administered 2021-03-31: 75 mL via INTRAVENOUS

## 2021-04-07 ENCOUNTER — Inpatient Hospital Stay (HOSPITAL_COMMUNITY): Payer: Medicare HMO

## 2021-04-07 ENCOUNTER — Encounter (HOSPITAL_COMMUNITY): Payer: Self-pay | Admitting: Hematology and Oncology

## 2021-04-07 ENCOUNTER — Inpatient Hospital Stay (HOSPITAL_BASED_OUTPATIENT_CLINIC_OR_DEPARTMENT_OTHER): Payer: Medicare HMO | Admitting: Hematology and Oncology

## 2021-04-07 ENCOUNTER — Other Ambulatory Visit: Payer: Self-pay

## 2021-04-07 DIAGNOSIS — D447 Neoplasm of uncertain behavior of aortic body and other paraganglia: Secondary | ICD-10-CM | POA: Diagnosis not present

## 2021-04-07 DIAGNOSIS — N3000 Acute cystitis without hematuria: Secondary | ICD-10-CM | POA: Diagnosis not present

## 2021-04-07 DIAGNOSIS — N390 Urinary tract infection, site not specified: Secondary | ICD-10-CM | POA: Diagnosis not present

## 2021-04-07 DIAGNOSIS — Z800 Family history of malignant neoplasm of digestive organs: Secondary | ICD-10-CM | POA: Diagnosis not present

## 2021-04-07 DIAGNOSIS — D487 Neoplasm of uncertain behavior of other specified sites: Secondary | ICD-10-CM | POA: Diagnosis not present

## 2021-04-07 LAB — URINALYSIS, COMPLETE (UACMP) WITH MICROSCOPIC
Bilirubin Urine: NEGATIVE
Glucose, UA: NEGATIVE mg/dL
Hgb urine dipstick: NEGATIVE
Ketones, ur: NEGATIVE mg/dL
Nitrite: POSITIVE — AB
Protein, ur: NEGATIVE mg/dL
Specific Gravity, Urine: 1.018 (ref 1.005–1.030)
pH: 5 (ref 5.0–8.0)

## 2021-04-07 MED ORDER — AMOXICILLIN-POT CLAVULANATE 875-125 MG PO TABS: 1.0000 | ORAL_TABLET | Freq: Two times a day (BID) | ORAL | 0 refills | Status: DC

## 2021-04-07 NOTE — Assessment & Plan Note (Signed)
I have reviewed her blood work, examined her and reviewed CT imaging She has no signs of cancer recurrence She will return again in 1 year for further follow-up

## 2021-04-07 NOTE — Progress Notes (Signed)
[facility] progress notes  Patient Care Team: Lesleigh Noe, MD as PCP - General (Family Medicine) Bary Castilla, Forest Gleason, MD (General Surgery) Melida Quitter, MD as Consulting Physician (Otolaryngology) Luanne Bras, MD as Consulting Physician (Interventional Radiology)  CHIEF COMPLAINTS/PURPOSE OF VISIT:  Paraganglioma, status post excision  HISTORY OF PRESENTING ILLNESS:  Heather Cobb 80 y.o. 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 She is being monitored closely She just completed CT imaging and blood work recently For the last 2 weeks, she has started to complain of passage of strong odorous urine She has some mild discomfort in her back She denies hematuria No recent fever or chills Denies nausea She has no recent neck pain  MEDICAL HISTORY:  Past Medical History:  Diagnosis Date   Anemia    Carotid body tumor ([place])    left   Family history of colon cancer    Hypertension    Hypothyroid    Pyelonephritis    Rectal bleeding 11/26/2015   Vertigo    Vitamin B 12 deficiency     SURGICAL HISTORY: Past Surgical History:  Procedure Laterality Date   ABDOMINAL HYSTERECTOMY     CATARACT EXTRACTION W/ INTRAOCULAR LENS  IMPLANT, BILATERAL     CHOLECYSTECTOMY N/A 04/15/2015   Procedure: LAPAROSCOPIC CHOLECYSTECTOMY;  Surgeon: Aviva Signs, MD;  Location: [facility];  Service: General;  Laterality: N/A;   COLONOSCOPY  2012   [place]   COLONOSCOPY WITH PROPOFOL N/A 02/03/2016   Procedure: COLONOSCOPY WITH PROPOFOL;  Surgeon: Robert Bellow, MD;  Location: [facility];  Service: Endoscopy;  Laterality: N/A;   EYE SURGERY Bilateral    cataract extractions   HEMORRHOID SURGERY N/A 08/31/2018   Procedure: HEMORRHOIDECTOMY;  Surgeon: Robert Bellow, MD;  Location: [facility];  Service: General;   Laterality: N/A;   IR ANGIO EXTERNAL CAROTID SEL EXT CAROTID UNI L MOD SED  10/26/2017   IR ANGIO INTRA EXTRACRAN SEL COM CAROTID INNOMINATE BILAT MOD SED  10/26/2017   IR ANGIO VERTEBRAL SEL VERTEBRAL UNI R MOD SED  10/26/2017   IR ANGIOGRAM EXTREMITY LEFT  10/26/2017   IR ANGIOGRAM FOLLOW UP STUDY  10/26/2017   IR NEURO EACH ADD'L AFTER BASIC UNI LEFT (MS)  10/26/2017   IR RADIOLOGIST EVAL & MGMT  09/21/2017   IR TRANSCATH/EMBOLIZ  10/26/2017   MASS EXCISION Left 10/27/2017   Procedure: EXCISION CAROTID BODY TUMOR;  Surgeon: Melida Quitter, MD;  Location: [facility];  Service: ENT;  Laterality: Left;   RADIOLOGY WITH ANESTHESIA N/A 10/26/2017   Procedure: EMBOLIZATION;  Surgeon: Luanne Bras, MD;  Location: [facility];  Service: Radiology;  Laterality: N/A;   SIGMOIDOSCOPY N/A 08/31/2018   Procedure: SIGMOIDOSCOPY-IN  OR;  Surgeon: Robert Bellow, MD;  Location: [facility];  Service: General;  Laterality: N/A;   UPPER GASTROINTESTINAL ENDOSCOPY  2014   VESICOVAGINAL FISTULA CLOSURE W/ TAH      SOCIAL HISTORY: Social History   Socioeconomic History   Marital status: Widowed    Spouse name: Not on file   Number of children: 1   Years of education: high school   Highest education level: Not on file  Occupational History   Occupation: Engineer, manufacturing systems    Comment: retired  Tobacco Use   Smoking status: Never   Smokeless tobacco: Never  Vaping Use   Vaping Use: Never  used  Substance and Sexual Activity   Alcohol use: No    Alcohol/week: 0.0 standard drinks   Drug use: No   Sexual activity: Not Currently    Birth control/protection: Surgical  Other Topics Concern   Not on file  Social History Narrative   10/15/19   From: the area   Living: with Daugher Heather Cobb) and son-in-law   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, 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      Family: lives with daughter, has 1 grandson and 3 great-grandchildren and 1 great-great-grandchild      Enjoys: eat, dance, travel, cooking       Exercise: walk - not as much in cold, will do 1 mile/day   Diet: good, tries to eat health      Safety   Seat belts: Yes    Guns: Yes  and secure   Safe in relationships: Yes    Social Determinants of Health   Financial Resource Strain: Not on file  Food Insecurity: Not on file  Transportation Needs: Not on file  Physical Activity: Not on file  Stress: Not on file  Social Connections: Not on file  Intimate Partner Violence: Not on file    FAMILY HISTORY: Family History  Problem Relation Age of Onset   Asthma Mother    Kidney failure Mother    Hypertension Daughter    Colon cancer Father        dx in his 46s   Stomach cancer Sister        stage 4   Colon cancer Maternal Uncle    Heart attack Maternal Grandmother    Asthma Brother    Cancer Neg Hx     ALLERGIES:  is allergic to codeine.  MEDICATIONS:  Current Outpatient Medications  Medication Sig Dispense Refill   amoxicillin-clavulanate (AUGMENTIN) 875-125 MG tablet Take 1 tablet by mouth 2 (two) times daily. 10 tablet 0   aspirin EC 81 MG tablet Take 81 mg by mouth daily.     cholecalciferol (VITAMIN D) 1000 UNITS tablet Take 1,000 Units by mouth daily.     clotrimazole-betamethasone (LOTRISONE) cream APPLY TOPICALLY TO THE AFFECTED AREA TWICE DAILY 30 g 3   ferrous sulfate 325 (65 FE) MG tablet Take 325 mg by mouth daily with breakfast.     hydrochlorothiazide (HYDRODIURIL) 12.5 MG tablet      levothyroxine (SYNTHROID) 75 MCG tablet Take 1 tablet (75 mcg total) by mouth daily before breakfast. (Patient taking differently: Take 75 mcg by mouth every other day.) 90 tablet 0   mometasone (NASONEX) 50 MCG/ACT nasal spray Place 2 sprays into the nose daily. (Patient taking differently: Place 2 sprays into the nose as needed.) 1 g 0   olmesartan (BENICAR) 40 MG tablet Take 1 tablet (40 mg total) by mouth daily. 90 tablet 3   pantoprazole (PROTONIX) 40 MG tablet Take 1 tablet (40 mg total) by mouth daily. 30 tablet 0    spironolactone (ALDACTONE) 25 MG tablet Take 1 tablet by mouth daily.     vitamin B-12 (CYANOCOBALAMIN) 1000 MCG tablet Take 1,000 mcg by mouth daily.      acetaminophen (TYLENOL) 325 MG tablet Take 650 mg by mouth every 6 (six) hours as needed. (Patient not taking: Reported on 04/07/2021)     albuterol (VENTOLIN HFA) 108 (90 Base) MCG/ACT inhaler Inhale 2 puffs into the lungs every 6 (six) hours as needed for wheezing or shortness of breath. (Patient not taking: Reported on 04/07/2021) 18  g 11   benzonatate (TESSALON) 100 MG capsule TAKE ONE CAPSULE BY MOUTH TWICE A DAY AS NEEDED FOR COUGH (Patient not taking: Reported on 04/07/2021) 90 capsule 0   levalbuterol (XOPENEX HFA) 45 MCG/ACT inhaler Inhale 1-2 puffs into the lungs every 4 (four) hours as needed for wheezing. (Patient not taking: Reported on 04/07/2021) 1 Inhaler 0   linaclotide (LINZESS) 145 MCG CAPS capsule Take 1 capsule (145 mcg total) by mouth daily as needed (for constipation). (Patient not taking: Reported on 04/07/2021) 90 capsule 3   No current facility-administered medications for this visit.    REVIEW OF SYSTEMS:   Constitutional: Denies fevers, chills or abnormal night sweats Eyes: Denies blurriness of vision, double vision or watery eyes Ears, nose, mouth, throat, and face: Denies mucositis or sore throat Respiratory: Denies cough, dyspnea or wheezes Cardiovascular: Denies palpitation, chest discomfort or lower extremity swelling Gastrointestinal:  Denies nausea, heartburn or change in bowel habits Skin: Denies abnormal skin rashes Lymphatics: Denies new lymphadenopathy or easy bruising Neurological:Denies numbness, tingling or new weaknesses Behavioral/Psych: Mood is stable, no new changes  All other systems were reviewed with the patient and are negative.  PHYSICAL EXAMINATION: ECOG PERFORMANCE STATUS: 1 - Symptomatic but completely ambulatory  Vitals:   04/07/21 0806  BP: (!) 148/70  Pulse: (!) 55  Resp: 16   Temp: (!) 97.5 F (36.4 C)  SpO2: 100%   Filed Weights   04/07/21 0806  Weight: 146 lb 12.8 oz (66.6 kg)    GENERAL:alert, no distress and comfortable SKIN: skin color, texture, turgor are normal, no rashes or significant lesions EYES: normal, conjunctiva are pink and non-injected, sclera clear OROPHARYNX:no exudate, normal lips, buccal mucosa, and tongue  NECK: supple, thyroid normal size, non-tender, without nodularity LYMPH:  no palpable lymphadenopathy in the cervical, axillary or inguinal LUNGS: clear to auscultation and percussion with normal breathing effort HEART: regular rate & rhythm and no murmurs without lower extremity edema ABDOMEN:abdomen soft, non-tender and normal bowel sounds Musculoskeletal:no cyanosis of digits and no clubbing  PSYCH: alert & oriented x 3 with fluent speech NEURO: no focal motor/sensory deficits  LABORATORY DATA:  I have reviewed the data as listed Lab Results  Component Value Date   WBC 5.2 03/31/2021   HGB 12.9 03/31/2021   HCT 40.8 03/31/2021   MCV 92.9 03/31/2021   PLT 222 03/31/2021   Recent Labs    01/14/21 1004 03/02/21 1127 03/31/21 0855  NA 143 142 142  K 3.8 3.7 3.7  CL 103 103 102  CO2 34* 32 30  GLUCOSE 90 94 113*  BUN 17 15 16   CREATININE 1.14 1.30* 1.22*  CALCIUM 10.6* 10.0 10.1  GFRNONAA  --   --  45*  PROT 7.1  --  7.6  ALBUMIN 4.3  --  4.1  AST 12  --  16  ALT 8  --  13  ALKPHOS 72  --  66  BILITOT 0.5  --  0.9    RADIOGRAPHIC STUDIES: I have personally reviewed the radiological images as listed and agreed with the findings in the report. CT SOFT TISSUE NECK W CONTRAST  Result Date: 04/01/2021 CLINICAL DATA:  Follow-up left carotid paraganglioma treated with embolization and surgery in February of 2019. EXAM: CT NECK WITH CONTRAST TECHNIQUE: Multidetector CT imaging of the neck was performed using the standard protocol following the bolus administration of intravenous contrast. CONTRAST:  46mL OMNIPAQUE  IOHEXOL 300 MG/ML  SOLN COMPARISON:  03/27/2020 and multiple previous, as distant  as the diagnostic study of 08/15/2017. FINDINGS: Pharynx and larynx: No mucosal or submucosal lesion. Salivary glands: Parotid and submandibular glands are normal, other than chronic atrophic change of the right submandibular gland. Thyroid: Mild asymmetric prominence of the left lobe compared to the right. No focal mass. Lymph nodes: No enlarged or abnormal density lymph nodes on either side of the neck. Vascular: Atherosclerotic change at both carotid bifurcations but without visible stenosis. No evidence of residual or recurrent carotid body tumor on the left. Limited intracranial: Normal Visualized orbits: Normal Mastoids and visualized paranasal sinuses: Clear Skeleton: Ordinary cervical spondylosis as seen previously. Upper chest: Normal Other: None IMPRESSION: No change. No evidence of residual or recurrent carotid body tumor on the left. Ordinary mild atherosclerotic change at the carotid bifurcations without apparent stenosis on this standard neck study. Submandibular atrophy on the right, a chronic finding. Electronically Signed   By: Nelson Chimes M.D.   On: 04/01/2021 11:35   US RENAL  Result Date: 04/02/2021 CLINICAL DATA:  Chronic kidney disease. EXAM: RENAL / URINARY TRACT ULTRASOUND COMPLETE COMPARISON:  None. FINDINGS: Right Kidney: Renal measurements: 9.4 x 5.0 x 4.0 cm = volume: 99.8 mL. Echogenicity within normal limits. No mass or hydronephrosis visualized. Anechoic cyst within the upper pole renal sinus is identified measuring 1.7 x 2.3 x 1.8 cm. Left Kidney: Renal measurements: 10.1 by 4.4 x 5.6 cm = volume: 129.7 mL. Echogenicity within normal limits. No mass or hydronephrosis visualized. Bladder: Appears normal for degree of bladder distention. Ureteral jets not visualized. Other: None. IMPRESSION: 1. No signs of obstructive uropathy. 2. Mild asymmetric right renal atrophy. 3. Upper pole right kidney renal  sinus cysts. Electronically Signed   By: Kerby Moors M.D.   On: 04/02/2021 10:44    ASSESSMENT & PLAN:  Paraganglioma ([place]) I have reviewed her blood work, examined her and reviewed CT imaging She has no signs of cancer recurrence She will return again in 1 year for further follow-up  UTI (urinary tract infection) She has history of multidrug-resistant UTI with E. coli She is symptomatic I will order urinalysis and urine culture I will start her on Augmentin based on sensitivity from last year We will call her with test results  Orders Placed This Encounter  Procedures   Urine Culture    Standing Status:   Future    Standing Expiration Date:   04/07/2022   CT Soft Tissue Neck W Contrast    Standing Status:   Future    Standing Expiration Date:   04/07/2022    Order Specific Question:   If indicated for the ordered procedure, I authorize the administration of contrast media per Radiology protocol    Answer:   Yes    Order Specific Question:   Preferred imaging location?    Answer:   [facility]   Urinalysis, Complete w Microscopic    Standing Status:   Future    Standing Expiration Date:   04/07/2022    All questions were answered. The patient knows to call the clinic with any problems, questions or concerns. The total time spent in the appointment was 20 minutes encounter with patients including review of chart and various tests results, discussions about plan of care and coordination of care plan   Heath Lark, MD 04/07/2021 8:43 AM

## 2021-04-07 NOTE — Assessment & Plan Note (Signed)
She has history of multidrug-resistant UTI with E. coli She is symptomatic I will order urinalysis and urine culture I will start her on Augmentin based on sensitivity from last year We will call her with test results

## 2021-04-08 DIAGNOSIS — E039 Hypothyroidism, unspecified: Secondary | ICD-10-CM | POA: Diagnosis not present

## 2021-04-08 DIAGNOSIS — D649 Anemia, unspecified: Secondary | ICD-10-CM | POA: Diagnosis not present

## 2021-04-08 DIAGNOSIS — N1832 Chronic kidney disease, stage 3b: Secondary | ICD-10-CM | POA: Diagnosis not present

## 2021-04-08 DIAGNOSIS — R8290 Unspecified abnormal findings in urine: Secondary | ICD-10-CM | POA: Diagnosis not present

## 2021-04-08 DIAGNOSIS — E538 Deficiency of other specified B group vitamins: Secondary | ICD-10-CM | POA: Diagnosis not present

## 2021-04-08 DIAGNOSIS — I10 Essential (primary) hypertension: Secondary | ICD-10-CM | POA: Diagnosis not present

## 2021-04-08 DIAGNOSIS — N1831 Chronic kidney disease, stage 3a: Secondary | ICD-10-CM | POA: Diagnosis not present

## 2021-04-09 ENCOUNTER — Telehealth: Payer: Self-pay

## 2021-04-09 NOTE — Telephone Encounter (Signed)
Called and given below message. She verbalized understanding and is feeling some better. Still having back pain at times and taking tylenol prn. Denies burning with urination. Urine still smells strong.  Instructed to increase fluid intake and call the office back if needed. She verbalized understanding.

## 2021-04-09 NOTE — Telephone Encounter (Signed)
-----   Message from Heath Lark, MD sent at 04/09/2021 11:37 AM EDT ----- I saw her at [facility], did urine culture For some reason still pending Can you call and ask if she is feeling better?

## 2021-04-12 ENCOUNTER — Other Ambulatory Visit (HOSPITAL_COMMUNITY): Payer: Self-pay

## 2021-04-12 ENCOUNTER — Telehealth: Payer: Self-pay

## 2021-04-12 ENCOUNTER — Other Ambulatory Visit: Payer: Self-pay

## 2021-04-12 ENCOUNTER — Ambulatory Visit (INDEPENDENT_AMBULATORY_CARE_PROVIDER_SITE_OTHER): Payer: Medicare HMO | Admitting: Acute Care

## 2021-04-12 ENCOUNTER — Encounter: Payer: Self-pay | Admitting: Acute Care

## 2021-04-12 VITALS — BP 120/70 | HR 56 | Temp 97.8°F | Ht 64.5 in | Wt 148.8 lb

## 2021-04-12 DIAGNOSIS — J45909 Unspecified asthma, uncomplicated: Secondary | ICD-10-CM

## 2021-04-12 DIAGNOSIS — R053 Chronic cough: Secondary | ICD-10-CM

## 2021-04-12 DIAGNOSIS — D446 Neoplasm of uncertain behavior of carotid body: Secondary | ICD-10-CM

## 2021-04-12 DIAGNOSIS — D447 Neoplasm of uncertain behavior of aortic body and other paraganglia: Secondary | ICD-10-CM

## 2021-04-12 DIAGNOSIS — N3000 Acute cystitis without hematuria: Secondary | ICD-10-CM

## 2021-04-12 NOTE — Progress Notes (Signed)
History of Present Illness Heather Cobb is a 80 y.o. female with cough and shortness of breath, mild asthma symptoms. She is followed by Dr. Shearon Cobb.   04/12/2021 Pt. Presents for follow up. She was seen by Dr. Shearon Cobb last on 02/09/2021. At that time she was encouraged to cut back on spicy greasy foods.Spirometry was normal. She was encouraged to take her Protonix daily without fail.  She states she has been doing well. Her daughter states she only coughs when she is eating too fast , and when she eats spicy food. ( Which she is trying to eat less). She is taking  her Protonix daily. She states she does usually follow the GERD diet. We discussed that the menthol gum she likes to chew is not good for her GERD. She states she has not had any shortness of breath, and has not had to use her rescue inhaler. She is sleeping on 2 pillows at bedtime. She denies fever, chest pain, orthopnea of hemoptysis.    Test Results: Chest xray and CT Chest from Jan/Feb 2021. CT chest without any nodules, masses effusions. Mild left lower lobe ground glass which is clinically indeterminate. The nodular density at the left lung base on the recent chest x-ray corresponds to mild pulmonary scarring.     CBC Latest Ref Rng & Units 03/31/2021 01/14/2021 03/27/2020  WBC 4.0 - 10.5 K/uL 5.2 4.7 5.1  Hemoglobin 12.0 - 15.0 g/dL 12.9 12.9 12.3  Hematocrit 36.0 - 46.0 % 40.8 38.7 38.6  Platelets 150 - 400 K/uL 222 219.0 218    BMP Latest Ref Rng & Units 03/31/2021 03/02/2021 01/14/2021  Glucose 70 - 99 mg/dL 113(H) 94 90  BUN 8 - 23 mg/dL '16 15 17  '$ Creatinine 0.44 - 1.00 mg/dL 1.22(H) 1.30(H) 1.14  Sodium 135 - 145 mmol/L 142 142 143  Potassium 3.5 - 5.1 mmol/L 3.7 3.7 3.8  Chloride 98 - 111 mmol/L 102 103 103  CO2 22 - 32 mmol/L 30 32 34(H)  Calcium 8.9 - 10.3 mg/dL 10.1 10.0 10.6(H)    BNP No results found for: BNP  ProBNP No results found for: PROBNP  PFT No results found for: FEV1PRE, FEV1POST, FVCPRE,  FVCPOST, TLC, DLCOUNC, PREFEV1FVCRT, PSTFEV1FVCRT  CT SOFT TISSUE NECK W CONTRAST  Result Date: 04/01/2021 CLINICAL DATA:  Follow-up left carotid paraganglioma treated with embolization and surgery in February of 2019. EXAM: CT NECK WITH CONTRAST TECHNIQUE: Multidetector CT imaging of the neck was performed using the standard protocol following the bolus administration of intravenous contrast. CONTRAST:  24m OMNIPAQUE IOHEXOL 300 MG/ML  SOLN COMPARISON:  03/27/2020 and multiple previous, as distant as the diagnostic study of 08/15/2017. FINDINGS: Pharynx and larynx: No mucosal or submucosal lesion. Salivary glands: Parotid and submandibular glands are normal, other than chronic atrophic change of the right submandibular gland. Thyroid: Mild asymmetric prominence of the left lobe compared to the right. No focal mass. Lymph nodes: No enlarged or abnormal density lymph nodes on either side of the neck. Vascular: Atherosclerotic change at both carotid bifurcations but without visible stenosis. No evidence of residual or recurrent carotid body tumor on the left. Limited intracranial: Normal Visualized orbits: Normal Mastoids and visualized paranasal sinuses: Clear Skeleton: Ordinary cervical spondylosis as seen previously. Upper chest: Normal Other: None IMPRESSION: No change. No evidence of residual or recurrent carotid body tumor on the left. Ordinary mild atherosclerotic change at the carotid bifurcations without apparent stenosis on this standard neck study. Submandibular atrophy on the right, a  chronic finding. Electronically Signed   By: Heather Cobb M.D.   On: 04/01/2021 11:35   US RENAL  Result Date: 04/02/2021 CLINICAL DATA:  Chronic kidney disease. EXAM: RENAL / URINARY TRACT ULTRASOUND COMPLETE COMPARISON:  None. FINDINGS: Right Kidney: Renal measurements: 9.4 x 5.0 x 4.0 cm = volume: 99.8 mL. Echogenicity within normal limits. No mass or hydronephrosis visualized. Anechoic cyst within the upper pole  renal sinus is identified measuring 1.7 x 2.3 x 1.8 cm. Left Kidney: Renal measurements: 10.1 by 4.4 x 5.6 cm = volume: 129.7 mL. Echogenicity within normal limits. No mass or hydronephrosis visualized. Bladder: Appears normal for degree of bladder distention. Ureteral jets not visualized. Other: None. IMPRESSION: 1. No signs of obstructive uropathy. 2. Mild asymmetric right renal atrophy. 3. Upper pole right kidney renal sinus cysts. Electronically Signed   By: Heather Cobb M.D.   On: 04/02/2021 10:44     Past medical hx Past Medical History:  Diagnosis Date   Anemia    Carotid body tumor (Heather Cobb)    left   Family history of colon cancer    Hypertension    Hypothyroid    Pyelonephritis    Rectal bleeding 11/26/2015   Vertigo    Vitamin B 12 deficiency      Social History   Tobacco Use   Smoking status: Never   Smokeless tobacco: Never  Vaping Use   Vaping Use: Never used  Substance Use Topics   Alcohol use: No    Alcohol/week: 0.0 standard drinks   Drug use: No    Heather Cobb reports that she has never smoked. She has never used smokeless tobacco. She reports that she does not drink alcohol and does not use drugs.  Tobacco Cessation: Never smoker   Past surgical hx, Family hx, Social hx all reviewed.  Current Outpatient Medications on File Prior to Visit  Medication Sig   acetaminophen (TYLENOL) 325 MG tablet Take 650 mg by mouth every 6 (six) hours as needed.   albuterol (VENTOLIN HFA) 108 (90 Base) MCG/ACT inhaler Inhale 2 puffs into the lungs every 6 (six) hours as needed for wheezing or shortness of breath.   amoxicillin-clavulanate (AUGMENTIN) 875-125 MG tablet Take 1 tablet by mouth 2 (two) times daily.   aspirin EC 81 MG tablet Take 81 mg by mouth daily.   benzonatate (TESSALON) 100 MG capsule TAKE ONE CAPSULE BY MOUTH TWICE A DAY AS NEEDED FOR COUGH   cholecalciferol (VITAMIN D) 1000 UNITS tablet Take 1,000 Units by mouth daily.   clotrimazole-betamethasone  (LOTRISONE) cream APPLY TOPICALLY TO THE AFFECTED AREA TWICE DAILY   ferrous sulfate 325 (65 FE) MG tablet Take 325 mg by mouth daily with breakfast.   hydrochlorothiazide (HYDRODIURIL) 12.5 MG tablet    levalbuterol (XOPENEX HFA) 45 MCG/ACT inhaler Inhale 1-2 puffs into the lungs every 4 (four) hours as needed for wheezing.   levothyroxine (SYNTHROID) 75 MCG tablet Take 1 tablet (75 mcg total) by mouth daily before breakfast. (Patient taking differently: Take 75 mcg by mouth every other day.)   linaclotide (LINZESS) 145 MCG CAPS capsule Take 1 capsule (145 mcg total) by mouth daily as needed (for constipation).   mometasone (NASONEX) 50 MCG/ACT nasal spray Place 2 sprays into the nose daily. (Patient taking differently: Place 2 sprays into the nose as needed.)   olmesartan (BENICAR) 40 MG tablet Take 1 tablet (40 mg total) by mouth daily.   pantoprazole (PROTONIX) 40 MG tablet Take 1 tablet (40 mg total) by mouth  daily.   spironolactone (ALDACTONE) 25 MG tablet Take 1 tablet by mouth daily.   vitamin B-12 (CYANOCOBALAMIN) 1000 MCG tablet Take 1,000 mcg by mouth daily.    No current facility-administered medications on file prior to visit.     Allergies  Allergen Reactions   Codeine Nausea Only and Other (See Comments)    fever fever fever    Review Of Systems:  Constitutional:   No  weight loss, night sweats,  Fevers, chills, fatigue, or  lassitude.  HEENT:   No headaches,  Difficulty swallowing,  Tooth/dental problems, or  Sore throat,                No sneezing, itching, ear ache, nasal congestion, post nasal drip,   CV:  No chest pain,  Orthopnea, PND, swelling in lower extremities, anasarca, dizziness, palpitations, syncope.   GI  No heartburn, indigestion, abdominal pain, nausea, vomiting, diarrhea, change in bowel habits, loss of appetite, bloody stools.   Resp: No shortness of breath with exertion or at rest.  No excess mucus, no productive cough,  No non-productive cough,   No coughing up of blood.  No change in color of mucus.  No wheezing.  No chest wall deformity  Skin: no rash or lesions.  GU: no dysuria, change in color of urine, no urgency or frequency.  No flank pain, no hematuria   MS:  No joint pain or swelling.  No decreased range of motion.  No back pain.  Psych:  No change in mood or affect. No depression or anxiety.  No memory loss.   Vital Signs BP 120/70 (BP Location: Right Arm, Cuff Size: Normal)   Pulse (!) 56   Temp 97.8 F (36.6 C) (Oral)   Ht 5' 4.5" (1.638 m)   Wt 148 lb 12.8 oz (67.5 kg)   SpO2 98%   BMI 25.15 kg/m    Physical Exam:  General- No distress,  A&Ox3 ENT: No sinus tenderness, TM clear, pale nasal mucosa, no oral exudate,no post nasal drip, no LAN Cardiac: S1, S2, regular rate and rhythm, no murmur Chest: No wheeze/ rales/ dullness; no accessory muscle use, no nasal flaring, no sternal retractions Abd.: Soft Non-tender, ND, NT, BS +, Body mass index is 25.15 kg/m.  Ext: No clubbing cyanosis, edema Neuro:  normal strength, MAE x 4, A&O x 3 Skin: No rashes, warm and dry Psych: normal mood and behavior   Assessment/Plan  Cough Stable interval Plan We will send in a prescription for your Protonix through the [place]. ( 90 day supply)  Continue taking your Protonix as you have been doing.  Follow the GERD diet. I will give you a copy of the GERD diet.  Sleep with an extra pillow under your head at night.  Follow up with Dr. Shearon Cobb in 12 months or as needed    Dyspnea ( Mild asthma) Plan Continue using Albuterol as needed for shortness of breath or wheezing  Call for early signs of asthma flare Follow up with Dr. Shearon Cobb in 12 months or sooner as needed.   I spent 30 minutes dedicated to the care of this patient on the date of this encounter to include pre-visit review of records, face-to-face time with the patient discussing conditions above, post visit ordering of testing, clinical documentation with the  electronic health record, making appropriate referrals as documented, and communicating necessary information to the patient's healthcare team.    Magdalen Spatz, NP 04/12/2021  12:02 PM

## 2021-04-12 NOTE — Telephone Encounter (Signed)
-----   Message from Heath Lark, MD sent at 04/12/2021  7:52 AM EDT ----- Pls call her back If not better, I suggest PCP or urgent care to order repeat urine culture Please document

## 2021-04-12 NOTE — Telephone Encounter (Signed)
Called and given below message. She verbalized understanding. She finished up antibiotics last night. Still complaining of back pain and feels some better. She will go to PCP and have urine culture.

## 2021-04-12 NOTE — Patient Instructions (Addendum)
  It is great to meet you today.  We will send in a prescription for your Protonix through the [place]. ( 90 day supply)  Continue taking your Protonix as you have been doing.  Follow the GERD diet. I will give you a copy of the GERD diet.  Sleep with an extra pillow under your head at night.  Follow up with Dr. Shearon Stalls in 12 months or as needed  Please contact office for sooner follow up if symptoms do not improve or worsen or seek emergency care

## 2021-04-14 ENCOUNTER — Ambulatory Visit: Payer: Medicare HMO | Admitting: Family Medicine

## 2021-04-21 ENCOUNTER — Other Ambulatory Visit: Payer: Self-pay | Admitting: Internal Medicine

## 2021-05-28 ENCOUNTER — Telehealth: Payer: Self-pay | Admitting: Family Medicine

## 2021-06-01 NOTE — Telephone Encounter (Signed)
Patient is overdue for TSH re check please schedule lab only.

## 2021-06-02 ENCOUNTER — Other Ambulatory Visit: Payer: Self-pay

## 2021-06-02 ENCOUNTER — Other Ambulatory Visit: Payer: Medicare HMO

## 2021-06-02 ENCOUNTER — Other Ambulatory Visit (INDEPENDENT_AMBULATORY_CARE_PROVIDER_SITE_OTHER): Payer: Medicare HMO

## 2021-06-02 DIAGNOSIS — E039 Hypothyroidism, unspecified: Secondary | ICD-10-CM | POA: Diagnosis not present

## 2021-06-02 DIAGNOSIS — D447 Neoplasm of uncertain behavior of aortic body and other paraganglia: Secondary | ICD-10-CM

## 2021-06-02 DIAGNOSIS — R053 Chronic cough: Secondary | ICD-10-CM

## 2021-06-02 DIAGNOSIS — D446 Neoplasm of uncertain behavior of carotid body: Secondary | ICD-10-CM

## 2021-06-02 DIAGNOSIS — J984 Other disorders of lung: Secondary | ICD-10-CM

## 2021-06-02 MED ORDER — BENZONATATE 100 MG PO CAPS: ORAL_CAPSULE | ORAL | 1 refills | Status: DC

## 2021-06-02 NOTE — Telephone Encounter (Signed)
Called mrs. Haymond and got her scheduled for 9/15 at 41

## 2021-06-03 ENCOUNTER — Encounter: Payer: Self-pay | Admitting: Family Medicine

## 2021-06-03 ENCOUNTER — Other Ambulatory Visit: Payer: Medicare HMO

## 2021-06-03 DIAGNOSIS — E039 Hypothyroidism, unspecified: Secondary | ICD-10-CM

## 2021-06-03 LAB — TSH: TSH: 0.23 u[IU]/mL — ABNORMAL LOW (ref 0.35–5.50)

## 2021-06-04 ENCOUNTER — Other Ambulatory Visit: Payer: Self-pay

## 2021-06-04 DIAGNOSIS — E039 Hypothyroidism, unspecified: Secondary | ICD-10-CM

## 2021-06-04 MED ORDER — LEVOTHYROXINE SODIUM 88 MCG PO TABS: ORAL_TABLET | ORAL | 3 refills | Status: DC

## 2021-06-04 MED ORDER — LEVOTHYROXINE SODIUM 75 MCG PO TABS: ORAL_TABLET | ORAL | 0 refills | Status: DC

## 2021-06-04 NOTE — Telephone Encounter (Signed)
Please send Rx to [facility]

## 2021-07-22 ENCOUNTER — Other Ambulatory Visit: Payer: Medicare HMO

## 2021-07-23 ENCOUNTER — Other Ambulatory Visit (INDEPENDENT_AMBULATORY_CARE_PROVIDER_SITE_OTHER): Payer: Medicare HMO

## 2021-07-23 ENCOUNTER — Other Ambulatory Visit: Payer: Self-pay

## 2021-07-23 DIAGNOSIS — E039 Hypothyroidism, unspecified: Secondary | ICD-10-CM | POA: Diagnosis not present

## 2021-07-23 LAB — TSH: TSH: 0.29 u[IU]/mL — ABNORMAL LOW (ref 0.35–5.50)

## 2021-07-26 ENCOUNTER — Encounter: Payer: Self-pay | Admitting: Family Medicine

## 2021-07-26 ENCOUNTER — Other Ambulatory Visit: Payer: Self-pay | Admitting: Family Medicine

## 2021-07-26 DIAGNOSIS — E039 Hypothyroidism, unspecified: Secondary | ICD-10-CM

## 2021-07-27 MED ORDER — LEVOTHYROXINE SODIUM 75 MCG PO TABS: 75.0000 ug | ORAL_TABLET | Freq: Every day | ORAL | 1 refills | Status: DC

## 2021-08-10 ENCOUNTER — Telehealth: Payer: Self-pay | Admitting: Acute Care

## 2021-08-10 DIAGNOSIS — R0602 Shortness of breath: Secondary | ICD-10-CM

## 2021-08-10 MED ORDER — ALBUTEROL SULFATE HFA 108 (90 BASE) MCG/ACT IN AERS: 2.0000 | INHALATION_SPRAY | Freq: Four times a day (QID) | RESPIRATORY_TRACT | 11 refills | Status: DC | PRN

## 2021-08-10 NOTE — Telephone Encounter (Signed)
Rx for pt's albuterol inhaler has been sent to pharmacy for pt. Called and spoke with pt letting her know this had been done and she verbalized understanding. Nothing further needed. 

## 2021-09-23 ENCOUNTER — Ambulatory Visit (INDEPENDENT_AMBULATORY_CARE_PROVIDER_SITE_OTHER): Payer: Medicare HMO | Admitting: Primary Care

## 2021-09-23 ENCOUNTER — Encounter: Payer: Self-pay | Admitting: Primary Care

## 2021-09-23 ENCOUNTER — Other Ambulatory Visit: Payer: Self-pay

## 2021-09-23 VITALS — BP 140/62 | HR 69 | Temp 97.3°F | Ht 64.5 in | Wt 147.0 lb

## 2021-09-23 DIAGNOSIS — R051 Acute cough: Secondary | ICD-10-CM | POA: Diagnosis not present

## 2021-09-23 DIAGNOSIS — J069 Acute upper respiratory infection, unspecified: Secondary | ICD-10-CM | POA: Diagnosis not present

## 2021-09-23 LAB — POC COVID19 BINAXNOW: SARS Coronavirus 2 Ag: NEGATIVE

## 2021-09-23 MED ORDER — BENZONATATE 200 MG PO CAPS: 200.0000 mg | ORAL_CAPSULE | Freq: Three times a day (TID) | ORAL | 0 refills | Status: DC | PRN

## 2021-09-23 NOTE — Progress Notes (Signed)
Subjective:    Patient ID: Heather Cobb, female    DOB: 07-13-1941, 81 y.o.   MRN: [id number]  HPI  Heather Cobb is a very pleasant 81 y.o. female patient of Dr. Einar Pheasant with a history of hypertension, CAD, hypothyroidism, CKD, prediabetes who presents today to discuss cough. She is accompanied by her daughter.   Symptom onset four days ago with cough, hoarse voice. She's since developed runny nose, sneezing, post nasal drip. Her cough is deep and "hard" per her daughter. Cough begins in the evenings mostly, worse at bedtime.   She took a Covid-19 test one week ago for sneezing and it was negative. She took another Covid-19 test this morning which was negative.  Today she feels overall pretty good, better than two days ago.  Denies fevers, body aches. She's taken Nyquil, Mucinex DM.  She is compliant to her pantoprazole 40 mg. Also compliant to benzonotate 100 mg for which she takes once daily for chronic cough and lung scaring.   Review of Systems  Constitutional:  Negative for chills, fatigue and fever.  HENT:  Positive for congestion. Negative for sinus pressure and sore throat.   Respiratory:  Positive for cough. Negative for shortness of breath.         Past Medical History:  Diagnosis Date   Anemia    Carotid body tumor ([place])    left   Family history of colon cancer    Hypertension    Hypothyroid    Pyelonephritis    Rectal bleeding 11/26/2015   Vertigo    Vitamin B 12 deficiency     Social History   Socioeconomic History   Marital status: Widowed    Spouse name: Not on file   Number of children: 1   Years of education: high school   Highest education level: Not on file  Occupational History   Occupation: Engineer, manufacturing systems    Comment: retired  Tobacco Use   Smoking status: Never   Smokeless tobacco: Never  Vaping Use   Vaping Use: Never used  Substance and Sexual Activity   Alcohol use: No    Alcohol/week: 0.0 standard drinks   Drug use: No   Sexual  activity: Not Currently    Birth control/protection: Surgical  Other Topics Concern   Not on file  Social History Narrative   10/15/19   From: the area   Living: with Daugher [name]) and son-in-law   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, 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      Family: lives with daughter, has 1 grandson and 3 great-grandchildren and 1 great-great-grandchild      Enjoys: eat, dance, travel, cooking      Exercise: walk - not as much in cold, will do 1 mile/day   Diet: good, tries to eat health      Safety   Seat belts: Yes    Guns: Yes  and secure   Safe in relationships: Yes    Social Determinants of Health   Financial Resource Strain: Not on file  Food Insecurity: Not on file  Transportation Needs: Not on file  Physical Activity: Not on file  Stress: Not on file  Social Connections: Not on file  Intimate Partner Violence: Not on file    Past Surgical History:  Procedure Laterality Date   ABDOMINAL HYSTERECTOMY     CATARACT EXTRACTION W/ INTRAOCULAR LENS  IMPLANT, BILATERAL     CHOLECYSTECTOMY N/A 04/15/2015   Procedure: LAPAROSCOPIC CHOLECYSTECTOMY;  Surgeon: Aviva Signs, MD;  Location: [facility];  Service: General;  Laterality: N/A;   COLONOSCOPY  2012   [place]   COLONOSCOPY WITH PROPOFOL N/A 02/03/2016   Procedure: COLONOSCOPY WITH PROPOFOL;  Surgeon: Robert Bellow, MD;  Location: [facility]COPY;  Service: Endoscopy;  Laterality: N/A;   EYE SURGERY Bilateral    cataract extractions   HEMORRHOID SURGERY N/A 08/31/2018   Procedure: HEMORRHOIDECTOMY;  Surgeon: Robert Bellow, MD;  Location: [facility] OR[facility];  Service: General;  Laterality: N/A;   IR ANGIO EXTERNAL CAROTID SEL EXT CAROTID UNI L MOD SED  10/26/2017   IR ANGIO INTRA EXTRACRAN SEL COM CAROTID INNOMINATE BILAT MOD SED  10/26/2017   IR ANGIO VERTEBRAL SEL VERTEBRAL UNI R MOD SED  10/26/2017   IR ANGIOGRAM EXTREMITY LEFT  10/26/2017   IR ANGIOGRAM FOLLOW UP STUDY  10/26/2017   IR NEURO EACH ADD'L AFTER BASIC  UNI LEFT (MS)  10/26/2017   IR RADIOLOGIST EVAL & MGMT  09/21/2017   IR TRANSCATH/EMBOLIZ  10/26/2017   MASS EXCISION Left 10/27/2017   Procedure: EXCISION CAROTID BODY TUMOR;  Surgeon: Melida Quitter, MD;  Location: [place];  Service: ENT;  Laterality: Left;   RADIOLOGY WITH ANESTHESIA N/A 10/26/2017   Procedure: EMBOLIZATION;  Surgeon: Luanne Bras, MD;  Location: [place];  Service: Radiology;  Laterality: N/A;   SIGMOIDOSCOPY N/A 08/31/2018   Procedure: SIGMOIDOSCOPY-IN  OR;  Surgeon: Robert Bellow, MD;  Location: [facility];  Service: General;  Laterality: N/A;   UPPER GASTROINTESTINAL ENDOSCOPY  2014   VESICOVAGINAL FISTULA CLOSURE W/ TAH      Family History  Problem Relation Age of Onset   Asthma Mother    Kidney failure Mother    Hypertension Daughter    Colon cancer Father        dx in his 5s   Stomach cancer Sister        stage 4   Colon cancer Maternal Uncle    Heart attack Maternal Grandmother    Asthma Brother    Cancer Neg Hx     Allergies  Allergen Reactions   Codeine Nausea Only and Other (See Comments)    fever fever fever    Current Outpatient Medications on File Prior to Visit  Medication Sig Dispense Refill   acetaminophen (TYLENOL) 325 MG tablet Take 650 mg by mouth every 6 (six) hours as needed.     albuterol (VENTOLIN HFA) 108 (90 Base) MCG/ACT inhaler Inhale 2 puffs into the lungs every 6 (six) hours as needed for wheezing or shortness of breath. 18 g 11   aspirin EC 81 MG tablet Take 81 mg by mouth daily.     benzonatate (TESSALON) 100 MG capsule TAKE ONE CAPSULE BY MOUTH TWICE A DAY AS NEEDED FOR COUGH 90 capsule 1   cholecalciferol (VITAMIN D) 1000 UNITS tablet Take 1,000 Units by mouth daily.     clotrimazole-betamethasone (LOTRISONE) cream APPLY TOPICALLY TO THE AFFECTED AREA TWICE DAILY 30 g 3   ferrous sulfate 325 (65 FE) MG tablet Take 325 mg by mouth daily with breakfast.     levalbuterol (XOPENEX HFA) 45 MCG/ACT inhaler Inhale 1-2 puffs into  the lungs every 4 (four) hours as needed for wheezing. 1 Inhaler 0   levothyroxine (SYNTHROID) 75 MCG tablet Take 1 tablet (75 mcg total) by mouth daily before breakfast. 90 tablet 1   linaclotide (LINZESS) 145 MCG CAPS capsule Take 1 capsule (145 mcg total) by mouth daily as needed (for constipation).  90 capsule 3   mometasone (NASONEX) 50 MCG/ACT nasal spray Place 2 sprays into the nose daily. (Patient taking differently: Place 2 sprays into the nose as needed.) 1 g 0   olmesartan (BENICAR) 40 MG tablet TAKE 1 TABLET(40 MG) BY MOUTH DAILY 90 tablet 1   pantoprazole (PROTONIX) 40 MG tablet TAKE 1 TABLET(40 MG) BY MOUTH DAILY 90 tablet 3   spironolactone (ALDACTONE) 25 MG tablet Take 1 tablet by mouth daily.     vitamin B-12 (CYANOCOBALAMIN) 1000 MCG tablet Take 1,000 mcg by mouth daily.      hydrochlorothiazide (HYDRODIURIL) 12.5 MG tablet TAKE 1 TABLET(12.5 MG) BY MOUTH DAILY (Patient not taking: Reported on 09/23/2021) 90 tablet 1   No current facility-administered medications on file prior to visit.    BP 140/62    Pulse 69    Temp (!) 97.3 F (36.3 C) (Temporal)    Ht 5' 4.5" (1.638 m)    Wt 147 lb (66.7 kg)    SpO2 94%    BMI 24.84 kg/m  Objective:   Physical Exam HENT:     Right Ear: Tympanic membrane and ear canal normal.     Left Ear: Tympanic membrane and ear canal normal.     Nose:     Right Sinus: No maxillary sinus tenderness or frontal sinus tenderness.     Left Sinus: No maxillary sinus tenderness or frontal sinus tenderness.     Mouth/Throat:     Pharynx: No posterior oropharyngeal erythema.  Eyes:     Conjunctiva/sclera: Conjunctivae normal.  Cardiovascular:     Rate and Rhythm: Normal rate and regular rhythm.  Pulmonary:     Effort: Pulmonary effort is normal.     Breath sounds: Normal breath sounds. No wheezing or rales.  Musculoskeletal:     Cervical back: Neck supple.  Lymphadenopathy:     Cervical: No cervical adenopathy.  Skin:    General: Skin is warm and  dry.          Assessment & Plan:      This visit occurred during the SARS-CoV-2 public health emergency.  Safety protocols were in place, including screening questions prior to the visit, additional usage of staff PPE, and extensive cleaning of exam room while observing appropriate contact time as indicated for disinfecting solutions.

## 2021-09-23 NOTE — Assessment & Plan Note (Addendum)
Symptoms, presentation, HPI consistent for viral etiology. She appears stable and well. Lungs clear.  Covid-19 test negative.  Fortunately she's feeling better so we decided to treat conservatively.  Will have her hold her benzonatate 100 mg, new Rx for benzonatate 200 mg sent to pharmacy. Discussed directions for use.  Strict return precautions provided.

## 2021-09-23 NOTE — Patient Instructions (Signed)
Hold your benzonatate 100 mg cough pills for now.  You may take Benzonatate 200 capsules for cough. Take 1 capsule by mouth three times daily as needed for cough.  Please call us early next week if you are getting worse and/or not improving.  It was a pleasure to see you today!

## 2021-09-30 ENCOUNTER — Other Ambulatory Visit: Payer: Self-pay | Admitting: Primary Care

## 2021-09-30 DIAGNOSIS — J069 Acute upper respiratory infection, unspecified: Secondary | ICD-10-CM

## 2021-10-02 ENCOUNTER — Other Ambulatory Visit: Payer: Self-pay | Admitting: Family Medicine

## 2021-10-02 DIAGNOSIS — J069 Acute upper respiratory infection, unspecified: Secondary | ICD-10-CM

## 2021-10-02 MED ORDER — BENZONATATE 200 MG PO CAPS: 200.0000 mg | ORAL_CAPSULE | Freq: Three times a day (TID) | ORAL | 0 refills | Status: DC | PRN

## 2021-10-12 ENCOUNTER — Telehealth: Payer: Self-pay | Admitting: Primary Care

## 2021-10-12 ENCOUNTER — Telehealth: Payer: Self-pay | Admitting: Family Medicine

## 2021-10-12 DIAGNOSIS — J069 Acute upper respiratory infection, unspecified: Secondary | ICD-10-CM

## 2021-10-12 DIAGNOSIS — I10 Essential (primary) hypertension: Secondary | ICD-10-CM

## 2021-10-12 MED ORDER — SPIRONOLACTONE 25 MG PO TABS: 25.0000 mg | ORAL_TABLET | Freq: Every day | ORAL | 0 refills | Status: DC

## 2021-10-12 MED ORDER — BENZONATATE 200 MG PO CAPS: 200.0000 mg | ORAL_CAPSULE | Freq: Three times a day (TID) | ORAL | 2 refills | Status: DC | PRN

## 2021-10-12 NOTE — Telephone Encounter (Signed)
This should have been sent to [facility]. Do not think that this was given by Dr. Einar Pheasant in the past please advise.

## 2021-10-12 NOTE — Telephone Encounter (Signed)
Pt needs a refill on benzonatate (TESSALON) 100 MG capsule sent to the [facility]

## 2021-10-12 NOTE — Telephone Encounter (Signed)
Patient called requesting a refill on the following, please:  1.Medication Requested:  spironolactone (ALDACTONE) 25 MG tablet  2. Pharmacy (Name, Street, [facility]): [facility] [place], [place] - [address]  [address], [place] [address]   3. On Med List:  Yes  4. Last Visit with PCP:  09/23/21  5. Next visit date with PCP:  None   Agent: Please be advised that RX refills may take up to 3 business days. We ask that you follow-up with your pharmacy.

## 2021-10-12 NOTE — Addendum Note (Signed)
Addended by: Loreen Freud on: 10/12/2021 03:18 PM   Modules accepted: Orders

## 2021-10-12 NOTE — Telephone Encounter (Signed)
Reviewed office notes from Dr. Einar Pheasant from June 2022. Refill sent to pharmacy for spironolactone 25 mg.

## 2021-10-14 DIAGNOSIS — D649 Anemia, unspecified: Secondary | ICD-10-CM | POA: Diagnosis not present

## 2021-10-14 DIAGNOSIS — I10 Essential (primary) hypertension: Secondary | ICD-10-CM | POA: Diagnosis not present

## 2021-10-14 DIAGNOSIS — R799 Abnormal finding of blood chemistry, unspecified: Secondary | ICD-10-CM | POA: Diagnosis not present

## 2021-10-14 DIAGNOSIS — E039 Hypothyroidism, unspecified: Secondary | ICD-10-CM | POA: Diagnosis not present

## 2021-10-14 DIAGNOSIS — E538 Deficiency of other specified B group vitamins: Secondary | ICD-10-CM | POA: Diagnosis not present

## 2021-10-14 DIAGNOSIS — N1831 Chronic kidney disease, stage 3a: Secondary | ICD-10-CM | POA: Diagnosis not present

## 2021-10-25 ENCOUNTER — Other Ambulatory Visit: Payer: Self-pay | Admitting: Family Medicine

## 2021-10-25 DIAGNOSIS — I10 Essential (primary) hypertension: Secondary | ICD-10-CM

## 2021-10-27 ENCOUNTER — Other Ambulatory Visit: Payer: Self-pay | Admitting: Family Medicine

## 2021-10-27 ENCOUNTER — Encounter: Payer: Self-pay | Admitting: *Deleted

## 2021-10-27 DIAGNOSIS — I10 Essential (primary) hypertension: Secondary | ICD-10-CM

## 2021-10-27 DIAGNOSIS — R053 Chronic cough: Secondary | ICD-10-CM

## 2021-10-27 DIAGNOSIS — J984 Other disorders of lung: Secondary | ICD-10-CM

## 2021-10-27 DIAGNOSIS — E039 Hypothyroidism, unspecified: Secondary | ICD-10-CM

## 2021-10-27 DIAGNOSIS — N1831 Chronic kidney disease, stage 3a: Secondary | ICD-10-CM

## 2021-10-27 MED ORDER — BENZONATATE 100 MG PO CAPS: ORAL_CAPSULE | ORAL | 1 refills | Status: DC

## 2021-10-27 MED ORDER — SPIRONOLACTONE 25 MG PO TABS: 25.0000 mg | ORAL_TABLET | Freq: Every day | ORAL | 1 refills | Status: DC

## 2021-10-27 MED ORDER — SPIRONOLACTONE 25 MG PO TABS: 25.0000 mg | ORAL_TABLET | Freq: Every day | ORAL | 0 refills | Status: DC

## 2021-10-27 MED ORDER — BENZONATATE 100 MG PO CAPS: 100.0000 mg | ORAL_CAPSULE | Freq: Three times a day (TID) | ORAL | 0 refills | Status: DC | PRN

## 2021-10-27 NOTE — Progress Notes (Signed)
Called pt to discuss refills  Routing to team to schedule lab appointment for BMP and TSH for next 1-2 weeks  AND medicare wellness visit with health nurse and f/u visit with me in next 2-3 months.   New referral to endocrine placed for thyroid disease as previous provider left practice before she was able to be seen  Refills provided.

## 2021-10-29 ENCOUNTER — Ambulatory Visit: Payer: Medicare HMO | Admitting: Family Medicine

## 2021-11-19 DIAGNOSIS — Z1231 Encounter for screening mammogram for malignant neoplasm of breast: Secondary | ICD-10-CM | POA: Diagnosis not present

## 2021-11-19 LAB — HM MAMMOGRAPHY

## 2021-11-24 ENCOUNTER — Other Ambulatory Visit: Payer: Self-pay | Admitting: Family Medicine

## 2021-11-24 MED ORDER — OLMESARTAN MEDOXOMIL 40 MG PO TABS: ORAL_TABLET | ORAL | 3 refills | Status: DC

## 2021-11-26 ENCOUNTER — Encounter: Payer: Self-pay | Admitting: Family Medicine

## 2021-12-22 ENCOUNTER — Ambulatory Visit (INDEPENDENT_AMBULATORY_CARE_PROVIDER_SITE_OTHER): Payer: Medicare HMO | Admitting: Family Medicine

## 2021-12-22 VITALS — BP 142/62 | HR 53 | Temp 97.6°F | Ht 64.5 in | Wt 146.1 lb

## 2021-12-22 DIAGNOSIS — M545 Low back pain, unspecified: Secondary | ICD-10-CM | POA: Diagnosis not present

## 2021-12-22 DIAGNOSIS — M8580 Other specified disorders of bone density and structure, unspecified site: Secondary | ICD-10-CM

## 2021-12-22 NOTE — Patient Instructions (Addendum)
Home exercise  ?- 4-6 weeks home exercise ?- walking as tolerated ?- if no improvement or worsening would recommend PT ? ? ?Please call the location of your choice from the menu below to schedule your  Bone Density appointment.   ? ?[place]  ? ?[facility]                ?      Phone:  [phone] ?[address]                               ?[place], [place] [address]                                                             ?Services: Traditional and 3D Mammogram, Bone Density  ? ? ? ?

## 2021-12-22 NOTE — Assessment & Plan Note (Signed)
Unable to elicit sciatic symptoms with straight leg raise.  Advised home exercise routine and update if no improvement in a couple of weeks as would recommend therapy and/or x-ray at that time.  Continue Tylenol as needed for pain ?

## 2021-12-22 NOTE — Assessment & Plan Note (Signed)
Last DEXA was in 2018, due for follow-up.  Does not have bony tenderness on exam but if symptoms not improving would likely get x-ray ?

## 2021-12-22 NOTE — Progress Notes (Signed)
? ?Subjective:  ? ?  ?Heather Cobb is a 81 y.o. female presenting for Sciatica (In R leg ) ?  ? ? ?Back Pain ?This is a new problem. The current episode started 1 to 4 weeks ago. The problem occurs intermittently. The pain is present in the gluteal. The quality of the pain is described as shooting. The pain radiates to the right knee and right thigh. The pain is at a severity of 4/10. Pain severity now: up to an 8. Exacerbated by: walking. Associated symptoms include leg pain. Pertinent negatives include no bladder incontinence, bowel incontinence, headaches, numbness, tingling or weakness. Treatments tried: tylenol improves symptoms.  ? ? ? ?Review of Systems  ?Gastrointestinal:  Negative for bowel incontinence.  ?Genitourinary:  Negative for bladder incontinence.  ?Musculoskeletal:  Positive for back pain.  ?Neurological:  Negative for tingling, weakness, numbness and headaches.  ? ? ?Social History  ? ?Tobacco Use  ?Smoking Status Never  ?Smokeless Tobacco Never  ? ? ? ?   ?Objective:  ?  ?BP Readings from Last 3 Encounters:  ?12/22/21 (!) 142/62  ?09/23/21 140/62  ?04/12/21 120/70  ? ?Wt Readings from Last 3 Encounters:  ?12/22/21 146 lb 2 oz (66.3 kg)  ?09/23/21 147 lb (66.7 kg)  ?04/12/21 148 lb 12.8 oz (67.5 kg)  ? ? ?BP (!) 142/62   Pulse (!) 53   Temp 97.6 ?F (36.4 ?C) (Oral)   Ht 5' 4.5" (1.638 m)   Wt 146 lb 2 oz (66.3 kg)   SpO2 98%   BMI 24.69 kg/m?  ? ? ?Physical Exam ?Constitutional:   ?   General: She is not in acute distress. ?   Appearance: She is well-developed. She is not diaphoretic.  ?HENT:  ?   Right Ear: External ear normal.  ?   Left Ear: External ear normal.  ?   Nose: Nose normal.  ?Eyes:  ?   Conjunctiva/sclera: Conjunctivae normal.  ?Cardiovascular:  ?   Rate and Rhythm: Normal rate and regular rhythm.  ?Pulmonary:  ?   Effort: Pulmonary effort is normal. No respiratory distress.  ?   Breath sounds: Normal breath sounds. No wheezing.  ?Musculoskeletal:  ?   Cervical back: Neck  supple.  ?   Comments: Back ?Inspection: lordosis ?Palpation: no spinous or paraspinous ttp, does have some right glute ttp ?ROM: normal ?Strength: normal ?Straight leg raise: negative  ?Skin: ?   General: Skin is warm and dry.  ?   Capillary Refill: Capillary refill takes less than 2 seconds.  ?Neurological:  ?   Mental Status: She is alert. Mental status is at baseline.  ?Psychiatric:     ?   Mood and Affect: Mood normal.     ?   Behavior: Behavior normal.  ? ? ? ? ? ?   ?Assessment & Plan:  ? ?Problem List Items Addressed This Visit   ? ?  ? Musculoskeletal and Integument  ? Osteopenia  ?  Last DEXA was in 2018, due for follow-up.  Does not have bony tenderness on exam but if symptoms not improving would likely get x-ray ?  ?  ? Relevant Orders  ? DG Bone Density  ?  ? Other  ? Acute right-sided low back pain without sciatica - Primary  ?  Unable to elicit sciatic symptoms with straight leg raise.  Advised home exercise routine and update if no improvement in a couple of weeks as would recommend therapy and/or x-ray at that time.  Continue Tylenol as needed for pain ?  ?  ? ? ? ?Return if symptoms worsen or fail to improve. ? ?Lesleigh Noe, MD ? ?This visit occurred during the SARS-CoV-2 public health emergency.  Safety protocols were in place, including screening questions prior to the visit, additional usage of staff PPE, and extensive cleaning of exam room while observing appropriate contact time as indicated for disinfecting solutions.  ? ?

## 2022-01-09 ENCOUNTER — Other Ambulatory Visit: Payer: Self-pay | Admitting: Family Medicine

## 2022-01-09 DIAGNOSIS — E039 Hypothyroidism, unspecified: Secondary | ICD-10-CM

## 2022-01-12 ENCOUNTER — Ambulatory Visit (INDEPENDENT_AMBULATORY_CARE_PROVIDER_SITE_OTHER)
Admission: RE | Admit: 2022-01-12 | Discharge: 2022-01-12 | Disposition: A | Discharge status: Home / Self Care | Payer: Medicare HMO | Source: Ambulatory Visit | Attending: Family Medicine | Admitting: Family Medicine

## 2022-01-12 ENCOUNTER — Ambulatory Visit (INDEPENDENT_AMBULATORY_CARE_PROVIDER_SITE_OTHER): Payer: Medicare HMO | Admitting: Family Medicine

## 2022-01-12 VITALS — BP 130/62 | HR 59 | Temp 98.0°F | Ht 64.5 in | Wt 146.0 lb

## 2022-01-12 DIAGNOSIS — R7303 Prediabetes: Secondary | ICD-10-CM | POA: Diagnosis not present

## 2022-01-12 DIAGNOSIS — M545 Low back pain, unspecified: Secondary | ICD-10-CM

## 2022-01-12 DIAGNOSIS — N281 Cyst of kidney, acquired: Secondary | ICD-10-CM | POA: Diagnosis not present

## 2022-01-12 DIAGNOSIS — N1832 Chronic kidney disease, stage 3b: Secondary | ICD-10-CM | POA: Diagnosis not present

## 2022-01-12 DIAGNOSIS — E538 Deficiency of other specified B group vitamins: Secondary | ICD-10-CM | POA: Diagnosis not present

## 2022-01-12 DIAGNOSIS — L309 Dermatitis, unspecified: Secondary | ICD-10-CM

## 2022-01-12 DIAGNOSIS — R799 Abnormal finding of blood chemistry, unspecified: Secondary | ICD-10-CM | POA: Diagnosis not present

## 2022-01-12 DIAGNOSIS — D649 Anemia, unspecified: Secondary | ICD-10-CM | POA: Diagnosis not present

## 2022-01-12 DIAGNOSIS — E785 Hyperlipidemia, unspecified: Secondary | ICD-10-CM | POA: Diagnosis not present

## 2022-01-12 DIAGNOSIS — E039 Hypothyroidism, unspecified: Secondary | ICD-10-CM | POA: Diagnosis not present

## 2022-01-12 DIAGNOSIS — I10 Essential (primary) hypertension: Secondary | ICD-10-CM | POA: Diagnosis not present

## 2022-01-12 MED ORDER — PREDNISONE 20 MG PO TABS
ORAL_TABLET | ORAL | 0 refills | Status: AC
Start: 2022-01-12 — End: 2022-01-21

## 2022-01-12 NOTE — Patient Instructions (Signed)
Steroids ?- take for 9 days ?- should help with leg symptoms and rash ?- if rash returns call and can try cream ? ?If back pain does not improve then would recommend physical therapy ?

## 2022-01-12 NOTE — Progress Notes (Signed)
? ?Subjective:  ? ?  ?Heather Cobb is a 81 y.o. female presenting for Sciatica and Rash (On arms and legs. ) ?  ? ? ?Rash ? ? ?#Rash ?- started itching on the left arm Monday ?- then started breaking out in welts on both arms and legs ?- was after the shower on Monday ? ?#Back pain ?- terrible ?- doing her exercise but some she cannot do ?- treatment - tylenol w/ some improvement ?- still getting symptoms down the right leg ? ? ? ?Review of Systems  ?Skin:  Positive for rash.  ? ? ?Social History  ? ?Tobacco Use  ?Smoking Status Never  ?Smokeless Tobacco Never  ? ? ? ?   ?Objective:  ?  ?BP Readings from Last 3 Encounters:  ?01/12/22 130/62  ?12/22/21 (!) 142/62  ?09/23/21 140/62  ? ?Wt Readings from Last 3 Encounters:  ?01/12/22 146 lb (66.2 kg)  ?12/22/21 146 lb 2 oz (66.3 kg)  ?09/23/21 147 lb (66.7 kg)  ? ? ?BP 130/62   Pulse (!) 59   Temp 98 ?F (36.7 ?C) (Oral)   Ht 5' 4.5" (1.638 m)   Wt 146 lb (66.2 kg)   SpO2 98%   BMI 24.67 kg/m?  ? ? ?Physical Exam ?Constitutional:   ?   General: She is not in acute distress. ?   Appearance: She is well-developed. She is not diaphoretic.  ?HENT:  ?   Right Ear: External ear normal.  ?   Left Ear: External ear normal.  ?   Nose: Nose normal.  ?Eyes:  ?   Conjunctiva/sclera: Conjunctivae normal.  ?Cardiovascular:  ?   Rate and Rhythm: Normal rate.  ?Pulmonary:  ?   Effort: Pulmonary effort is normal.  ?Musculoskeletal:  ?   Cervical back: Neck supple.  ?   Comments: Back ?Inspection: kyphosis ?Palpation: no spinous or paraspinous ttp ?ROM: normal ?Strength: normal ?Straight leg raise - negative ?  ?Skin: ?   General: Skin is warm and dry.  ?   Capillary Refill: Capillary refill takes less than 2 seconds.  ?   Comments: Faint erythematous rash w/o raised lesions  ?Neurological:  ?   Mental Status: She is alert. Mental status is at baseline.  ?   Deep Tendon Reflexes: Reflexes normal.  ?Psychiatric:     ?   Mood and Affect: Mood normal.     ?   Behavior: Behavior  normal.  ? ? ? ?Lumbar XR (my read): no obvious fracture, joint space seems appropriate. Moderate stool ? ?   ?Assessment & Plan:  ? ?Problem List Items Addressed This Visit   ? ?  ? Musculoskeletal and Integument  ? Dermatitis  ?  Discussed this may have heat been heat related given that it happened after the shower.  It is over her bilateral arms and legs reasonable to do oral steroids in setting of also back pain.  Discussed that it could return after treatment in which case would recommend trial of topical cream at that time. ? ?  ?  ? Relevant Medications  ? predniSONE (DELTASONE) 20 MG tablet  ?  ? Other  ? Acute right-sided low back pain without sciatica - Primary  ?  X-ray today reassuring.  Again negative straight leg raise.  However in setting of dermatitis reaction as well as persistent pain reasonable to try a course of steroids to see if this resolves her leg pain.  Discussed side effects including increased hunger insomnia and  irritability.  Take steroids for 9 days.  If no response discussed physical therapy referral.  Final x-ray read ? ?  ?  ? Relevant Medications  ? predniSONE (DELTASONE) 20 MG tablet  ? Other Relevant Orders  ? DG Lumbar Spine Complete  ? ? ? ?Return if symptoms worsen or fail to improve. ? ?Lesleigh Noe, MD ? ? ? ?

## 2022-01-12 NOTE — Assessment & Plan Note (Signed)
Discussed this may have heat been heat related given that it happened after the shower.  It is over her bilateral arms and legs reasonable to do oral steroids in setting of also back pain.  Discussed that it could return after treatment in which case would recommend trial of topical cream at that time. ?

## 2022-01-12 NOTE — Assessment & Plan Note (Signed)
X-ray today reassuring.  Again negative straight leg raise.  However in setting of dermatitis reaction as well as persistent pain reasonable to try a course of steroids to see if this resolves her leg pain.  Discussed side effects including increased hunger insomnia and irritability.  Take steroids for 9 days.  If no response discussed physical therapy referral.  Final x-ray read ?

## 2022-01-13 DIAGNOSIS — I10 Essential (primary) hypertension: Secondary | ICD-10-CM | POA: Diagnosis not present

## 2022-01-13 DIAGNOSIS — E785 Hyperlipidemia, unspecified: Secondary | ICD-10-CM | POA: Diagnosis not present

## 2022-01-13 DIAGNOSIS — N1832 Chronic kidney disease, stage 3b: Secondary | ICD-10-CM | POA: Diagnosis not present

## 2022-01-13 DIAGNOSIS — E039 Hypothyroidism, unspecified: Secondary | ICD-10-CM | POA: Diagnosis not present

## 2022-01-13 DIAGNOSIS — D649 Anemia, unspecified: Secondary | ICD-10-CM | POA: Diagnosis not present

## 2022-01-13 DIAGNOSIS — N281 Cyst of kidney, acquired: Secondary | ICD-10-CM | POA: Diagnosis not present

## 2022-01-13 DIAGNOSIS — R7303 Prediabetes: Secondary | ICD-10-CM | POA: Diagnosis not present

## 2022-01-13 DIAGNOSIS — E538 Deficiency of other specified B group vitamins: Secondary | ICD-10-CM | POA: Diagnosis not present

## 2022-01-13 DIAGNOSIS — R799 Abnormal finding of blood chemistry, unspecified: Secondary | ICD-10-CM | POA: Diagnosis not present

## 2022-02-08 ENCOUNTER — Telehealth: Payer: Self-pay | Admitting: Family Medicine

## 2022-02-08 NOTE — Telephone Encounter (Signed)
Spoke to pt's dtr and scheduled pt for tomorrow at 102

## 2022-02-08 NOTE — Telephone Encounter (Signed)
Diresa Forensic scientist (Daughter) called and wanted to speak with someone about her mom. She stated "she is having back pain that has moved to her feet." She last saw Dr. Einar Pheasant on 12/22/2021, I transferred them to Access Nurse.   Callback Number: [phone]

## 2022-02-09 ENCOUNTER — Ambulatory Visit: Payer: Medicare HMO | Admitting: Family Medicine

## 2022-02-09 ENCOUNTER — Ambulatory Visit
Admission: RE | Admit: 2022-02-09 | Discharge: 2022-02-09 | Disposition: A | Discharge status: Home / Self Care | Payer: Medicare HMO | Source: Ambulatory Visit | Attending: Family Medicine | Admitting: Family Medicine

## 2022-02-09 ENCOUNTER — Ambulatory Visit (INDEPENDENT_AMBULATORY_CARE_PROVIDER_SITE_OTHER): Payer: Medicare HMO | Admitting: Family Medicine

## 2022-02-09 VITALS — BP 120/60 | HR 55 | Temp 97.8°F | Ht 64.5 in | Wt 151.1 lb

## 2022-02-09 DIAGNOSIS — R2241 Localized swelling, mass and lump, right lower limb: Secondary | ICD-10-CM | POA: Insufficient documentation

## 2022-02-09 DIAGNOSIS — M7989 Other specified soft tissue disorders: Secondary | ICD-10-CM | POA: Diagnosis not present

## 2022-02-09 DIAGNOSIS — K5900 Constipation, unspecified: Secondary | ICD-10-CM | POA: Diagnosis not present

## 2022-02-09 DIAGNOSIS — M5441 Lumbago with sciatica, right side: Secondary | ICD-10-CM | POA: Diagnosis not present

## 2022-02-09 MED ORDER — LINACLOTIDE 145 MCG PO CAPS: 145.0000 ug | ORAL_CAPSULE | Freq: Every day | ORAL | 3 refills | Status: DC | PRN

## 2022-02-09 NOTE — Assessment & Plan Note (Signed)
Controlled on linzess. Refill provided

## 2022-02-09 NOTE — Assessment & Plan Note (Signed)
Symptoms going on for more than 6 weeks.  Some response to steroids, however symptoms returned 1 week after stopping.  Does have positive straight leg raise today.  Has been doing home physical therapy.  X-ray with some degenerative changes.  Discussed referral to physical therapy, MRI, and referral to PMR for options of alternative interventions. She is not interested in surgery at this time.

## 2022-02-09 NOTE — Progress Notes (Signed)
Subjective:     Heather Cobb is a 81 y.o. female presenting for Sciatica (Right sided )     Back Pain This is a chronic problem. The current episode started in the past 7 days. The pain is present in the lumbar spine and gluteal. The quality of the pain is described as aching. The pain radiates to the right thigh and right knee. The pain is at a severity of 8/10. Exacerbated by: moving around. Pertinent negatives include no bladder incontinence, bowel incontinence, fever, numbness, tingling or weakness.   Steroids on 01/12/2022 - with improvement in pain, did have some increased hunger  Has been doing the home handout of exercises  Did resolve with steroids After about 1 week symptoms returned   Review of Systems  Constitutional:  Negative for fever.  Gastrointestinal:  Negative for bowel incontinence.  Genitourinary:  Negative for bladder incontinence.  Musculoskeletal:  Positive for back pain.  Neurological:  Negative for tingling, weakness and numbness.    Social History   Tobacco Use  Smoking Status Never  Smokeless Tobacco Never        Objective:    BP Readings from Last 3 Encounters:  02/09/22 120/60  01/12/22 130/62  12/22/21 (!) 142/62   Wt Readings from Last 3 Encounters:  02/09/22 151 lb 2 oz (68.5 kg)  01/12/22 146 lb (66.2 kg)  12/22/21 146 lb 2 oz (66.3 kg)    BP 120/60   Pulse (!) 55   Temp 97.8 F (36.6 C) (Temporal)   Ht 5' 4.5" (1.638 m)   Wt 151 lb 2 oz (68.5 kg)   SpO2 96%   BMI 25.54 kg/m    Physical Exam Constitutional:      General: She is not in acute distress.    Appearance: She is well-developed. She is not diaphoretic.  HENT:     Right Ear: External ear normal.     Left Ear: External ear normal.     Nose: Nose normal.  Eyes:     Conjunctiva/sclera: Conjunctivae normal.  Cardiovascular:     Rate and Rhythm: Normal rate.  Pulmonary:     Effort: Pulmonary effort is normal.  Musculoskeletal:     Cervical back: Neck  supple.     Right lower leg: Edema (trace) present.     Comments: Back Inspection: no abnormalities Strength: Normal bilateral hip flexion/extension, knee flexion/extension, dorsiflexion/extension Straight leg raise - positive on the right Reflexes normal    Skin:    General: Skin is warm and dry.     Capillary Refill: Capillary refill takes less than 2 seconds.  Neurological:     Mental Status: She is alert. Mental status is at baseline.  Psychiatric:        Mood and Affect: Mood normal.        Behavior: Behavior normal.          Assessment & Plan:   Problem List Items Addressed This Visit       Other   Constipation    Controlled on linzess. Refill provided       Relevant Medications   linaclotide (LINZESS) 145 MCG CAPS capsule   Acute right-sided low back pain without sciatica - Primary    Symptoms going on for more than 6 weeks.  Some response to steroids, however symptoms returned 1 week after stopping.  Does have positive straight leg raise today.  Has been doing home physical therapy.  X-ray with some degenerative changes.  Discussed referral  to physical therapy, MRI, and referral to PMR for options of alternative interventions. She is not interested in surgery at this time.       Localized swelling of right lower leg    Mild asymmetry between the right and left leg.  Discussed that this could be secondary to her back pain and impacting the right side.  However, not fully rule out a blood clot.  We will get ultrasound to evaluate for possible blood clot.       Relevant Orders   US Venous Img Lower Unilateral Right (DVT)   I spent 35 minutes with pt , obtaining history, examining, reviewing chart, documenting encounter and discussing the above plan of care.    Return if symptoms worsen or fail to improve.  Lesleigh Noe, MD

## 2022-02-09 NOTE — Telephone Encounter (Signed)
See note below access note; Alyse Low CMA has already spoken with pt and scheduled appt.    [place] Night - Client Nonclinical Telephone Record  [facility] Client [place] Night - Client Client Site [place] Provider Waunita Schooner- MD Contact Type Call Who Is Calling Patient / Member / Family / Caregiver Caller Name Maryori Weide Caller Phone Number [phone] Call Type Message Only Information Provided Reason for Call Returning a Call from the Office Initial [place] states is returning call. Additional Comment Caller provided with office hours. Disp. Time Disposition Final User 02/08/2022 5:45:06 PM General Information Provided Yes Otto Herb Call Closed By: Otto Herb Transaction Date/Time: 02/08/2022 5:38:42 PM (ET

## 2022-02-09 NOTE — Patient Instructions (Signed)
Referral to physical therapy  MRI order for back

## 2022-02-09 NOTE — Assessment & Plan Note (Signed)
Mild asymmetry between the right and left leg.  Discussed that this could be secondary to her back pain and impacting the right side.  However, not fully rule out a blood clot.  We will get ultrasound to evaluate for possible blood clot.

## 2022-02-11 ENCOUNTER — Ambulatory Visit: Payer: Medicare HMO | Admitting: Family Medicine

## 2022-02-16 ENCOUNTER — Encounter: Payer: Self-pay | Admitting: Physical Medicine & Rehabilitation

## 2022-02-18 ENCOUNTER — Ambulatory Visit
Admission: RE | Admit: 2022-02-18 | Discharge: 2022-02-18 | Disposition: A | Discharge status: Home / Self Care | Payer: Medicare HMO | Source: Ambulatory Visit | Attending: Family Medicine | Admitting: Family Medicine

## 2022-02-18 DIAGNOSIS — M5441 Lumbago with sciatica, right side: Secondary | ICD-10-CM

## 2022-02-18 DIAGNOSIS — M545 Low back pain, unspecified: Secondary | ICD-10-CM | POA: Diagnosis not present

## 2022-02-21 ENCOUNTER — Other Ambulatory Visit: Payer: Self-pay | Admitting: Family Medicine

## 2022-02-21 DIAGNOSIS — R053 Chronic cough: Secondary | ICD-10-CM

## 2022-02-21 DIAGNOSIS — J984 Other disorders of lung: Secondary | ICD-10-CM

## 2022-02-21 DIAGNOSIS — Z800 Family history of malignant neoplasm of digestive organs: Secondary | ICD-10-CM

## 2022-02-21 DIAGNOSIS — R937 Abnormal findings on diagnostic imaging of other parts of musculoskeletal system: Secondary | ICD-10-CM

## 2022-02-21 MED ORDER — BENZONATATE 100 MG PO CAPS: 100.0000 mg | ORAL_CAPSULE | Freq: Three times a day (TID) | ORAL | 0 refills | Status: DC | PRN

## 2022-02-23 ENCOUNTER — Ambulatory Visit (INDEPENDENT_AMBULATORY_CARE_PROVIDER_SITE_OTHER)
Admission: RE | Admit: 2022-02-23 | Discharge: 2022-02-23 | Disposition: A | Discharge status: Home / Self Care | Payer: Medicare HMO | Source: Ambulatory Visit | Attending: Family Medicine | Admitting: Family Medicine

## 2022-02-23 ENCOUNTER — Other Ambulatory Visit (INDEPENDENT_AMBULATORY_CARE_PROVIDER_SITE_OTHER): Payer: Medicare HMO

## 2022-02-23 DIAGNOSIS — R053 Chronic cough: Secondary | ICD-10-CM

## 2022-02-23 DIAGNOSIS — Z800 Family history of malignant neoplasm of digestive organs: Secondary | ICD-10-CM

## 2022-02-23 DIAGNOSIS — E039 Hypothyroidism, unspecified: Secondary | ICD-10-CM

## 2022-02-23 DIAGNOSIS — R059 Cough, unspecified: Secondary | ICD-10-CM | POA: Diagnosis not present

## 2022-02-23 DIAGNOSIS — I10 Essential (primary) hypertension: Secondary | ICD-10-CM | POA: Diagnosis not present

## 2022-02-23 DIAGNOSIS — N1831 Chronic kidney disease, stage 3a: Secondary | ICD-10-CM

## 2022-02-23 DIAGNOSIS — R937 Abnormal findings on diagnostic imaging of other parts of musculoskeletal system: Secondary | ICD-10-CM

## 2022-02-23 NOTE — Progress Notes (Signed)
Erroneous encounter

## 2022-02-23 NOTE — Addendum Note (Signed)
Addended by: Ellamae Sia on: 02/23/2022 02:14 PM   Modules accepted: Orders

## 2022-02-24 LAB — BASIC METABOLIC PANEL
BUN: 16 mg/dL (ref 6–23)
CO2: 29 mEq/L (ref 19–32)
Calcium: 10.4 mg/dL (ref 8.4–10.5)
Chloride: 104 mEq/L (ref 96–112)
Creatinine, Ser: 1.21 mg/dL — ABNORMAL HIGH (ref 0.40–1.20)
GFR: 42.11 mL/min — ABNORMAL LOW (ref >60.00)
Glucose, Bld: 117 mg/dL — ABNORMAL HIGH (ref 70–99)
Potassium: 3.8 mEq/L (ref 3.5–5.1)
Sodium: 141 mEq/L (ref 135–145)

## 2022-02-24 LAB — TSH: TSH: 1.47 u[IU]/mL (ref 0.35–5.50)

## 2022-02-28 ENCOUNTER — Other Ambulatory Visit: Payer: Self-pay

## 2022-02-28 ENCOUNTER — Ambulatory Visit: Payer: Medicare HMO | Attending: Family Medicine | Admitting: Physical Therapy

## 2022-02-28 DIAGNOSIS — M5441 Lumbago with sciatica, right side: Secondary | ICD-10-CM | POA: Diagnosis not present

## 2022-02-28 DIAGNOSIS — M5459 Other low back pain: Secondary | ICD-10-CM | POA: Diagnosis not present

## 2022-02-28 DIAGNOSIS — R262 Difficulty in walking, not elsewhere classified: Secondary | ICD-10-CM | POA: Diagnosis not present

## 2022-02-28 NOTE — Therapy (Addendum)
OUTPATIENT PHYSICAL THERAPY THORACOLUMBAR EVALUATION   Patient Name: Heather Cobb MRN: [id number] DOB:[birth year], 81 y.o., female Today's Date: 03/06/2022   PT End of Session - 02/28/22 1630    Visit Number 1    Number of Visits 16    Date for PT Re-Evaluation 04/26/22    Authorization Type [employer]    PT Start Time 7867    PT Stop Time 1630    PT Time Calculation (min) 45 min    Activity Tolerance Patient tolerated treatment well    Behavior During Therapy [facility] for tasks assessed/performed             Past Medical History:  Diagnosis Date   Anemia    Carotid body tumor ([place])    left   Family history of colon cancer    Hypertension    Hypothyroid    Pyelonephritis    Rectal bleeding 11/26/2015   Vertigo    Vitamin B 12 deficiency    Past Surgical History:  Procedure Laterality Date   ABDOMINAL HYSTERECTOMY     CATARACT EXTRACTION W/ INTRAOCULAR LENS  IMPLANT, BILATERAL     CHOLECYSTECTOMY N/A 04/15/2015   Procedure: LAPAROSCOPIC CHOLECYSTECTOMY;  Surgeon: Aviva Signs, MD;  Location: [facility];  Service: General;  Laterality: N/A;   COLONOSCOPY  2012   [place]   COLONOSCOPY WITH PROPOFOL N/A 02/03/2016   Procedure: COLONOSCOPY WITH PROPOFOL;  Surgeon: Robert Bellow, MD;  Location: [facility];  Service: Endoscopy;  Laterality: N/A;   EYE SURGERY Bilateral    cataract extractions   HEMORRHOID SURGERY N/A 08/31/2018   Procedure: HEMORRHOIDECTOMY;  Surgeon: Robert Bellow, MD;  Location: [facility];  Service: General;  Laterality: N/A;   IR ANGIO EXTERNAL CAROTID SEL EXT CAROTID UNI L MOD SED  10/26/2017   IR ANGIO INTRA EXTRACRAN SEL COM CAROTID INNOMINATE BILAT MOD SED  10/26/2017   IR ANGIO VERTEBRAL SEL VERTEBRAL UNI R MOD SED  10/26/2017   IR ANGIOGRAM EXTREMITY LEFT  10/26/2017   IR ANGIOGRAM FOLLOW UP STUDY  10/26/2017   IR NEURO EACH ADD'L AFTER BASIC UNI LEFT (MS)  10/26/2017   IR RADIOLOGIST EVAL & MGMT  09/21/2017   IR TRANSCATH/EMBOLIZ  10/26/2017   MASS  EXCISION Left 10/27/2017   Procedure: EXCISION CAROTID BODY TUMOR;  Surgeon: Melida Quitter, MD;  Location: [place];  Service: ENT;  Laterality: Left;   RADIOLOGY WITH ANESTHESIA N/A 10/26/2017   Procedure: EMBOLIZATION;  Surgeon: Luanne Bras, MD;  Location: [place];  Service: Radiology;  Laterality: N/A;   SIGMOIDOSCOPY N/A 08/31/2018   Procedure: SIGMOIDOSCOPY-IN  OR;  Surgeon: Robert Bellow, MD;  Location: [facility];  Service: General;  Laterality: N/A;   UPPER GASTROINTESTINAL ENDOSCOPY  2014   VESICOVAGINAL FISTULA CLOSURE W/ TAH     Patient Active Problem List   Diagnosis Date Noted   Localized swelling of right lower leg 02/09/2022   Dermatitis 01/12/2022   Acute right-sided low back pain without sciatica 12/22/2021   Osteopenia 12/22/2021   Viral URI with cough 09/23/2021   UTI (urinary tract infection) 04/07/2021   Hot flashes 01/14/2021   Decreased hearing of left ear 01/14/2021   Acute left ankle pain 06/22/2020   Prediabetes 04/09/2020   Vaginal itching 04/09/2020   Polycystic liver disease 10/24/2019   Coronary atherosclerosis due to calcified coronary lesion of native artery 10/24/2019   Counseling for estrogen replacement therapy 10/15/2019   Chronic cough 10/15/2019   Chronic kidney disease, stage 3a ([place]) 10/15/2019  Genetic testing 02/06/2018   Family history of colon cancer    Carotid body tumor ([place]) 10/26/2017   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 Waunita Schooner   REFERRING PROVIDER: Dr. Waunita Schooner   REFERRING DIAG: M54.41 (ICD-10-CM) - Acute right-sided low back pain with right-sided sciatica  Rationale for Evaluation and Treatment Rehabilitation  THERAPY DIAG:  Other low back pain - Plan: PT plan of care  cert/re-cert  Difficulty in walking, not elsewhere classified - Plan: PT plan of care cert/re-cert  ONSET DATE: 0/30/0923   SUBJECTIVE:                                                                                                                                                                                           SUBJECTIVE STATEMENT: Pt describes that when she was walking she felt a sudden pain that went down right side of back down to leg and even into toes. It mostly hurts when walking but can also hurt when sitting too long.  PERTINENT HISTORY:  02/09/22 Per Dr. Verda Cumins Note   This is a chronic problem. The current episode started in the past 7 days. The pain is present in the lumbar spine and gluteal. The quality of the pain is described as aching. The pain radiates to the right thigh and right knee. The pain is at a severity of 8/10. Exacerbated by: moving around. Pertinent negatives include no bladder incontinence, bowel incontinence, fever, numbness, tingling or weakness.    Steroids on 01/12/2022 - with improvement in pain, did have some increased hunger   Has been doing the home handout of exercises   Did resolve with steroids After about 1 week symptoms returned  PAIN:  Are you having pain? Yes: NPRS scale: 10/10 Pain location: Right buttocks  Pain description: Throbbing  Aggravating factors: Walking and sitting too long  Relieving factors: Tylenol    PRECAUTIONS: None  WEIGHT BEARING RESTRICTIONS No  FALLS:  Has patient fallen in last 6 months? No  LIVING ENVIRONMENT: Lives with: lives with their family and lives with their daughter Lives in: House/apartment Stairs: Yes: Internal: 13 steps; on right going up, on left going up, and can reach both Has following equipment at home:  Stair lift   OCCUPATION: Retired   PLOF: Independent  PATIENT GOALS Pain free or have less pain    OBJECTIVE:  VITALS: BP 139/63 HR 60 SpO2 100  DIAGNOSTIC  FINDINGS:  CLINICAL DATA:  Low back pain, symptoms persist with greater than 6 weeks of treatment. Right-sided pain shooting to the right leg.   EXAM: MRI LUMBAR SPINE WITHOUT CONTRAST   TECHNIQUE: Multiplanar, multisequence MR imaging of the lumbar spine was performed. No intravenous contrast was administered.   COMPARISON:  Radiography 01/12/2022   FINDINGS: Segmentation:  5 lumbar type vertebral bodies.   Alignment:  Normal   Vertebrae: Marrow space lesions affecting T12, L1, L4 and L5, most extensive at L4. I think these most likely represent atypical hemangiomas. However, the possibility of osseous metastatic disease is not completely ruled out.   Conus medullaris and cauda equina: Conus extends to the L1 level. Conus and cauda equina appear normal.   Paraspinal and other soft tissues: Negative   Disc levels:   T11-12 through L1-2: Normal   L2-3: Minimal disc bulge.  Minimal facet hypertrophy.  No stenosis.   L3-4: Mild bulging of the disc. Mild facet hypertrophy. No stenosis.   L4-5: Mild bulging of the disc. Mild facet and ligamentous hypertrophy. No compressive stenosis.   L5-S1: Shallow protrusion of the disc. Mild indentation of the thecal sac. Mild narrowing of the lateral recesses and foramina. Foraminal encroachment is more pronounced on the right, where the right L5 nerve could be affected.   IMPRESSION: L5-S1: Shallow disc protrusion. Mild indentation of the thecal sac with mild stenosis of the subarticular lateral recesses and neural foramina. Foraminal encroachment is more pronounced on the right, where the right L5 nerve could be irritated.   Mild, non-compressive degenerative changes from L2-3 through L4-5 as described above.   Marrow space signal abnormalities at T12, L1, L4 and L5. I think these probably represent atypical hemangiomas. The possibility of metastatic disease does exist but is not favored. Does the patient have any known  malignancy?  PATIENT SURVEYS:  FOTO 59/72  SCREENING FOR RED FLAGS: Bowel or bladder incontinence: No Spinal tumors: No Cauda equina syndrome: No Compression fracture: No Abdominal aneurysm: No  COGNITION:  Overall cognitive status: Within functional limits for tasks assessed     SENSATION: Numbness and tingling on all of her toes   MUSCLE LENGTH: Hamstrings: Right 70 deg; Left 70 deg Thomas test: Right NT deg; Left NT  deg  POSTURE: No Significant postural limitations  PALPATION: Right glute med   LUMBAR ROM:   Active  A/PROM  eval  Flexion 90%  Extension 100%  Right lateral flexion 100%  Left lateral flexion 100%  Right rotation 100%  Left rotation 100%   (Blank rows = not tested)  LOWER EXTREMITY ROM:       Active  Right 02/28/2022 Left 02/28/2022  Hip flexion 120 120  Hip extension 30 30  Hip abduction 45 45  Hip adduction 30 30  Hip internal rotation 45 45  Hip external rotation 45 45  Knee flexion 135 135  Knee extension 0 0  Ankle dorsiflexion 20 20  Ankle plantarflexion 50 50  Ankle inversion 35 35  Ankle eversion 15 15   (Blank rows = not tested)     LOWER EXTREMITY MMT:    MMT Right eval Left eval  Hip flexion 5 5  Hip extension 4 4  Hip abduction 4 4  Hip adduction 4 4  Hip internal rotation    Hip external rotation    Knee flexion 5 5  Knee extension 5 5  Ankle dorsiflexion 5 5  Ankle plantarflexion    Ankle inversion    Ankle eversion     (Blank rows = not tested)  LUMBAR SPECIAL TESTS:  Straight leg raise test: Negative, Slump test: Negative, FABER test: Negative, and Thomas test: Negative FADIR Negative   FUNCTIONAL TESTS:  None   GAIT: Distance walked: 25 ft  Assistive device utilized: None Level of assistance: Complete Independence Comments: No deficits noted     TODAY'S TREATMENT  Prone Quad Stretch 1 x 3 for 60 sec hold  Seated HS Stretch 1 x 3 for 60 sec hold    PATIENT EDUCATION:  Education  details: form and technique for appropriate exercise and explanation of underlying pathology  Person educated: Patient and Child(ren) Education method: Explanation, Demonstration, Verbal cues, and Handouts Education comprehension: verbalized understanding, verbal cues required, and tactile cues required   HOME EXERCISE PROGRAM: Access Code: [email or web address] URL: [email or web address] Date: 02/28/2022 Prepared by: Bradly Chris  Exercises - Prone Quadriceps Stretch with Strap  - 1 x daily - 7 x weekly - 1 sets - 3 reps - 60 hold - Seated Hamstring Stretch  - 1 x daily - 7 x weekly - 1 sets - 3 reps - 60 hold  ASSESSMENT:  CLINICAL IMPRESSION: Patient is a 81 y.o. AA female who was seen today for physical therapy evaluation and treatment for right sided low back pain with sciatic. Pt took pain medications before initial eval so pain response likely not accurate. There was no clear directional preference to symptoms despite radicular symptoms. She demonstrates increased hip weakness and right sided low back pain with ambulation, and she will benefit from skilled PT to resolve these deficits to return to walking and to maintain aerobic fitness.     OBJECTIVE IMPAIRMENTS difficulty walking, decreased strength, and pain.   ACTIVITY LIMITATIONS lifting, bending, and caring for others and walking   PARTICIPATION LIMITATIONS: shopping and community activity  PERSONAL FACTORS Age are also affecting patient's functional outcome.   REHAB POTENTIAL: Good  CLINICAL DECISION MAKING: Stable/uncomplicated  EVALUATION COMPLEXITY: Low   GOALS: Goals reviewed with patient? No  SHORT TERM GOALS: Target date: 03/20/2022  Pt will be independent with HEP in order to improve strength and balance in order to decrease fall risk and improve function at home and work. Baseline: NT  Goal status: INITIAL   LONG TERM GOALS: Target date: 05/01/2022  Patient will have improved function and  activity level as evidenced by an increase in FOTO score by 10 points or more.  Baseline:  Goal status: INITIAL  2.  Patient will improve hip strength by >=1 MMT to decrease stress on surrounding spinal structures for improved symptom response and functional outcomes.  Baseline: Hip Adduction R/L 4/4 Hip Abduction R/L 4/4 Hip Ext R/L 4/4  Goal status: INITIAL  3.  Pt will increase 6MWT by at least 25m(1699f in order to demonstrate clinically significant improvement in cardiopulmonary endurance and community ambulation. Baseline: NT  Goal status: INITIAL  PLAN: PT FREQUENCY: 1-2x/week  PT DURATION: 8 weeks  PLANNED INTERVENTIONS: Therapeutic exercises, Therapeutic activity, Neuromuscular re-education, Balance training, Gait training, Patient/Family education, Joint manipulation, Joint mobilization, Stair training, Dry Needling, Spinal manipulation, Spinal mobilization, Cryotherapy, Moist heat, Traction, Manual therapy, and Re-evaluation.  PLAN FOR NEXT SESSION: 58m44m Finish Hip MMT, Test Abdominal Strength, Progress Hip and Abdominal Strengthening exercises  DanBradly Chris, DPT  03/06/2022, 9:24 PM

## 2022-03-01 ENCOUNTER — Ambulatory Visit (INDEPENDENT_AMBULATORY_CARE_PROVIDER_SITE_OTHER): Payer: Medicare HMO

## 2022-03-01 VITALS — Ht 64.5 in | Wt 151.0 lb

## 2022-03-01 DIAGNOSIS — Z0000 Encounter for general adult medical examination without abnormal findings: Secondary | ICD-10-CM

## 2022-03-01 NOTE — Patient Instructions (Addendum)
Heather Cobb , Thank you for taking time to come for your Medicare Wellness Visit. I appreciate your ongoing commitment to your health goals. Please review the following plan we discussed and let me know if I can assist you in the future.   These are the goals we discussed:  Goals       Patient stated (pt-stated)      I would like to love travel more to see my great grandchildren         This is a list of the screening recommended for you and due dates:  Health Maintenance  Topic Date Due   COVID-19 Vaccine (4 - Pfizer series) 08/31/2020   Zoster (Shingles) Vaccine (1 of 2) 06/01/2022*   Pneumonia Vaccine (2 - PCV) 03/02/2023*   Tetanus Vaccine  03/02/2023*   Flu Shot  04/19/2022   DEXA scan (bone density measurement)  Completed   HPV Vaccine  Aged Out  *Topic was postponed. The date shown is not the original due date.   Advanced directives: No  Conditions/risks identified: None  Next appointment: Follow up in one year for your annual wellness visit     Preventive Care 65 Years and Older, Female Preventive care refers to lifestyle choices and visits with your health care provider that can promote health and wellness. What does preventive care include? A yearly physical exam. This is also called an annual well check. Dental exams once or twice a year. Routine eye exams. Ask your health care provider how often you should have your eyes checked. Personal lifestyle choices, including: Daily care of your teeth and gums. Regular physical activity. Eating a healthy diet. Avoiding tobacco and drug use. Limiting alcohol use. Practicing safe sex. Taking low-dose aspirin every day. Taking vitamin and mineral supplements as recommended by your health care provider. What happens during an annual well check? The services and screenings done by your health care provider during your annual well check will depend on your age, overall health, lifestyle risk factors, and family history  of disease. Counseling  Your health care provider may ask you questions about your: Alcohol use. Tobacco use. Drug use. Emotional well-being. Home and relationship well-being. Sexual activity. Eating habits. History of falls. Memory and ability to understand (cognition). Work and work Statistician. Reproductive health. Screening  You may have the following tests or measurements: Height, weight, and BMI. Blood pressure. Lipid and cholesterol levels. These may be checked every 5 years, or more frequently if you are over 12 years old. Skin check. Lung cancer screening. You may have this screening every year starting at age 76 if you have a 30-pack-year history of smoking and currently smoke or have quit within the past 15 years. Fecal occult blood test (FOBT) of the stool. You may have this test every year starting at age 49. Flexible sigmoidoscopy or colonoscopy. You may have a sigmoidoscopy every 5 years or a colonoscopy every 10 years starting at age 32. Hepatitis C blood test. Hepatitis B blood test. Sexually transmitted disease (STD) testing. Diabetes screening. This is done by checking your blood sugar (glucose) after you have not eaten for a while (fasting). You may have this done every 1-3 years. Bone density scan. This is done to screen for osteoporosis. You may have this done starting at age 36. Mammogram. This may be done every 1-2 years. Talk to your health care provider about how often you should have regular mammograms. Talk with your health care provider about your test results, treatment options,  and if necessary, the need for more tests. Vaccines  Your health care provider may recommend certain vaccines, such as: Influenza vaccine. This is recommended every year. Tetanus, diphtheria, and acellular pertussis (Tdap, Td) vaccine. You may need a Td booster every 10 years. Zoster vaccine. You may need this after age 6. Pneumococcal 13-valent conjugate (PCV13) vaccine. One  dose is recommended after age 47. Pneumococcal polysaccharide (PPSV23) vaccine. One dose is recommended after age 39. Talk to your health care provider about which screenings and vaccines you need and how often you need them. This information is not intended to replace advice given to you by your health care provider. Make sure you discuss any questions you have with your health care provider. Document Released: 10/02/2015 Document Revised: 05/25/2016 Document Reviewed: 07/07/2015 Elsevier Interactive Patient Education  2017 [place] Prevention in the Home Falls can cause injuries. They can happen to people of all ages. There are many things you can do to make your home safe and to help prevent falls. What can I do on the outside of my home? Regularly fix the edges of walkways and driveways and fix any cracks. Remove anything that might make you trip as you walk through a door, such as a raised step or threshold. Trim any bushes or trees on the path to your home. Use bright outdoor lighting. Clear any walking paths of anything that might make someone trip, such as rocks or tools. Regularly check to see if handrails are loose or broken. Make sure that both sides of any steps have handrails. Any raised decks and porches should have guardrails on the edges. Have any leaves, snow, or ice cleared regularly. Use sand or salt on walking paths during winter. Clean up any spills in your garage right away. This includes oil or grease spills. What can I do in the bathroom? Use night lights. Install grab bars by the toilet and in the tub and shower. Do not use towel bars as grab bars. Use non-skid mats or decals in the tub or shower. If you need to sit down in the shower, use a plastic, non-slip stool. Keep the floor dry. Clean up any water that spills on the floor as soon as it happens. Remove soap buildup in the tub or shower regularly. Attach bath mats securely with double-sided  non-slip rug tape. Do not have throw rugs and other things on the floor that can make you trip. What can I do in the bedroom? Use night lights. Make sure that you have a light by your bed that is easy to reach. Do not use any sheets or blankets that are too big for your bed. They should not hang down onto the floor. Have a firm chair that has side arms. You can use this for support while you get dressed. Do not have throw rugs and other things on the floor that can make you trip. What can I do in the kitchen? Clean up any spills right away. Avoid walking on wet floors. Keep items that you use a lot in easy-to-reach places. If you need to reach something above you, use a strong step stool that has a grab bar. Keep electrical cords out of the way. Do not use floor polish or wax that makes floors slippery. If you must use wax, use non-skid floor wax. Do not have throw rugs and other things on the floor that can make you trip. What can I do with my stairs? Do not leave  any items on the stairs. Make sure that there are handrails on both sides of the stairs and use them. Fix handrails that are broken or loose. Make sure that handrails are as long as the stairways. Check any carpeting to make sure that it is firmly attached to the stairs. Fix any carpet that is loose or worn. Avoid having throw rugs at the top or bottom of the stairs. If you do have throw rugs, attach them to the floor with carpet tape. Make sure that you have a light switch at the top of the stairs and the bottom of the stairs. If you do not have them, ask someone to add them for you. What else can I do to help prevent falls? Wear shoes that: Do not have high heels. Have rubber bottoms. Are comfortable and fit you well. Are closed at the toe. Do not wear sandals. If you use a stepladder: Make sure that it is fully opened. Do not climb a closed stepladder. Make sure that both sides of the stepladder are locked into place. Ask  someone to hold it for you, if possible. Clearly mark and make sure that you can see: Any grab bars or handrails. First and last steps. Where the edge of each step is. Use tools that help you move around (mobility aids) if they are needed. These include: Canes. Walkers. Scooters. Crutches. Turn on the lights when you go into a dark area. Replace any light bulbs as soon as they burn out. Set up your furniture so you have a clear path. Avoid moving your furniture around. If any of your floors are uneven, fix them. If there are any pets around you, be aware of where they are. Review your medicines with your doctor. Some medicines can make you feel dizzy. This can increase your chance of falling. Ask your doctor what other things that you can do to help prevent falls. This information is not intended to replace advice given to you by your health care provider. Make sure you discuss any questions you have with your health care provider. Document Released: 07/02/2009 Document Revised: 02/11/2016 Document Reviewed: 10/10/2014 Elsevier Interactive Patient Education  2017 [employer].

## 2022-03-01 NOTE — Progress Notes (Addendum)
Subjective:   Heather Cobb is a 81 y.o. female who presents for Medicare Annual (Subsequent) preventive examination.  Review of Systems    Virtual Visit via Telephone Note  I connected with  Heather Cobb on 03/03/22 at  1:30 PM EDT by telephone and verified that I am speaking with the correct person using two identifiers.  Location: Patient: Home Provider: Office Persons participating in the virtual visit: patient/Nurse Health Advisor   I discussed the limitations, risks, security and privacy concerns of performing an evaluation and management service by telephone and the availability of in person appointments. The patient expressed understanding and agreed to proceed.  Interactive audio and video telecommunications were attempted between this nurse and patient, however failed, due to patient having technical difficulties OR patient did not have access to video capability.  We continued and completed visit with audio only.  Some vital signs may be absent or patient reported.   Heather Peaches, LPN  Cardiac Risk Factors include: advanced age (>56mn, >>57women);hypertension     Objective:    Today's Vitals   03/01/22 1335  Weight: 151 lb (68.5 kg)  Height: 5' 4.5" (1.638 m)   Body mass index is 25.52 kg/m.     03/01/2022    1:49 PM 02/28/2022    3:52 PM 04/07/2021    8:03 AM 03/31/2020   11:14 AM 10/02/2019   10:01 AM 03/05/2019   11:08 AM 08/27/2018   11:56 AM  Advanced Directives  Does Patient Have a Medical Advance Directive? No No No No No No No  Would patient like information on creating a medical advance directive? No - Patient declined No - Patient declined No - Patient declined No - Patient declined No - Patient declined  No - Patient declined    Current Medications (verified) Outpatient Encounter Medications as of 03/01/2022  Medication Sig   acetaminophen (TYLENOL) 325 MG tablet Take 650 mg by mouth every 6 (six) hours as needed.   albuterol (VENTOLIN HFA)  108 (90 Base) MCG/ACT inhaler Inhale 2 puffs into the lungs every 6 (six) hours as needed for wheezing or shortness of breath.   amLODipine (NORVASC) 5 MG tablet Take 5 mg by mouth 2 (two) times daily.   aspirin EC 81 MG tablet Take 81 mg by mouth daily.   benzonatate (TESSALON PERLES) 100 MG capsule Take 1 capsule (100 mg total) by mouth 3 (three) times daily as needed.   cholecalciferol (VITAMIN D) 1000 UNITS tablet Take 1,000 Units by mouth daily.   clotrimazole-betamethasone (LOTRISONE) cream APPLY TOPICALLY TO THE AFFECTED AREA TWICE DAILY   ferrous sulfate 325 (65 FE) MG tablet Take 325 mg by mouth daily with breakfast.   levalbuterol (XOPENEX HFA) 45 MCG/ACT inhaler Inhale 1-2 puffs into the lungs every 4 (four) hours as needed for wheezing.   levothyroxine (SYNTHROID) 75 MCG tablet Take 1 tablet (75 mcg total) by mouth daily before breakfast.   linaclotide (LINZESS) 145 MCG CAPS capsule Take 1 capsule (145 mcg total) by mouth daily as needed (for constipation).   mometasone (NASONEX) 50 MCG/ACT nasal spray Place 2 sprays into the nose daily. (Patient taking differently: Place 2 sprays into the nose as needed.)   olmesartan (BENICAR) 40 MG tablet TAKE 1 TABLET(40 MG) BY MOUTH DAILY   pantoprazole (PROTONIX) 40 MG tablet TAKE 1 TABLET(40 MG) BY MOUTH DAILY   spironolactone (ALDACTONE) 25 MG tablet Take 1 tablet (25 mg total) by mouth daily. For blood pressure.  vitamin B-12 (CYANOCOBALAMIN) 1000 MCG tablet Take 1,000 mcg by mouth daily.    No facility-administered encounter medications on file as of 03/01/2022.    Allergies (verified) Codeine   History: Past Medical History:  Diagnosis Date   Anemia    Carotid body tumor ([place])    left   Family history of colon cancer    Hypertension    Hypothyroid    Pyelonephritis    Rectal bleeding 11/26/2015   Vertigo    Vitamin B 12 deficiency    Past Surgical History:  Procedure Laterality Date   ABDOMINAL HYSTERECTOMY     CATARACT  EXTRACTION W/ INTRAOCULAR LENS  IMPLANT, BILATERAL     CHOLECYSTECTOMY N/A 04/15/2015   Procedure: LAPAROSCOPIC CHOLECYSTECTOMY;  Surgeon: Aviva Signs, MD;  Location: [facility];  Service: General;  Laterality: N/A;   COLONOSCOPY  2012      COLONOSCOPY WITH PROPOFOL N/A 02/03/2016   Procedure: COLONOSCOPY WITH PROPOFOL;  Surgeon: Robert Bellow, MD;  Location: [facility];  Service: Endoscopy;  Laterality: N/A;   EYE SURGERY Bilateral    cataract extractions   HEMORRHOID SURGERY N/A 08/31/2018   Procedure: HEMORRHOIDECTOMY;  Surgeon: Robert Bellow, MD;  Location: [facility];  Service: General;  Laterality: N/A;   IR ANGIO EXTERNAL CAROTID SEL EXT CAROTID UNI L MOD SED  10/26/2017   IR ANGIO INTRA EXTRACRAN SEL COM CAROTID INNOMINATE BILAT MOD SED  10/26/2017   IR ANGIO VERTEBRAL SEL VERTEBRAL UNI R MOD SED  10/26/2017   IR ANGIOGRAM EXTREMITY LEFT  10/26/2017   IR ANGIOGRAM FOLLOW UP STUDY  10/26/2017   IR NEURO EACH ADD'L AFTER BASIC UNI LEFT (MS)  10/26/2017   IR RADIOLOGIST EVAL & MGMT  09/21/2017   IR TRANSCATH/EMBOLIZ  10/26/2017   MASS EXCISION Left 10/27/2017   Procedure: EXCISION CAROTID BODY TUMOR;  Surgeon: Melida Quitter, MD;  Location: [facility];  Service: ENT;  Laterality: Left;   RADIOLOGY WITH ANESTHESIA N/A 10/26/2017   Procedure: EMBOLIZATION;  Surgeon: Luanne Bras, MD;  Location: [facility];  Service: Radiology;  Laterality: N/A;   SIGMOIDOSCOPY N/A 08/31/2018   Procedure: SIGMOIDOSCOPY-IN  OR;  Surgeon: Robert Bellow, MD;  Location: [facility];  Service: General;  Laterality: N/A;   UPPER GASTROINTESTINAL ENDOSCOPY  2014   VESICOVAGINAL FISTULA CLOSURE W/ TAH     Family History  Problem Relation Age of Onset   Asthma Mother    Kidney failure Mother    Hypertension Daughter    Colon cancer Father        dx in his 66s   Stomach cancer Sister        stage 4   Colon cancer Maternal Uncle    Heart attack Maternal Grandmother    Asthma Brother    Cancer Neg Hx     Social History   Socioeconomic History   Marital status: Widowed    Spouse name: Not on file   Number of children: 1   Years of education: high school   Highest education level: Not on file  Occupational History   Occupation: Engineer, manufacturing systems    Comment: retired  Tobacco Use   Smoking status: Never   Smokeless tobacco: Never  Scientific laboratory technician Use: Never used  Substance and Sexual Activity   Alcohol use: No    Alcohol/week: 0.0 standard drinks of alcohol   Drug use: No   Sexual activity: Not Currently    Birth control/protection: Surgical  Other Topics Concern  Not on file  Social History Narrative   10/15/19   From: the area   Living: with Daugher [name]) and son-in-law   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, 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      Family: lives with daughter, has 1 grandson and 3 great-grandchildren and 1 great-great-grandchild      Enjoys: eat, dance, travel, cooking      Exercise: walk - not as much in cold, will do 1 mile/day   Diet: good, tries to eat health      Safety   Seat belts: Yes    Guns: Yes  and secure   Safe in relationships: Yes    Social Determinants of Health   Financial Resource Strain: Low Risk  (03/01/2022)   Overall Financial Resource Strain (CARDIA)    Difficulty of Paying Living Expenses: Not hard at all  Food Insecurity: No Food Insecurity (03/01/2022)   Hunger Vital Sign    Worried About Running Out of Food in the Last Year: Never true    [place] in the Last Year: Never true  Transportation Needs: No Transportation Needs (03/01/2022)   PRAPARE - Hydrologist (Medical): No    Lack of Transportation (Non-Medical): No  Physical Activity: Insufficiently Active (03/01/2022)   Exercise Vital Sign    Days of Exercise per Week: 2 days    Minutes of Exercise per Session: 30 min  Stress: No Stress Concern Present (03/01/2022)   [place]    Feeling of Stress : Not at all  Social Connections: Moderately Integrated (03/01/2022)   Social Connection and Isolation Panel [NHANES]    Frequency of Communication with Friends and Family: More than three times a week    Frequency of Social Gatherings with Friends and Family: Three times a week    Attends Religious Services: More than 4 times per year    Active Member of Clubs or Organizations: Yes    Attends Archivist Meetings: More than 4 times per year    Marital Status: Widowed     Clinical Intake:  Pre-visit preparation completed: No  Pain : No/denies pain     BMI - recorded: 25.55 Nutritional Status: BMI 25 -29 Overweight Nutritional Risks: None Diabetes: No  How often do you need to have someone help you when you read instructions, pamphlets, or other written materials from your doctor or pharmacy?: 3 - Sometimes (Daughter Assist)  Diabetic?  No   Activities of Daily Living    03/01/2022    1:46 PM 02/25/2022    8:21 PM  In your present state of health, do you have any difficulty performing the following activities:  Hearing? 0 0  Vision? 0 0  Difficulty concentrating or making decisions? 0 0  Walking or climbing stairs? 0 0  Dressing or bathing? 0 0  Doing errands, shopping? 0 0  Preparing Food and eating ? N N  Using the Toilet?  N  In the past six months, have you accidently leaked urine? N N  Do you have problems with loss of bowel control? N N  Managing your Medications? N N  Managing your Finances? N N  Housekeeping or managing your Housekeeping? N N    Patient Care Team: Lesleigh Noe, MD as PCP - General (Family Medicine) Bary Castilla, Forest Gleason, MD (General Surgery) Melida Quitter, MD as Consulting Physician (Otolaryngology) Luanne Bras, MD as Consulting Physician (  Interventional Radiology)  Indicate any recent Medical Services you may have received from other than [facility] providers in the past year (date may be  approximate).     Assessment:   This is a routine wellness examination for Heather Cobb.  Hearing/Vision screen Hearing Screening - Comments:: Pending hearing aids  Dietary issues and exercise activities discussed: Exercise limited by: None identified   Goals Addressed               This Visit's Progress     Patient stated (pt-stated)        I would like to love travel more to see my great grandchildren        Depression Screen    03/01/2022    1:43 PM 01/14/2021   10:25 AM 10/15/2019    9:34 AM 11/14/2017    8:21 AM  PHQ 2/9 Scores  PHQ - 2 Score 0 0 0 0    Fall Risk    03/01/2022    1:46 PM 02/25/2022    8:21 PM 01/14/2021    9:24 AM 09/18/2018    8:50 AM 11/14/2017    8:21 AM  [place] in the past year? 0 0 0 0 No  Number falls in past yr: 0 0 0 0   Injury with Fall? 0   0   Risk for fall due to : No Fall Risks        FALL RISK PREVENTION PERTAINING TO THE HOME:  Any stairs in or around the home? Yes  If so, are there any without handrails? No  Home free of loose throw rugs in walkways, pet beds, electrical cords, etc? Yes  Adequate lighting in your home to reduce risk of falls? Yes   ASSISTIVE DEVICES UTILIZED TO PREVENT FALLS:  Life alert? No  Use of a cane, walker or w/c? No  Grab bars in the bathroom? No  Shower chair or bench in shower? No  Elevated toilet seat or a handicapped toilet? No   TIMED UP AND GO:  Was the test performed? No . Audio Visit   Immunizations Immunization History  Administered Date(s) Administered   Fluad Quad(high Dose 65+) 06/04/2020   Influenza, High Dose Seasonal PF 06/19/2019   Influenza-Unspecified 07/02/2021   PFIZER(Purple Top)SARS-COV-2 Vaccination 10/29/2019, 11/19/2019, 07/06/2020   Pneumococcal Polysaccharide-23 06/22/2020    TDAP status: Due, Education has been provided regarding the importance of this vaccine. Advised may receive this vaccine at local pharmacy or Health Dept. Aware to provide a copy  of the vaccination record if obtained from local pharmacy or Health Dept. Verbalized acceptance and understanding.  Flu Vaccine status: Up to date  Pneumococcal vaccine status: Declined,  Education has been provided regarding the importance of this vaccine but patient still declined. Advised may receive this vaccine at local pharmacy or Health Dept. Aware to provide a copy of the vaccination record if obtained from local pharmacy or Health Dept. Verbalized acceptance and understanding.   Covid-19 vaccine status: Completed vaccines  Qualifies for Shingles Vaccine? Yes   Zostavax completed No   Shingrix Completed?: No.    Education has been provided regarding the importance of this vaccine. Patient has been advised to call insurance company to determine out of pocket expense if they have not yet received this vaccine. Advised may also receive vaccine at local pharmacy or Health Dept. Verbalized acceptance and understanding.  Screening Tests Health Maintenance  Topic Date Due   COVID-19 Vaccine (4 - Pfizer series) 08/31/2020  Zoster Vaccines- Shingrix (1 of 2) 06/01/2022 (Originally 11/26/1990)   Pneumonia Vaccine 51+ Years old (2 - PCV) 03/02/2023 (Originally 06/22/2021)   TETANUS/TDAP  03/02/2023 (Originally 11/26/1959)   INFLUENZA VACCINE  04/19/2022   DEXA SCAN  Completed   HPV VACCINES  Aged Out    Health Maintenance  Health Maintenance Due  Topic Date Due   COVID-19 Vaccine (4 - Pfizer series) 08/31/2020    Colorectal cancer screening: No longer required.   Mammogram status: No longer required due to Age.  Bone Density status: Completed 12/15/16. Results reflect: Bone density results: OSTEOPOROSIS. Repeat every   years.  Lung Cancer Screening: (Low Dose CT Chest recommended if Age 107-80 years, 30 pack-year currently smoking OR have quit w/in 15years.) does not qualify.   Additional Screening:  Hepatitis C Screening: does not qualify; Completed   Vision Screening: Recommended  annual ophthalmology exams for early detection of glaucoma and other disorders of the eye. Is the patient up to date with their annual eye exam?  Yes  Who is the provider or what is the name of the office in which the patient attends annual eye exams? Dr Burna Sis If pt is not established with a provider, would they like to be referred to a provider to establish care? No .   Dental Screening: Recommended annual dental exams for proper oral hygiene  Community Resource Referral / Chronic Care Management:  CRR required this visit?  No   CCM required this visit?  No      Plan:     I have personally reviewed and noted the following in the patient's chart:   Medical and social history Use of alcohol, tobacco or illicit drugs  Current medications and supplements including opioid prescriptions.  Functional ability and status Nutritional status Physical activity Advanced directives List of other physicians Hospitalizations, surgeries, and ER visits in previous 12 months Vitals Screenings to include cognitive, depression, and falls Referrals and appointments  In addition, I have reviewed and discussed with patient certain preventive protocols, quality metrics, and best practice recommendations. A written personalized care plan for preventive services as well as general preventive health recommendations were provided to patient.     Heather Peaches, LPN   [id number]/93/[id number]   Nurse Notes: 6CIT completed in encounter with score of 0.

## 2022-03-04 ENCOUNTER — Other Ambulatory Visit (INDEPENDENT_AMBULATORY_CARE_PROVIDER_SITE_OTHER): Payer: Medicare HMO

## 2022-03-04 DIAGNOSIS — Z800 Family history of malignant neoplasm of digestive organs: Secondary | ICD-10-CM | POA: Diagnosis not present

## 2022-03-07 ENCOUNTER — Ambulatory Visit: Payer: Medicare HMO | Admitting: Physical Therapy

## 2022-03-07 ENCOUNTER — Encounter: Payer: Self-pay | Admitting: Physical Therapy

## 2022-03-07 ENCOUNTER — Encounter: Payer: Medicare HMO | Admitting: Physical Therapy

## 2022-03-07 DIAGNOSIS — R262 Difficulty in walking, not elsewhere classified: Secondary | ICD-10-CM | POA: Diagnosis not present

## 2022-03-07 DIAGNOSIS — M5459 Other low back pain: Secondary | ICD-10-CM | POA: Diagnosis not present

## 2022-03-07 DIAGNOSIS — M5441 Lumbago with sciatica, right side: Secondary | ICD-10-CM | POA: Diagnosis not present

## 2022-03-07 LAB — FECAL OCCULT BLOOD, IMMUNOCHEMICAL: Fecal Occult Bld: NEGATIVE

## 2022-03-07 NOTE — Therapy (Signed)
OUTPATIENT PHYSICAL THERAPY TREATMENT NOTE   Patient Name: Heather Cobb MRN: [id number] DOB:1941-01-07, 81 y.o., female Today's Date: 03/07/2022  PCP: Dr. Waunita Schooner REFERRING PROVIDER: Dr. Waunita Schooner   END OF SESSION:   PT End of Session - 03/07/22 0817     Visit Number 2    Number of Visits 16    Date for PT Re-Evaluation 04/26/22    Authorization Type [employer]    PT Start Time 0805    PT Stop Time 0845    PT Time Calculation (min) 40 min    Activity Tolerance Patient tolerated treatment well    Behavior During Therapy [facility] for tasks assessed/performed             Past Medical History:  Diagnosis Date   Anemia    Carotid body tumor ([place])    left   Family history of colon cancer    Hypertension    Hypothyroid    Pyelonephritis    Rectal bleeding 11/26/2015   Vertigo    Vitamin B 12 deficiency    Past Surgical History:  Procedure Laterality Date   ABDOMINAL HYSTERECTOMY     CATARACT EXTRACTION W/ INTRAOCULAR LENS  IMPLANT, BILATERAL     CHOLECYSTECTOMY N/A 04/15/2015   Procedure: LAPAROSCOPIC CHOLECYSTECTOMY;  Surgeon: Aviva Signs, MD;  Location: [facility];  Service: General;  Laterality: N/A;   COLONOSCOPY  2012   [place]   COLONOSCOPY WITH PROPOFOL N/A 02/03/2016   Procedure: COLONOSCOPY WITH PROPOFOL;  Surgeon: Robert Bellow, MD;  Location: [facility];  Service: Endoscopy;  Laterality: N/A;   EYE SURGERY Bilateral    cataract extractions   HEMORRHOID SURGERY N/A 08/31/2018   Procedure: HEMORRHOIDECTOMY;  Surgeon: Robert Bellow, MD;  Location: [facility];  Service: General;  Laterality: N/A;   IR ANGIO EXTERNAL CAROTID SEL EXT CAROTID UNI L MOD SED  10/26/2017   IR ANGIO INTRA EXTRACRAN SEL COM CAROTID INNOMINATE BILAT MOD SED  10/26/2017   IR ANGIO VERTEBRAL SEL VERTEBRAL UNI R MOD SED  10/26/2017   IR ANGIOGRAM EXTREMITY LEFT  10/26/2017   IR ANGIOGRAM FOLLOW UP STUDY  10/26/2017   IR NEURO EACH ADD'L AFTER BASIC UNI LEFT (MS)  10/26/2017   IR  RADIOLOGIST EVAL & MGMT  09/21/2017   IR TRANSCATH/EMBOLIZ  10/26/2017   MASS EXCISION Left 10/27/2017   Procedure: EXCISION CAROTID BODY TUMOR;  Surgeon: Melida Quitter, MD;  Location: [place];  Service: ENT;  Laterality: Left;   RADIOLOGY WITH ANESTHESIA N/A 10/26/2017   Procedure: EMBOLIZATION;  Surgeon: Luanne Bras, MD;  Location: [place];  Service: Radiology;  Laterality: N/A;   SIGMOIDOSCOPY N/A 08/31/2018   Procedure: SIGMOIDOSCOPY-IN  OR;  Surgeon: Robert Bellow, MD;  Location: [facility];  Service: General;  Laterality: N/A;   UPPER GASTROINTESTINAL ENDOSCOPY  2014   VESICOVAGINAL FISTULA CLOSURE W/ TAH     Patient Active Problem List   Diagnosis Date Noted   Localized swelling of right lower leg 02/09/2022   Dermatitis 01/12/2022   Acute right-sided low back pain without sciatica 12/22/2021   Osteopenia 12/22/2021   Viral URI with cough 09/23/2021   UTI (urinary tract infection) 04/07/2021   Hot flashes 01/14/2021   Decreased hearing of left ear 01/14/2021   Acute left ankle pain 06/22/2020   Prediabetes 04/09/2020   Vaginal itching 04/09/2020   Polycystic liver disease 10/24/2019   Coronary atherosclerosis due to calcified coronary lesion of native artery 10/24/2019   Counseling for estrogen replacement  therapy 10/15/2019   Chronic cough 10/15/2019   Chronic kidney disease, stage 3a ([place]) 10/15/2019   Genetic testing 02/06/2018   Family history of colon cancer    Carotid body tumor ([place]) 10/26/2017   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 Verda Cumins Note    This is a chronic problem. The current episode started in the past 7 days. The pain is present in the lumbar spine and gluteal. The quality of the pain is described as aching. The pain radiates to the right thigh and right knee. The pain is at a severity of 8/10. Exacerbated by: moving around. Pertinent negatives include no bladder incontinence, bowel incontinence, fever, numbness, tingling or weakness.    Steroids on 01/12/2022 - with improvement in pain, did have some increased hunger   Has been doing the home handout of exercises   Did resolve with steroids After about 1 week symptoms returned  PRECAUTIONS: None   SUBJECTIVE: Pt reports that she has not felt an increase in her low back pain since last session. She has been unable to do exercises because of how busy she was over the weekend.   PAIN:  Are you having pain? Yes: NPRS scale: 3/10 Pain location: Right sided low back pain that radiates down right leg  Pain description: Achy  Aggravating factors: Walking long distances  Relieving factors: Tylenol arthritis    OBJECTIVE:                VITALS: BP 139/63 HR 60 SpO2 100   DIAGNOSTIC FINDINGS:  CLINICAL DATA:  Low back pain, symptoms persist with greater than 6 weeks of treatment. Right-sided pain shooting to the right leg.   EXAM: MRI LUMBAR SPINE WITHOUT CONTRAST   TECHNIQUE: Multiplanar, multisequence MR imaging of the lumbar spine was performed. No intravenous contrast was administered.   COMPARISON:  Radiography 01/12/2022   FINDINGS: Segmentation:  5 lumbar type vertebral bodies.   Alignment:  Normal   Vertebrae: Marrow space lesions affecting T12, L1, L4 and L5, most extensive at L4. I think these most likely represent atypical hemangiomas. However, the possibility of osseous metastatic disease is not completely ruled out.   Conus medullaris and cauda equina: Conus extends to  the L1 level. Conus and cauda equina appear normal.   Paraspinal and other soft tissues: Negative   Disc levels:   T11-12 through L1-2: Normal   L2-3: Minimal disc bulge.  Minimal facet hypertrophy.  No stenosis.   L3-4: Mild bulging of the disc. Mild facet hypertrophy. No stenosis.   L4-5: Mild bulging of the disc. Mild facet and ligamentous hypertrophy. No compressive stenosis.   L5-S1: Shallow protrusion of the disc. Mild indentation of the thecal sac. Mild narrowing of the lateral recesses and foramina. Foraminal encroachment is more pronounced on the right, where the right L5 nerve could be affected.   IMPRESSION: L5-S1: Shallow disc protrusion. Mild indentation of the thecal sac with mild stenosis of the subarticular lateral recesses and neural foramina. Foraminal encroachment is more pronounced on the right, where the right L5 nerve  could be irritated.   Mild, non-compressive degenerative changes from L2-3 through L4-5 as described above.   Marrow space signal abnormalities at T12, L1, L4 and L5. I think these probably represent atypical hemangiomas. The possibility of metastatic disease does exist but is not favored. Does the patient have any known malignancy?   PATIENT SURVEYS:  FOTO 59/72   SCREENING FOR RED FLAGS: Bowel or bladder incontinence: No Spinal tumors: No Cauda equina syndrome: No Compression fracture: No Abdominal aneurysm: No   COGNITION:           Overall cognitive status: Within functional limits for tasks assessed                          SENSATION: Numbness and tingling on all of her toes    MUSCLE LENGTH: Hamstrings: Right 70 deg; Left 70 deg Thomas test: Right NT deg; Left NT  deg   POSTURE: No Significant postural limitations   PALPATION: Right glute med    LUMBAR ROM:    Active  A/PROM  eval  Flexion 90%  Extension 100%  Right lateral flexion 100%  Left lateral flexion 100%  Right rotation 100%  Left rotation 100%    (Blank rows = not tested)   LOWER EXTREMITY ROM:          Active  Right 02/28/2022 Left 02/28/2022  Hip flexion 120 120  Hip extension 30 30  Hip abduction 45 45  Hip adduction 30 30  Hip internal rotation 45 45  Hip external rotation 45 45  Knee flexion 135 135  Knee extension 0 0  Ankle dorsiflexion 20 20  Ankle plantarflexion 50 50  Ankle inversion 35 35  Ankle eversion 15 15   (Blank rows = not tested)        LOWER EXTREMITY MMT:     MMT Right eval Left eval 03/07/22 03/07/22  Hip flexion 5 5    Hip extension 4 4    Hip abduction 4 4    Hip adduction 4 4    Hip internal rotation     5 5  Hip external rotation     5 5  Knee flexion 5 5    Knee extension 5 5    Ankle dorsiflexion 5 5    Ankle plantarflexion        Ankle inversion        Ankle eversion         (Blank rows = not tested)   LUMBAR SPECIAL TESTS:  Straight leg raise test: Negative, Slump test: Negative, FABER test: Negative, and Thomas test: Negative FADIR Negative    FUNCTIONAL TESTS:  None    GAIT: Distance walked: 25 ft  Assistive device utilized: None Level of assistance: Complete Independence Comments: No deficits noted        TODAY'S TREATMENT  03/07/22            Nu-Step            Supine Bridges 3 x 10            Figure 4 Bridges 3 x 10            82mT:  1,500 ft            -Experienced symptoms after 1,300 ft   Initial  Prone Quad Stretch 1 x 3 for 60 sec hold  Seated HS Stretch 1 x 3 for 60 sec hold  PATIENT EDUCATION:  Education details: form and technique for appropriate exercise and explanation of underlying pathology  Person educated: Patient and Child(ren) Education method: Explanation, Demonstration, Verbal cues, and Handouts Education comprehension: verbalized understanding, verbal cues required, and tactile cues required     HOME EXERCISE PROGRAM: Access Code: [email or web address] URL: [email or web address] Date: 03/07/2022 Prepared by: Bradly Chris  Exercises - Prone Quadriceps Stretch with Strap  - 1 x daily - 7 x weekly - 1 sets - 3 reps - 60 hold - Seated Hamstring Stretch  - 1 x daily - 7 x weekly - 1 sets - 3 reps - 60 hold - Figure 4 Bridge  - 1 x daily - 3 x weekly - 3 sets - 10 reps - Clamshell with Resistance  - 1 x daily - 3 x weekly - 3 sets - 10 reps   ASSESSMENT:   CLINICAL IMPRESSION: Pt exhibits limited pain and symptom response with activity. She requires mod VC and TC to accurately complete exercises. Symptoms do not limit her aerobic capacity and she only experiences these symptoms after completing community level distance. She continues not show a directional preference. Patient will benefit from skilled PT to resolve these deficits to return to walking and to maintain aerobic fitness.   OBJECTIVE IMPAIRMENTS difficulty walking, decreased strength, and pain.    ACTIVITY LIMITATIONS lifting, bending, and caring for others and walking    PARTICIPATION LIMITATIONS: shopping and community activity   PERSONAL FACTORS Age are also affecting patient's functional outcome.    REHAB POTENTIAL: Good   CLINICAL DECISION MAKING: Stable/uncomplicated   EVALUATION COMPLEXITY: Low     GOALS: Goals reviewed with patient? No   SHORT TERM GOALS: Target date: 03/20/2022   Pt will be independent with HEP in order to improve strength and balance in order to decrease fall risk and improve function at home and work. Baseline: NT  Goal status: INITIAL     LONG TERM GOALS: Target date: 05/01/2022   Patient will have improved function and activity level as evidenced by an increase in FOTO score by 10 points or more.  Baseline: 59/100 Goal status: INITIAL   2.  Patient will improve hip strength by >=1 MMT to decrease stress on surrounding spinal structures for improved symptom response and functional outcomes.  Baseline: Hip Adduction R/L 4/4 Hip Abduction R/L 4/4 Hip Ext R/L 4/4  Goal status: INITIAL   3.  Pt will  increase 6MWT by at least 23m([id number] in order to demonstrate clinically significant improvement in cardiopulmonary endurance and community ambulation. Baseline:  03/07/22: 1,700 Goal status: Deferred   PLAN: PT FREQUENCY: 1-2x/week   PT DURATION: 8 weeks   PLANNED INTERVENTIONS: Therapeutic exercises, Therapeutic activity, Neuromuscular re-education, Balance training, Gait training, Patient/Family education, Joint manipulation, Joint mobilization, Stair training, Dry Needling, Spinal manipulation, Spinal mobilization, Cryotherapy, Moist heat, Traction, Manual therapy, and Re-evaluation.   PLAN FOR NEXT SESSION:  Test Abdominal Strength, Progress Hip and Abdominal Strengthening exercises   DaBradly ChrisT, DPT  03/07/2022, 8:56 AM

## 2022-03-14 ENCOUNTER — Ambulatory Visit: Payer: Medicare HMO | Admitting: Physical Therapy

## 2022-03-15 ENCOUNTER — Encounter: Payer: Self-pay | Admitting: Physical Therapy

## 2022-03-15 ENCOUNTER — Ambulatory Visit: Payer: Medicare HMO | Admitting: Physical Therapy

## 2022-03-15 DIAGNOSIS — M5459 Other low back pain: Secondary | ICD-10-CM

## 2022-03-15 DIAGNOSIS — R262 Difficulty in walking, not elsewhere classified: Secondary | ICD-10-CM

## 2022-03-15 DIAGNOSIS — M5441 Lumbago with sciatica, right side: Secondary | ICD-10-CM | POA: Diagnosis not present

## 2022-03-15 NOTE — Therapy (Signed)
OUTPATIENT PHYSICAL THERAPY TREATMENT NOTE   Patient Name: Heather Cobb MRN: [id number] DOB:January 12, 1941, 81 y.o., female Today's Date: 03/15/2022  PCP: Dr. Gweneth Dimitri REFERRING PROVIDER: Dr. Gweneth Dimitri   END OF SESSION:   PT End of Session - 03/15/22 1548     Visit Number 3    Number of Visits 16    Date for PT Re-Evaluation 04/26/22    Authorization Type [employer]    PT Start Time 1545    PT Stop Time 1630    PT Time Calculation (min) 45 min    Activity Tolerance Patient tolerated treatment well    Behavior During Therapy [facility] for tasks assessed/performed             Past Medical History:  Diagnosis Date   Anemia    Carotid body tumor (HCC)    left   Family history of colon cancer    Hypertension    Hypothyroid    Pyelonephritis    Rectal bleeding 11/26/2015   Vertigo    Vitamin B 12 deficiency    Past Surgical History:  Procedure Laterality Date   ABDOMINAL HYSTERECTOMY     CATARACT EXTRACTION W/ INTRAOCULAR LENS  IMPLANT, BILATERAL     CHOLECYSTECTOMY N/A 04/15/2015   Procedure: LAPAROSCOPIC CHOLECYSTECTOMY;  Surgeon: Franky Macho, MD;  Location: [facility];  Service: General;  Laterality: N/A;   COLONOSCOPY  2012   [place]   COLONOSCOPY WITH PROPOFOL N/A 02/03/2016   Procedure: COLONOSCOPY WITH PROPOFOL;  Surgeon: Earline Mayotte, MD;  Location: [facility];  Service: Endoscopy;  Laterality: N/A;   EYE SURGERY Bilateral    cataract extractions   HEMORRHOID SURGERY N/A 08/31/2018   Procedure: HEMORRHOIDECTOMY;  Surgeon: Earline Mayotte, MD;  Location: [facility];  Service: General;  Laterality: N/A;   IR ANGIO EXTERNAL CAROTID SEL EXT CAROTID UNI L MOD SED  10/26/2017   IR ANGIO INTRA EXTRACRAN SEL COM CAROTID INNOMINATE BILAT MOD SED  10/26/2017   IR ANGIO VERTEBRAL SEL VERTEBRAL UNI R MOD SED  10/26/2017   IR ANGIOGRAM EXTREMITY LEFT  10/26/2017   IR ANGIOGRAM FOLLOW UP STUDY  10/26/2017   IR NEURO EACH ADD'L AFTER BASIC UNI LEFT (MS)  10/26/2017   IR  RADIOLOGIST EVAL & MGMT  09/21/2017   IR TRANSCATH/EMBOLIZ  10/26/2017   MASS EXCISION Left 10/27/2017   Procedure: EXCISION CAROTID BODY TUMOR;  Surgeon: Christia Reading, MD;  Location: [facility] OR;  Service: ENT;  Laterality: Left;   RADIOLOGY WITH ANESTHESIA N/A 10/26/2017   Procedure: EMBOLIZATION;  Surgeon: Julieanne Cotton, MD;  Location: [facility] OR;  Service: Radiology;  Laterality: N/A;   SIGMOIDOSCOPY N/A 08/31/2018   Procedure: SIGMOIDOSCOPY-IN  OR;  Surgeon: Earline Mayotte, MD;  Location: [facility];  Service: General;  Laterality: N/A;   UPPER GASTROINTESTINAL ENDOSCOPY  2014   VESICOVAGINAL FISTULA CLOSURE W/ TAH     Patient Active Problem List   Diagnosis Date Noted   Localized swelling of right lower leg 02/09/2022   Dermatitis 01/12/2022   Acute right-sided low back pain without sciatica 12/22/2021   Osteopenia 12/22/2021   Viral URI with cough 09/23/2021   UTI (urinary tract infection) 04/07/2021   Hot flashes 01/14/2021   Decreased hearing of left ear 01/14/2021   Acute left ankle pain 06/22/2020   Prediabetes 04/09/2020   Vaginal itching 04/09/2020   Polycystic liver disease 10/24/2019   Coronary atherosclerosis due to calcified coronary lesion of native artery 10/24/2019   Counseling for estrogen replacement  therapy 10/15/2019   Chronic cough 10/15/2019   Chronic kidney disease, stage 3a (HCC) 10/15/2019   Genetic testing 02/06/2018   Family history of colon cancer    Carotid body tumor (HCC) 10/26/2017   Paraganglioma (HCC) 08/21/2017   Constipation 11/26/2015   Anemia, iron deficiency 02/03/2013   Vitamin B12 deficiency 02/03/2013   Essential hypertension, benign 02/02/2013   Anemia 02/02/2013   Dehydration 02/02/2013   Hypothyroidism 02/02/2013    REFERRING DIAG: M54.41 (ICD-10-CM) - Acute right-sided low back pain with right-sided sciatica  THERAPY DIAG:  Other low back pain  Difficulty in walking, not elsewhere classified  Rationale for Evaluation and  Treatment Rehabilitation  PERTINENT HISTORY: 02/09/22 Per Dr. Elmyra Ricks Note    This is a chronic problem. The current episode started in the past 7 days. The pain is present in the lumbar spine and gluteal. The quality of the pain is described as aching. The pain radiates to the right thigh and right knee. The pain is at a severity of 8/10. Exacerbated by: moving around. Pertinent negatives include no bladder incontinence, bowel incontinence, fever, numbness, tingling or weakness.    Steroids on 01/12/2022 - with improvement in pain, did have some increased hunger   Has been doing the home handout of exercises   Did resolve with steroids After about 1 week symptoms returned  PRECAUTIONS: None   SUBJECTIVE: Pt reports that she continues to have a low amount of back pain with the exception of walking for long periods of time. She did have some difficulty with the side lying hip abduction exercise and forgot how to correctly perform it.   PAIN:  Are you having pain? Yes: NPRS scale: 3/10 Pain location: Right sided low back pain that radiates down right leg  Pain description: Achy  Aggravating factors: Walking long distances  Relieving factors: Tylenol arthritis    OBJECTIVE:                VITALS: BP 139/63 HR 60 SpO2 100   DIAGNOSTIC FINDINGS:  CLINICAL DATA:  Low back pain, symptoms persist with greater than 6 weeks of treatment. Right-sided pain shooting to the right leg.   EXAM: MRI LUMBAR SPINE WITHOUT CONTRAST   TECHNIQUE: Multiplanar, multisequence MR imaging of the lumbar spine was performed. No intravenous contrast was administered.   COMPARISON:  Radiography 01/12/2022   FINDINGS: Segmentation:  5 lumbar type vertebral bodies.   Alignment:  Normal   Vertebrae: Marrow space lesions affecting T12, L1, L4 and L5, most extensive at L4. I think these most likely represent atypical hemangiomas. However, the possibility of osseous metastatic disease is not completely  ruled out.   Conus medullaris and cauda equina: Conus extends to the L1 level. Conus and cauda equina appear normal.   Paraspinal and other soft tissues: Negative   Disc levels:   T11-12 through L1-2: Normal   L2-3: Minimal disc bulge.  Minimal facet hypertrophy.  No stenosis.   L3-4: Mild bulging of the disc. Mild facet hypertrophy. No stenosis.   L4-5: Mild bulging of the disc. Mild facet and ligamentous hypertrophy. No compressive stenosis.   L5-S1: Shallow protrusion of the disc. Mild indentation of the thecal sac. Mild narrowing of the lateral recesses and foramina. Foraminal encroachment is more pronounced on the right, where the right L5 nerve could be affected.   IMPRESSION: L5-S1: Shallow disc protrusion. Mild indentation of the thecal sac with mild stenosis of the subarticular lateral recesses and neural foramina. Foraminal encroachment is more  pronounced on the right, where the right L5 nerve could be irritated.   Mild, non-compressive degenerative changes from L2-3 through L4-5 as described above.   Marrow space signal abnormalities at T12, L1, L4 and L5. I think these probably represent atypical hemangiomas. The possibility of metastatic disease does exist but is not favored. Does the patient have any known malignancy?   PATIENT SURVEYS:  FOTO 59/72   SCREENING FOR RED FLAGS: Bowel or bladder incontinence: No Spinal tumors: No Cauda equina syndrome: No Compression fracture: No Abdominal aneurysm: No   COGNITION:           Overall cognitive status: Within functional limits for tasks assessed                          SENSATION: Numbness and tingling on all of her toes    MUSCLE LENGTH: Hamstrings: Right 70 deg; Left 70 deg Thomas test: Right NT deg; Left NT  deg   POSTURE: No Significant postural limitations   PALPATION: Right glute med    LUMBAR ROM:    Active  A/PROM  eval  Flexion 90%  Extension 100%  Right lateral flexion 100%  Left  lateral flexion 100%  Right rotation 100%  Left rotation 100%   (Blank rows = not tested)   LOWER EXTREMITY ROM:          Active  Right 02/28/2022 Left 02/28/2022  Hip flexion 120 120  Hip extension 30 30  Hip abduction 45 45  Hip adduction 30 30  Hip internal rotation 45 45  Hip external rotation 45 45  Knee flexion 135 135  Knee extension 0 0  Ankle dorsiflexion 20 20  Ankle plantarflexion 50 50  Ankle inversion 35 35  Ankle eversion 15 15   (Blank rows = not tested)        LOWER EXTREMITY MMT:     MMT Right eval Left eval 03/07/22 03/07/22  Hip flexion 5 5    Hip extension 4 4    Hip abduction 4 4    Hip adduction 4 4    Hip internal rotation     5 5  Hip external rotation     5 5  Knee flexion 5 5    Knee extension 5 5    Ankle dorsiflexion 5 5    Ankle plantarflexion        Ankle inversion        Ankle eversion         (Blank rows = not tested)   LUMBAR SPECIAL TESTS:  Straight leg raise test: Negative, Slump test: Negative, FABER test: Negative, and Thomas test: Negative FADIR Negative    FUNCTIONAL TESTS:  None    GAIT: Distance walked: 25 ft  Assistive device utilized: None Level of assistance: Complete Independence Comments: No deficits noted        TODAY'S TREATMENT   03/15/22           Nu-Step Seat and Arms 7 and resistance at 3 for 5 min                     Hook Lying  Abdominal Reaches 1 x 10            Hook Lying Crunches with hands behind head 3 x 10            - min VC to lift shoulder blades off of the mat  Side Lying Hip Abduction with Red TB 3 x 10             Prone Quad Stretch 4 x 30 sec                        03/07/22            Nu-Step            Supine Bridges 3 x 10            Figure 4 Bridges 3 x 10            :  1,500 ft            -Experienced symptoms after 1,300 ft   Initial  Prone Quad Stretch 1 x 3 for 60 sec hold  Seated HS Stretch 1 x 3 for 60 sec hold      PATIENT EDUCATION:  Education  details: form and technique for appropriate exercise and explanation of underlying pathology  Person educated: Patient and Child(ren) Education method: Explanation, Demonstration, Verbal cues, and Handouts Education comprehension: verbalized understanding, verbal cues required, and tactile cues required     HOME EXERCISE PROGRAM: Access Code: [email or web address] URL: [email or web address] Date: 03/15/2022 Prepared by: Ellin Goodie  Exercises - Prone Quadriceps Stretch with Strap  - 1 x daily - 7 x weekly - 1 sets - 3 reps - 60 hold - Figure 4 Bridge  - 1 x daily - 3 x weekly - 3 sets - 10 reps - Ab Prep  - 1 x daily - 3 x weekly - 3 sets - 10 reps - Clamshell with Resistance  - 1 x daily - 3 x weekly - 3 sets - 10 reps - Seated Hip Adduction Isometrics with Ball  - 1 x daily - 3 x weekly - 3 sets - 10 reps - 5 hold - Seated Table Hamstring Stretch  - 1 x daily - 7 x weekly - 1 sets - 3 reps - 60 hold ASSESSMENT:   CLINICAL IMPRESSION:  Pt continues to experience no increase in pain with exercises. Reviewed HEP for pt to complete with improved accuracy. Daughter present to video instruction for increased accuracy. Will assess hip strength next session to determine progress.  Patient will continue to benefit from skilled PT to resolve these deficits to return to walking and to maintain aerobic fitness.     OBJECTIVE IMPAIRMENTS difficulty walking, decreased strength, and pain.    ACTIVITY LIMITATIONS lifting, bending, and caring for others and walking    PARTICIPATION LIMITATIONS: shopping and community activity   PERSONAL FACTORS Age are also affecting patient's functional outcome.    REHAB POTENTIAL: Good   CLINICAL DECISION MAKING: Stable/uncomplicated   EVALUATION COMPLEXITY: Low     GOALS: Goals reviewed with patient? No   SHORT TERM GOALS: Target date: 03/20/2022   Pt will be independent with HEP in order to improve strength and balance in order to decrease fall  risk and improve function at home and work. Baseline: NT  Goal status: INITIAL     LONG TERM GOALS: Target date: 05/01/2022   Patient will have improved function and activity level as evidenced by an increase in FOTO score by 10 points or more.  Baseline: 59/100 Goal status: INITIAL   2.  Patient will improve hip strength by >=1 MMT to decrease stress on surrounding spinal structures for improved symptom response and functional outcomes.  Baseline: Hip Adduction R/L 4/4 Hip  Abduction R/L 4/4 Hip Ext R/L 4/4  Goal status: INITIAL   3.  Pt will increase by at least 48m (13ft) in order to demonstrate clinically significant improvement in cardiopulmonary endurance and community ambulation. Baseline:  03/07/22: 1,700 Goal status: Deferred   PLAN: PT FREQUENCY: 1-2x/week   PT DURATION: 8 weeks   PLANNED INTERVENTIONS: Therapeutic exercises, Therapeutic activity, Neuromuscular re-education, Balance training, Gait training, Patient/Family education, Joint manipulation, Joint mobilization, Stair training, Dry Needling, Spinal manipulation, Spinal mobilization, Cryotherapy, Moist heat, Traction, Manual therapy, and Re-evaluation.   PLAN FOR NEXT SESSION:  FOTO,Test MMT, Progress Hip and Abdominal Strengthening exercises   Ellin Goodie PT, DPT  03/15/2022, 4:31 PM

## 2022-03-23 ENCOUNTER — Encounter: Payer: Self-pay | Admitting: Physical Therapy

## 2022-03-23 ENCOUNTER — Ambulatory Visit: Payer: Medicare HMO | Attending: Family Medicine | Admitting: Physical Therapy

## 2022-03-23 DIAGNOSIS — R262 Difficulty in walking, not elsewhere classified: Secondary | ICD-10-CM | POA: Insufficient documentation

## 2022-03-23 DIAGNOSIS — M5459 Other low back pain: Secondary | ICD-10-CM | POA: Diagnosis not present

## 2022-03-23 NOTE — Therapy (Signed)
OUTPATIENT PHYSICAL THERAPY TREATMENT NOTE   Patient Name: Heather Cobb MRN: [id number] DOB:11/01/1940, 81 y.o., female Today's Date: 03/23/2022  PCP: Dr. Waunita Schooner REFERRING PROVIDER: Dr. Waunita Schooner   END OF SESSION:   PT End of Session - 03/23/22 1552     Visit Number 3    Number of Visits 16    Date for PT Re-Evaluation 04/26/22    Authorization Type [employer]    PT Start Time 6599    PT Stop Time 1630    PT Time Calculation (min) 40 min    Activity Tolerance Patient tolerated treatment well    Behavior During Therapy [facility] for tasks assessed/performed             Past Medical History:  Diagnosis Date   Anemia    Carotid body tumor ([place])    left   Family history of colon cancer    Hypertension    Hypothyroid    Pyelonephritis    Rectal bleeding 11/26/2015   Vertigo    Vitamin B 12 deficiency    Past Surgical History:  Procedure Laterality Date   ABDOMINAL HYSTERECTOMY     CATARACT EXTRACTION W/ INTRAOCULAR LENS  IMPLANT, BILATERAL     CHOLECYSTECTOMY N/A 04/15/2015   Procedure: LAPAROSCOPIC CHOLECYSTECTOMY;  Surgeon: Aviva Signs, MD;  Location: [facility];  Service: General;  Laterality: N/A;   COLONOSCOPY  2012   [place]   COLONOSCOPY WITH PROPOFOL N/A 02/03/2016   Procedure: COLONOSCOPY WITH PROPOFOL;  Surgeon: Robert Bellow, MD;  Location: [facility];  Service: Endoscopy;  Laterality: N/A;   EYE SURGERY Bilateral    cataract extractions   HEMORRHOID SURGERY N/A 08/31/2018   Procedure: HEMORRHOIDECTOMY;  Surgeon: Robert Bellow, MD;  Location: [facility];  Service: General;  Laterality: N/A;   IR ANGIO EXTERNAL CAROTID SEL EXT CAROTID UNI L MOD SED  10/26/2017   IR ANGIO INTRA EXTRACRAN SEL COM CAROTID INNOMINATE BILAT MOD SED  10/26/2017   IR ANGIO VERTEBRAL SEL VERTEBRAL UNI R MOD SED  10/26/2017   IR ANGIOGRAM EXTREMITY LEFT  10/26/2017   IR ANGIOGRAM FOLLOW UP STUDY  10/26/2017   IR NEURO EACH ADD'L AFTER BASIC UNI LEFT (MS)  10/26/2017   IR  RADIOLOGIST EVAL & MGMT  09/21/2017   IR TRANSCATH/EMBOLIZ  10/26/2017   MASS EXCISION Left 10/27/2017   Procedure: EXCISION CAROTID BODY TUMOR;  Surgeon: Melida Quitter, MD;  Location: [place];  Service: ENT;  Laterality: Left;   RADIOLOGY WITH ANESTHESIA N/A 10/26/2017   Procedure: EMBOLIZATION;  Surgeon: Luanne Bras, MD;  Location: [place];  Service: Radiology;  Laterality: N/A;   SIGMOIDOSCOPY N/A 08/31/2018   Procedure: SIGMOIDOSCOPY-IN  OR;  Surgeon: Robert Bellow, MD;  Location: [facility];  Service: General;  Laterality: N/A;   UPPER GASTROINTESTINAL ENDOSCOPY  2014   VESICOVAGINAL FISTULA CLOSURE W/ TAH     Patient Active Problem List   Diagnosis Date Noted   Localized swelling of right lower leg 02/09/2022   Dermatitis 01/12/2022   Acute right-sided low back pain without sciatica 12/22/2021   Osteopenia 12/22/2021   Viral URI with cough 09/23/2021   UTI (urinary tract infection) 04/07/2021   Hot flashes 01/14/2021   Decreased hearing of left ear 01/14/2021   Acute left ankle pain 06/22/2020   Prediabetes 04/09/2020   Vaginal itching 04/09/2020   Polycystic liver disease 10/24/2019   Coronary atherosclerosis due to calcified coronary lesion of native artery 10/24/2019   Counseling for estrogen replacement  therapy 10/15/2019   Chronic cough 10/15/2019   Chronic kidney disease, stage 3a ([place]) 10/15/2019   Genetic testing 02/06/2018   Family history of colon cancer    Carotid body tumor ([place]) 10/26/2017   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 Verda Cumins Note    This is a chronic problem. The current episode started in the past 7 days. The pain is present in the lumbar spine and gluteal. The quality of the pain is described as aching. The pain radiates to the right thigh and right knee. The pain is at a severity of 8/10. Exacerbated by: moving around. Pertinent negatives include no bladder incontinence, bowel incontinence, fever, numbness, tingling or weakness.    Steroids on 01/12/2022 - with improvement in pain, did have some increased hunger   Has been doing the home handout of exercises   Did resolve with steroids After about 1 week symptoms returned  PRECAUTIONS: None   SUBJECTIVE: Pt states that she has increased knee pain after standing for long periods of time to cook yesterday for July 4th festivities.   PAIN:  Are you having pain? Yes: NPRS scale: 3/10 Pain location: Right sided low back pain that radiates down right leg  Pain description: Achy  Aggravating factors: Walking long distances  Relieving factors: Tylenol arthritis    OBJECTIVE:                VITALS: BP 139/63 HR 60 SpO2 100   DIAGNOSTIC FINDINGS:  CLINICAL DATA:  Low back pain, symptoms persist with greater than 6 weeks of treatment. Right-sided pain shooting to the right leg.   EXAM: MRI LUMBAR SPINE WITHOUT CONTRAST   TECHNIQUE: Multiplanar, multisequence MR imaging of the lumbar spine was performed. No intravenous contrast was administered.   COMPARISON:  Radiography 01/12/2022   FINDINGS: Segmentation:  5 lumbar type vertebral bodies.   Alignment:  Normal   Vertebrae: Marrow space lesions affecting T12, L1, L4 and L5, most extensive at L4. I think these most likely represent atypical hemangiomas. However, the possibility of osseous metastatic disease is not completely ruled out.   Conus medullaris and cauda equina: Conus extends to the L1 level. Conus and cauda equina  appear normal.   Paraspinal and other soft tissues: Negative   Disc levels:   T11-12 through L1-2: Normal   L2-3: Minimal disc bulge.  Minimal facet hypertrophy.  No stenosis.   L3-4: Mild bulging of the disc. Mild facet hypertrophy. No stenosis.   L4-5: Mild bulging of the disc. Mild facet and ligamentous hypertrophy. No compressive stenosis.   L5-S1: Shallow protrusion of the disc. Mild indentation of the thecal sac. Mild narrowing of the lateral recesses and foramina. Foraminal encroachment is more pronounced on the right, where the right L5 nerve could be affected.   IMPRESSION: L5-S1: Shallow disc protrusion. Mild indentation of the thecal sac with mild stenosis of the subarticular lateral recesses and neural foramina. Foraminal encroachment is more pronounced on the right, where the right L5 nerve could be irritated.   Mild, non-compressive degenerative changes from L2-3  through L4-5 as described above.   Marrow space signal abnormalities at T12, L1, L4 and L5. I think these probably represent atypical hemangiomas. The possibility of metastatic disease does exist but is not favored. Does the patient have any known malignancy?   PATIENT SURVEYS:  FOTO 59/72   SCREENING FOR RED FLAGS: Bowel or bladder incontinence: No Spinal tumors: No Cauda equina syndrome: No Compression fracture: No Abdominal aneurysm: No   COGNITION:           Overall cognitive status: Within functional limits for tasks assessed                          SENSATION: Numbness and tingling on all of her toes    MUSCLE LENGTH: Hamstrings: Right 70 deg; Left 70 deg Thomas test: Right NT deg; Left NT  deg   POSTURE: No Significant postural limitations   PALPATION: Right glute med    LUMBAR ROM:    Active  A/PROM  eval  Flexion 90%  Extension 100%  Right lateral flexion 100%  Left lateral flexion 100%  Right rotation 100%  Left rotation 100%   (Blank rows = not tested)   LOWER  EXTREMITY ROM:          Active  Right 02/28/2022 Left 02/28/2022  Hip flexion 120 120  Hip extension 30 30  Hip abduction 45 45  Hip adduction 30 30  Hip internal rotation 45 45  Hip external rotation 45 45  Knee flexion 135 135  Knee extension 0 0  Ankle dorsiflexion 20 20  Ankle plantarflexion 50 50  Ankle inversion 35 35  Ankle eversion 15 15   (Blank rows = not tested)        LOWER EXTREMITY MMT:     MMT Right eval Left eval 03/07/22 03/07/22  Hip flexion 5 5    Hip extension 4 4    Hip abduction 4 4    Hip adduction 4 4    Hip internal rotation     5 5  Hip external rotation     5 5  Knee flexion 5 5    Knee extension 5 5    Ankle dorsiflexion 5 5    Ankle plantarflexion        Ankle inversion        Ankle eversion         (Blank rows = not tested)   LUMBAR SPECIAL TESTS:  Straight leg raise test: Negative, Slump test: Negative, FABER test: Negative, and Thomas test: Negative FADIR Negative    FUNCTIONAL TESTS:  None    GAIT: Distance walked: 25 ft  Assistive device utilized: None Level of assistance: Complete Independence Comments: No deficits noted        TODAY'S TREATMENT   03/23/22          Nu-Step Seat and Arms 7 and resistance at 3 for 5 min             Standing Hip Abduction with BUE support 1 x 10             Standing Hip Abduction with BUE support 1 x 10 with YTB           -Pt reports increased pain in the back of her knee           Clam Shell with Red TB 3 x 10           -mod VC  to maintain foot contact           Side Lying Hip Adduction 2 x 10           -mod VC and TC              03/15/22           Nu-Step Seat and Arms 7 and resistance at 3 for 5 min                     Hook Lying  Abdominal Reaches 1 x 10            Hook Lying Crunches with hands behind head 3 x 10            - min VC to lift shoulder blades off of the mat            Side Lying Hip Abduction with Red TB 3 x 10             Prone Quad Stretch 4 x 30 sec                         03/07/22            Nu-Step            Supine Bridges 3 x 10            Figure 4 Bridges 3 x 10            73mT:  1,500 ft            -Experienced symptoms after 1,300 ft    PATIENT EDUCATION:  Education details: form and technique for appropriate exercise and explanation of underlying pathology  Person educated: Patient and Child(ren) Education method: Explanation, Demonstration, Verbal cues, and Handouts Education comprehension: verbalized understanding, verbal cues required, and tactile cues required     HOME EXERCISE PROGRAM: Access Code: [email or web address] URL: [email or web address] Date: 03/23/2022 Prepared by: DBradly Chris Exercises - Prone Quadriceps Stretch with Strap  - 1 x daily - 7 x weekly - 1 sets - 3 reps - 60 hold - Figure 4 Bridge  - 1 x daily - 3 x weekly - 3 sets - 10 reps - Ab Prep  - 1 x daily - 3 x weekly - 3 sets - 10 reps - Clamshell with Resistance  - 1 x daily - 3 x weekly - 3 sets - 10 reps - Seated Table Hamstring Stretch  - 1 x daily - 7 x weekly - 1 sets - 3 reps - 60 hold - Sidelying Hip Adduction  - 1 x daily - 3 x weekly - 2 sets - 10 reps ASSESSMENT:   CLINICAL IMPRESSION:  Pt exhibits increased knee pain with activity during today's session. This is likely due to increased activity yesterday from patient having stood to cook all day. She requires mod VC and TC with increased repetition to sequence exercises. Daughter present to record exercises for improved performance. Non-weight bearing exercises did not increase patient's pain and she was able to perform all exercises. Need to assess hip MMT to determine progress. Patient will continue to benefit from skilled PT to resolve these deficits to return to walking and to maintain aerobic fitness.   OBJECTIVE IMPAIRMENTS difficulty walking, decreased strength, and pain.    ACTIVITY LIMITATIONS lifting, bending, and caring for others and walking    PARTICIPATION LIMITATIONS:  shopping and community activity  PERSONAL FACTORS Age are also affecting patient's functional outcome.    REHAB POTENTIAL: Good   CLINICAL DECISION MAKING: Stable/uncomplicated   EVALUATION COMPLEXITY: Low     GOALS: Goals reviewed with patient? No   SHORT TERM GOALS: Target date: 03/20/2022   Pt will be independent with HEP in order to improve strength and balance in order to decrease fall risk and improve function at home and work. Baseline: NT  Goal status: INITIAL     LONG TERM GOALS: Target date: 05/01/2022   Patient will have improved function and activity level as evidenced by an increase in FOTO score by 10 points or more.  Baseline: 59/100 with target 72  Goal status: ONGOING    2.  Patient will improve hip strength by >=1 MMT to decrease stress on surrounding spinal structures for improved symptom response and functional outcomes.  Baseline: Hip Adduction R/L 4/4 Hip Abduction R/L 4/4 Hip Ext R/L 4/4  Goal status: INITIAL   3.  Pt will increase 6MWT by at least 20m(1616f in order to demonstrate clinically significant improvement in cardiopulmonary endurance and community ambulation. Baseline:  03/07/22: 1,700 Goal status: Deferred   PLAN: PT FREQUENCY: 1-2x/week   PT DURATION: 8 weeks   PLANNED INTERVENTIONS: Therapeutic exercises, Therapeutic activity, Neuromuscular re-education, Balance training, Gait training, Patient/Family education, Joint manipulation, Joint mobilization, Stair training, Dry Needling, Spinal manipulation, Spinal mobilization, Cryotherapy, Moist heat, Traction, Manual therapy, and Re-evaluation.   PLAN FOR NEXT SESSION:  Test MMT, Progress Hip and Abdominal Strengthening exercises   DaBradly ChrisT, DPT  03/23/2022, 4:34 PM

## 2022-03-29 ENCOUNTER — Ambulatory Visit: Payer: Medicare HMO | Admitting: Physical Therapy

## 2022-03-30 ENCOUNTER — Encounter: Payer: Medicare HMO | Admitting: Physical Therapy

## 2022-04-04 ENCOUNTER — Other Ambulatory Visit: Payer: Self-pay | Admitting: Family Medicine

## 2022-04-04 ENCOUNTER — Encounter: Payer: Medicare HMO | Admitting: Physical Medicine & Rehabilitation

## 2022-04-04 ENCOUNTER — Ambulatory Visit (INDEPENDENT_AMBULATORY_CARE_PROVIDER_SITE_OTHER): Payer: Medicare HMO | Admitting: Family Medicine

## 2022-04-04 ENCOUNTER — Encounter: Payer: Self-pay | Admitting: Family Medicine

## 2022-04-04 VITALS — BP 110/50 | HR 61 | Temp 97.4°F | Wt 150.5 lb

## 2022-04-04 DIAGNOSIS — R053 Chronic cough: Secondary | ICD-10-CM

## 2022-04-04 DIAGNOSIS — I10 Essential (primary) hypertension: Secondary | ICD-10-CM

## 2022-04-04 DIAGNOSIS — M545 Low back pain, unspecified: Secondary | ICD-10-CM

## 2022-04-04 DIAGNOSIS — E039 Hypothyroidism, unspecified: Secondary | ICD-10-CM | POA: Diagnosis not present

## 2022-04-04 DIAGNOSIS — R600 Localized edema: Secondary | ICD-10-CM | POA: Insufficient documentation

## 2022-04-04 DIAGNOSIS — J069 Acute upper respiratory infection, unspecified: Secondary | ICD-10-CM

## 2022-04-04 DIAGNOSIS — J984 Other disorders of lung: Secondary | ICD-10-CM

## 2022-04-04 MED ORDER — LEVOTHYROXINE SODIUM 75 MCG PO TABS
75.0000 ug | ORAL_TABLET | Freq: Every day | ORAL | 1 refills | Status: DC
Start: 2022-04-04 — End: 2022-08-25

## 2022-04-04 MED ORDER — GABAPENTIN 100 MG PO CAPS: 100.0000 mg | ORAL_CAPSULE | Freq: Every day | ORAL | 0 refills | Status: DC

## 2022-04-04 MED ORDER — BENZONATATE 100 MG PO CAPS: 100.0000 mg | ORAL_CAPSULE | Freq: Three times a day (TID) | ORAL | 0 refills | Status: DC | PRN

## 2022-04-04 MED ORDER — BENZONATATE 100 MG PO CAPS: 100.0000 mg | ORAL_CAPSULE | Freq: Two times a day (BID) | ORAL | 1 refills | Status: DC

## 2022-04-04 MED ORDER — PREDNISONE 20 MG PO TABS: ORAL_TABLET | ORAL | 0 refills | Status: AC

## 2022-04-04 NOTE — Assessment & Plan Note (Signed)
She is on spironolactone, symptoms come and go.  Suspect it may be secondary to amlodipine versus decreased physical activity.  Advised compression socks for upcoming trip.  If blood pressure remains low we will stop amlodipine.

## 2022-04-04 NOTE — Patient Instructions (Addendum)
Leg swelling - check blood pressure - if blood pressure remaining <140/90 - try stopping amlodipine to see if that helps - call if blood pressures  - compression socks  #Back pain - try steroids again - after gabapentin trial - continue with physical therapy   Gabapentin - start 100 mg nightly - if no improvement and tolerating increase to 100 mg twice daily after 2-3 days - update as we may need to increase dose

## 2022-04-04 NOTE — Assessment & Plan Note (Signed)
Lumbar spine with disc disease.  We will do a second round of steroids to see if this helps resolve symptoms.  She has a appointment with PMR in 1 month.  Also discussed trial of gabapentin 100 mg increase to 3 times a day if tolerated.  Discussed risk for kidney issues and will probably need labs repeated in approximately 1 month

## 2022-04-04 NOTE — Assessment & Plan Note (Signed)
Blood pressure low today, advised that she check blood pressure at home and if blood pressure remains low to stop amlodipine.  Monitor to see if leg swelling improves.  Continue Benicar 40 mg and spironolactone 25 mg

## 2022-04-04 NOTE — Assessment & Plan Note (Signed)
Stable continue dose.

## 2022-04-04 NOTE — Assessment & Plan Note (Signed)
Response to Tessalon, initially saw pulm and GI for reflux.

## 2022-04-04 NOTE — Progress Notes (Signed)
Subjective:     Heather Cobb is a 81 y.o. female presenting for Sciatica (R sided )     HPI  #Chronic right sided sciatic pain - mostly when she walks - tylenol w/o improvement - therapy has been inflamed - had to have PMR appointment reschedule - tylenol w/o  improvement - seeing PT but it was worse after last session  #Bilateral leg edema - is taking amlodipine - improvement recently - wearing compression on the right ankle - has not tried compression - bp was high at nephrology who started the medication     Review of Systems   Social History   Tobacco Use  Smoking Status Never  Smokeless Tobacco Never        Objective:    BP Readings from Last 3 Encounters:  04/04/22 (!) 110/50  02/09/22 120/60  01/12/22 130/62   Wt Readings from Last 3 Encounters:  04/04/22 150 lb 8 oz (68.3 kg)  03/01/22 151 lb (68.5 kg)  02/09/22 151 lb 2 oz (68.5 kg)    BP (!) 110/50   Pulse 61   Temp (!) 97.4 F (36.3 C) (Temporal)   Wt 150 lb 8 oz (68.3 kg)   SpO2 97%   BMI 25.43 kg/m    Physical Exam Constitutional:      General: She is not in acute distress.    Appearance: She is well-developed. She is not diaphoretic.  HENT:     Right Ear: External ear normal.     Left Ear: External ear normal.     Nose: Nose normal.  Eyes:     Conjunctiva/sclera: Conjunctivae normal.  Cardiovascular:     Rate and Rhythm: Normal rate and regular rhythm.     Heart sounds: No murmur heard. Pulmonary:     Effort: Pulmonary effort is normal. No respiratory distress.     Breath sounds: Normal breath sounds. No wheezing.  Musculoskeletal:     Cervical back: Neck supple.     Comments: Trace b/l LE edema  Skin:    General: Skin is warm and dry.     Capillary Refill: Capillary refill takes less than 2 seconds.  Neurological:     Mental Status: She is alert. Mental status is at baseline.  Psychiatric:        Mood and Affect: Mood normal.        Behavior: Behavior normal.            Assessment & Plan:   Problem List Items Addressed This Visit       Cardiovascular and Mediastinum   Essential hypertension, benign - Primary    Blood pressure low today, advised that she check blood pressure at home and if blood pressure remains low to stop amlodipine.  Monitor to see if leg swelling improves.  Continue Benicar 40 mg and spironolactone 25 mg        Endocrine   Hypothyroidism    Stable continue dose.      Relevant Medications   levothyroxine (SYNTHROID) 75 MCG tablet     Other   Chronic cough    Response to Tessalon, initially saw pulm and GI for reflux.      Relevant Medications   benzonatate (TESSALON PERLES) 100 MG capsule   Acute right-sided low back pain without sciatica    Lumbar spine with disc disease.  We will do a second round of steroids to see if this helps resolve symptoms.  She has a appointment with PMR in  1 month.  Also discussed trial of gabapentin 100 mg increase to 3 times a day if tolerated.  Discussed risk for kidney issues and will probably need labs repeated in approximately 1 month      Relevant Medications   predniSONE (DELTASONE) 20 MG tablet   Bilateral leg edema    She is on spironolactone, symptoms come and go.  Suspect it may be secondary to amlodipine versus decreased physical activity.  Advised compression socks for upcoming trip.  If blood pressure remains low we will stop amlodipine.      Other Visit Diagnoses     Scarring of lung       Relevant Medications   benzonatate (TESSALON PERLES) 100 MG capsule        Return in about 6 weeks (around 05/16/2022) for annual exam.  Lesleigh Noe, MD

## 2022-04-05 ENCOUNTER — Encounter: Payer: Self-pay | Admitting: Physical Therapy

## 2022-04-05 ENCOUNTER — Other Ambulatory Visit (HOSPITAL_COMMUNITY): Payer: Self-pay

## 2022-04-05 ENCOUNTER — Ambulatory Visit: Payer: Medicare HMO | Admitting: Physical Therapy

## 2022-04-05 DIAGNOSIS — N3000 Acute cystitis without hematuria: Secondary | ICD-10-CM

## 2022-04-05 DIAGNOSIS — R262 Difficulty in walking, not elsewhere classified: Secondary | ICD-10-CM | POA: Diagnosis not present

## 2022-04-05 DIAGNOSIS — M5459 Other low back pain: Secondary | ICD-10-CM | POA: Diagnosis not present

## 2022-04-05 NOTE — Therapy (Signed)
OUTPATIENT PHYSICAL THERAPY PROGRESS NOTE   Patient Name: Heather Cobb MRN: [id number] DOB:1941/03/24, 81 y.o., female Today's Date: 04/05/2022  PCP: Dr. Waunita Schooner REFERRING PROVIDER: Dr. Waunita Schooner   END OF SESSION:   PT End of Session - 04/05/22 1431     Visit Number 5    Number of Visits 16    Date for PT Re-Evaluation 04/26/22    Authorization Type [employer]    PT Start Time 4098    PT Stop Time 1500    PT Time Calculation (min) 40 min    Activity Tolerance Patient tolerated treatment well    Behavior During Therapy [facility] for tasks assessed/performed             Past Medical History:  Diagnosis Date   Anemia    Carotid body tumor ([place])    left   Family history of colon cancer    Hypertension    Hypothyroid    Pyelonephritis    Rectal bleeding 11/26/2015   Vertigo    Vitamin B 12 deficiency    Past Surgical History:  Procedure Laterality Date   ABDOMINAL HYSTERECTOMY     CATARACT EXTRACTION W/ INTRAOCULAR LENS  IMPLANT, BILATERAL     CHOLECYSTECTOMY N/A 04/15/2015   Procedure: LAPAROSCOPIC CHOLECYSTECTOMY;  Surgeon: Aviva Signs, MD;  Location: [facility];  Service: General;  Laterality: N/A;   COLONOSCOPY  2012   [place]   COLONOSCOPY WITH PROPOFOL N/A 02/03/2016   Procedure: COLONOSCOPY WITH PROPOFOL;  Surgeon: Robert Bellow, MD;  Location: [facility];  Service: Endoscopy;  Laterality: N/A;   EYE SURGERY Bilateral    cataract extractions   HEMORRHOID SURGERY N/A 08/31/2018   Procedure: HEMORRHOIDECTOMY;  Surgeon: Robert Bellow, MD;  Location: [facility];  Service: General;  Laterality: N/A;   IR ANGIO EXTERNAL CAROTID SEL EXT CAROTID UNI L MOD SED  10/26/2017   IR ANGIO INTRA EXTRACRAN SEL COM CAROTID INNOMINATE BILAT MOD SED  10/26/2017   IR ANGIO VERTEBRAL SEL VERTEBRAL UNI R MOD SED  10/26/2017   IR ANGIOGRAM EXTREMITY LEFT  10/26/2017   IR ANGIOGRAM FOLLOW UP STUDY  10/26/2017   IR NEURO EACH ADD'L AFTER BASIC UNI LEFT (MS)  10/26/2017   IR  RADIOLOGIST EVAL & MGMT  09/21/2017   IR TRANSCATH/EMBOLIZ  10/26/2017   MASS EXCISION Left 10/27/2017   Procedure: EXCISION CAROTID BODY TUMOR;  Surgeon: Melida Quitter, MD;  Location: [place];  Service: ENT;  Laterality: Left;   RADIOLOGY WITH ANESTHESIA N/A 10/26/2017   Procedure: EMBOLIZATION;  Surgeon: Luanne Bras, MD;  Location: [place];  Service: Radiology;  Laterality: N/A;   SIGMOIDOSCOPY N/A 08/31/2018   Procedure: SIGMOIDOSCOPY-IN  OR;  Surgeon: Robert Bellow, MD;  Location: [facility];  Service: General;  Laterality: N/A;   UPPER GASTROINTESTINAL ENDOSCOPY  2014   VESICOVAGINAL FISTULA CLOSURE W/ TAH     Patient Active Problem List   Diagnosis Date Noted   Bilateral leg edema 04/04/2022   Localized swelling of right lower leg 02/09/2022   Dermatitis 01/12/2022   Acute right-sided low back pain without sciatica 12/22/2021   Osteopenia 12/22/2021   Viral URI with cough 09/23/2021   UTI (urinary tract infection) 04/07/2021   Hot flashes 01/14/2021   Decreased hearing of left ear 01/14/2021   Acute left ankle pain 06/22/2020   Prediabetes 04/09/2020   Vaginal itching 04/09/2020   Polycystic liver disease 10/24/2019   Coronary atherosclerosis due to calcified coronary lesion of native artery 10/24/2019  Counseling for estrogen replacement therapy 10/15/2019   Chronic cough 10/15/2019   Chronic kidney disease, stage 3a ([place]) 10/15/2019   Genetic testing 02/06/2018   Family history of colon cancer    Carotid body tumor ([place]) 10/26/2017   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 Verda Cumins Note    This is a chronic problem. The current episode started in the past 7 days. The pain is present in the lumbar spine and gluteal. The quality of the pain is described as aching. The pain radiates to the right thigh and right knee. The pain is at a severity of 8/10. Exacerbated by: moving around. Pertinent negatives include no bladder incontinence, bowel incontinence, fever, numbness, tingling or weakness.    Steroids on 01/12/2022 - with improvement in pain, did have some increased hunger   Has been doing the home handout of exercises   Did resolve with steroids After about 1 week symptoms returned  PRECAUTIONS: None   SUBJECTIVE: Pt states that she has increased knee pain after standing for long periods of time to cook yesterday for July 4th festivities.   PAIN:  Are you having pain? Yes: NPRS scale: 3/10 Pain location: Right sided low back pain that radiates down right leg  Pain description: Achy  Aggravating factors: Walking long distances  Relieving factors: Tylenol arthritis    OBJECTIVE:                VITALS: BP 139/63 HR 60 SpO2 100   DIAGNOSTIC FINDINGS:  CLINICAL DATA:  Low back pain, symptoms persist with greater than 6 weeks of treatment. Right-sided pain shooting to the right leg.   EXAM: MRI LUMBAR SPINE WITHOUT CONTRAST   TECHNIQUE: Multiplanar, multisequence MR imaging of the lumbar spine was performed. No intravenous contrast was administered.   COMPARISON:  Radiography 01/12/2022   FINDINGS: Segmentation:  5 lumbar type vertebral bodies.   Alignment:  Normal   Vertebrae: Marrow space lesions affecting T12, L1, L4 and L5, most extensive at L4. I think these most likely represent atypical hemangiomas. However, the possibility of osseous metastatic disease is not completely ruled out.   Conus medullaris and cauda equina: Conus extends  to the L1 level. Conus and cauda equina appear normal.   Paraspinal and other soft tissues: Negative   Disc levels:   T11-12 through L1-2: Normal   L2-3: Minimal disc bulge.  Minimal facet hypertrophy.  No stenosis.   L3-4: Mild bulging of the disc. Mild facet hypertrophy. No stenosis.   L4-5: Mild bulging of the disc. Mild facet and ligamentous hypertrophy. No compressive stenosis.   L5-S1: Shallow protrusion of the disc. Mild indentation of the thecal sac. Mild narrowing of the lateral recesses and foramina. Foraminal encroachment is more pronounced on the right, where the right L5 nerve could be affected.   IMPRESSION: L5-S1: Shallow disc protrusion. Mild indentation of the thecal sac with mild stenosis of the subarticular lateral recesses and neural foramina. Foraminal encroachment is more pronounced on the right, where the right L5 nerve could be irritated.   Mild, non-compressive  degenerative changes from L2-3 through L4-5 as described above.   Marrow space signal abnormalities at T12, L1, L4 and L5. I think these probably represent atypical hemangiomas. The possibility of metastatic disease does exist but is not favored. Does the patient have any known malignancy?   PATIENT SURVEYS:  FOTO 59/72   SCREENING FOR RED FLAGS: Bowel or bladder incontinence: No Spinal tumors: No Cauda equina syndrome: No Compression fracture: No Abdominal aneurysm: No   COGNITION:           Overall cognitive status: Within functional limits for tasks assessed                          SENSATION: Numbness and tingling on all of her toes    MUSCLE LENGTH: Hamstrings: Right 70 deg; Left 70 deg Thomas test: Right NT deg; Left NT  deg   POSTURE: No Significant postural limitations   PALPATION: Right glute med    LUMBAR ROM:    Active  A/PROM  eval  Flexion 90%  Extension 100%  Right lateral flexion 100%  Left lateral flexion 100%  Right rotation 100%  Left rotation 100%    (Blank rows = not tested)   LOWER EXTREMITY ROM:          Active  Right 02/28/2022 Left 02/28/2022  Hip flexion 120 120  Hip extension 30 30  Hip abduction 45 45  Hip adduction 30 30  Hip internal rotation 45 45  Hip external rotation 45 45  Knee flexion 135 135  Knee extension 0 0  Ankle dorsiflexion 20 20  Ankle plantarflexion 50 50  Ankle inversion 35 35  Ankle eversion 15 15   (Blank rows = not tested)        LOWER EXTREMITY MMT:     MMT Right eval Left eval 03/07/22 03/07/22  Hip flexion 5 5    Hip extension 4 4    Hip abduction 4 4    Hip adduction 4 4    Hip internal rotation     5 5  Hip external rotation     5 5  Knee flexion 5 5    Knee extension 5 5    Ankle dorsiflexion 5 5    Ankle plantarflexion        Ankle inversion        Ankle eversion         (Blank rows = not tested)   LUMBAR SPECIAL TESTS:  Straight leg raise test: Negative, Slump test: Negative, FABER test: Negative, and Thomas test: Negative FADIR Negative    FUNCTIONAL TESTS:  None    GAIT: Distance walked: 25 ft  Assistive device utilized: None Level of assistance: Complete Independence Comments: No deficits noted        TODAY'S TREATMENT   04/05/22         TM 1 mph for 5 min with BUE support           Hip MMT:              Abduction R/L 4+/4+              Adduction R/L 4/4+             Ext            R/L 4-/4-                   FOTO 67/100  Mini-Squats with BUE support 3 x 10             -TC of mat raised for pt to perform butt tap                    03/23/22          Nu-Step Seat and Arms 7 and resistance at 3 for 5 min             Standing Hip Abduction with BUE support 1 x 10             Standing Hip Abduction with BUE support 1 x 10 with YTB           -Pt reports increased pain in the back of her knee           Clam Shell with Red TB 3 x 10           -mod VC to maintain foot contact           Side Lying Hip Adduction 2 x 10           -mod VC and TC               03/15/22           Nu-Step Seat and Arms 7 and resistance at 3 for 5 min                     Hook Lying  Abdominal Reaches 1 x 10            Hook Lying Crunches with hands behind head 3 x 10            - min VC to lift shoulder blades off of the mat            Side Lying Hip Abduction with Red TB 3 x 10             Prone Quad Stretch 4 x 30 sec                        03/07/22            Nu-Step            Supine Bridges 3 x 10            Figure 4 Bridges 3 x 10            107mT:  1,500 ft            -Experienced symptoms after 1,300 ft    PATIENT EDUCATION:  Education details: form and technique for appropriate exercise and explanation of underlying pathology  Person educated: Patient and Child(ren) Education method: Explanation, Demonstration, Verbal cues, and Handouts Education comprehension: verbalized understanding, verbal cues required, and tactile cues required     HOME EXERCISE PROGRAM: Access Code: [email or web address] URL: [email or web address] Date: 04/05/2022 Prepared by: DBradly Chris Exercises - Prone Quadriceps Stretch with Strap  - 1 x daily - 7 x weekly - 1 sets - 3 reps - 60 hold - Ab Prep  - 1 x daily - 3 x weekly - 3 sets - 10 reps - Clamshell with Resistance  - 1 x daily - 3 x weekly - 3 sets - 10 reps - Seated Table Hamstring Stretch  - 1 x daily - 7 x weekly - 1 sets - 3 reps - 60 hold - Sidelying Hip Adduction  -  1 x daily - 3 x weekly - 2 sets - 10 reps - Mini Squat with Counter Support  - 1 x daily - 3 x weekly - 3 sets - 10 reps  ASSESSMENT:   CLINICAL IMPRESSION:  Pt shows an improvement in hip strength with improved abduction and adduction, but she is still showing weakness with extension. She shows improved perception of function with increased FOTO score. Exercise modified to include mini-squat to increase hip extension strength with minimal cuing for exercise sequencing. She will continue to benefit from skilled PT to resolve  these deficits to return to walking and to maintain aerobic fitness.    OBJECTIVE IMPAIRMENTS difficulty walking, decreased strength, and pain.    ACTIVITY LIMITATIONS lifting, bending, and caring for others and walking    PARTICIPATION LIMITATIONS: shopping and community activity   PERSONAL FACTORS Age are also affecting patient's functional outcome.    REHAB POTENTIAL: Good   CLINICAL DECISION MAKING: Stable/uncomplicated   EVALUATION COMPLEXITY: Low     GOALS: Goals reviewed with patient? No   SHORT TERM GOALS: Target date: 03/20/2022   Pt will be independent with HEP in order to improve strength and balance in order to decrease fall risk and improve function at home and work. Baseline: NT  Goal status: ongoing      LONG TERM GOALS: Target date: 05/01/2022   Patient will have improved function and activity level as evidenced by an increase in FOTO score by 10 points or more.  Baseline: 59/100 with target 72 04/05/22: 67/72 Goal status: PARTIALLY MET    2.  Patient will improve hip strength by >=1 MMT to decrease stress on surrounding spinal structures for improved symptom response and functional outcomes.  Baseline: Hip Adduction R/L 4/4 Hip Abduction R/L 4/4 Hip Ext R/L 4/4 04/05/22:    Abd R/L 4+/4+      Add R/L 4/4+, Ext R/L 4-/4-          Goal status: PARTIALLY MET    3.  Pt will increase 6MWT by at least 43m(169f in order to demonstrate clinically significant improvement in cardiopulmonary endurance and community ambulation. Baseline:  03/07/22: 1,700 Goal status: Deferred   PLAN: PT FREQUENCY: 1-2x/week   PT DURATION: 8 weeks   PLANNED INTERVENTIONS: Therapeutic exercises, Therapeutic activity, Neuromuscular re-education, Balance training, Gait training, Patient/Family education, Joint manipulation, Joint mobilization, Stair training, Dry Needling, Spinal manipulation, Spinal mobilization, Cryotherapy, Moist heat, Traction, Manual therapy, and Re-evaluation.    PLAN FOR NEXT SESSION:   Progress Hip and Abdominal Strengthening exercises with adjustment of sets and reps to endurance. Matrix hip adduction machine    DaBradly ChrisT, DPT  04/05/2022, 3:05 PM

## 2022-04-06 ENCOUNTER — Ambulatory Visit (HOSPITAL_COMMUNITY)
Admission: RE | Admit: 2022-04-06 | Discharge: 2022-04-06 | Disposition: A | Discharge status: Home / Self Care | Payer: Medicare HMO | Source: Ambulatory Visit | Attending: Hematology and Oncology | Admitting: Hematology and Oncology

## 2022-04-06 ENCOUNTER — Inpatient Hospital Stay (HOSPITAL_COMMUNITY): Payer: Medicare HMO | Attending: Hematology

## 2022-04-06 DIAGNOSIS — D447 Neoplasm of uncertain behavior of aortic body and other paraganglia: Secondary | ICD-10-CM | POA: Insufficient documentation

## 2022-04-06 DIAGNOSIS — I6523 Occlusion and stenosis of bilateral carotid arteries: Secondary | ICD-10-CM | POA: Diagnosis not present

## 2022-04-06 DIAGNOSIS — R232 Flushing: Secondary | ICD-10-CM | POA: Insufficient documentation

## 2022-04-06 DIAGNOSIS — K110 Atrophy of salivary gland: Secondary | ICD-10-CM | POA: Diagnosis not present

## 2022-04-06 DIAGNOSIS — M79604 Pain in right leg: Secondary | ICD-10-CM | POA: Diagnosis not present

## 2022-04-06 DIAGNOSIS — N189 Chronic kidney disease, unspecified: Secondary | ICD-10-CM | POA: Diagnosis not present

## 2022-04-06 DIAGNOSIS — N3000 Acute cystitis without hematuria: Secondary | ICD-10-CM

## 2022-04-06 DIAGNOSIS — Z800 Family history of malignant neoplasm of digestive organs: Secondary | ICD-10-CM | POA: Insufficient documentation

## 2022-04-06 DIAGNOSIS — M47812 Spondylosis without myelopathy or radiculopathy, cervical region: Secondary | ICD-10-CM | POA: Diagnosis not present

## 2022-04-06 LAB — CBC WITH DIFFERENTIAL/PLATELET
Abs Immature Granulocytes: 0.01 10*3/uL (ref 0.00–0.07)
Basophils Absolute: 0 10*3/uL (ref 0.0–0.1)
Basophils Relative: 0 %
Eosinophils Absolute: 0.1 10*3/uL (ref 0.0–0.5)
Eosinophils Relative: 3 %
HCT: 37.1 % (ref 36.0–46.0)
Hemoglobin: 12.1 g/dL (ref 12.0–15.0)
Immature Granulocytes: 0 %
Lymphocytes Relative: 38 %
Lymphs Abs: 1.8 10*3/uL (ref 0.7–4.0)
MCH: 29.6 pg (ref 26.0–34.0)
MCHC: 32.6 g/dL (ref 30.0–36.0)
MCV: 90.7 fL (ref 80.0–100.0)
Monocytes Absolute: 0.5 10*3/uL (ref 0.1–1.0)
Monocytes Relative: 9 %
Neutro Abs: 2.4 10*3/uL (ref 1.7–7.7)
Neutrophils Relative %: 50 %
Platelets: 219 10*3/uL (ref 150–400)
RBC: 4.09 MIL/uL (ref 3.87–5.11)
RDW: 13.6 % (ref 11.5–15.5)
WBC: 4.8 10*3/uL (ref 4.0–10.5)
nRBC: 0 % (ref 0.0–0.2)

## 2022-04-06 MED ORDER — IOHEXOL 300 MG/ML  SOLN
60.0000 mL | Freq: Once | INTRAMUSCULAR | Status: AC | PRN
  Administered 2022-04-06: 60 mL via INTRAVENOUS

## 2022-04-11 ENCOUNTER — Ambulatory Visit: Payer: Medicare HMO | Admitting: Physical Therapy

## 2022-04-12 ENCOUNTER — Inpatient Hospital Stay (HOSPITAL_BASED_OUTPATIENT_CLINIC_OR_DEPARTMENT_OTHER): Payer: Medicare HMO | Admitting: Hematology

## 2022-04-12 VITALS — BP 149/79 | HR 60 | Temp 97.9°F | Resp 18 | Ht 63.5 in | Wt 150.0 lb

## 2022-04-12 DIAGNOSIS — D447 Neoplasm of uncertain behavior of aortic body and other paraganglia: Secondary | ICD-10-CM | POA: Diagnosis not present

## 2022-04-12 DIAGNOSIS — R232 Flushing: Secondary | ICD-10-CM | POA: Diagnosis not present

## 2022-04-12 DIAGNOSIS — M79604 Pain in right leg: Secondary | ICD-10-CM | POA: Diagnosis not present

## 2022-04-12 DIAGNOSIS — D446 Neoplasm of uncertain behavior of carotid body: Secondary | ICD-10-CM | POA: Diagnosis not present

## 2022-04-12 DIAGNOSIS — N189 Chronic kidney disease, unspecified: Secondary | ICD-10-CM | POA: Diagnosis not present

## 2022-04-12 DIAGNOSIS — Z800 Family history of malignant neoplasm of digestive organs: Secondary | ICD-10-CM | POA: Diagnosis not present

## 2022-04-12 NOTE — Patient Instructions (Signed)
[facility] at [facility] Discharge Instructions   You were seen and examined today by Dr. Katragadda.     Thank you for choosing [facility] at [facility] to provide your oncology and hematology care.  To afford each patient quality time with our provider, please arrive at least 15 minutes before your scheduled appointment time.   If you have a lab appointment with the Cancer Center please come in thru the Main Entrance and check in at the main information desk.  You need to re-schedule your appointment should you arrive 10 or more minutes late.  We strive to give you quality time with our providers, and arriving late affects you and other patients whose appointments are after yours.  Also, if you no show three or more times for appointments you may be dismissed from the clinic at the providers discretion.     Again, thank you for choosing [facility].  Our hope is that these requests will decrease the amount of time that you wait before being seen by our physicians.       _____________________________________________________________  Should you have questions after your visit to [facility], please contact our office at [phone] and follow the prompts.  Our office hours are 8:00 a.m. and 4:30 p.m. Monday - Friday.  Please note that voicemails left after 4:00 p.m. may not be returned until the following business day.  We are closed weekends and major holidays.  You do have access to a nurse 24-7, just call the main number to the clinic [phone] and do not press any options, hold on the line and a nurse will answer the phone.    For prescription refill requests, have your pharmacy contact our office and allow 72 hours.    Due to Covid, you will need to wear a mask upon entering the hospital. If you do not have a mask, a mask will be given to you at the Main Entrance upon arrival. For doctor visits, patients may  have 1 support person age 18 or older with them. For treatment visits, patients can not have anyone with them due to social distancing guidelines and our immunocompromised population.      

## 2022-04-12 NOTE — Progress Notes (Signed)
Heather Cobb, [place] [address]   CLINIC:  Medical Oncology/Hematology  PCP:  Lesleigh Noe, [place] / [place] [address] [phone]   REASON FOR VISIT:  Follow-up for paraganglioma  PRIOR THERAPY: Excision of left carotid body tumor on 10/27/2017  CURRENT THERAPY: Surveillance  BRIEF ONCOLOGIC HISTORY:  Oncology History   No history exists.    CANCER STAGING: Cancer Staging  No matching staging information was found for the patient.  INTERVAL HISTORY:  [name], a 81 y.o. 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 Heather Cobb was last seen on 03/31/2020.   Today she reports feeling good. She reports sciatica pain in her right leg. She denies fevers, night sweats, and weight loss. She reports hot flashes.   REVIEW OF SYSTEMS:  Review of Systems  Constitutional:  Negative for appetite change, fatigue, fever and unexpected weight change.  Gastrointestinal:  Positive for constipation.  Endocrine: Positive for hot flashes.  Neurological:  Positive for numbness (R sciatica).  All other systems reviewed and are negative.   PAST MEDICAL/SURGICAL HISTORY:  Past Medical History:  Diagnosis Date   Anemia    Carotid body tumor ([place])    left   Family history of colon cancer    Hypertension    Hypothyroid    Pyelonephritis    Rectal bleeding 11/26/2015   Vertigo    Vitamin B 12 deficiency    Past Surgical History:  Procedure Laterality Date   ABDOMINAL HYSTERECTOMY     CATARACT EXTRACTION W/ INTRAOCULAR LENS  IMPLANT, BILATERAL     CHOLECYSTECTOMY N/A 04/15/2015   Procedure: LAPAROSCOPIC CHOLECYSTECTOMY;  Surgeon: Aviva Signs, MD;  Location: [facility];  Service: General;  Laterality: N/A;   COLONOSCOPY  2012   [place]   COLONOSCOPY WITH PROPOFOL N/A 02/03/2016   Procedure: COLONOSCOPY WITH PROPOFOL;  Surgeon: Robert Bellow, MD;  Location: [facility];  Service: Endoscopy;  Laterality:  N/A;   EYE SURGERY Bilateral    cataract extractions   HEMORRHOID SURGERY N/A 08/31/2018   Procedure: HEMORRHOIDECTOMY;  Surgeon: Robert Bellow, MD;  Location: [facility];  Service: General;  Laterality: N/A;   IR ANGIO EXTERNAL CAROTID SEL EXT CAROTID UNI L MOD SED  10/26/2017   IR ANGIO INTRA EXTRACRAN SEL COM CAROTID INNOMINATE BILAT MOD SED  10/26/2017   IR ANGIO VERTEBRAL SEL VERTEBRAL UNI R MOD SED  10/26/2017   IR ANGIOGRAM EXTREMITY LEFT  10/26/2017   IR ANGIOGRAM FOLLOW UP STUDY  10/26/2017   IR NEURO EACH ADD'L AFTER BASIC UNI LEFT (MS)  10/26/2017   IR RADIOLOGIST EVAL & MGMT  09/21/2017   IR TRANSCATH/EMBOLIZ  10/26/2017   MASS EXCISION Left 10/27/2017   Procedure: EXCISION CAROTID BODY TUMOR;  Surgeon: Melida Quitter, MD;  Location: [facility];  Service: ENT;  Laterality: Left;   RADIOLOGY WITH ANESTHESIA N/A 10/26/2017   Procedure: EMBOLIZATION;  Surgeon: Luanne Bras, MD;  Location: [facility];  Service: Radiology;  Laterality: N/A;   SIGMOIDOSCOPY N/A 08/31/2018   Procedure: SIGMOIDOSCOPY-IN  OR;  Surgeon: Robert Bellow, MD;  Location: [facility];  Service: General;  Laterality: N/A;   UPPER GASTROINTESTINAL ENDOSCOPY  2014   VESICOVAGINAL FISTULA CLOSURE W/ TAH      SOCIAL HISTORY:  Social History   Socioeconomic History   Marital status: Widowed    Spouse name: Not on file   Number of children: 1   Years of education: high  school   Highest education level: Not on file  Occupational History   Occupation: Engineer, manufacturing systems    Comment: retired  Tobacco Use   Smoking status: Never   Smokeless tobacco: Never  Vaping Use   Vaping Use: Never used  Substance and Sexual Activity   Alcohol use: No    Alcohol/week: 0.0 standard drinks of alcohol   Drug use: No   Sexual activity: Not Currently    Birth control/protection: Surgical  Other Topics Concern   Not on file  Social History Narrative   10/15/19   From: the area   Living: with Daugher [name]) and son-in-law   Work: retired  from [employer] work, 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      Family: lives with daughter, has 1 grandson and 3 great-grandchildren and 1 great-great-grandchild      Enjoys: eat, dance, travel, cooking      Exercise: walk - not as much in cold, will do 1 mile/day   Diet: good, tries to eat health      Safety   Seat belts: Yes    Guns: Yes  and secure   Safe in relationships: Yes    Social Determinants of Health   Financial Resource Strain: Low Risk  (03/01/2022)   Overall Financial Resource Strain (CARDIA)    Difficulty of Paying Living Expenses: Not hard at all  Food Insecurity: No Food Insecurity (03/01/2022)   Hunger Vital Sign    Worried About Running Out of Food in the Last Year: Never true    [place] in the Last Year: Never true  Transportation Needs: No Transportation Needs (03/01/2022)   PRAPARE - Hydrologist (Medical): No    Lack of Transportation (Non-Medical): No  Physical Activity: Insufficiently Active (03/01/2022)   Exercise Vital Sign    Days of Exercise per Week: 2 days    Minutes of Exercise per Session: 30 min  Stress: No Stress Concern Present (03/01/2022)   [place]    Feeling of Stress : Not at all  Social Connections: Moderately Integrated (03/01/2022)   Social Connection and Isolation Panel [NHANES]    Frequency of Communication with Friends and Family: More than three times a week    Frequency of Social Gatherings with Friends and Family: Three times a week    Attends Religious Services: More than 4 times per year    Active Member of Clubs or Organizations: Yes    Attends Archivist Meetings: More than 4 times per year    Marital Status: Widowed  Intimate Partner Violence: Not At Risk (03/01/2022)   Humiliation, Afraid, Rape, and Kick questionnaire    Fear of Current or Ex-Partner: No    Emotionally Abused: No    Physically Abused: No    Sexually  Abused: No    FAMILY HISTORY:  Family History  Problem Relation Age of Onset   Asthma Mother    Kidney failure Mother    Hypertension Daughter    Colon cancer Father        dx in his 64s   Stomach cancer Sister        stage 4   Colon cancer Maternal Uncle    Heart attack Maternal Grandmother    Asthma Brother    Cancer Neg Hx     CURRENT MEDICATIONS:  Current Outpatient Medications  Medication Sig Dispense Refill   acetaminophen (TYLENOL) 325 MG  tablet Take 650 mg by mouth every 6 (six) hours as needed.     albuterol (VENTOLIN HFA) 108 (90 Base) MCG/ACT inhaler Inhale 2 puffs into the lungs every 6 (six) hours as needed for wheezing or shortness of breath. 18 g 11   amLODipine (NORVASC) 5 MG tablet Take 5 mg by mouth 2 (two) times daily.     aspirin EC 81 MG tablet Take 81 mg by mouth daily.     benzonatate (TESSALON PERLES) 100 MG capsule Take 1 capsule (100 mg total) by mouth 2 (two) times daily. 180 capsule 1   cholecalciferol (VITAMIN D) 1000 UNITS tablet Take 1,000 Units by mouth daily.     clotrimazole-betamethasone (LOTRISONE) cream APPLY TOPICALLY TO THE AFFECTED AREA TWICE DAILY 30 g 3   ferrous sulfate 325 (65 FE) MG tablet Take 325 mg by mouth daily with breakfast.     gabapentin (NEURONTIN) 100 MG capsule Take 1 capsule (100 mg total) by mouth at bedtime. 30 capsule 0   levalbuterol (XOPENEX HFA) 45 MCG/ACT inhaler Inhale 1-2 puffs into the lungs every 4 (four) hours as needed for wheezing. 1 Inhaler 0   levothyroxine (SYNTHROID) 75 MCG tablet Take 1 tablet (75 mcg total) by mouth daily before breakfast. 90 tablet 1   linaclotide (LINZESS) 145 MCG CAPS capsule Take 1 capsule (145 mcg total) by mouth daily as needed (for constipation). 90 capsule 3   mometasone (NASONEX) 50 MCG/ACT nasal spray Place 2 sprays into the nose daily. (Patient taking differently: Place 2 sprays into the nose as needed.) 1 g 0   olmesartan (BENICAR) 40 MG tablet TAKE 1 TABLET(40 MG) BY MOUTH  DAILY 90 tablet 3   pantoprazole (PROTONIX) 40 MG tablet TAKE 1 TABLET(40 MG) BY MOUTH DAILY 90 tablet 3   predniSONE (DELTASONE) 20 MG tablet Take 2 tablets (40 mg total) by mouth daily with breakfast for 4 days, THEN 1 tablet (20 mg total) daily with breakfast for 4 days, THEN 0.5 tablets (10 mg total) daily with breakfast for 4 days. 14 tablet 0   spironolactone (ALDACTONE) 25 MG tablet Take 1 tablet (25 mg total) by mouth daily. For blood pressure. 90 tablet 1   vitamin B-12 (CYANOCOBALAMIN) 1000 MCG tablet Take 1,000 mcg by mouth daily.      No current facility-administered medications for this visit.    ALLERGIES:  Allergies  Allergen Reactions   Codeine Nausea Only and Other (See Comments)    fever fever fever    PHYSICAL EXAM:  Performance status (ECOG): 1 - Symptomatic but completely ambulatory  There were no vitals filed for this visit. Wt Readings from Last 3 Encounters:  04/04/22 150 lb 8 oz (68.3 kg)  03/01/22 151 lb (68.5 kg)  02/09/22 151 lb 2 oz (68.5 kg)   Physical Exam Vitals reviewed.  Constitutional:      Appearance: Normal appearance.  Cardiovascular:     Rate and Rhythm: Normal rate and regular rhythm.     Pulses: Normal pulses.     Heart sounds: Normal heart sounds.  Pulmonary:     Effort: Pulmonary effort is normal.     Breath sounds: Normal breath sounds.  Abdominal:     Palpations: Abdomen is soft. There is no hepatomegaly, splenomegaly or mass.     Tenderness: There is no abdominal tenderness.  Lymphadenopathy:     Cervical: No cervical adenopathy.     Right cervical: No superficial, deep or posterior cervical adenopathy.    Left cervical: No  superficial, deep or posterior cervical adenopathy.     Upper Body:     Right upper body: No supraclavicular or axillary adenopathy.     Left upper body: No supraclavicular or axillary adenopathy.     Lower Body: No right inguinal adenopathy. No left inguinal adenopathy.  Neurological:     General: No  focal deficit present.     Mental Status: She is alert and oriented to person, place, and time.  Psychiatric:        Mood and Affect: Mood normal.        Behavior: Behavior normal.      LABORATORY DATA:  I have reviewed the labs as listed.     Latest Ref Rng & Units 04/06/2022    8:56 AM 03/31/2021    8:55 AM 01/14/2021   10:04 AM  CBC  WBC 4.0 - 10.5 K/uL 4.8  5.2  4.7   Hemoglobin 12.0 - 15.0 g/dL 12.1  12.9  12.9   Hematocrit 36.0 - 46.0 % 37.1  40.8  38.7   Platelets 150 - 400 K/uL 219  222  219.0       Latest Ref Rng & Units 02/23/2022    1:52 PM 03/31/2021    8:55 AM 03/02/2021   11:27 AM  CMP  Glucose 70 - 99 mg/dL 117  113  94   BUN 6 - 23 mg/dL 16  16  15    Creatinine 0.40 - 1.20 mg/dL 1.21  1.22  1.30   Sodium 135 - 145 mEq/L 141  142  142   Potassium 3.5 - 5.1 mEq/L 3.8  3.7  3.7   Chloride 96 - 112 mEq/L 104  102  103   CO2 19 - 32 mEq/L 29  30  32   Calcium 8.4 - 10.5 mg/dL 10.4  10.1  10.0   Total Protein 6.5 - 8.1 g/dL  7.6    Total Bilirubin 0.3 - 1.2 mg/dL  0.9    Alkaline Phos 38 - 126 U/L  66    AST 15 - 41 U/L  16    ALT 0 - 44 U/L  13      DIAGNOSTIC IMAGING:  I have independently reviewed the scans and discussed with the patient. CT Soft Tissue Neck W Contrast  Result Date: 04/06/2022 CLINICAL DATA:  Paraganglioma status post excision in 2019. EXAM: CT NECK WITH CONTRAST TECHNIQUE: Multidetector CT imaging of the neck was performed using the standard protocol following the bolus administration of intravenous contrast. RADIATION DOSE REDUCTION: This exam was performed according to the departmental dose-optimization program which includes automated exposure control, adjustment of the mA and/or kV according to patient size and/or use of iterative reconstruction technique. CONTRAST:  21m OMNIPAQUE IOHEXOL 300 MG/ML  SOLN COMPARISON:  CT neck most recently 03/31/2021 FINDINGS: Pharynx and larynx: The nasal cavity and nasopharynx are unremarkable. The oral  cavity and oropharynx are unremarkable. The parapharyngeal spaces are clear. The hypopharynx and larynx are unremarkable. The vocal folds are unremarkable in appearance. There is no retropharyngeal fluid collection. There is no abnormal mass lesion or enhancement. Salivary glands: The parotid glands are unremarkable. There is unchanged atrophy of the right submandibular gland. The left submandibular gland is unremarkable. Thyroid: Unremarkable. Lymph nodes: There is no pathologic lymphadenopathy in the neck. Vascular: There is mild calcified atherosclerotic plaque of the bilateral carotid bifurcations. The major vasculature in the neck is otherwise unremarkable. Limited intracranial: The imaged portions of the intracranial compartment is unremarkable. Visualized  orbits: Bilateral lens implants are in place. The globes and orbits are otherwise unremarkable. Mastoids and visualized paranasal sinuses: There is opacification of a right posterior ethmoid air cell. The other imaged paranasal sinuses and mastoid air cells are clear. Skeleton: There is mild multilevel degenerative change of the cervical spine, most advanced at C5-C6. Upper chest: The imaged lung apices are clear. Other: Surgical clips are again noted in the left carotid space. There is no new or enlarging soft tissue mass in the location to suggest local lesion recurrence. There is no other abnormal soft tissue mass in the neck. IMPRESSION: Stable exam without evidence of residual or recurrent carotid body tumor. Electronically Signed   By: Valetta Mole M.D.   On: 04/06/2022 15:58     ASSESSMENT:  1.  Paraganglioma of the left carotid artery: -Left carotid body biopsy on 10/27/2017 shows paraganglioma.  Status was not mentioned. -Work-up with 24-hour urine metanephrines and normetanephrine's were within normal limits. -Genetic testing for SDH and BHL was negative.  It showed VUS in KIT and STK11. -CT soft tissue neck on 03/27/2020 did not show any  evidence of recurrence or residual carotid body tumor.   PLAN:  1.  Paraganglioma of the left carotid artery: -Physical examination was benign.  She does not have any new complaints. - Reviewed labs from 04/06/2022 which showed normal CBC.  TSH was normal. - CT soft tissue neck (04/06/2022): Stable without evidence of recurrence. - Recommend follow-up in 1 year with repeat CT scan for the last time.   2.  CKD: - Latest creatinine is 1.21 and stable.  Avoid nephrotoxins.   Orders placed this encounter:  No orders of the defined types were placed in this encounter.    Derek Jack, MD [place] [phone]   I, Thana Ates, am acting as a scribe for Dr. Derek Jack.  I, Derek Jack MD, have reviewed the above documentation for accuracy and completeness, and I agree with the above.

## 2022-04-13 ENCOUNTER — Encounter: Payer: Self-pay | Admitting: Physical Therapy

## 2022-04-13 ENCOUNTER — Ambulatory Visit: Payer: Medicare HMO | Admitting: Physical Therapy

## 2022-04-13 DIAGNOSIS — R262 Difficulty in walking, not elsewhere classified: Secondary | ICD-10-CM

## 2022-04-13 DIAGNOSIS — M5459 Other low back pain: Secondary | ICD-10-CM

## 2022-04-13 NOTE — Therapy (Addendum)
OUTPATIENT PHYSICAL THERAPY TREATMENT NOTE   Patient Name: BENTLEY HARALSON MRN: [id number] DOB:April 01, 1941, 81 y.o., female Today's Date: 04/13/2022  PCP: Dr. Waunita Schooner REFERRING PROVIDER: Dr. Waunita Schooner   END OF SESSION:   PT End of Session - 04/13/22 1236     Visit Number 6    Number of Visits 16    Date for PT Re-Evaluation 04/26/22    Authorization Type [employer]    PT Start Time 7253    PT Stop Time 1315    PT Time Calculation (min) 45 min    Activity Tolerance Patient tolerated treatment well    Behavior During Therapy [facility] for tasks assessed/performed             Past Medical History:  Diagnosis Date   Anemia    Carotid body tumor ([place])    left   Family history of colon cancer    Hypertension    Hypothyroid    Pyelonephritis    Rectal bleeding 11/26/2015   Vertigo    Vitamin B 12 deficiency    Past Surgical History:  Procedure Laterality Date   ABDOMINAL HYSTERECTOMY     CATARACT EXTRACTION W/ INTRAOCULAR LENS  IMPLANT, BILATERAL     CHOLECYSTECTOMY N/A 04/15/2015   Procedure: LAPAROSCOPIC CHOLECYSTECTOMY;  Surgeon: Aviva Signs, MD;  Location: [facility];  Service: General;  Laterality: N/A;   COLONOSCOPY  2012   [place]   COLONOSCOPY WITH PROPOFOL N/A 02/03/2016   Procedure: COLONOSCOPY WITH PROPOFOL;  Surgeon: Robert Bellow, MD;  Location: [facility];  Service: Endoscopy;  Laterality: N/A;   EYE SURGERY Bilateral    cataract extractions   HEMORRHOID SURGERY N/A 08/31/2018   Procedure: HEMORRHOIDECTOMY;  Surgeon: Robert Bellow, MD;  Location: [facility];  Service: General;  Laterality: N/A;   IR ANGIO EXTERNAL CAROTID SEL EXT CAROTID UNI L MOD SED  10/26/2017   IR ANGIO INTRA EXTRACRAN SEL COM CAROTID INNOMINATE BILAT MOD SED  10/26/2017   IR ANGIO VERTEBRAL SEL VERTEBRAL UNI R MOD SED  10/26/2017   IR ANGIOGRAM EXTREMITY LEFT  10/26/2017   IR ANGIOGRAM FOLLOW UP STUDY  10/26/2017   IR NEURO EACH ADD'L AFTER BASIC UNI LEFT (MS)  10/26/2017   IR  RADIOLOGIST EVAL & MGMT  09/21/2017   IR TRANSCATH/EMBOLIZ  10/26/2017   MASS EXCISION Left 10/27/2017   Procedure: EXCISION CAROTID BODY TUMOR;  Surgeon: Melida Quitter, MD;  Location: [place];  Service: ENT;  Laterality: Left;   RADIOLOGY WITH ANESTHESIA N/A 10/26/2017   Procedure: EMBOLIZATION;  Surgeon: Luanne Bras, MD;  Location: [place];  Service: Radiology;  Laterality: N/A;   SIGMOIDOSCOPY N/A 08/31/2018   Procedure: SIGMOIDOSCOPY-IN  OR;  Surgeon: Robert Bellow, MD;  Location: [facility];  Service: General;  Laterality: N/A;   UPPER GASTROINTESTINAL ENDOSCOPY  2014   VESICOVAGINAL FISTULA CLOSURE W/ TAH     Patient Active Problem List   Diagnosis Date Noted   Bilateral leg edema 04/04/2022   Localized swelling of right lower leg 02/09/2022   Dermatitis 01/12/2022   Acute right-sided low back pain without sciatica 12/22/2021   Osteopenia 12/22/2021   Viral URI with cough 09/23/2021   UTI (urinary tract infection) 04/07/2021   Hot flashes 01/14/2021   Decreased hearing of left ear 01/14/2021   Acute left ankle pain 06/22/2020   Prediabetes 04/09/2020   Vaginal itching 04/09/2020   Polycystic liver disease 10/24/2019   Coronary atherosclerosis due to calcified coronary lesion of native artery 10/24/2019  Counseling for estrogen replacement therapy 10/15/2019   Chronic cough 10/15/2019   Chronic kidney disease, stage 3a ([place]) 10/15/2019   Genetic testing 02/06/2018   Family history of colon cancer    [place]) 10/26/2017   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 Verda Cumins Note    This is a chronic problem. The current episode started in the past 7 days. The pain is present in the lumbar spine and gluteal. The quality of the pain is described as aching. The pain radiates to the right thigh and right knee. The pain is at a severity of 8/10. Exacerbated by: moving around. Pertinent negatives include no bladder incontinence, bowel incontinence, fever, numbness, tingling or weakness.    Steroids on 01/12/2022 - with improvement in pain, did have some increased hunger   Has been doing the home handout of exercises   Did resolve with steroids After about 1 week symptoms returned  PRECAUTIONS: None   SUBJECTIVE: Pt reports minimal back and hip pain since last session and that she has been doing exercises without much difficulty.  PAIN:  Are you having pain? Yes: NPRS scale: 3/10 Pain location: Right sided low back pain that radiates down right leg  Pain description: Achy  Aggravating factors: Walking long distances  Relieving factors: Tylenol arthritis    OBJECTIVE:                VITALS: BP 139/63 HR 60 SpO2 100   DIAGNOSTIC FINDINGS:  CLINICAL DATA:  Low back pain, symptoms persist with greater than 6 weeks of treatment. Right-sided pain shooting to the right leg.   EXAM: MRI LUMBAR SPINE WITHOUT CONTRAST   TECHNIQUE: Multiplanar, multisequence MR imaging of the lumbar spine was performed. No intravenous contrast was administered.   COMPARISON:  Radiography 01/12/2022   FINDINGS: Segmentation:  5 lumbar type vertebral bodies.   Alignment:  Normal   Vertebrae: Marrow space lesions affecting T12, L1, L4 and L5, most extensive at L4. I think these most likely represent atypical hemangiomas. However, the possibility of osseous metastatic disease is not completely ruled out.   Conus medullaris and cauda equina: Conus extends to the L1  level. Conus and cauda equina appear normal.   Paraspinal and other soft tissues: Negative   Disc levels:   T11-12 through L1-2: Normal   L2-3: Minimal disc bulge.  Minimal facet hypertrophy.  No stenosis.   L3-4: Mild bulging of the disc. Mild facet hypertrophy. No stenosis.   L4-5: Mild bulging of the disc. Mild facet and ligamentous hypertrophy. No compressive stenosis.   L5-S1: Shallow protrusion of the disc. Mild indentation of the thecal sac. Mild narrowing of the lateral recesses and foramina. Foraminal encroachment is more pronounced on the right, where the right L5 nerve could be affected.   IMPRESSION: L5-S1: Shallow disc protrusion. Mild indentation of the thecal sac with mild stenosis of the subarticular lateral recesses and neural foramina. Foraminal encroachment is more pronounced on the right, where the right L5 nerve could be irritated.   Mild, non-compressive degenerative changes from  L2-3 through L4-5 as described above.   Marrow space signal abnormalities at T12, L1, L4 and L5. I think these probably represent atypical hemangiomas. The possibility of metastatic disease does exist but is not favored. Does the patient have any known malignancy?   PATIENT SURVEYS:  FOTO 59/72   SCREENING FOR RED FLAGS: Bowel or bladder incontinence: No Spinal tumors: No Cauda equina syndrome: No Compression fracture: No Abdominal aneurysm: No   COGNITION:           Overall cognitive status: Within functional limits for tasks assessed                          SENSATION: Numbness and tingling on all of her toes    MUSCLE LENGTH: Hamstrings: Right 70 deg; Left 70 deg Thomas test: Right NT deg; Left NT  deg   POSTURE: No Significant postural limitations   PALPATION: Right glute med    LUMBAR ROM:    Active  A/PROM  eval  Flexion 90%  Extension 100%  Right lateral flexion 100%  Left lateral flexion 100%  Right rotation 100%  Left rotation 100%   (Blank  rows = not tested)   LOWER EXTREMITY ROM:          Active  Right 02/28/2022 Left 02/28/2022  Hip flexion 120 120  Hip extension 30 30  Hip abduction 45 45  Hip adduction 30 30  Hip internal rotation 45 45  Hip external rotation 45 45  Knee flexion 135 135  Knee extension 0 0  Ankle dorsiflexion 20 20  Ankle plantarflexion 50 50  Ankle inversion 35 35  Ankle eversion 15 15   (Blank rows = not tested)        LOWER EXTREMITY MMT:     MMT Right eval Left eval 03/07/22 03/07/22  Hip flexion 5 5    Hip extension 4 4    Hip abduction 4 4    Hip adduction 4 4    Hip internal rotation     5 5  Hip external rotation     5 5  Knee flexion 5 5    Knee extension 5 5    Ankle dorsiflexion 5 5    Ankle plantarflexion  5 5     Ankle inversion        Ankle eversion         (Blank rows = not tested)   LUMBAR SPECIAL TESTS:  Straight leg raise test: Negative, Slump test: Negative, FABER test: Negative, and Thomas test: Negative FADIR Negative    FUNCTIONAL TESTS:  None    GAIT: Distance walked: 25 ft  Assistive device utilized: None Level of assistance: Complete Independence Comments: No deficits noted        TODAY'S TREATMENT   04/13/22        Nu-Step level 7 for set and resistance 2 for 5 min         Mini-Squats with 1 UE support 3 x 10         -min VC to increase BOS         Standing Hip Abduction with 1 UE support 3 x 10         Standing Heel Raises 1 x 10         Standing Heel Raises 1 x 10 with #8 jug of water         Standing Heel Raies 1 x 10 with 2  x #8 DB         Abdominal Crunches with arms behind head 3 x 10         Matrix Standing Hip Adduction with #25 2 x 10    04/05/22         TM 1 mph for 5 min with BUE support           Hip MMT:              Abduction R/L 4+/4+              Adduction R/L 4/4+             Ext            R/L 4-/4-                   FOTO 67/100          Mini-Squats with BUE support 3 x 10             -TC of mat raised for pt  to perform butt tap                    03/23/22          Nu-Step Seat and Arms 7 and resistance at 3 for 5 min             Standing Hip Abduction with BUE support 1 x 10             Standing Hip Abduction with BUE support 1 x 10 with YTB           -Pt reports increased pain in the back of her knee           Clam Shell with Red TB 3 x 10           -mod VC to maintain foot contact           Side Lying Hip Adduction 2 x 10           -mod VC and TC              03/15/22           Nu-Step Seat and Arms 7 and resistance at 3 for 5 min                     Hook Lying  Abdominal Reaches 1 x 10            Hook Lying Crunches with hands behind head 3 x 10            - min VC to lift shoulder blades off of the mat            Side Lying Hip Abduction with Red TB 3 x 10             Prone Quad Stretch 4 x 30 sec               PATIENT EDUCATION:  Education details: form and technique for appropriate exercise and explanation of underlying pathology  Person educated: Patient and Child(ren) Education method: Explanation, Demonstration, Verbal cues, and Handouts Education comprehension: verbalized understanding, verbal cues required, and tactile cues required     HOME EXERCISE PROGRAM: Access Code: [email or web address] URL: [email or web address] Date: 04/13/2022 Prepared by: Bradly Chris  Exercises - Prone Quadriceps Stretch with Strap  - 1 x daily - 7 x weekly - 1 sets - 3 reps -  60 hold - Ab Prep  - 1 x daily - 3 x weekly - 3 sets - 10 reps - Seated Table Hamstring Stretch  - 1 x daily - 7 x weekly - 1 sets - 3 reps - 60 hold - Sidelying Hip Adduction  - 1 x daily - 3 x weekly - 3 sets - 10 reps - Mini Squat with Counter Support  - 1 x daily - 3 x weekly - 3 sets - 10 reps - Standing Hip Abduction with Counter Support  - 1 x daily - 3 x weekly - 3 sets - 10 reps - Standing Heel Raise  - 1 x daily - 3 x weekly - 3 sets - 10 reps  ASSESSMENT:   CLINICAL IMPRESSION:  Pt shows an  improvement in LE strength and static balance with ability to perform standing strengthening exercises with decreased UE support. She did not have an increase in her pain after completing the exercises and needed min VC to perform exercises correctly. She will continue to benefit from skilled PT to resolve these deficits to return to walking and to maintain aerobic fitness.   OBJECTIVE IMPAIRMENTS difficulty walking, decreased strength, and pain.    ACTIVITY LIMITATIONS lifting, bending, and caring for others and walking    PARTICIPATION LIMITATIONS: shopping and community activity   PERSONAL FACTORS Age are also affecting patient's functional outcome.    REHAB POTENTIAL: Good   CLINICAL DECISION MAKING: Stable/uncomplicated   EVALUATION COMPLEXITY: Low     GOALS: Goals reviewed with patient? No   SHORT TERM GOALS: Target date: 03/20/2022   Pt will be independent with HEP in order to improve strength and balance in order to decrease fall risk and improve function at home and work. Baseline: NT  Goal status: ongoing      LONG TERM GOALS: Target date: 05/01/2022   Patient will have improved function and activity level as evidenced by an increase in FOTO score by 10 points or more.  Baseline: 59/100 with target 72 04/05/22: 67/72 Goal status: PARTIALLY MET    2.  Patient will improve hip strength by >=1 MMT to decrease stress on surrounding spinal structures for improved symptom response and functional outcomes.  Baseline: Hip Adduction R/L 4/4 Hip Abduction R/L 4/4 Hip Ext R/L 4/4 04/05/22:    Abd R/L 4+/4+      Add R/L 4/4+, Ext R/L 4-/4-          Goal status: PARTIALLY MET    3.  Pt will increase 6MWT by at least 62m(1657f in order to demonstrate clinically significant improvement in cardiopulmonary endurance and community ambulation. Baseline:  03/07/22: 1,700 Goal status: Deferred   PLAN: PT FREQUENCY: 1-2x/week   PT DURATION: 8 weeks   PLANNED INTERVENTIONS: Therapeutic  exercises, Therapeutic activity, Neuromuscular re-education, Balance training, Gait training, Patient/Family education, Joint manipulation, Joint mobilization, Stair training, Dry Needling, Spinal manipulation, Spinal mobilization, Cryotherapy, Moist heat, Traction, Manual therapy, and Re-evaluation.   PLAN FOR NEXT SESSION:   Reassess goals for re-cert. Progress Hip and Abdominal Strengthening exercises with adjustment of sets and reps to endurance.    DaBradly ChrisT, DPT  04/13/2022, 1:23 PM

## 2022-04-18 ENCOUNTER — Ambulatory Visit: Payer: Medicare HMO | Admitting: Physical Therapy

## 2022-04-20 ENCOUNTER — Encounter: Payer: Self-pay | Admitting: Nurse Practitioner

## 2022-04-20 ENCOUNTER — Ambulatory Visit (INDEPENDENT_AMBULATORY_CARE_PROVIDER_SITE_OTHER): Payer: Medicare HMO | Admitting: Nurse Practitioner

## 2022-04-20 VITALS — BP 128/60 | HR 67 | Temp 97.6°F | Resp 14 | Wt 150.0 lb

## 2022-04-20 DIAGNOSIS — R0789 Other chest pain: Secondary | ICD-10-CM | POA: Insufficient documentation

## 2022-04-20 DIAGNOSIS — R0982 Postnasal drip: Secondary | ICD-10-CM | POA: Diagnosis not present

## 2022-04-20 DIAGNOSIS — R051 Acute cough: Secondary | ICD-10-CM | POA: Insufficient documentation

## 2022-04-20 DIAGNOSIS — J069 Acute upper respiratory infection, unspecified: Secondary | ICD-10-CM | POA: Diagnosis not present

## 2022-04-20 MED ORDER — HYDROCODONE BIT-HOMATROP MBR 5-1.5 MG/5ML PO SOLN: 5.0000 mL | Freq: Every evening | ORAL | 0 refills | Status: DC | PRN

## 2022-04-20 NOTE — Progress Notes (Signed)
Acute Office Visit  Subjective:     Patient ID: Heather Cobb, female    DOB: 1941-07-01, 81 y.o.   MRN: [id number]  Chief Complaint  Patient presents with   Cough    Started yesterday 04/19/22, coughed last night a lot to the point her chest hurts. Scratchy throat. A little wheezing present.      Patient is in today for Cough  States that it started yesterday (04/19/2022) No sick contacts States that she was at church under the cold fan.  Then her daughter had colonoscopy on Monday and she was exposed to cold air. Then her daughter had surgery yesterday and patient reports being exposed to cold air. Vaccinated against covid No covid test yet States that she took some dayquill and it did help.  States that she used inhaler, and it helped some  Review of Systems  Constitutional:  Negative for chills, fever and malaise/fatigue.  HENT:  Negative for ear discharge, ear pain, sinus pain and sore throat.   Respiratory:  Positive for cough (intermittent productive). Negative for shortness of breath.        Chest tightness   Musculoskeletal:  Negative for joint pain and myalgias.  Neurological:  Negative for headaches.        Objective:    BP 128/60   Pulse 67   Temp 97.6 F (36.4 C)   Resp 14   Wt 150 lb (68 kg)   SpO2 100%   BMI 26.15 kg/m    Physical Exam Vitals reviewed.  Constitutional:      Appearance: Normal appearance.  HENT:     Right Ear: Tympanic membrane, ear canal and external ear normal.     Left Ear: Tympanic membrane, ear canal and external ear normal.     Nose:     Right Sinus: No maxillary sinus tenderness or frontal sinus tenderness.     Left Sinus: No maxillary sinus tenderness or frontal sinus tenderness.     Mouth/Throat:     Mouth: Mucous membranes are moist.     Pharynx: Oropharynx is clear.  Cardiovascular:     Rate and Rhythm: Normal rate and regular rhythm.     Heart sounds: Normal heart sounds.  Pulmonary:     Effort: Pulmonary  effort is normal.     Breath sounds: Normal breath sounds.  Lymphadenopathy:     Cervical: No cervical adenopathy.  Neurological:     Mental Status: She is alert.     No results found for any visits on 04/20/22.      Assessment & Plan:   Problem List Items Addressed This Visit       Respiratory   Viral upper respiratory tract infection - Primary    Given symptoms being 1 day likely viral in nature.  Did recommend doing at home COVID test in 2 days to make sure is not COVID, too early to test in office today.  Symptomatic treatment for now follow-up if no improvement        Other   Acute cough    Acute on chronic cough.  Patient currently maintained on Tessalon Perles without great relief.  We will write some hydrocodone cough syrup as patient has allergy to codeine and states she is tolerated this in the past.  Did give sedation precautions and dizziness precautions in case patient has to get up during the nighttime to use the restroom.      Relevant Medications   HYDROcodone bit-homatropine (HYCODAN) 5-1.5  MG/5ML syrup   PND (post-nasal drip)    Patient currently on Nasonex.  Continue nasal spray as prescribed and directed      Chest tightness    Meds ordered this encounter  Medications   HYDROcodone bit-homatropine (HYCODAN) 5-1.5 MG/5ML syrup    Sig: Take 5 mLs by mouth at bedtime as needed for cough.    Dispense:  50 mL    Refill:  0    Order Specific Question:   Supervising Provider    Answer:   Loura Pardon A [[id number]]    Return if symptoms worsen or fail to improve.  Romilda Garret, NP

## 2022-04-20 NOTE — Patient Instructions (Signed)
Nice to see you today I sent cough medication to your pharmacy Follow up if you do not improve This should start getting better in about a week

## 2022-04-20 NOTE — Assessment & Plan Note (Signed)
Acute on chronic cough.  Patient currently maintained on Tessalon Perles without great relief.  We will write some hydrocodone cough syrup as patient has allergy to codeine and states she is tolerated this in the past.  Did give sedation precautions and dizziness precautions in case patient has to get up during the nighttime to use the restroom.

## 2022-04-20 NOTE — Assessment & Plan Note (Signed)
Given symptoms being 1 day likely viral in nature.  Did recommend doing at home COVID test in 2 days to make sure is not COVID, too early to test in office today.  Symptomatic treatment for now follow-up if no improvement

## 2022-04-20 NOTE — Assessment & Plan Note (Signed)
Patient currently on Nasonex.  Continue nasal spray as prescribed and directed

## 2022-04-22 ENCOUNTER — Ambulatory Visit (INDEPENDENT_AMBULATORY_CARE_PROVIDER_SITE_OTHER): Payer: Medicare HMO | Admitting: Family

## 2022-04-22 ENCOUNTER — Encounter: Payer: Self-pay | Admitting: Family

## 2022-04-22 VITALS — BP 148/62 | HR 52 | Temp 98.6°F | Resp 16

## 2022-04-22 DIAGNOSIS — Z20822 Contact with and (suspected) exposure to covid-19: Secondary | ICD-10-CM | POA: Diagnosis not present

## 2022-04-22 DIAGNOSIS — U071 COVID-19: Secondary | ICD-10-CM

## 2022-04-22 DIAGNOSIS — R051 Acute cough: Secondary | ICD-10-CM

## 2022-04-22 DIAGNOSIS — R053 Chronic cough: Secondary | ICD-10-CM | POA: Diagnosis not present

## 2022-04-22 LAB — POC COVID19 BINAXNOW: SARS Coronavirus 2 Ag: POSITIVE — AB

## 2022-04-22 MED ORDER — MOLNUPIRAVIR EUA 200MG CAPSULE: 4.0000 | ORAL_CAPSULE | Freq: Two times a day (BID) | ORAL | 0 refills | Status: AC

## 2022-04-22 NOTE — Assessment & Plan Note (Signed)
Pt considered high risk for hospitalization. have decided pt is a candidate for antiviral and pt agrees that she would like to take this. I have sent in RX for molnupiravir 200 mg capsules to be taken as directed. ?Advised of CDC guidelines for self isolation/ ending isolation.  Advised of safe practice guidelines. Symptom Tier reviewed.  Encouraged to monitor for any worsening symptoms; watch for increased shortness of breath, weakness, and signs of dehydration. Advised when to seek emergency care.  Instructed to rest and hydrate well.  Advised to leave the house during recommended isolation period, only if it is necessary to seek medical care ? ? ? ?

## 2022-04-22 NOTE — Progress Notes (Signed)
Established Patient Office Visit  Subjective:  Patient ID: Heather Cobb, female    DOB: [birth year]  Age: 81 y.o. MRN: [id number]  CC:  Chief Complaint  Patient presents with   Covid Positive    HPI Heather Cobb is here today with concerns.   Four days ago started with cough chest congestion.  No nasal congestion. No ear pain no sore throat. Duagther with her today who just tested positive for covid.   No fever.  Taking steroid and also cough medication at night which has been really helping. Wanting to test for covid.   Past Medical History:  Diagnosis Date   Anemia    Carotid body tumor ([place])    left   Family history of colon cancer    Hypertension    Hypothyroid    Pyelonephritis    Rectal bleeding 11/26/2015   Vertigo    Vitamin B 12 deficiency     Past Surgical History:  Procedure Laterality Date   ABDOMINAL HYSTERECTOMY     CATARACT EXTRACTION W/ INTRAOCULAR LENS  IMPLANT, BILATERAL     CHOLECYSTECTOMY N/A 04/15/2015   Procedure: LAPAROSCOPIC CHOLECYSTECTOMY;  Surgeon: Aviva Signs, MD;  Location: [facility];  Service: General;  Laterality: N/A;   COLONOSCOPY  2012   [place]   COLONOSCOPY WITH PROPOFOL N/A 02/03/2016   Procedure: COLONOSCOPY WITH PROPOFOL;  Surgeon: Robert Bellow, MD;  Location: [facility];  Service: Endoscopy;  Laterality: N/A;   EYE SURGERY Bilateral    cataract extractions   HEMORRHOID SURGERY N/A 08/31/2018   Procedure: HEMORRHOIDECTOMY;  Surgeon: Robert Bellow, MD;  Location: [facility];  Service: General;  Laterality: N/A;   IR ANGIO EXTERNAL CAROTID SEL EXT CAROTID UNI L MOD SED  10/26/2017   IR ANGIO INTRA EXTRACRAN SEL COM CAROTID INNOMINATE BILAT MOD SED  10/26/2017   IR ANGIO VERTEBRAL SEL VERTEBRAL UNI R MOD SED  10/26/2017   IR ANGIOGRAM EXTREMITY LEFT  10/26/2017   IR ANGIOGRAM FOLLOW UP STUDY  10/26/2017   IR NEURO EACH ADD'L AFTER BASIC UNI LEFT (MS)  10/26/2017   IR RADIOLOGIST EVAL & MGMT  09/21/2017   IR TRANSCATH/EMBOLIZ   10/26/2017   MASS EXCISION Left 10/27/2017   Procedure: EXCISION CAROTID BODY TUMOR;  Surgeon: Melida Quitter, MD;  Location: [place];  Service: ENT;  Laterality: Left;   RADIOLOGY WITH ANESTHESIA N/A 10/26/2017   Procedure: EMBOLIZATION;  Surgeon: Luanne Bras, MD;  Location: [place];  Service: Radiology;  Laterality: N/A;   SIGMOIDOSCOPY N/A 08/31/2018   Procedure: SIGMOIDOSCOPY-IN  OR;  Surgeon: Robert Bellow, MD;  Location: [facility];  Service: General;  Laterality: N/A;   UPPER GASTROINTESTINAL ENDOSCOPY  2014   VESICOVAGINAL FISTULA CLOSURE W/ TAH      Family History  Problem Relation Age of Onset   Asthma Mother    Kidney failure Mother    Hypertension Daughter    Colon cancer Father        dx in his 74s   Stomach cancer Sister        stage 4   Colon cancer Maternal Uncle    Heart attack Maternal Grandmother    Asthma Brother    Cancer Neg Hx     Social History   Socioeconomic History   Marital status: Widowed    Spouse name: Not on file   Number of children: 1   Years of education: high school   Highest education level: Not on file  Occupational  History   Occupation: Engineer, manufacturing systems    Comment: retired  Tobacco Use   Smoking status: Never   Smokeless tobacco: Never  Vaping Use   Vaping Use: Never used  Substance and Sexual Activity   Alcohol use: No    Alcohol/week: 0.0 standard drinks of alcohol   Drug use: No   Sexual activity: Not Currently    Birth control/protection: Surgical  Other Topics Concern   Not on file  Social History Narrative   10/15/19   From: the area   Living: with Daugher [name]) and son-in-law   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, 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      Family: lives with daughter, has 1 grandson and 3 great-grandchildren and 1 great-great-grandchild      Enjoys: eat, dance, travel, cooking      Exercise: walk - not as much in cold, will do 1 mile/day   Diet: good, tries to eat health      Safety   Seat belts: Yes     Guns: Yes  and secure   Safe in relationships: Yes    Social Determinants of Health   Financial Resource Strain: Low Risk  (03/01/2022)   Overall Financial Resource Strain (CARDIA)    Difficulty of Paying Living Expenses: Not hard at all  Food Insecurity: No Food Insecurity (03/01/2022)   Hunger Vital Sign    Worried About Running Out of Food in the Last Year: Never true    [place] in the Last Year: Never true  Transportation Needs: No Transportation Needs (03/01/2022)   PRAPARE - Hydrologist (Medical): No    Lack of Transportation (Non-Medical): No  Physical Activity: Insufficiently Active (03/01/2022)   Exercise Vital Sign    Days of Exercise per Week: 2 days    Minutes of Exercise per Session: 30 min  Stress: No Stress Concern Present (03/01/2022)   [place]    Feeling of Stress : Not at all  Social Connections: Moderately Integrated (03/01/2022)   Social Connection and Isolation Panel [NHANES]    Frequency of Communication with Friends and Family: More than three times a week    Frequency of Social Gatherings with Friends and Family: Three times a week    Attends Religious Services: More than 4 times per year    Active Member of Clubs or Organizations: Yes    Attends Archivist Meetings: More than 4 times per year    Marital Status: Widowed  Intimate Partner Violence: Not At Risk (03/01/2022)   Humiliation, Afraid, Rape, and Kick questionnaire    Fear of Current or Ex-Partner: No    Emotionally Abused: No    Physically Abused: No    Sexually Abused: No    Outpatient Medications Prior to Visit  Medication Sig Dispense Refill   acetaminophen (TYLENOL) 325 MG tablet Take 650 mg by mouth every 6 (six) hours as needed.     albuterol (VENTOLIN HFA) 108 (90 Base) MCG/ACT inhaler Inhale 2 puffs into the lungs every 6 (six) hours as needed for wheezing or shortness of  breath. 18 g 11   amLODipine (NORVASC) 5 MG tablet Take 5 mg by mouth 2 (two) times daily.     aspirin EC 81 MG tablet Take 81 mg by mouth daily.     benzonatate (TESSALON PERLES) 100 MG capsule Take 1 capsule (100 mg total) by mouth 2 (two) times  daily. 180 capsule 1   cholecalciferol (VITAMIN D) 1000 UNITS tablet Take 1,000 Units by mouth daily.     clotrimazole-betamethasone (LOTRISONE) cream APPLY TOPICALLY TO THE AFFECTED AREA TWICE DAILY 30 g 3   ferrous sulfate 325 (65 FE) MG tablet Take 325 mg by mouth daily with breakfast.     gabapentin (NEURONTIN) 100 MG capsule Take 1 capsule (100 mg total) by mouth at bedtime. 30 capsule 0   HYDROcodone bit-homatropine (HYCODAN) 5-1.5 MG/5ML syrup Take 5 mLs by mouth at bedtime as needed for cough. 50 mL 0   levalbuterol (XOPENEX HFA) 45 MCG/ACT inhaler Inhale 1-2 puffs into the lungs every 4 (four) hours as needed for wheezing. 1 Inhaler 0   levothyroxine (SYNTHROID) 75 MCG tablet Take 1 tablet (75 mcg total) by mouth daily before breakfast. 90 tablet 1   linaclotide (LINZESS) 145 MCG CAPS capsule Take 1 capsule (145 mcg total) by mouth daily as needed (for constipation). 90 capsule 3   mometasone (NASONEX) 50 MCG/ACT nasal spray Place 2 sprays into the nose daily. (Patient taking differently: Place 2 sprays into the nose as needed.) 1 g 0   olmesartan (BENICAR) 40 MG tablet TAKE 1 TABLET(40 MG) BY MOUTH DAILY 90 tablet 3   pantoprazole (PROTONIX) 40 MG tablet TAKE 1 TABLET(40 MG) BY MOUTH DAILY 90 tablet 3   spironolactone (ALDACTONE) 25 MG tablet Take 1 tablet (25 mg total) by mouth daily. For blood pressure. 90 tablet 1   vitamin B-12 (CYANOCOBALAMIN) 1000 MCG tablet Take 1,000 mcg by mouth daily.      No facility-administered medications prior to visit.    Allergies  Allergen Reactions   Codeine Nausea Only and Other (See Comments)    fever fever fever        Objective:    Physical Exam Constitutional:      General: She is not in  acute distress.    Appearance: Normal appearance. She is normal weight. She is not ill-appearing, toxic-appearing or diaphoretic.  HENT:     Right Ear: Tympanic membrane normal.     Left Ear: Tympanic membrane normal.     Nose: Nose normal. No congestion or rhinorrhea.     Right Turbinates: Not enlarged or swollen.     Left Turbinates: Not enlarged or swollen.     Right Sinus: No maxillary sinus tenderness or frontal sinus tenderness.     Left Sinus: No maxillary sinus tenderness or frontal sinus tenderness.     Mouth/Throat:     Mouth: Mucous membranes are moist.     Pharynx: No pharyngeal swelling, oropharyngeal exudate or posterior oropharyngeal erythema.     Tonsils: No tonsillar exudate.  Eyes:     Extraocular Movements: Extraocular movements intact.     Conjunctiva/sclera: Conjunctivae normal.     Pupils: Pupils are equal, round, and reactive to light.  Neck:     Thyroid: No thyroid mass.  Cardiovascular:     Rate and Rhythm: Normal rate and regular rhythm.  Pulmonary:     Effort: Pulmonary effort is normal.     Breath sounds: Normal breath sounds.  Lymphadenopathy:     Cervical:     Right cervical: No superficial cervical adenopathy.    Left cervical: No superficial cervical adenopathy.  Neurological:     Mental Status: She is alert.     BP (!) 148/62   Pulse (!) 52   Temp 98.6 F (37 C)   Resp 16   SpO2 97%  Wt Readings  from Last 3 Encounters:  04/20/22 150 lb (68 kg)  04/12/22 150 lb (68 kg)  04/04/22 150 lb 8 oz (68.3 kg)     Health Maintenance Due  Topic Date Due   COVID-19 Vaccine (4 - Pfizer series) 08/31/2020   INFLUENZA VACCINE  04/19/2022    There are no preventive care reminders to display for this patient.  Lab Results  Component Value Date   TSH 1.47 02/23/2022   Lab Results  Component Value Date   WBC 4.8 04/06/2022   HGB 12.1 04/06/2022   HCT 37.1 04/06/2022   MCV 90.7 04/06/2022   PLT 219 04/06/2022   Lab Results  Component  Value Date   NA 141 02/23/2022   K 3.8 02/23/2022   CO2 29 02/23/2022   GLUCOSE 117 (H) 02/23/2022   BUN 16 02/23/2022   CREATININE 1.21 (H) 02/23/2022   BILITOT 0.9 03/31/2021   ALKPHOS 66 03/31/2021   AST 16 03/31/2021   ALT 13 03/31/2021   PROT 7.6 03/31/2021   ALBUMIN 4.1 03/31/2021   CALCIUM 10.4 02/23/2022   ANIONGAP 10 03/31/2021   GFR 42.11 (L) 02/23/2022   Lab Results  Component Value Date   HGBA1C 5.8 (A) 04/09/2020      Assessment & Plan:   Problem List Items Addressed This Visit       Other   Acute cough    Continue cough medication       COVID-19    Pt considered high risk for hospitalization. have decided pt is a candidate for antiviral and pt agrees that she would like to take this. I have sent in RX for molnupiravir 200 mg capsules to be taken as directed.  Advised of CDC guidelines for self isolation/ ending isolation.  Advised of safe practice guidelines. Symptom Tier reviewed.  Encouraged to monitor for any worsening symptoms; watch for increased shortness of breath, weakness, and signs of dehydration. Advised when to seek emergency care.  Instructed to rest and hydrate well.  Advised to leave the house during recommended isolation period, only if it is necessary to seek medical care        Relevant Medications   molnupiravir EUA (LAGEVRIO) 200 mg CAPS capsule   Exposure to COVID-19 virus    covid tested in office , positive      RESOLVED: Chronic cough - Primary   Relevant Orders   POC COVID-19 BinaxNow (Completed)    Meds ordered this encounter  Medications   molnupiravir EUA (LAGEVRIO) 200 mg CAPS capsule    Sig: Take 4 capsules (800 mg total) by mouth 2 (two) times daily for 5 days.    Dispense:  40 capsule    Refill:  0    Order Specific Question:   Supervising Provider    Answer:   BEDSOLE, AMY E [[id number]]    Follow-up: Return if symptoms worsen or fail to improve with pcp.    Eugenia Pancoast, FNP

## 2022-04-22 NOTE — Patient Instructions (Signed)
Your COVID test is positive. You should remain isolated and quarantine for at least 5 days from start of symptoms. You must be feeling better and be fever free without any fever reducers for at least 24 hours as well. You should wear a mask at all times when out of your home or around others for 5 days after leaving isolation.  Your household contacts should be tested as well as work contacts. If you feel worse or have increasing shortness of breath, you should be seen in person at urgent care or the emergency room.   Due to recent changes in healthcare laws, you may see results of your imaging and/or laboratory studies on MyChart before I have had a chance to review them.  I understand that in some cases there may be results that are confusing or concerning to you. Please understand that not all results are received at the same time and often I may need to interpret multiple results in order to provide you with the best plan of care or course of treatment. Therefore, I ask that you please give me 2 business days to thoroughly review all your results before contacting my office for clarification. Should we see a critical lab result, you will be contacted sooner.   It was a pleasure seeing you today! Please do not hesitate to reach out with any questions and or concerns.  Regards,   Rhyann Berton FNP-C   

## 2022-04-22 NOTE — Assessment & Plan Note (Signed)
Continue cough medication

## 2022-04-22 NOTE — Progress Notes (Signed)
D/w pt and treated in office

## 2022-04-22 NOTE — Assessment & Plan Note (Signed)
covid tested in office , positive

## 2022-04-25 ENCOUNTER — Ambulatory Visit: Payer: Medicare HMO | Admitting: Physical Therapy

## 2022-04-25 ENCOUNTER — Other Ambulatory Visit: Payer: Self-pay

## 2022-04-25 ENCOUNTER — Ambulatory Visit (HOSPITAL_COMMUNITY): Payer: Medicare HMO | Admitting: Hematology

## 2022-04-25 MED ORDER — PANTOPRAZOLE SODIUM 40 MG PO TBEC: DELAYED_RELEASE_TABLET | ORAL | 3 refills | Status: DC

## 2022-04-28 ENCOUNTER — Encounter: Payer: Medicare HMO | Admitting: Physical Therapy

## 2022-05-09 ENCOUNTER — Encounter: Payer: Medicare HMO | Admitting: Physical Therapy

## 2022-05-09 DIAGNOSIS — H524 Presbyopia: Secondary | ICD-10-CM | POA: Diagnosis not present

## 2022-05-11 ENCOUNTER — Encounter: Payer: Medicare HMO | Admitting: Physical Therapy

## 2022-05-12 ENCOUNTER — Encounter: Payer: Self-pay | Admitting: Physical Medicine & Rehabilitation

## 2022-05-12 ENCOUNTER — Encounter: Payer: Medicare HMO | Attending: Physical Medicine & Rehabilitation | Admitting: Physical Medicine & Rehabilitation

## 2022-05-12 VITALS — BP 124/64 | HR 63 | Ht 63.5 in | Wt 149.0 lb

## 2022-05-12 DIAGNOSIS — D447 Neoplasm of uncertain behavior of aortic body and other paraganglia: Secondary | ICD-10-CM

## 2022-05-12 DIAGNOSIS — M47816 Spondylosis without myelopathy or radiculopathy, lumbar region: Secondary | ICD-10-CM | POA: Diagnosis not present

## 2022-05-12 DIAGNOSIS — G8929 Other chronic pain: Secondary | ICD-10-CM | POA: Diagnosis not present

## 2022-05-12 DIAGNOSIS — M5441 Lumbago with sciatica, right side: Secondary | ICD-10-CM | POA: Insufficient documentation

## 2022-05-12 DIAGNOSIS — Q7649 Other congenital malformations of spine, not associated with scoliosis: Secondary | ICD-10-CM

## 2022-05-12 MED ORDER — DULOXETINE HCL 30 MG PO CPEP: 30.0000 mg | ORAL_CAPSULE | Freq: Every day | ORAL | 2 refills | Status: DC

## 2022-05-12 MED ORDER — METHOCARBAMOL 750 MG PO TABS: 750.0000 mg | ORAL_TABLET | Freq: Four times a day (QID) | ORAL | 1 refills | Status: DC | PRN

## 2022-05-12 NOTE — Progress Notes (Signed)
Subjective:    Patient ID: Heather Cobb, female    DOB: 1941-08-28, 81 y.o.   MRN: [id number]  HPI [name] is a 81 year old female with past medical history of paraganglioma, CKD, prediabetes who is here for back pain that has been worsening for about 3 months.  Patient was previously active and taking walks of about a mile.  She developed pain in her back that shoots down her leg to her lateral ankle on the right.  Pain is worse with walking and standing.  Bending forward or backwards does not change the pain.  She tried gabapentin 100 mg 3 times daily however this did not help her pain and resulted in sedation so she stopped using this.  Extra strength Tylenol provides some benefit, she does not take NSAIDs due to her kidney disease.  No bowel or bladder changes, no new numbness or weakness.  She did try a couple doses of Robaxin and found that this helped her pain.      Pain Inventory Average Pain  4-5 but can get up to a 10 when flared Pain Right Now 4 My pain is intermittent, sharp, burning, and aching  In the last 24 hours, has pain interfered with the following? General activity 2 Relation with others 0 Enjoyment of life 2 What TIME of day is your pain at its worst? evening and night Sleep (in general) Fair some nights it makes it hard to sleep  Pain is worse with: walking, bending, standing, and some activites Pain improves with: rest and medication Relief from Meds: 5  walk without assistance do you drive?  yes  retired  trouble walking spasms  Any changes since last visit?  no  Primary care Waunita Schooner MD    Family History  Problem Relation Age of Onset   Asthma Mother    Kidney failure Mother    Hypertension Daughter    Colon cancer Father        dx in his 82s   Stomach cancer Sister        stage 4   Colon cancer Maternal Uncle    Heart attack Maternal Grandmother    Asthma Brother    Cancer Neg Hx    Social History   Socioeconomic History    Marital status: Widowed    Spouse name: Not on file   Number of children: 1   Years of education: high school   Highest education level: Not on file  Occupational History   Occupation: Engineer, manufacturing systems    Comment: retired  Tobacco Use   Smoking status: Never   Smokeless tobacco: Never  Vaping Use   Vaping Use: Never used  Substance and Sexual Activity   Alcohol use: No    Alcohol/week: 0.0 standard drinks of alcohol   Drug use: No   Sexual activity: Not Currently    Birth control/protection: Surgical  Other Topics Concern   Not on file  Social History Narrative   10/15/19   From: the area   Living: with Daugher [name]) and son-in-law   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, 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      Family: lives with daughter, has 1 grandson and 3 great-grandchildren and 1 great-great-grandchild      Enjoys: eat, dance, travel, cooking      Exercise: walk - not as much in cold, will do 1 mile/day   Diet: good, tries to eat health      Safety  Seat belts: Yes    Guns: Yes  and secure   Safe in relationships: Yes    Social Determinants of Health   Financial Resource Strain: Low Risk  (03/01/2022)   Overall Financial Resource Strain (CARDIA)    Difficulty of Paying Living Expenses: Not hard at all  Food Insecurity: No Food Insecurity (03/01/2022)   Hunger Vital Sign    Worried About Running Out of Food in the Last Year: Never true    Ran Out of Food in the Last Year: Never true  Transportation Needs: No Transportation Needs (03/01/2022)   PRAPARE - Hydrologist (Medical): No    Lack of Transportation (Non-Medical): No  Physical Activity: Insufficiently Active (03/01/2022)   Exercise Vital Sign    Days of Exercise per Week: 2 days    Minutes of Exercise per Session: 30 min  Stress: No Stress Concern Present (03/01/2022)   [place]    Feeling of Stress : Not at all  Social  Connections: Moderately Integrated (03/01/2022)   Social Connection and Isolation Panel [NHANES]    Frequency of Communication with Friends and Family: More than three times a week    Frequency of Social Gatherings with Friends and Family: Three times a week    Attends Religious Services: More than 4 times per year    Active Member of Clubs or Organizations: Yes    Attends Archivist Meetings: More than 4 times per year    Marital Status: Widowed   Past Surgical History:  Procedure Laterality Date   ABDOMINAL HYSTERECTOMY     CATARACT EXTRACTION W/ INTRAOCULAR LENS  IMPLANT, BILATERAL     CHOLECYSTECTOMY N/A 04/15/2015   Procedure: LAPAROSCOPIC CHOLECYSTECTOMY;  Surgeon: Aviva Signs, MD;  Location: [facility];  Service: General;  Laterality: N/A;   COLONOSCOPY  2012   [place]   COLONOSCOPY WITH PROPOFOL N/A 02/03/2016   Procedure: COLONOSCOPY WITH PROPOFOL;  Surgeon: Robert Bellow, MD;  Location: [facility];  Service: Endoscopy;  Laterality: N/A;   EYE SURGERY Bilateral    cataract extractions   HEMORRHOID SURGERY N/A 08/31/2018   Procedure: HEMORRHOIDECTOMY;  Surgeon: Robert Bellow, MD;  Location: [facility];  Service: General;  Laterality: N/A;   IR ANGIO EXTERNAL CAROTID SEL EXT CAROTID UNI L MOD SED  10/26/2017   IR ANGIO INTRA EXTRACRAN SEL COM CAROTID INNOMINATE BILAT MOD SED  10/26/2017   IR ANGIO VERTEBRAL SEL VERTEBRAL UNI R MOD SED  10/26/2017   IR ANGIOGRAM EXTREMITY LEFT  10/26/2017   IR ANGIOGRAM FOLLOW UP STUDY  10/26/2017   IR NEURO EACH ADD'L AFTER BASIC UNI LEFT (MS)  10/26/2017   IR RADIOLOGIST EVAL & MGMT  09/21/2017   IR TRANSCATH/EMBOLIZ  10/26/2017   MASS EXCISION Left 10/27/2017   Procedure: EXCISION CAROTID BODY TUMOR;  Surgeon: Melida Quitter, MD;  Location: [place];  Service: ENT;  Laterality: Left;   RADIOLOGY WITH ANESTHESIA N/A 10/26/2017   Procedure: EMBOLIZATION;  Surgeon: Luanne Bras, MD;  Location: [place];  Service: Radiology;  Laterality: N/A;    SIGMOIDOSCOPY N/A 08/31/2018   Procedure: SIGMOIDOSCOPY-IN  OR;  Surgeon: Robert Bellow, MD;  Location: [facility];  Service: General;  Laterality: N/A;   UPPER GASTROINTESTINAL ENDOSCOPY  2014   VESICOVAGINAL FISTULA CLOSURE W/ TAH     Past Medical History:  Diagnosis Date   Anemia    Carotid body tumor ([place])    left  Family history of colon cancer    Hypertension    Hypothyroid    Pyelonephritis    Rectal bleeding 11/26/2015   Vertigo    Vitamin B 12 deficiency    BP 124/64   Pulse 63   Ht 5' 3.5" (1.613 m)   Wt 149 lb (67.6 kg)   SpO2 95%   BMI 25.98 kg/m   Opioid Risk Score:   Fall Risk Score:  `1  Depression screen [facility] 2/9     05/12/2022   10:51 AM 03/01/2022    1:43 PM 01/14/2021   10:25 AM 10/15/2019    9:34 AM 11/14/2017    8:21 AM  Depression screen PHQ 2/9  Decreased Interest 3 0 0 0 0  Down, Depressed, Hopeless 0 0 0 0 0  PHQ - 2 Score 3 0 0 0 0  Altered sleeping 0      Tired, decreased energy 0      Change in appetite 0      Feeling bad or failure about yourself  0      Trouble concentrating 0      Moving slowly or fidgety/restless 0      Suicidal thoughts 0      PHQ-9 Score 3         Review of Systems  Constitutional: Negative.   HENT: Negative.    Eyes: Negative.   Respiratory: Negative.    Cardiovascular: Negative.   Gastrointestinal: Negative.   Endocrine: Negative.   Genitourinary: Negative.   Musculoskeletal:  Positive for back pain.  Skin: Negative.   Allergic/Immunologic: Negative.   Neurological:        Tingling- radiates to right leg  Hematological: Negative.   Psychiatric/Behavioral: Negative.    All other systems reviewed and are negative.      Objective:   Physical Exam  Gen: no distress, normal appearing HEENT: oral mucosa pink and moist, NCAT Cardio: Reg rate Chest: normal effort, normal rate of breathing Abd: soft, non-distended Ext: no edema Psych: very pleasant, normal affect Skin: intact Neuro: Alert and  oriented , follows commands, answers questions, sensation intact light touch in all 4 extremities, normal and symmetric DTR in bilateral knee, ankle, biceps, triceps, BR Strength 5 out of 5 in all 4 extremities Musculoskeletal:  SLR positive on the right negative on the left No significant lumbar paraspinal tenderness noted Facet loading negative FABER and FADIR negative bilaterally however she did report some tightness in her right hip during these motions  L spine MRI 02/18/22 Vertebrae: Marrow space lesions affecting T12, L1, L4 and L5, most extensive at L4. I think these most likely represent atypical hemangiomas. However, the possibility of osseous metastatic disease is not completely ruled out.   Conus medullaris and cauda equina: Conus extends to the L1 level. Conus and cauda equina appear normal.   Paraspinal and other soft tissues: Negative   Disc levels:   T11-12 through L1-2: Normal   L2-3: Minimal disc bulge.  Minimal facet hypertrophy.  No stenosis.   L3-4: Mild bulging of the disc. Mild facet hypertrophy. No stenosis.   L4-5: Mild bulging of the disc. Mild facet and ligamentous hypertrophy. No compressive stenosis.   L5-S1: Shallow protrusion of the disc. Mild indentation of the thecal sac. Mild narrowing of the lateral recesses and foramina. Foraminal encroachment is more pronounced on the right, where the right L5 nerve could be affected.   IMPRESSION: L5-S1: Shallow disc protrusion. Mild indentation of the thecal sac with mild stenosis  of the subarticular lateral recesses and neural foramina. Foraminal encroachment is more pronounced on the right, where the right L5 nerve could be irritated.   Mild, non-compressive degenerative changes from L2-3 through L4-5 as described above.   Marrow space signal abnormalities at T12, L1, L4 and L5. I think these probably represent atypical hemangiomas. The possibility of metastatic disease does exist but is not  favored. Does the patient have any known malignancy?      Assessment & Plan:   Chronic lower back pain with right radiculopathy likely L5.  Lumbar spondylosis with MRI with L5-S1 disc protrusion with mild indentation of thecal sac and foraminal narrowing. -Was stopped gabapentin due to sedation -Start duloxetine 30 mg daily -We will start Robaxin 750 mg as needed  History of paraganglioma followed by oncology.  Recent MRI indicated abnormalities representing possible hemangiomas however metastatic disease was not excluded. -Oncology referral given history of cancer for further evaluation

## 2022-05-16 ENCOUNTER — Inpatient Hospital Stay: Payer: Medicare HMO | Attending: Hematology | Admitting: Hematology

## 2022-05-16 ENCOUNTER — Encounter: Payer: Medicare HMO | Admitting: Physical Therapy

## 2022-05-16 VITALS — BP 120/69 | HR 60 | Temp 98.5°F | Resp 18 | Ht 64.5 in | Wt 150.0 lb

## 2022-05-16 DIAGNOSIS — M545 Low back pain, unspecified: Secondary | ICD-10-CM

## 2022-05-16 DIAGNOSIS — Z7982 Long term (current) use of aspirin: Secondary | ICD-10-CM | POA: Insufficient documentation

## 2022-05-16 DIAGNOSIS — Z79899 Other long term (current) drug therapy: Secondary | ICD-10-CM | POA: Diagnosis not present

## 2022-05-16 DIAGNOSIS — D447 Neoplasm of uncertain behavior of aortic body and other paraganglia: Secondary | ICD-10-CM | POA: Insufficient documentation

## 2022-05-16 NOTE — Patient Instructions (Addendum)
[place] at [facility] Discharge Instructions   You were seen and examined today by Dr. Delton Coombes.  He reviewed the results of your 02/18/2022 MRI.   We will arrange for you to have an MRI with and without contrast to investigate further.  Dr. Raliegh Ip will call you with those results.    Thank you for choosing [place] at [facility] to provide your oncology and hematology care.  To afford each patient quality time with our provider, please arrive at least 15 minutes before your scheduled appointment time.   If you have a lab appointment with the [place] please come in thru the Main Entrance and check in at the main information desk.  You need to re-schedule your appointment should you arrive 10 or more minutes late.  We strive to give you quality time with our providers, and arriving late affects you and other patients whose appointments are after yours.  Also, if you no show three or more times for appointments you may be dismissed from the clinic at the providers discretion.     Again, thank you for choosing [facility].  Our hope is that these requests will decrease the amount of time that you wait before being seen by our physicians.       _____________________________________________________________  Should you have questions after your visit to [facility], please contact our office at [phone] and follow the prompts.  Our office hours are 8:00 a.m. and 4:30 p.m. Monday - Friday.  Please note that voicemails left after 4:00 p.m. may not be returned until the following business day.  We are closed weekends and major holidays.  You do have access to a nurse 24-7, just call the main number to the clinic [phone] and do not press any options, hold on the line and a nurse will answer the phone.    For prescription refill requests, have your pharmacy contact our office and allow 72 hours.    Due to Covid,  you will need to wear a mask upon entering the hospital. If you do not have a mask, a mask will be given to you at the Main Entrance upon arrival. For doctor visits, patients may have 1 support person age 53 or older with them. For treatment visits, patients can not have anyone with them due to social distancing guidelines and our immunocompromised population.

## 2022-05-16 NOTE — Progress Notes (Signed)
Heather Cobb, Heather Cobb   CLINIC:  Medical Oncology/Hematology  PCP:  Lesleigh Noe, [place] / [place] [address] [phone]   REASON FOR VISIT:  Follow-up for paraganglioma  PRIOR THERAPY: Excision of left carotid body tumor on 10/27/2017  CURRENT THERAPY: Surveillance  BRIEF ONCOLOGIC HISTORY:  Oncology History   No history exists.    CANCER STAGING:  Cancer Staging  No matching staging information was found for the patient.  INTERVAL HISTORY:  Heather Cobb, a 81 y.o. female, who was sent back to me by his pain specialist Dr. For abnormal lumbar spine MRI done in June.  She denies any lower back pain but has right lower extremity pain.  REVIEW OF SYSTEMS:  Review of Systems  Constitutional:  Negative for appetite change, fatigue, fever and unexpected weight change.  Gastrointestinal:  Positive for constipation and nausea.  Endocrine: Negative for hot flashes.  Neurological:  Positive for numbness (R sciatica).  All other systems reviewed and are negative.   PAST MEDICAL/SURGICAL HISTORY:  Past Medical History:  Diagnosis Date   Anemia    Carotid body tumor ([place])    left   Family history of colon cancer    Hypertension    Hypothyroid    Pyelonephritis    Rectal bleeding 11/26/2015   Vertigo    Vitamin B 12 deficiency    Past Surgical History:  Procedure Laterality Date   ABDOMINAL HYSTERECTOMY     CATARACT EXTRACTION W/ INTRAOCULAR LENS  IMPLANT, BILATERAL     CHOLECYSTECTOMY N/A 04/15/2015   Procedure: LAPAROSCOPIC CHOLECYSTECTOMY;  Surgeon: Aviva Signs, MD;  Location: [facility];  Service: General;  Laterality: N/A;   COLONOSCOPY  2012   [place]   COLONOSCOPY WITH PROPOFOL N/A 02/03/2016   Procedure: COLONOSCOPY WITH PROPOFOL;  Surgeon: Robert Bellow, MD;  Location: [facility] END[facility];  Service: Endoscopy;  Laterality: N/A;   EYE SURGERY Bilateral    cataract extractions   HEMORRHOID  SURGERY N/A 08/31/2018   Procedure: HEMORRHOIDECTOMY;  Surgeon: Robert Bellow, MD;  Location: [facility] ORS;  Service: General;  Laterality: N/A;   IR ANGIO EXTERNAL CAROTID SEL EXT CAROTID UNI L MOD SED  10/26/2017   IR ANGIO INTRA EXTRACRAN SEL COM CAROTID INNOMINATE BILAT MOD SED  10/26/2017   IR ANGIO VERTEBRAL SEL VERTEBRAL UNI R MOD SED  10/26/2017   IR ANGIOGRAM EXTREMITY LEFT  10/26/2017   IR ANGIOGRAM FOLLOW UP STUDY  10/26/2017   IR NEURO EACH ADD'L AFTER BASIC UNI LEFT (MS)  10/26/2017   IR RADIOLOGIST EVAL & MGMT  09/21/2017   IR TRANSCATH/EMBOLIZ  10/26/2017   MASS EXCISION Left 10/27/2017   Procedure: EXCISION CAROTID BODY TUMOR;  Surgeon: Melida Quitter, MD;  Location: [place];  Service: ENT;  Laterality: Left;   RADIOLOGY WITH ANESTHESIA N/A 10/26/2017   Procedure: EMBOLIZATION;  Surgeon: Luanne Bras, MD;  Location: [place];  Service: Radiology;  Laterality: N/A;   SIGMOIDOSCOPY N/A 08/31/2018   Procedure: SIGMOIDOSCOPY-IN  OR;  Surgeon: Robert Bellow, MD;  Location: [facility] ORS;  Service: General;  Laterality: N/A;   UPPER GASTROINTESTINAL ENDOSCOPY  2014   VESICOVAGINAL FISTULA CLOSURE W/ TAH      SOCIAL HISTORY:  Social History   Socioeconomic History   Marital status: Widowed    Spouse name: Not on file   Number of children: 1   Years of education: high school   Highest education level:  Not on file  Occupational History   Occupation: Engineer, manufacturing systems    Comment: retired  Tobacco Use   Smoking status: Never   Smokeless tobacco: Never  Vaping Use   Vaping Use: Never used  Substance and Sexual Activity   Alcohol use: No    Alcohol/week: 0.0 standard drinks of alcohol   Drug use: No   Sexual activity: Not Currently    Birth control/protection: Surgical  Other Topics Concern   Not on file  Social History Narrative   10/15/19   From: the area   Living: with Daugher [name]) and son-in-law   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, 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      Family: lives  with daughter, has 1 grandson and 3 great-grandchildren and 1 great-great-grandchild      Enjoys: eat, dance, travel, cooking      Exercise: walk - not as much in cold, will do 1 mile/day   Diet: good, tries to eat health      Safety   Seat belts: Yes    Guns: Yes  and secure   Safe in relationships: Yes    Social Determinants of Health   Financial Resource Strain: Low Risk  (03/01/2022)   Overall Financial Resource Strain (CARDIA)    Difficulty of Paying Living Expenses: Not hard at all  Food Insecurity: No Food Insecurity (03/01/2022)   Hunger Vital Sign    Worried About Running Out of Food in the Last Year: Never true    [place] in the Last Year: Never true  Transportation Needs: No Transportation Needs (03/01/2022)   PRAPARE - Hydrologist (Medical): No    Lack of Transportation (Non-Medical): No  Physical Activity: Insufficiently Active (03/01/2022)   Exercise Vital Sign    Days of Exercise per Week: 2 days    Minutes of Exercise per Session: 30 min  Stress: No Stress Concern Present (03/01/2022)   [place]    Feeling of Stress : Not at all  Social Connections: Moderately Integrated (03/01/2022)   Social Connection and Isolation Panel [NHANES]    Frequency of Communication with Friends and Family: More than three times a week    Frequency of Social Gatherings with Friends and Family: Three times a week    Attends Religious Services: More than 4 times per year    Active Member of Clubs or Organizations: Yes    Attends Archivist Meetings: More than 4 times per year    Marital Status: Widowed  Intimate Partner Violence: Not At Risk (03/01/2022)   Humiliation, Afraid, Rape, and Kick questionnaire    Fear of Current or Ex-Partner: No    Emotionally Abused: No    Physically Abused: No    Sexually Abused: No    FAMILY HISTORY:  Family History  Problem Relation  Age of Onset   Asthma Mother    Kidney failure Mother    Hypertension Daughter    Colon cancer Father        dx in his 31s   Stomach cancer Sister        stage 4   Colon cancer Maternal Uncle    Heart attack Maternal Grandmother    Asthma Brother    Cancer Neg Hx     CURRENT MEDICATIONS:  Current Outpatient Medications  Medication Sig Dispense Refill   acetaminophen (TYLENOL) 325 MG tablet Take 650 mg by mouth  every 6 (six) hours as needed.     albuterol (VENTOLIN HFA) 108 (90 Base) MCG/ACT inhaler Inhale 2 puffs into the lungs every 6 (six) hours as needed for wheezing or shortness of breath. 18 g 11   amLODipine (NORVASC) 5 MG tablet Take 5 mg by mouth 2 (two) times daily.     aspirin EC 81 MG tablet Take 81 mg by mouth daily.     benzonatate (TESSALON PERLES) 100 MG capsule Take 1 capsule (100 mg total) by mouth 2 (two) times daily. 180 capsule 1   cholecalciferol (VITAMIN D) 1000 UNITS tablet Take 1,000 Units by mouth daily.     clotrimazole-betamethasone (LOTRISONE) cream APPLY TOPICALLY TO THE AFFECTED AREA TWICE DAILY 30 g 3   DULoxetine (CYMBALTA) 30 MG capsule Take 1 capsule (30 mg total) by mouth daily. 30 capsule 2   ferrous sulfate 325 (65 FE) MG tablet Take 325 mg by mouth daily with breakfast.     levalbuterol (XOPENEX HFA) 45 MCG/ACT inhaler Inhale 1-2 puffs into the lungs every 4 (four) hours as needed for wheezing. 1 Inhaler 0   levothyroxine (SYNTHROID) 75 MCG tablet Take 1 tablet (75 mcg total) by mouth daily before breakfast. 90 tablet 1   linaclotide (LINZESS) 145 MCG CAPS capsule Take 1 capsule (145 mcg total) by mouth daily as needed (for constipation). 90 capsule 3   methocarbamol (ROBAXIN-750) 750 MG tablet Take 1 tablet (750 mg total) by mouth every 6 (six) hours as needed for muscle spasms. 120 tablet 1   olmesartan (BENICAR) 40 MG tablet TAKE 1 TABLET(40 MG) BY MOUTH DAILY 90 tablet 3   pantoprazole (PROTONIX) 40 MG tablet TAKE 1 TABLET(40 MG) BY MOUTH DAILY  90 tablet 3   spironolactone (ALDACTONE) 25 MG tablet Take 1 tablet (25 mg total) by mouth daily. For blood pressure. 90 tablet 1   vitamin B-12 (CYANOCOBALAMIN) 1000 MCG tablet Take 1,000 mcg by mouth daily.      No current facility-administered medications for this visit.    ALLERGIES:  Allergies  Allergen Reactions   Codeine Nausea Only and Other (See Comments)    fever fever fever    PHYSICAL EXAM:  Performance status (ECOG): 1 - Symptomatic but completely ambulatory  Vitals:   05/16/22 0913  BP: 120/69  Pulse: 60  Resp: 18  Temp: 98.5 F (36.9 C)  SpO2: 96%   Wt Readings from Last 3 Encounters:  05/16/22 150 lb (68 kg)  05/12/22 149 lb (67.6 kg)  04/20/22 150 lb (68 kg)   Physical Exam Vitals reviewed.  Constitutional:      Appearance: Normal appearance.  Cardiovascular:     Rate and Rhythm: Normal rate and regular rhythm.     Pulses: Normal pulses.     Heart sounds: Normal heart sounds.  Pulmonary:     Effort: Pulmonary effort is normal.     Breath sounds: Normal breath sounds.  Abdominal:     Palpations: Abdomen is soft. There is no hepatomegaly, splenomegaly or mass.     Tenderness: There is no abdominal tenderness.  Lymphadenopathy:     Cervical: No cervical adenopathy.     Right cervical: No superficial, deep or posterior cervical adenopathy.    Left cervical: No superficial, deep or posterior cervical adenopathy.     Upper Body:     Right upper body: No supraclavicular or axillary adenopathy.     Left upper body: No supraclavicular or axillary adenopathy.     Lower Body: No right  inguinal adenopathy. No left inguinal adenopathy.  Neurological:     General: No focal deficit present.     Mental Status: She is alert and oriented to person, place, and time.  Psychiatric:        Mood and Affect: Mood normal.        Behavior: Behavior normal.      LABORATORY DATA:  I have reviewed the labs as listed.     Latest Ref Rng & Units 04/06/2022    8:56  AM 03/31/2021    8:55 AM 01/14/2021   10:04 AM  CBC  WBC 4.0 - 10.5 K/uL 4.8  5.2  4.7   Hemoglobin 12.0 - 15.0 g/dL 12.1  12.9  12.9   Hematocrit 36.0 - 46.0 % 37.1  40.8  38.7   Platelets 150 - 400 K/uL 219  222  219.0       Latest Ref Rng & Units 02/23/2022    1:52 PM 03/31/2021    8:55 AM 03/02/2021   11:27 AM  CMP  Glucose 70 - 99 mg/dL 117  113  94   BUN 6 - 23 mg/dL _0 Creatinine 0.40 - 1.20 mg/dL 1.21  1.22  1.30   Sodium 135 - 145 mEq/L 141  142  142   Potassium 3.5 - 5.1 mEq/L 3.8  3.7  3.7   Chloride 96 - 112 mEq/L 104  102  103   CO2 19 - 32 mEq/L 29  30  32   Calcium 8.4 - 10.5 mg/dL 10.4  10.1  10.0   Total Protein 6.5 - 8.1 g/dL  7.6    Total Bilirubin 0.3 - 1.2 mg/dL  0.9    Alkaline Phos 38 - 126 U/L  66    AST 15 - 41 U/L  16    ALT 0 - 44 U/L  13      DIAGNOSTIC IMAGING:  I have independently reviewed the scans and discussed with the patient. No results found.   ASSESSMENT:  1.  Paraganglioma of the left carotid artery: -Left carotid body biopsy on 10/27/2017 shows paraganglioma.  Status was not mentioned. -Work-up with 24-hour urine metanephrines and normetanephrine's were within normal limits. -Genetic testing for SDH and BHL was negative.  It showed VUS in KIT and STK11. -CT soft tissue neck on 03/27/2020 did not show any evidence of recurrence or residual carotid body tumor.   PLAN:  1.  Paraganglioma of the left carotid artery: -Physical examination was benign.  She does not have any new complaints. - Reviewed labs from 04/06/2022 which showed normal CBC.  TSH was normal. - CT soft tissue neck (04/06/2022): Stable without evidence of recurrence. - She was previously recommended follow-up in 1 year with repeat CT scan for 1 more last time.   2.  CKD: - Latest creatinine is 1.21 and stable.  3.  Spine lesions: - MRI of the lumbar spine without contrast on 02/18/2022: Marrow space signal abnormalities at T12, L1, L4 and L5?  Probably representing  atypical hemangiomas.  Possibility of metastatic disease cannot be ruled out. - Recommend MRI of the lumbar spine with and without contrast.  Recommend phone visit after that.   Orders placed this encounter:  Orders Placed This Encounter  Procedures   MR Lumbar Spine W [place], [place] [phone]

## 2022-05-18 ENCOUNTER — Encounter: Payer: Medicare HMO | Admitting: Physical Therapy

## 2022-05-19 DIAGNOSIS — E039 Hypothyroidism, unspecified: Secondary | ICD-10-CM | POA: Diagnosis not present

## 2022-05-19 DIAGNOSIS — E538 Deficiency of other specified B group vitamins: Secondary | ICD-10-CM | POA: Diagnosis not present

## 2022-05-19 DIAGNOSIS — D649 Anemia, unspecified: Secondary | ICD-10-CM | POA: Diagnosis not present

## 2022-05-19 DIAGNOSIS — N1832 Chronic kidney disease, stage 3b: Secondary | ICD-10-CM | POA: Diagnosis not present

## 2022-05-19 DIAGNOSIS — I10 Essential (primary) hypertension: Secondary | ICD-10-CM | POA: Diagnosis not present

## 2022-05-24 ENCOUNTER — Telehealth (INDEPENDENT_AMBULATORY_CARE_PROVIDER_SITE_OTHER): Payer: Medicare HMO | Admitting: Family Medicine

## 2022-05-24 ENCOUNTER — Telehealth: Payer: Self-pay | Admitting: Family Medicine

## 2022-05-24 ENCOUNTER — Encounter: Payer: Medicare HMO | Admitting: Physical Therapy

## 2022-05-24 ENCOUNTER — Ambulatory Visit (HOSPITAL_COMMUNITY)
Admission: RE | Admit: 2022-05-24 | Discharge: 2022-05-24 | Disposition: A | Discharge status: Home / Self Care | Payer: Medicare HMO | Source: Ambulatory Visit | Attending: Hematology | Admitting: Hematology

## 2022-05-24 DIAGNOSIS — M545 Low back pain, unspecified: Secondary | ICD-10-CM

## 2022-05-24 DIAGNOSIS — M47816 Spondylosis without myelopathy or radiculopathy, lumbar region: Secondary | ICD-10-CM | POA: Diagnosis not present

## 2022-05-24 DIAGNOSIS — R059 Cough, unspecified: Secondary | ICD-10-CM | POA: Diagnosis not present

## 2022-05-24 MED ORDER — GADOBUTROL 1 MMOL/ML IV SOLN
7.0000 mL | Freq: Once | INTRAVENOUS | Status: AC | PRN
Start: 2022-05-24 — End: 2022-05-24
  Administered 2022-05-24: 7 mL via INTRAVENOUS

## 2022-05-24 MED ORDER — BENZONATATE 100 MG PO CAPS: ORAL_CAPSULE | ORAL | 0 refills | Status: DC

## 2022-05-24 NOTE — Telephone Encounter (Signed)
Called patient she wanted refill for cough that is only at night. Has been ongoing for over a month. I have set up for virtual with Dr. Maudie Mercury this afternoon.

## 2022-05-24 NOTE — Progress Notes (Signed)
Virtual Visit via Telephone Note  I connected with Heather Cobb on 05/24/22 at  1:00 PM EDT by telephone and verified that I am speaking with the correct person using two identifiers.   I discussed the limitations of performing an evaluation and management service by telephone and requested permission for a phone visit. The patient expressed understanding and agreed to proceed.  Location patient:  [place] Location provider: work or home office Participants present for the call: patient, provider Patient did not have a visit with me in the prior 7 days to address this/these issue(s).   History of Present Illness:  Acute telemedicine visit for cough: -Onset: about 4 weeks ago with covid,  improving, but still at times has had a cough at night a little - not bad, but going to visit grandkids and doesn't want to cough at night -has tried alb - helps a little -hycodan helps but ran out -Denies:CP, SOB, wheezing, sinus issues, fevers, malaise, thick mucus, asthma symptoms -Pertinent past medical history: see below -Pertinent medication allergies:  Allergies  Allergen Reactions   Codeine Nausea Only and Other (See Comments)    fever fever fever  -COVID-19 vaccine status: Immunization History  Administered Date(s) Administered   Fluad Quad(high Dose 65+) 06/04/2020   Influenza, High Dose Seasonal PF 06/19/2019   Influenza-Unspecified 07/02/2021   PFIZER(Purple Top)SARS-COV-2 Vaccination 10/29/2019, 11/19/2019, 07/06/2020   Pneumococcal Polysaccharide-23 06/22/2020      Past Medical History:  Diagnosis Date   Anemia    Carotid body tumor ([place])    left   Family history of colon cancer    Hypertension    Hypothyroid    Pyelonephritis    Rectal bleeding 11/26/2015   Vertigo    Vitamin B 12 deficiency     Current Outpatient Medications on File Prior to Visit  Medication Sig Dispense Refill   acetaminophen (TYLENOL) 325 MG tablet Take 650 mg by mouth every 6 (six) hours as needed.      albuterol (VENTOLIN HFA) 108 (90 Base) MCG/ACT inhaler Inhale 2 puffs into the lungs every 6 (six) hours as needed for wheezing or shortness of breath. 18 g 11   amLODipine (NORVASC) 5 MG tablet Take 5 mg by mouth 2 (two) times daily.     aspirin EC 81 MG tablet Take 81 mg by mouth daily.     cholecalciferol (VITAMIN D) 1000 UNITS tablet Take 1,000 Units by mouth daily.     clotrimazole-betamethasone (LOTRISONE) cream APPLY TOPICALLY TO THE AFFECTED AREA TWICE DAILY 30 g 3   DULoxetine (CYMBALTA) 30 MG capsule Take 1 capsule (30 mg total) by mouth daily. 30 capsule 2   ferrous sulfate 325 (65 FE) MG tablet Take 325 mg by mouth daily with breakfast.     levalbuterol (XOPENEX HFA) 45 MCG/ACT inhaler Inhale 1-2 puffs into the lungs every 4 (four) hours as needed for wheezing. 1 Inhaler 0   levothyroxine (SYNTHROID) 75 MCG tablet Take 1 tablet (75 mcg total) by mouth daily before breakfast. 90 tablet 1   linaclotide (LINZESS) 145 MCG CAPS capsule Take 1 capsule (145 mcg total) by mouth daily as needed (for constipation). 90 capsule 3   methocarbamol (ROBAXIN-750) 750 MG tablet Take 1 tablet (750 mg total) by mouth every 6 (six) hours as needed for muscle spasms. 120 tablet 1   olmesartan (BENICAR) 40 MG tablet TAKE 1 TABLET(40 MG) BY MOUTH DAILY 90 tablet 3   pantoprazole (PROTONIX) 40 MG tablet TAKE 1 TABLET(40 MG) BY MOUTH  DAILY 90 tablet 3   spironolactone (ALDACTONE) 25 MG tablet Take 1 tablet (25 mg total) by mouth daily. For blood pressure. 90 tablet 1   vitamin B-12 (CYANOCOBALAMIN) 1000 MCG tablet Take 1,000 mcg by mouth daily.      No current facility-administered medications on file prior to visit.    Observations/Objective: Patient sounds cheerful and well on the phone. I do not appreciate any SOB. Speech and thought processing are grossly intact. Patient reported vitals:  Assessment and Plan:  Cough, unspecified type  -we discussed possible serious and likely etiologies,  options for evaluation and workup, limitations of telemedicine visit vs in person visit, treatment, treatment risks and precautions. Pt prefers to treat via telemedicine empirically rather than in person at this moment. Reports has chronic cough, but feels this is just getting over covid as is improving. She wants cough medication. Opted to try tessalon rx. Advised to seek prompt  in person care if worsening, new symptoms arise, or if is not improving with treatment as expected per our conversation of expected course. Discussed options for follow up care. Did let this patient know that I do telemedicine on Tuesdays and Thursdays for [place] and those are the days I am logged into the system. Advised to schedule follow up visit with PCP, [place] virtual visits or [facility] if any further questions or concerns to avoid delays in care.   I discussed the assessment and treatment plan with the patient. The patient was provided an opportunity to ask questions and all were answered. The patient agreed with the plan and demonstrated an understanding of the instructions.    Follow Up Instructions:  I did not refer this patient for an OV with me in the next 24 hours for this/these issue(s).  I discussed the assessment and treatment plan with the patient. The patient was provided an opportunity to ask questions and all were answered. The patient agreed with the plan and demonstrated an understanding of the instructions.   I spent 12 minutes on the date of this visit in the care of this patient. See summary of tasks completed to properly care for this patient in the detailed notes above which also included counseling of above, review of PMH, medications, allergies, evaluation of the patient and ordering and/or  instructing patient on testing and care options.     Heather Kern, DO

## 2022-05-24 NOTE — Patient Instructions (Signed)
-  I sent the medication(s) we discussed to your pharmacy: Meds ordered this encounter  Medications   benzonatate (TESSALON PERLES) 100 MG capsule    Sig: 1-2 capsules up to twice daily for cough    Dispense:  20 capsule    Refill:  0     I hope you are feeling better soon!  Seek in person care promptly if your symptoms worsen, new concerns arise or you are not improving with treatment.  It was nice to meet you today. I help [facility] out with telemedicine visits on Tuesdays and Thursdays and am happy to help if you need a virtual follow up visit on those days. Otherwise, if you have any concerns or questions following this visit please schedule a follow up visit with your Primary Care office or seek care at a local urgent care clinic to avoid delays in care. If you are having severe or life threatening symptoms please call 911 and/or go to the nearest emergency room.

## 2022-05-24 NOTE — Telephone Encounter (Signed)
Patient called and stated can she be prescribed HYDROcodone bit-homatropine (HYCODAN) 5-1.5 MG/5ML syrup again. Call back number [phone].

## 2022-05-26 ENCOUNTER — Encounter: Payer: Self-pay | Admitting: Hematology

## 2022-05-26 ENCOUNTER — Encounter: Payer: Medicare HMO | Admitting: Physical Therapy

## 2022-05-26 ENCOUNTER — Inpatient Hospital Stay: Payer: Medicare HMO | Admitting: Hematology

## 2022-05-26 ENCOUNTER — Inpatient Hospital Stay: Payer: Medicare HMO | Attending: Hematology | Admitting: Hematology

## 2022-05-26 VITALS — Wt 150.0 lb

## 2022-05-26 DIAGNOSIS — D447 Neoplasm of uncertain behavior of aortic body and other paraganglia: Secondary | ICD-10-CM

## 2022-05-26 NOTE — Progress Notes (Signed)
Virtual Visit via Telephone Note  I connected with Heather Cobb on 05/26/22 at  2:30 PM EDT by telephone and verified that I am speaking with the correct person using two identifiers.  Location: Patient: At home Provider: In the office   I discussed the limitations, risks, security and privacy concerns of performing an evaluation and management service by telephone and the availability of in person appointments. I also discussed with the patient that there may be a patient responsible charge related to this service. The patient expressed understanding and agreed to proceed.   History of Present Illness: She is seen in our office for follow-up of paraganglioma of the left carotid artery.   Observations/Objective: She was found to have bone marrow signal abnormalities at T12, L1, L4 and L5 on MRI of the lumbar spine done by her back specialist.  She does not report any fevers, night sweats or weight loss.  No new onset pains.  Assessment and Plan:  1.  Abnormal spine lesions: - We have reviewed MRI of the lumbar spine with and without contrast dated 05/25/2022.  Foci of abnormal marrow signal again identified primarily at T12, L1, L4 and L5.  Enhancement is also noted.  No progression of these lesions when compared to the initial MRI from June of this year. - I have told her that these lesions are most consistent with hemangiomas.  No further work-up is indicated. - She will follow-up with me in a year with CT of the neck for paraganglioma.   Follow Up Instructions: She already has a follow-up appointment with me in a year.   I discussed the assessment and treatment plan with the patient. The patient was provided an opportunity to ask questions and all were answered. The patient agreed with the plan and demonstrated an understanding of the instructions.   The patient was advised to call back or seek an in-person evaluation if the symptoms worsen or if the condition fails to improve as  anticipated.  I provided 21 minutes of non-face-to-face time during this encounter.   Derek Jack, MD

## 2022-05-26 NOTE — Telephone Encounter (Signed)
Noted  

## 2022-05-30 ENCOUNTER — Encounter: Payer: Medicare HMO | Admitting: Physical Therapy

## 2022-06-01 ENCOUNTER — Encounter: Payer: Medicare HMO | Admitting: Physical Therapy

## 2022-06-06 ENCOUNTER — Encounter: Payer: Medicare HMO | Admitting: Physical Therapy

## 2022-06-08 ENCOUNTER — Encounter: Payer: Medicare HMO | Admitting: Physical Therapy

## 2022-06-15 ENCOUNTER — Other Ambulatory Visit: Payer: Medicare HMO

## 2022-06-21 ENCOUNTER — Ambulatory Visit (INDEPENDENT_AMBULATORY_CARE_PROVIDER_SITE_OTHER): Payer: Medicare HMO | Admitting: Family

## 2022-06-21 ENCOUNTER — Ambulatory Visit (INDEPENDENT_AMBULATORY_CARE_PROVIDER_SITE_OTHER)
Admission: RE | Admit: 2022-06-21 | Discharge: 2022-06-21 | Disposition: A | Discharge status: Home / Self Care | Payer: Medicare HMO | Source: Ambulatory Visit | Attending: Family | Admitting: Family

## 2022-06-21 ENCOUNTER — Encounter: Payer: Self-pay | Admitting: Family

## 2022-06-21 VITALS — BP 128/58 | HR 54 | Temp 98.6°F | Resp 16 | Ht 64.5 in | Wt 150.1 lb

## 2022-06-21 DIAGNOSIS — R0602 Shortness of breath: Secondary | ICD-10-CM | POA: Insufficient documentation

## 2022-06-21 DIAGNOSIS — K219 Gastro-esophageal reflux disease without esophagitis: Secondary | ICD-10-CM | POA: Diagnosis not present

## 2022-06-21 DIAGNOSIS — R6889 Other general symptoms and signs: Secondary | ICD-10-CM | POA: Diagnosis not present

## 2022-06-21 DIAGNOSIS — R600 Localized edema: Secondary | ICD-10-CM

## 2022-06-21 DIAGNOSIS — R9389 Abnormal findings on diagnostic imaging of other specified body structures: Secondary | ICD-10-CM | POA: Insufficient documentation

## 2022-06-21 DIAGNOSIS — G8929 Other chronic pain: Secondary | ICD-10-CM

## 2022-06-21 DIAGNOSIS — Z8603 Personal history of neoplasm of uncertain behavior: Secondary | ICD-10-CM | POA: Diagnosis not present

## 2022-06-21 DIAGNOSIS — E039 Hypothyroidism, unspecified: Secondary | ICD-10-CM | POA: Diagnosis not present

## 2022-06-21 DIAGNOSIS — D447 Neoplasm of uncertain behavior of aortic body and other paraganglia: Secondary | ICD-10-CM

## 2022-06-21 DIAGNOSIS — R232 Flushing: Secondary | ICD-10-CM

## 2022-06-21 DIAGNOSIS — J986 Disorders of diaphragm: Secondary | ICD-10-CM

## 2022-06-21 DIAGNOSIS — R0609 Other forms of dyspnea: Secondary | ICD-10-CM

## 2022-06-21 DIAGNOSIS — I10 Essential (primary) hypertension: Secondary | ICD-10-CM | POA: Diagnosis not present

## 2022-06-21 DIAGNOSIS — M5416 Radiculopathy, lumbar region: Secondary | ICD-10-CM

## 2022-06-21 DIAGNOSIS — R053 Chronic cough: Secondary | ICD-10-CM | POA: Diagnosis not present

## 2022-06-21 DIAGNOSIS — D446 Neoplasm of uncertain behavior of carotid body: Secondary | ICD-10-CM

## 2022-06-21 LAB — TSH: TSH: 1.62 u[IU]/mL (ref 0.35–5.50)

## 2022-06-21 LAB — BRAIN NATRIURETIC PEPTIDE: Pro B Natriuretic peptide (BNP): 48 pg/mL (ref 0.0–100.0)

## 2022-06-21 MED ORDER — OLMESARTAN MEDOXOMIL 40 MG PO TABS: ORAL_TABLET | ORAL | 3 refills | Status: DC

## 2022-06-21 MED ORDER — ALBUTEROL SULFATE HFA 108 (90 BASE) MCG/ACT IN AERS: 2.0000 | INHALATION_SPRAY | Freq: Four times a day (QID) | RESPIRATORY_TRACT | 11 refills | Status: DC | PRN

## 2022-06-21 MED ORDER — DEXLANSOPRAZOLE 30 MG PO CPDR: 30.0000 mg | DELAYED_RELEASE_CAPSULE | Freq: Every day | ORAL | 2 refills | Status: DC

## 2022-06-21 MED ORDER — SPIRONOLACTONE 25 MG PO TABS: 25.0000 mg | ORAL_TABLET | Freq: Every day | ORAL | 1 refills | Status: DC

## 2022-06-21 NOTE — Progress Notes (Unsigned)
Established Patient Office Visit  Subjective:  Patient ID: Heather Cobb, female    DOB: Jul 17, 1941  Age: 81 y.o. MRN: [id number]  CC:  Chief Complaint  Patient presents with   Transitions Of Care    HPI Heather Cobb is here for a transition of care visit.  Prior provider was: Dr. Waunita Schooner  Pt is without acute concerns.   chronic concerns:  Acid reflux: dependent on what she eats. Pantoprazole 40 mg once daily however not always having control. She has been taking this medication x two years. She denies prior use of omeprazole or lansoprazole.   Chronic cough: chest xray 02/23/22 reviewed with stable linear density lateral left lung base, mild elevation of right hemidiaphragm.   Hemangiomas of spine, seen by oncology Dr. Delton Coombes, has f/u one year as following h/o paraganglioma of the neck. Reviewed MRI lumbar spine 02/18/22, atypical hemangiomas.   Past Medical History:  Diagnosis Date   Anemia    Carotid body tumor ([place])    left   Family history of colon cancer    Hypertension    Hypothyroid    Pyelonephritis    Rectal bleeding 11/26/2015   Vertigo    Vitamin B 12 deficiency     Past Surgical History:  Procedure Laterality Date   ABDOMINAL HYSTERECTOMY     CATARACT EXTRACTION W/ INTRAOCULAR LENS  IMPLANT, BILATERAL     CHOLECYSTECTOMY N/A 04/15/2015   Procedure: LAPAROSCOPIC CHOLECYSTECTOMY;  Surgeon: Aviva Signs, MD;  Location: [facility];  Service: General;  Laterality: N/A;   COLONOSCOPY  2012   [place]   COLONOSCOPY WITH PROPOFOL N/A 02/03/2016   Procedure: COLONOSCOPY WITH PROPOFOL;  Surgeon: Robert Bellow, MD;  Location: [facility];  Service: Endoscopy;  Laterality: N/A;   EYE SURGERY Bilateral    cataract extractions   HEMORRHOID SURGERY N/A 08/31/2018   Procedure: HEMORRHOIDECTOMY;  Surgeon: Robert Bellow, MD;  Location: [facility];  Service: General;  Laterality: N/A;   IR ANGIO EXTERNAL CAROTID SEL EXT CAROTID UNI L MOD SED  10/26/2017   IR  ANGIO INTRA EXTRACRAN SEL COM CAROTID INNOMINATE BILAT MOD SED  10/26/2017   IR ANGIO VERTEBRAL SEL VERTEBRAL UNI R MOD SED  10/26/2017   IR ANGIOGRAM EXTREMITY LEFT  10/26/2017   IR ANGIOGRAM FOLLOW UP STUDY  10/26/2017   IR NEURO EACH ADD'L AFTER BASIC UNI LEFT (MS)  10/26/2017   IR RADIOLOGIST EVAL & MGMT  09/21/2017   IR TRANSCATH/EMBOLIZ  10/26/2017   MASS EXCISION Left 10/27/2017   Procedure: EXCISION CAROTID BODY TUMOR;  Surgeon: Melida Quitter, MD;  Location: [place];  Service: ENT;  Laterality: Left;   RADIOLOGY WITH ANESTHESIA N/A 10/26/2017   Procedure: EMBOLIZATION;  Surgeon: Luanne Bras, MD;  Location: [place];  Service: Radiology;  Laterality: N/A;   SIGMOIDOSCOPY N/A 08/31/2018   Procedure: SIGMOIDOSCOPY-IN  OR;  Surgeon: Robert Bellow, MD;  Location: [facility];  Service: General;  Laterality: N/A;   UPPER GASTROINTESTINAL ENDOSCOPY  2014   VESICOVAGINAL FISTULA CLOSURE W/ TAH      Family History  Problem Relation Age of Onset   Asthma Mother    Kidney failure Mother    Hypertension Daughter    Colon cancer Father        dx in his 80s   Stomach cancer Sister        stage 22   Colon cancer Maternal Uncle    Heart attack Maternal Grandmother    Asthma Brother  Cancer Neg Hx     Social History   Socioeconomic History   Marital status: Widowed    Spouse name: Not on file   Number of children: 1   Years of education: high school   Highest education level: Not on file  Occupational History   Occupation: Engineer, manufacturing systems    Comment: retired  Tobacco Use   Smoking status: Never   Smokeless tobacco: Never  Vaping Use   Vaping Use: Never used  Substance and Sexual Activity   Alcohol use: No    Alcohol/week: 0.0 standard drinks of alcohol   Drug use: No   Sexual activity: Not Currently    Birth control/protection: Surgical  Other Topics Concern   Not on file  Social History Narrative   10/15/19   From: the area   Living: with Daugher [name]) and son-in-law   Work:  retired from [employer] work, 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      Family: lives with daughter, has 1 grandson and 3 great-grandchildren and 1 great-great-grandchild      Enjoys: eat, dance, travel, cooking      Exercise: walk - not as much in cold, will do 1 mile/day   Diet: good, tries to eat health      Safety   Seat belts: Yes    Guns: Yes  and secure   Safe in relationships: Yes    Social Determinants of Health   Financial Resource Strain: Low Risk  (03/01/2022)   Overall Financial Resource Strain (CARDIA)    Difficulty of Paying Living Expenses: Not hard at all  Food Insecurity: No Food Insecurity (03/01/2022)   Hunger Vital Sign    Worried About Running Out of Food in the Last Year: Never true    [place] in the Last Year: Never true  Transportation Needs: No Transportation Needs (03/01/2022)   PRAPARE - Hydrologist (Medical): No    Lack of Transportation (Non-Medical): No  Physical Activity: Insufficiently Active (03/01/2022)   Exercise Vital Sign    Days of Exercise per Week: 2 days    Minutes of Exercise per Session: 30 min  Stress: No Stress Concern Present (03/01/2022)   [place]    Feeling of Stress : Not at all  Social Connections: Moderately Integrated (03/01/2022)   Social Connection and Isolation Panel [NHANES]    Frequency of Communication with Friends and Family: More than three times a week    Frequency of Social Gatherings with Friends and Family: Three times a week    Attends Religious Services: More than 4 times per year    Active Member of Clubs or Organizations: Yes    Attends Archivist Meetings: More than 4 times per year    Marital Status: Widowed  Intimate Partner Violence: Not At Risk (03/01/2022)   Humiliation, Afraid, Rape, and Kick questionnaire    Fear of Current or Ex-Partner: No    Emotionally Abused: No    Physically Abused: No     Sexually Abused: No    Outpatient Medications Prior to Visit  Medication Sig Dispense Refill   amLODipine (NORVASC) 5 MG tablet Take 5 mg by mouth 2 (two) times daily.     aspirin EC 81 MG tablet Take 81 mg by mouth daily.     benzonatate (TESSALON PERLES) 100 MG capsule 1-2 capsules up to twice daily for cough 20 capsule 0  cholecalciferol (VITAMIN D) 1000 UNITS tablet Take 1,000 Units by mouth daily.     clotrimazole-betamethasone (LOTRISONE) cream APPLY TOPICALLY TO THE AFFECTED AREA TWICE DAILY 30 g 3   DULoxetine (CYMBALTA) 30 MG capsule Take 1 capsule (30 mg total) by mouth daily. 30 capsule 2   ferrous sulfate 325 (65 FE) MG tablet Take 325 mg by mouth daily with breakfast.     levothyroxine (SYNTHROID) 75 MCG tablet Take 1 tablet (75 mcg total) by mouth daily before breakfast. 90 tablet 1   linaclotide (LINZESS) 145 MCG CAPS capsule Take 1 capsule (145 mcg total) by mouth daily as needed (for constipation). 90 capsule 3   methocarbamol (ROBAXIN) 750 MG tablet Take 750 mg by mouth 2 (two) times daily.     methocarbamol (ROBAXIN-750) 750 MG tablet Take 1 tablet (750 mg total) by mouth every 6 (six) hours as needed for muscle spasms. 120 tablet 1   vitamin B-12 (CYANOCOBALAMIN) 1000 MCG tablet Take 1,000 mcg by mouth daily.      acetaminophen (TYLENOL) 325 MG tablet Take 650 mg by mouth every 6 (six) hours as needed.     albuterol (VENTOLIN HFA) 108 (90 Base) MCG/ACT inhaler Inhale 2 puffs into the lungs every 6 (six) hours as needed for wheezing or shortness of breath. 18 g 11   levalbuterol (XOPENEX HFA) 45 MCG/ACT inhaler Inhale 1-2 puffs into the lungs every 4 (four) hours as needed for wheezing. 1 Inhaler 0   olmesartan (BENICAR) 40 MG tablet TAKE 1 TABLET(40 MG) BY MOUTH DAILY 90 tablet 3   pantoprazole (PROTONIX) 40 MG tablet TAKE 1 TABLET(40 MG) BY MOUTH DAILY 90 tablet 3   spironolactone (ALDACTONE) 25 MG tablet Take 1 tablet (25 mg total) by mouth daily. For blood pressure. 90  tablet 1   No facility-administered medications prior to visit.    Allergies  Allergen Reactions   Codeine Nausea Only and Other (See Comments)    fever fever fever        Objective:    Physical Exam Constitutional:      General: She is not in acute distress.    Appearance: Normal appearance. She is normal weight. She is not ill-appearing, toxic-appearing or diaphoretic.  Cardiovascular:     Rate and Rhythm: Normal rate and regular rhythm.     Heart sounds:     Gallop present.  Pulmonary:     Effort: Pulmonary effort is normal.     Breath sounds: Normal breath sounds.  Musculoskeletal:     Right lower leg: No edema.     Left lower leg: No edema.  Neurological:     Mental Status: She is alert.     BP (!) 128/58   Pulse (!) 54   Temp 98.6 F (37 C)   Resp 16   Ht 5' 4.5" (1.638 m)   Wt 150 lb 2 oz (68.1 kg)   SpO2 97%   BMI 25.37 kg/m  Wt Readings from Last 3 Encounters:  06/21/22 150 lb 2 oz (68.1 kg)  05/26/22 150 lb (68 kg)  05/16/22 150 lb (68 kg)     Health Maintenance Due  Topic Date Due   Zoster Vaccines- Shingrix (1 of 2) Never done   COVID-19 Vaccine (4 - Pfizer series) 08/31/2020    There are no preventive care reminders to display for this patient.  Lab Results  Component Value Date   TSH 1.62 06/21/2022   Lab Results  Component Value Date   WBC  4.8 04/06/2022   HGB 12.1 04/06/2022   HCT 37.1 04/06/2022   MCV 90.7 04/06/2022   PLT 219 04/06/2022   Lab Results  Component Value Date   NA 141 02/23/2022   K 3.8 02/23/2022   CO2 29 02/23/2022   GLUCOSE 117 (H) 02/23/2022   BUN 16 02/23/2022   CREATININE 1.21 (H) 02/23/2022   BILITOT 0.9 03/31/2021   ALKPHOS 66 03/31/2021   AST 16 03/31/2021   ALT 13 03/31/2021   PROT 7.6 03/31/2021   ALBUMIN 4.1 03/31/2021   CALCIUM 10.4 02/23/2022   ANIONGAP 10 03/31/2021   GFR 42.11 (L) 02/23/2022   Lab Results  Component Value Date   CHOL 177 08/18/2017   Lab Results  Component  Value Date   HDL 61 08/18/2017   Lab Results  Component Value Date   LDLCALC 92 08/18/2017   Lab Results  Component Value Date   TRIG 141 08/18/2017   No results found for: "CHOLHDL" Lab Results  Component Value Date   HGBA1C 5.8 (A) 04/09/2020      Assessment & Plan:   Problem List Items Addressed This Visit       Cardiovascular and Mediastinum   Essential hypertension, benign    Refill olmesartan 40 mg once daily Refill spironolactone 25 mg once daily  Will closely watch potassium due to hyperkalemic risk drugs        Relevant Medications   spironolactone (ALDACTONE) 25 MG tablet   olmesartan (BENICAR) 40 MG tablet   Hot flashes    Will check thyroid today Advised pt unlikely due to hormonal fluctations, will r/o other etiologies      Relevant Medications   spironolactone (ALDACTONE) 25 MG tablet   olmesartan (BENICAR) 40 MG tablet     Respiratory   Elevated hemidiaphragm    Evaluate for CHF  Ordering echo and chest xray       Relevant Orders   DG Chest 2 View   ECHOCARDIOGRAM COMPLETE     Digestive   Gastroesophageal reflux disease - Primary    Trial dexilant If able to get covered as protonix no longer working Try to decrease and or avoid spicy foods, fried fatty foods, and also caffeine and chocolate as these can increase heartburn symptoms.        Relevant Medications   Dexlansoprazole (DEXILANT) 30 MG capsule DR     Endocrine   Hypothyroidism    Order tsh pending results      Relevant Orders   TSH (Completed)   RESOLVED: Carotid body tumor ([facility])     Nervous and Auditory   Chronic radicular pain of lower back    Continue robaxin prn  Pt states for now not wanting to try duloxetine, but will consider if needed in future      Relevant Medications   methocarbamol (ROBAXIN) 750 MG tablet     Other   Bilateral leg edema    cxr with right hemidiaphragm elevation, ordering BNP pending results Also ordering echocardiogram for DOE and  chroni ccough to r/o CHF      Chronic cough    Repeat cxr for abn cxr in June Consider CT chest if necessary  Tessalon perrles prn  Change made to switch PPI  To see if this helps with GERD as well as may be contributing to cough.      Relevant Orders   DG Chest 2 View   History of carotid body tumor    Continue f/u with vascular  as scheduled       Shortness of breath   Relevant Medications   albuterol (VENTOLIN HFA) 108 (90 Base) MCG/ACT inhaler   Other Relevant Orders   ECHOCARDIOGRAM COMPLETE   Abnormal chest x-ray    Reviewed cxr June 2023  Ordering cxr today to monitor      Relevant Orders   DG Chest 2 View   ECHOCARDIOGRAM COMPLETE   RESOLVED: Paraganglioma ([place])   Other Visit Diagnoses     DOE (dyspnea on exertion)       Relevant Orders   Brain natriuretic peptide (Completed)   Heat intolerance       Relevant Orders   Lactate Dehydrogenase (Completed)       Meds ordered this encounter  Medications   spironolactone (ALDACTONE) 25 MG tablet    Sig: Take 1 tablet (25 mg total) by mouth daily. For blood pressure.    Dispense:  90 tablet    Refill:  1   olmesartan (BENICAR) 40 MG tablet    Sig: TAKE 1 TABLET(40 MG) BY MOUTH DAILY    Dispense:  90 tablet    Refill:  3   albuterol (VENTOLIN HFA) 108 (90 Base) MCG/ACT inhaler    Sig: Inhale 2 puffs into the lungs every 6 (six) hours as needed for wheezing or shortness of breath.    Dispense:  18 g    Refill:  11    Order Specific Question:   Supervising Provider    Answer:   Diona Browner, AMY E [[id number]]   Dexlansoprazole (DEXILANT) 30 MG capsule DR    Sig: Take 1 capsule (30 mg total) by mouth daily.    Dispense:  30 capsule    Refill:  2    Order Specific Question:   Supervising Provider    Answer:   Diona Browner, AMY E [[id number]]    Follow-up: Return in about 3 months (around 09/21/2022) for f/u thyroid .    Eugenia Pancoast, FNP

## 2022-06-21 NOTE — Assessment & Plan Note (Addendum)
Refill olmesartan 40 mg once daily Refill spironolactone 25 mg once daily  Will closely watch potassium due to hyperkalemic risk drugs

## 2022-06-21 NOTE — Assessment & Plan Note (Signed)
Repeat cxr for abn cxr in June Consider CT chest if necessary  Tessalon perrles prn  Change made to switch PPI  To see if this helps with GERD as well as may be contributing to cough.

## 2022-06-21 NOTE — Assessment & Plan Note (Signed)
cxr with right hemidiaphragm elevation, ordering BNP pending results Also ordering echocardiogram for DOE and chroni ccough to r/o CHF

## 2022-06-21 NOTE — Assessment & Plan Note (Signed)
Will check thyroid today Advised pt unlikely due to hormonal fluctations, will r/o other etiologies

## 2022-06-21 NOTE — Patient Instructions (Signed)
  Stop pantoprazole  Start dexilant if approved. If not approved let me know.   Due to recent changes in healthcare laws, you may see results of your imaging and/or laboratory studies on MyChart before I have had a chance to review them.  I understand that in some cases there may be results that are confusing or concerning to you. Please understand that not all results are received at the same time and often I may need to interpret multiple results in order to provide you with the best plan of care or course of treatment. Therefore, I ask that you please give me 2 business days to thoroughly review all your results before contacting my office for clarification. Should we see a critical lab result, you will be contacted sooner.   It was a pleasure seeing you today! Please do not hesitate to reach out with any questions and or concerns.  Regards,   Eugenia Pancoast FNP-C

## 2022-06-22 LAB — LACTATE DEHYDROGENASE: LDH: 109 U/L — ABNORMAL LOW (ref 120–250)

## 2022-06-22 NOTE — Assessment & Plan Note (Signed)
Reviewed cxr June 2023  Ordering cxr today to monitor

## 2022-06-22 NOTE — Assessment & Plan Note (Signed)
Continue f/u with vascular as scheduled

## 2022-06-22 NOTE — Assessment & Plan Note (Signed)
Evaluate for CHF  Ordering echo and chest xray

## 2022-06-22 NOTE — Assessment & Plan Note (Signed)
Continue robaxin prn  Pt states for now not wanting to try duloxetine, but will consider if needed in future

## 2022-06-22 NOTE — Assessment & Plan Note (Signed)
Order tsh pending results  

## 2022-06-22 NOTE — Assessment & Plan Note (Signed)
Trial dexilant If able to get covered as protonix no longer working Try to decrease and or avoid spicy foods, fried fatty foods, and also caffeine and chocolate as these can increase heartburn symptoms.

## 2022-07-01 ENCOUNTER — Encounter: Payer: Medicare HMO | Attending: Physical Medicine & Rehabilitation | Admitting: Physical Medicine & Rehabilitation

## 2022-07-01 ENCOUNTER — Encounter: Payer: Self-pay | Admitting: Physical Medicine & Rehabilitation

## 2022-07-01 VITALS — BP 152/74 | HR 56 | Ht 64.5 in | Wt 150.8 lb

## 2022-07-01 DIAGNOSIS — D447 Neoplasm of uncertain behavior of aortic body and other paraganglia: Secondary | ICD-10-CM | POA: Insufficient documentation

## 2022-07-01 DIAGNOSIS — M5441 Lumbago with sciatica, right side: Secondary | ICD-10-CM | POA: Diagnosis not present

## 2022-07-01 DIAGNOSIS — G8929 Other chronic pain: Secondary | ICD-10-CM | POA: Insufficient documentation

## 2022-07-01 MED ORDER — METHOCARBAMOL 750 MG PO TABS: 750.0000 mg | ORAL_TABLET | Freq: Two times a day (BID) | ORAL | 3 refills | Status: DC | PRN

## 2022-07-01 NOTE — Progress Notes (Unsigned)
Subjective:    Patient ID: Heather Cobb, female    DOB: 24-May-1941, 81 y.o.   MRN: [id number]  HPI HPI 05/12/22 Heather Cobb is a 81 year old female with past medical history of paraganglioma, CKD, prediabetes who is here for back pain that has been worsening for about 3 months.  Patient was previously active and taking walks of about a mile.  She developed pain in her back that shoots down her leg to her lateral ankle on the right.  Pain is worse with walking and standing.  Bending forward or backwards does not change the pain.  She tried gabapentin 100 mg 3 times daily however this did not help her pain and resulted in sedation so she stopped using this.  Extra strength Tylenol provides some benefit, she does not take NSAIDs due to her kidney disease.  No bowel or bladder changes, no new numbness or weakness.  She did try a couple doses of Robaxin and found that this helped her pain.  Interval History Pain on the right lower back improved with as needed methocarbomol   Pain Inventory Average Pain 8 Pain Right Now 0 My pain is  other (not indicated what other is)  In the last 24 hours, has pain interfered with the following? General activity 0 Relation with others 0 Enjoyment of life 0 What TIME of day is your pain at its worst? No pain Sleep (in general) Good  Pain is worse with: walking, bending, and standing Pain improves with: medication Relief from Meds: 9  Family History  Problem Relation Age of Onset   Asthma Mother    Kidney failure Mother    Hypertension Daughter    Colon cancer Father        dx in his 51s   Stomach cancer Sister        stage 4   Colon cancer Maternal Uncle    Heart attack Maternal Grandmother    Asthma Brother    Cancer Neg Hx    Social History   Socioeconomic History   Marital status: Widowed    Spouse name: Not on file   Number of children: 1   Years of education: high school   Highest education level: Not on file  Occupational History    Occupation: Engineer, manufacturing systems    Comment: retired  Tobacco Use   Smoking status: Never   Smokeless tobacco: Never  Vaping Use   Vaping Use: Never used  Substance and Sexual Activity   Alcohol use: No    Alcohol/week: 0.0 standard drinks of alcohol   Drug use: No   Sexual activity: Not Currently    Birth control/protection: Surgical  Other Topics Concern   Not on file  Social History Narrative   10/15/19   From: the area   Living: with Daugher [name]) and son-in-law   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, 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      Family: lives with daughter, has 1 grandson and 3 great-grandchildren and 1 great-great-grandchild      Enjoys: eat, dance, travel, cooking      Exercise: walk - not as much in cold, will do 1 mile/day   Diet: good, tries to eat health      Safety   Seat belts: Yes    Guns: Yes  and secure   Safe in relationships: Yes    Social Determinants of Health   Financial Resource Strain: Low Risk  (03/01/2022)   Overall Emergency planning/management officer Strain (  CARDIA)    Difficulty of Paying Living Expenses: Not hard at all  Food Insecurity: No Food Insecurity (03/01/2022)   Hunger Vital Sign    Worried About Running Out of Food in the Last Year: Never true    Ran Out of Food in the Last Year: Never true  Transportation Needs: No Transportation Needs (03/01/2022)   PRAPARE - Hydrologist (Medical): No    Lack of Transportation (Non-Medical): No  Physical Activity: Insufficiently Active (03/01/2022)   Exercise Vital Sign    Days of Exercise per Week: 2 days    Minutes of Exercise per Session: 30 min  Stress: No Stress Concern Present (03/01/2022)   [place]    Feeling of Stress : Not at all  Social Connections: Moderately Integrated (03/01/2022)   Social Connection and Isolation Panel [NHANES]    Frequency of Communication with Friends and Family: More than three times  a week    Frequency of Social Gatherings with Friends and Family: Three times a week    Attends Religious Services: More than 4 times per year    Active Member of Clubs or Organizations: Yes    Attends Archivist Meetings: More than 4 times per year    Marital Status: Widowed   Past Surgical History:  Procedure Laterality Date   ABDOMINAL HYSTERECTOMY     CATARACT EXTRACTION W/ INTRAOCULAR LENS  IMPLANT, BILATERAL     CHOLECYSTECTOMY N/A 04/15/2015   Procedure: LAPAROSCOPIC CHOLECYSTECTOMY;  Surgeon: Aviva Signs, MD;  Location: [facility];  Service: General;  Laterality: N/A;   COLONOSCOPY  2012   [place]   COLONOSCOPY WITH PROPOFOL N/A 02/03/2016   Procedure: COLONOSCOPY WITH PROPOFOL;  Surgeon: Robert Bellow, MD;  Location: [facility];  Service: Endoscopy;  Laterality: N/A;   EYE SURGERY Bilateral    cataract extractions   HEMORRHOID SURGERY N/A 08/31/2018   Procedure: HEMORRHOIDECTOMY;  Surgeon: Robert Bellow, MD;  Location: [facility];  Service: General;  Laterality: N/A;   IR ANGIO EXTERNAL CAROTID SEL EXT CAROTID UNI L MOD SED  10/26/2017   IR ANGIO INTRA EXTRACRAN SEL COM CAROTID INNOMINATE BILAT MOD SED  10/26/2017   IR ANGIO VERTEBRAL SEL VERTEBRAL UNI R MOD SED  10/26/2017   IR ANGIOGRAM EXTREMITY LEFT  10/26/2017   IR ANGIOGRAM FOLLOW UP STUDY  10/26/2017   IR NEURO EACH ADD'L AFTER BASIC UNI LEFT (MS)  10/26/2017   IR RADIOLOGIST EVAL & MGMT  09/21/2017   IR TRANSCATH/EMBOLIZ  10/26/2017   MASS EXCISION Left 10/27/2017   Procedure: EXCISION CAROTID BODY TUMOR;  Surgeon: Melida Quitter, MD;  Location: [place];  Service: ENT;  Laterality: Left;   RADIOLOGY WITH ANESTHESIA N/A 10/26/2017   Procedure: EMBOLIZATION;  Surgeon: Luanne Bras, MD;  Location: [place];  Service: Radiology;  Laterality: N/A;   SIGMOIDOSCOPY N/A 08/31/2018   Procedure: SIGMOIDOSCOPY-IN  OR;  Surgeon: Robert Bellow, MD;  Location: [facility];  Service: General;  Laterality: N/A;   UPPER  GASTROINTESTINAL ENDOSCOPY  2014   VESICOVAGINAL FISTULA CLOSURE W/ TAH     Past Surgical History:  Procedure Laterality Date   ABDOMINAL HYSTERECTOMY     CATARACT EXTRACTION W/ INTRAOCULAR LENS  IMPLANT, BILATERAL     CHOLECYSTECTOMY N/A 04/15/2015   Procedure: LAPAROSCOPIC CHOLECYSTECTOMY;  Surgeon: Aviva Signs, MD;  Location: [facility];  Service: General;  Laterality: N/A;   COLONOSCOPY  2012   [place]  COLONOSCOPY WITH PROPOFOL N/A 02/03/2016   Procedure: COLONOSCOPY WITH PROPOFOL;  Surgeon: Robert Bellow, MD;  Location: [facility];  Service: Endoscopy;  Laterality: N/A;   EYE SURGERY Bilateral    cataract extractions   HEMORRHOID SURGERY N/A 08/31/2018   Procedure: HEMORRHOIDECTOMY;  Surgeon: Robert Bellow, MD;  Location: [facility];  Service: General;  Laterality: N/A;   IR ANGIO EXTERNAL CAROTID SEL EXT CAROTID UNI L MOD SED  10/26/2017   IR ANGIO INTRA EXTRACRAN SEL COM CAROTID INNOMINATE BILAT MOD SED  10/26/2017   IR ANGIO VERTEBRAL SEL VERTEBRAL UNI R MOD SED  10/26/2017   IR ANGIOGRAM EXTREMITY LEFT  10/26/2017   IR ANGIOGRAM FOLLOW UP STUDY  10/26/2017   IR NEURO EACH ADD'L AFTER BASIC UNI LEFT (MS)  10/26/2017   IR RADIOLOGIST EVAL & MGMT  09/21/2017   IR TRANSCATH/EMBOLIZ  10/26/2017   MASS EXCISION Left 10/27/2017   Procedure: EXCISION CAROTID BODY TUMOR;  Surgeon: Melida Quitter, MD;  Location: [place];  Service: ENT;  Laterality: Left;   RADIOLOGY WITH ANESTHESIA N/A 10/26/2017   Procedure: EMBOLIZATION;  Surgeon: Luanne Bras, MD;  Location: [place];  Service: Radiology;  Laterality: N/A;   SIGMOIDOSCOPY N/A 08/31/2018   Procedure: SIGMOIDOSCOPY-IN  OR;  Surgeon: Robert Bellow, MD;  Location: [facility];  Service: General;  Laterality: N/A;   UPPER GASTROINTESTINAL ENDOSCOPY  2014   VESICOVAGINAL FISTULA CLOSURE W/ TAH     Past Medical History:  Diagnosis Date   Anemia    Carotid body tumor ([place])    left   Family history of colon cancer    Hypertension     Hypothyroid    Pyelonephritis    Rectal bleeding 11/26/2015   Vertigo    Vitamin B 12 deficiency    BP (!) 152/74   Pulse (!) 56   Ht 5' 4.5" (1.638 m)   Wt 150 lb 12.8 oz (68.4 kg)   SpO2 93%   BMI 25.49 kg/m   Opioid Risk Score:   Fall Risk Score:  `1  Depression screen [facility] 2/9     07/01/2022   10:13 AM 05/12/2022   10:51 AM 03/01/2022    1:43 PM 01/14/2021   10:25 AM 10/15/2019    9:34 AM 11/14/2017    8:21 AM  Depression screen PHQ 2/9  Decreased Interest 0 3 0 0 0 0  Down, Depressed, Hopeless 0 0 0 0 0 0  PHQ - 2 Score 0 3 0 0 0 0  Altered sleeping  0      Tired, decreased energy  0      Change in appetite  0      Feeling bad or failure about yourself   0      Trouble concentrating  0      Moving slowly or fidgety/restless  0      Suicidal thoughts  0      PHQ-9 Score  3         Review of Systems  Constitutional: Negative.   HENT: Negative.    Eyes: Negative.   Respiratory: Negative.    Cardiovascular: Negative.   Gastrointestinal: Negative.   Endocrine: Negative.   Genitourinary: Negative.   Musculoskeletal:  Positive for back pain.       Not currently hurting  Skin: Negative.   Allergic/Immunologic: Negative.   Neurological: Negative.   Hematological: Negative.   Psychiatric/Behavioral: Negative.    All other systems reviewed and are negative.  Objective:   Physical Exam  Gen: no distress, normal appearing HEENT: oral mucosa pink and moist, NCAT Cardio: Reg rate Chest: normal effort, normal rate of breathing Abd: soft, non-distended Ext: no edema Psych: very pleasant, normal affect Skin: intact Neuro: Alert and oriented , follows commands, answers questions, sensation intact light touch in all 4 extremities, Strength 5 out of 5 in all 4 extremities Musculoskeletal:  Slump test negative b/l No significant lumbar paraspinal tenderness noted Facet loading negative   L spine MRI 02/18/22 Vertebrae: Marrow space lesions affecting T12, L1,  L4 and L5, most extensive at L4. I think these most likely represent atypical hemangiomas. However, the possibility of osseous metastatic disease is not completely ruled out.   Conus medullaris and cauda equina: Conus extends to the L1 level. Conus and cauda equina appear normal.   Paraspinal and other soft tissues: Negative   Disc levels:   T11-12 through L1-2: Normal   L2-3: Minimal disc bulge.  Minimal facet hypertrophy.  No stenosis.   L3-4: Mild bulging of the disc. Mild facet hypertrophy. No stenosis.   L4-5: Mild bulging of the disc. Mild facet and ligamentous hypertrophy. No compressive stenosis.   L5-S1: Shallow protrusion of the disc. Mild indentation of the thecal sac. Mild narrowing of the lateral recesses and foramina. Foraminal encroachment is more pronounced on the right, where the right L5 nerve could be affected.   IMPRESSION: L5-S1: Shallow disc protrusion. Mild indentation of the thecal sac with mild stenosis of the subarticular lateral recesses and neural foramina. Foraminal encroachment is more pronounced on the right, where the right L5 nerve could be irritated.   Mild, non-compressive degenerative changes from L2-3 through L4-5 as described above.   Marrow space signal abnormalities at T12, L1, L4 and L5. I think these probably represent atypical hemangiomas. The possibility of metastatic disease does exist but is not favored. Does the patient have any known malignancy?      Assessment & Plan:   Chronic lower back pain with right radiculopathy likely L5.  Lumbar spondylosis with MRI with L5-S1 disc protrusion with mild indentation of thecal sac and foraminal narrowing. -Gabapentin stopped previously due to sedation -Pt did not start duloxetine 30 mg , may be option at later time if needed -Continue Robaxin 750 mg as needed   History of paraganglioma followed by oncology.  -Oncology referral placed last visit, oncology note 05/12/22 reports they  reviewed  recent MRI and lesions consistent with hemangiomas, no further workup recommended

## 2022-08-25 ENCOUNTER — Encounter: Payer: Self-pay | Admitting: Family

## 2022-08-25 DIAGNOSIS — E039 Hypothyroidism, unspecified: Secondary | ICD-10-CM

## 2022-08-25 MED ORDER — BENZONATATE 100 MG PO CAPS: ORAL_CAPSULE | ORAL | 0 refills | Status: DC

## 2022-08-25 MED ORDER — LEVOTHYROXINE SODIUM 75 MCG PO TABS: 75.0000 ug | ORAL_TABLET | Freq: Every day | ORAL | 1 refills | Status: DC

## 2022-09-22 ENCOUNTER — Ambulatory Visit: Payer: Medicare HMO | Admitting: Family

## 2022-09-29 ENCOUNTER — Encounter: Payer: Self-pay | Admitting: Family

## 2022-09-29 ENCOUNTER — Ambulatory Visit (INDEPENDENT_AMBULATORY_CARE_PROVIDER_SITE_OTHER): Payer: Medicare PPO | Admitting: Family

## 2022-09-29 VITALS — BP 130/82 | HR 82 | Temp 98.6°F | Ht 64.5 in | Wt 147.0 lb

## 2022-09-29 DIAGNOSIS — M5416 Radiculopathy, lumbar region: Secondary | ICD-10-CM

## 2022-09-29 DIAGNOSIS — I10 Essential (primary) hypertension: Secondary | ICD-10-CM

## 2022-09-29 DIAGNOSIS — G8929 Other chronic pain: Secondary | ICD-10-CM | POA: Diagnosis not present

## 2022-09-29 DIAGNOSIS — M5441 Lumbago with sciatica, right side: Secondary | ICD-10-CM | POA: Diagnosis not present

## 2022-09-29 DIAGNOSIS — J3089 Other allergic rhinitis: Secondary | ICD-10-CM | POA: Diagnosis not present

## 2022-09-29 DIAGNOSIS — K219 Gastro-esophageal reflux disease without esophagitis: Secondary | ICD-10-CM

## 2022-09-29 DIAGNOSIS — R053 Chronic cough: Secondary | ICD-10-CM | POA: Diagnosis not present

## 2022-09-29 DIAGNOSIS — E538 Deficiency of other specified B group vitamins: Secondary | ICD-10-CM

## 2022-09-29 DIAGNOSIS — E039 Hypothyroidism, unspecified: Secondary | ICD-10-CM | POA: Diagnosis not present

## 2022-09-29 LAB — COMPREHENSIVE METABOLIC PANEL
ALT: 7 U/L (ref 0–35)
AST: 12 U/L (ref 0–37)
Albumin: 4.4 g/dL (ref 3.5–5.2)
Alkaline Phosphatase: 73 U/L (ref 39–117)
BUN: 16 mg/dL (ref 6–23)
CO2: 33 mEq/L — ABNORMAL HIGH (ref 19–32)
Calcium: 10.2 mg/dL (ref 8.4–10.5)
Chloride: 105 mEq/L (ref 96–112)
Creatinine, Ser: 1.12 mg/dL (ref 0.40–1.20)
GFR: 46.01 mL/min — ABNORMAL LOW (ref >60.00)
Glucose, Bld: 95 mg/dL (ref 70–99)
Potassium: 3.7 mEq/L (ref 3.5–5.1)
Sodium: 145 mEq/L (ref 135–145)
Total Bilirubin: 0.6 mg/dL (ref 0.2–1.2)
Total Protein: 6.9 g/dL (ref 6.0–8.3)

## 2022-09-29 LAB — CBC
HCT: 38.2 % (ref 36.0–46.0)
Hemoglobin: 12.8 g/dL (ref 12.0–15.0)
MCHC: 33.4 g/dL (ref 30.0–36.0)
MCV: 86.3 fl (ref 78.0–100.0)
Platelets: 244 10*3/uL (ref 150.0–400.0)
RBC: 4.42 Mil/uL (ref 3.87–5.11)
RDW: 14.1 % (ref 11.5–15.5)
WBC: 5 10*3/uL (ref 4.0–10.5)

## 2022-09-29 LAB — TSH: TSH: 0.79 u[IU]/mL (ref 0.35–5.50)

## 2022-09-29 LAB — MICROALBUMIN / CREATININE URINE RATIO
Creatinine,U: 157.5 mg/dL
Microalb Creat Ratio: 0.9 mg/g (ref 0.0–30.0)
Microalb, Ur: 1.4 mg/dL (ref 0.0–1.9)

## 2022-09-29 LAB — VITAMIN B12: Vitamin B-12: 1226 pg/mL — ABNORMAL HIGH (ref 211–911)

## 2022-09-29 MED ORDER — FAMOTIDINE 10 MG PO TABS: 10.0000 mg | ORAL_TABLET | Freq: Two times a day (BID) | ORAL | 1 refills | Status: DC

## 2022-09-29 MED ORDER — LEVOTHYROXINE SODIUM 75 MCG PO TABS: 75.0000 ug | ORAL_TABLET | Freq: Every day | ORAL | 1 refills | Status: DC

## 2022-09-29 MED ORDER — FLUTICASONE PROPIONATE 50 MCG/ACT NA SUSP: 2.0000 | Freq: Every day | NASAL | 6 refills | Status: AC

## 2022-09-29 MED ORDER — CLOTRIMAZOLE-BETAMETHASONE 1-0.05 % EX CREA: TOPICAL_CREAM | CUTANEOUS | 2 refills | Status: DC

## 2022-09-29 NOTE — Assessment & Plan Note (Signed)
Continue olmesartan 40 mg once daily Continue spironolactone 25 mg once daily  Will closely watch potassium due to hyperkalemic risk drugs

## 2022-09-29 NOTE — Assessment & Plan Note (Signed)
Suspect this is due to a mixture of GERD symptoms as well as allergic rhinitis.

## 2022-09-29 NOTE — Progress Notes (Signed)
Established Patient Office Visit  Subjective:  Patient ID: Heather Cobb, female    DOB: [birth year]  Age: 82 y.o. MRN: [id number]  CC:  Chief Complaint  Patient presents with   Medication Refill    Patient due for medication refills     HPI Heather Cobb is here today for follow up.   Pt is with acute concerns.  GERD: dexilant, has been on for the last two months. She does admit to eating spicy foods as well.   Chronic right sided sciatica, intermittently. She states mainly on the right side when it occurs. Taking robaxin twice daily as she thought she was supposed to take daily. She has not yet started duloxetine as   Past Medical History:  Diagnosis Date   Anemia    Carotid body tumor ([place])    left   Family history of colon cancer    Hypertension    Hypothyroid    Pyelonephritis    Rectal bleeding 11/26/2015   Vertigo    Vitamin B 12 deficiency     Past Surgical History:  Procedure Laterality Date   ABDOMINAL HYSTERECTOMY     CATARACT EXTRACTION W/ INTRAOCULAR LENS  IMPLANT, BILATERAL     CHOLECYSTECTOMY N/A 04/15/2015   Procedure: LAPAROSCOPIC CHOLECYSTECTOMY;  Surgeon: Aviva Signs, MD;  Location: [facility];  Service: General;  Laterality: N/A;   COLONOSCOPY  2012   [place]   COLONOSCOPY WITH PROPOFOL N/A 02/03/2016   Procedure: COLONOSCOPY WITH PROPOFOL;  Surgeon: Robert Bellow, MD;  Location: [facility];  Service: Endoscopy;  Laterality: N/A;   EYE SURGERY Bilateral    cataract extractions   HEMORRHOID SURGERY N/A 08/31/2018   Procedure: HEMORRHOIDECTOMY;  Surgeon: Robert Bellow, MD;  Location: [facility];  Service: General;  Laterality: N/A;   IR ANGIO EXTERNAL CAROTID SEL EXT CAROTID UNI L MOD SED  10/26/2017   IR ANGIO INTRA EXTRACRAN SEL COM CAROTID INNOMINATE BILAT MOD SED  10/26/2017   IR ANGIO VERTEBRAL SEL VERTEBRAL UNI R MOD SED  10/26/2017   IR ANGIOGRAM EXTREMITY LEFT  10/26/2017   IR ANGIOGRAM FOLLOW UP STUDY  10/26/2017   IR NEURO EACH ADD'L  AFTER BASIC UNI LEFT (MS)  10/26/2017   IR RADIOLOGIST EVAL & MGMT  09/21/2017   IR TRANSCATH/EMBOLIZ  10/26/2017   MASS EXCISION Left 10/27/2017   Procedure: EXCISION CAROTID BODY TUMOR;  Surgeon: Melida Quitter, MD;  Location: [facility];  Service: ENT;  Laterality: Left;   RADIOLOGY WITH ANESTHESIA N/A 10/26/2017   Procedure: EMBOLIZATION;  Surgeon: Luanne Bras, MD;  Location: [facility];  Service: Radiology;  Laterality: N/A;   SIGMOIDOSCOPY N/A 08/31/2018   Procedure: SIGMOIDOSCOPY-IN  OR;  Surgeon: Robert Bellow, MD;  Location: [facility];  Service: General;  Laterality: N/A;   UPPER GASTROINTESTINAL ENDOSCOPY  2014   VESICOVAGINAL FISTULA CLOSURE W/ TAH      Family History  Problem Relation Age of Onset   Asthma Mother    Kidney failure Mother    Hypertension Daughter    Colon cancer Father        dx in his 105s   Stomach cancer Sister        stage 4   Colon cancer Maternal Uncle    Heart attack Maternal Grandmother    Asthma Brother    Cancer Neg Hx     Social History   Socioeconomic History   Marital status: Widowed    Spouse name: Not on file  Number of children: 1   Years of education: high school   Highest education level: Not on file  Occupational History   Occupation: Engineer, manufacturing systems    Comment: retired  Tobacco Use   Smoking status: Never   Smokeless tobacco: Never  Vaping Use   Vaping Use: Never used  Substance and Sexual Activity   Alcohol use: No    Alcohol/week: 0.0 standard drinks of alcohol   Drug use: No   Sexual activity: Not Currently    Birth control/protection: Surgical  Other Topics Concern   Not on file  Social History Narrative   10/15/19   From: the area   Living: with Daugher [name]) and son-in-law   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, 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      Family: lives with daughter, has 1 grandson and 3 great-grandchildren and 1 great-great-grandchild      Enjoys: eat, dance, travel, cooking      Exercise: walk - not as much  in cold, will do 1 mile/day   Diet: good, tries to eat health      Safety   Seat belts: Yes    Guns: Yes  and secure   Safe in relationships: Yes    Social Determinants of Health   Financial Resource Strain: Low Risk  (03/01/2022)   Overall Financial Resource Strain (CARDIA)    Difficulty of Paying Living Expenses: Not hard at all  Food Insecurity: No Food Insecurity (03/01/2022)   Hunger Vital Sign    Worried About Running Out of Food in the Last Year: Never true    [place] in the Last Year: Never true  Transportation Needs: No Transportation Needs (03/01/2022)   PRAPARE - Hydrologist (Medical): No    Lack of Transportation (Non-Medical): No  Physical Activity: Insufficiently Active (03/01/2022)   Exercise Vital Sign    Days of Exercise per Week: 2 days    Minutes of Exercise per Session: 30 min  Stress: No Stress Concern Present (03/01/2022)   [place]    Feeling of Stress : Not at all  Social Connections: Moderately Integrated (03/01/2022)   Social Connection and Isolation Panel [NHANES]    Frequency of Communication with Friends and Family: More than three times a week    Frequency of Social Gatherings with Friends and Family: Three times a week    Attends Religious Services: More than 4 times per year    Active Member of Clubs or Organizations: Yes    Attends Archivist Meetings: More than 4 times per year    Marital Status: Widowed  Intimate Partner Violence: Not At Risk (03/01/2022)   Humiliation, Afraid, Rape, and Kick questionnaire    Fear of Current or Ex-Partner: No    Emotionally Abused: No    Physically Abused: No    Sexually Abused: No    Outpatient Medications Prior to Visit  Medication Sig Dispense Refill   albuterol (VENTOLIN HFA) 108 (90 Base) MCG/ACT inhaler Inhale 2 puffs into the lungs every 6 (six) hours as needed for wheezing or shortness of  breath. 18 g 11   amLODipine (NORVASC) 5 MG tablet Take 5 mg by mouth 2 (two) times daily.     aspirin EC 81 MG tablet Take 81 mg by mouth daily.     benzonatate (TESSALON PERLES) 100 MG capsule 1-2 capsules up to twice daily for cough 20 capsule  0   cholecalciferol (VITAMIN D) 1000 UNITS tablet Take 1,000 Units by mouth daily.     Dexlansoprazole (DEXILANT) 30 MG capsule DR Take 1 capsule (30 mg total) by mouth daily. 30 capsule 2   DULoxetine (CYMBALTA) 30 MG capsule Take 1 capsule (30 mg total) by mouth daily. 30 capsule 2   ferrous sulfate 325 (65 FE) MG tablet Take 325 mg by mouth daily with breakfast.     linaclotide (LINZESS) 145 MCG CAPS capsule Take 1 capsule (145 mcg total) by mouth daily as needed (for constipation). 90 capsule 3   methocarbamol (ROBAXIN-750) 750 MG tablet Take 1 tablet (750 mg total) by mouth every 12 (twelve) hours as needed for muscle spasms. 60 tablet 3   olmesartan (BENICAR) 40 MG tablet TAKE 1 TABLET(40 MG) BY MOUTH DAILY 90 tablet 3   spironolactone (ALDACTONE) 25 MG tablet Take 1 tablet (25 mg total) by mouth daily. For blood pressure. 90 tablet 1   vitamin B-12 (CYANOCOBALAMIN) 1000 MCG tablet Take 1,000 mcg by mouth daily.      clotrimazole-betamethasone (LOTRISONE) cream APPLY TOPICALLY TO THE AFFECTED AREA TWICE DAILY 30 g 3   levothyroxine (SYNTHROID) 75 MCG tablet Take 1 tablet (75 mcg total) by mouth daily before breakfast. 90 tablet 1   No facility-administered medications prior to visit.    Allergies  Allergen Reactions   Codeine Nausea Only and Other (See Comments)    fever fever fever    ROS: Pertinent symptoms negative unless otherwise noted in HPI    Objective:    Physical Exam Constitutional:      General: She is not in acute distress.    Appearance: Normal appearance. She is not ill-appearing.  HENT:     Right Ear: Tympanic membrane normal.     Left Ear: Tympanic membrane normal.     Nose: Nose normal. No congestion or  rhinorrhea.     Right Turbinates: Enlarged and swollen.     Left Turbinates: Enlarged and swollen.     Right Sinus: No maxillary sinus tenderness or frontal sinus tenderness.     Left Sinus: No maxillary sinus tenderness or frontal sinus tenderness.     Mouth/Throat:     Mouth: Mucous membranes are moist.     Pharynx: No pharyngeal swelling, oropharyngeal exudate or posterior oropharyngeal erythema.     Tonsils: No tonsillar exudate.  Eyes:     Extraocular Movements: Extraocular movements intact.     Conjunctiva/sclera: Conjunctivae normal.     Pupils: Pupils are equal, round, and reactive to light.  Neck:     Thyroid: No thyroid mass.  Cardiovascular:     Rate and Rhythm: Normal rate and regular rhythm.  Pulmonary:     Effort: Pulmonary effort is normal.     Breath sounds: Normal breath sounds.  Lymphadenopathy:     Cervical:     Right cervical: No superficial cervical adenopathy.    Left cervical: No superficial cervical adenopathy.  Neurological:     Mental Status: She is alert.     BP 130/82   Pulse 82   Temp 98.6 F (37 C) (Oral)   Ht 5' 4.5" (1.638 m)   Wt 147 lb (66.7 kg)   SpO2 98%   BMI 24.84 kg/m  Wt Readings from Last 3 Encounters:  09/29/22 147 lb (66.7 kg)  07/01/22 150 lb 12.8 oz (68.4 kg)  06/21/22 150 lb 2 oz (68.1 kg)     Health Maintenance Due  Topic Date Due  DTaP/Tdap/Td (1 - Tdap) Never done   Zoster Vaccines- Shingrix (1 of 2) Never done   COVID-19 Vaccine (4 - 2023-24 season) 05/20/2022    There are no preventive care reminders to display for this patient.  Lab Results  Component Value Date   TSH 1.62 06/21/2022   Lab Results  Component Value Date   WBC 4.8 04/06/2022   HGB 12.1 04/06/2022   HCT 37.1 04/06/2022   MCV 90.7 04/06/2022   PLT 219 04/06/2022   Lab Results  Component Value Date   NA 141 02/23/2022   K 3.8 02/23/2022   CO2 29 02/23/2022   GLUCOSE 117 (H) 02/23/2022   BUN 16 02/23/2022   CREATININE 1.21 (H)  02/23/2022   BILITOT 0.9 03/31/2021   ALKPHOS 66 03/31/2021   AST 16 03/31/2021   ALT 13 03/31/2021   PROT 7.6 03/31/2021   ALBUMIN 4.1 03/31/2021   CALCIUM 10.4 02/23/2022   ANIONGAP 10 03/31/2021   GFR 42.11 (L) 02/23/2022   Lab Results  Component Value Date   CHOL 177 08/18/2017   Lab Results  Component Value Date   HDL 61 08/18/2017   Lab Results  Component Value Date   LDLCALC 92 08/18/2017   Lab Results  Component Value Date   TRIG 141 08/18/2017   No results found for: "CHOLHDL" Lab Results  Component Value Date   HGBA1C 5.8 (A) 04/09/2020      Assessment & Plan:   Problem List Items Addressed This Visit       Cardiovascular and Mediastinum   Essential hypertension, benign    Continue olmesartan 40 mg once daily Continue spironolactone 25 mg once daily  Will closely watch potassium due to hyperkalemic risk drugs        Relevant Orders   Comprehensive metabolic panel   TSH   Microalbumin / creatinine urine ratio     Respiratory   Non-seasonal allergic rhinitis - Primary    With symptoms and physical exam suspect allergic rhinitis that has gone untreated that is contributing to her chronic cough Advised patient to start Flonase 50 mcg once daily Upon follow-up we will see if this has improved her cough      Relevant Medications   fluticasone (FLONASE) 50 MCG/ACT nasal spray     Digestive   Gastroesophageal reflux disease     Continue Dexilant 30 mg once daily, trial start Pepcid 10 mg twice daily as well. Try to decrease and or avoid spicy foods, fried fatty foods, and also caffeine and chocolate as these can increase heartburn symptoms.        Relevant Medications   famotidine (PEPCID) 10 MG tablet     Endocrine   Hypothyroidism    Order tsh pending results       Relevant Medications   levothyroxine (SYNTHROID) 75 MCG tablet   Other Relevant Orders   TSH     Nervous and Auditory   Chronic radicular pain of lower back     Stable right now however advised patient to work on stretching exercises and heat as necessary.  Did speak with patient more in detail about starting duloxetine as this may be helpful in her chronic sciatica.  Advised patient recommendation would be to try to cut back the daily muscle relaxer to as needed only.  Advised her that muscle relaxers and her age may cause increased risk for falls and increased sedation.      Chronic bilateral low back pain with right-sided sciatica  Other   Vitamin B12 deficiency   Relevant Orders   CBC   Vitamin B12   Chronic cough    Suspect this is due to a mixture of GERD symptoms as well as allergic rhinitis.       Relevant Medications   famotidine (PEPCID) 10 MG tablet    Meds ordered this encounter  Medications   clotrimazole-betamethasone (LOTRISONE) cream    Sig: APPLY TOPICALLY TO THE AFFECTED AREA TWICE DAILY    Dispense:  30 g    Refill:  2   levothyroxine (SYNTHROID) 75 MCG tablet    Sig: Take 1 tablet (75 mcg total) by mouth daily before breakfast.    Dispense:  90 tablet    Refill:  1   fluticasone (FLONASE) 50 MCG/ACT nasal spray    Sig: Place 2 sprays into both nostrils daily.    Dispense:  16 g    Refill:  6    Order Specific Question:   Supervising Provider    Answer:   BEDSOLE, AMY E [[id number]]   famotidine (PEPCID) 10 MG tablet    Sig: Take 1 tablet (10 mg total) by mouth 2 (two) times daily.    Dispense:  90 tablet    Refill:  1    Order Specific Question:   Supervising Provider    Answer:   Diona Browner, AMY E [[id number]]    Follow-up: Return in about 3 months (around 12/29/2022) for f/u cough .    Eugenia Pancoast, FNP

## 2022-09-29 NOTE — Assessment & Plan Note (Addendum)
Order tsh pending results

## 2022-09-29 NOTE — Assessment & Plan Note (Signed)
With symptoms and physical exam suspect allergic rhinitis that has gone untreated that is contributing to her chronic cough Advised patient to start Flonase 50 mcg once daily Upon follow-up we will see if this has improved her cough

## 2022-09-29 NOTE — Patient Instructions (Addendum)
  Recommend that you take Cymbalta and start this to see if this is helpful for your sciatica.  Methocarbamol really only as needed when the sciatica gets worse.   Continue dexilant 30 mg once daily,  Add on pepcid 10 mg twice daily.   Start daily flonase 50 mcg once daily each nares.

## 2022-09-29 NOTE — Assessment & Plan Note (Signed)
  Continue Dexilant 30 mg once daily, trial start Pepcid 10 mg twice daily as well. Try to decrease and or avoid spicy foods, fried fatty foods, and also caffeine and chocolate as these can increase heartburn symptoms.

## 2022-09-29 NOTE — Assessment & Plan Note (Signed)
Stable right now however advised patient to work on stretching exercises and heat as necessary.  Did speak with patient more in detail about starting duloxetine as this may be helpful in her chronic sciatica.  Advised patient recommendation would be to try to cut back the daily muscle relaxer to as needed only.  Advised her that muscle relaxers and her age may cause increased risk for falls and increased sedation.

## 2022-09-30 ENCOUNTER — Encounter: Payer: Self-pay | Admitting: Family

## 2022-10-03 ENCOUNTER — Encounter: Payer: Self-pay | Admitting: Physical Medicine & Rehabilitation

## 2022-10-03 ENCOUNTER — Encounter: Payer: Medicare PPO | Attending: Physical Medicine & Rehabilitation | Admitting: Physical Medicine & Rehabilitation

## 2022-10-03 VITALS — BP 121/73 | HR 57 | Ht 64.5 in | Wt 146.0 lb

## 2022-10-03 DIAGNOSIS — G8929 Other chronic pain: Secondary | ICD-10-CM | POA: Insufficient documentation

## 2022-10-03 DIAGNOSIS — M47816 Spondylosis without myelopathy or radiculopathy, lumbar region: Secondary | ICD-10-CM | POA: Insufficient documentation

## 2022-10-03 DIAGNOSIS — D447 Neoplasm of uncertain behavior of aortic body and other paraganglia: Secondary | ICD-10-CM | POA: Diagnosis not present

## 2022-10-03 DIAGNOSIS — M5441 Lumbago with sciatica, right side: Secondary | ICD-10-CM | POA: Insufficient documentation

## 2022-10-03 MED ORDER — DULOXETINE HCL 30 MG PO CPEP: 30.0000 mg | ORAL_CAPSULE | Freq: Every day | ORAL | 3 refills | Status: DC

## 2022-10-03 MED ORDER — METHOCARBAMOL 750 MG PO TABS: 750.0000 mg | ORAL_TABLET | Freq: Two times a day (BID) | ORAL | 3 refills | Status: AC | PRN

## 2022-10-03 MED ORDER — DULOXETINE HCL 30 MG PO CPEP: 30.0000 mg | ORAL_CAPSULE | Freq: Every day | ORAL | 7 refills | Status: DC

## 2022-10-03 NOTE — Progress Notes (Signed)
Subjective:    Patient ID: Heather Cobb, female    DOB: 03/04/1941, 82 y.o.   MRN: [id number]  HPI HPI 05/12/22 Heather Cobb is a 82 year old female with past medical history of paraganglioma, CKD, prediabetes who is here for back pain that has been worsening for about 3 months.  Patient was previously active and taking walks of about a mile.  She developed pain in her back that shoots down her leg to her lateral ankle on the right.  Pain is worse with walking and standing.  Bending forward or backwards does not change the pain.  She tried gabapentin 100 mg 3 times daily however this did not help her pain and resulted in sedation so she stopped using this.  Extra strength Tylenol provides some benefit, she does not take NSAIDs due to her kidney disease.  No bowel or bladder changes, no new numbness or weakness.  She did try a couple doses of Robaxin and found that this helped her pain.   Visit 07/01/22 Heather Cobb is here for follow-up of her chronic lower back pain.  She reports that her pain is much improved since starting the Robaxin.  The medication is not causing her any significant side effects at this time.  She was initially using this 2 times a day but now is only using it as needed mostly at night when her pain is severe.    She is able to walk better and be more active with the medication. She has not tried the Cymbalta but will consider trying it if the pain worsened..    Interval History Heather Cobb is here for follow-up regarding her chronic lower back pain.  She reports she is doing well overall.  She has tried the duloxetine and this has been helping to control her pain.  She reports the Robaxin also worked well.  When she does not use 1 of these 2 medications her pain is not well-controlled.  She feels like she is sleeping better with duloxetine and this is possibly causing some sedation however she is not sure.  No additional side effects with the medications.   Pain  Inventory Average Pain 10 Pain Right Now 0 My pain is intermittent and aching  In the last 24 hours, has pain interfered with the following? General activity 0 Relation with others 0 Enjoyment of life 10 What TIME of day is your pain at its worst? varies Sleep (in general) Good  Pain is worse with: standing Pain improves with: medication Relief from Meds:  10  Family History  Problem Relation Age of Onset   Asthma Mother    Kidney failure Mother    Hypertension Daughter    Colon cancer Father        dx in his 29s   Stomach cancer Sister        stage 4   Colon cancer Maternal Uncle    Heart attack Maternal Grandmother    Asthma Brother    Cancer Neg Hx    Social History   Socioeconomic History   Marital status: Widowed    Spouse name: Not on file   Number of children: 1   Years of education: high school   Highest education level: Not on file  Occupational History   Occupation: Engineer, manufacturing systems    Comment: retired  Tobacco Use   Smoking status: Never   Smokeless tobacco: Never  Vaping Use   Vaping Use: Never used  Substance and Sexual Activity  Alcohol use: No    Alcohol/week: 0.0 standard drinks of alcohol   Drug use: No   Sexual activity: Not Currently    Birth control/protection: Surgical  Other Topics Concern   Not on file  Social History Narrative   10/15/19   From: the area   Living: with Daugher [name]) and son-in-law   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, 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      Family: lives with daughter, has 1 grandson and 3 great-grandchildren and 1 great-great-grandchild      Enjoys: eat, dance, travel, cooking      Exercise: walk - not as much in cold, will do 1 mile/day   Diet: good, tries to eat health      Safety   Seat belts: Yes    Guns: Yes  and secure   Safe in relationships: Yes    Social Determinants of Health   Financial Resource Strain: Low Risk  (03/01/2022)   Overall Financial Resource Strain (CARDIA)     Difficulty of Paying Living Expenses: Not hard at all  Food Insecurity: No Food Insecurity (03/01/2022)   Hunger Vital Sign    Worried About Running Out of Food in the Last Year: Never true    [place] in the Last Year: Never true  Transportation Needs: No Transportation Needs (03/01/2022)   PRAPARE - Hydrologist (Medical): No    Lack of Transportation (Non-Medical): No  Physical Activity: Insufficiently Active (03/01/2022)   Exercise Vital Sign    Days of Exercise per Week: 2 days    Minutes of Exercise per Session: 30 min  Stress: No Stress Concern Present (03/01/2022)   [place]    Feeling of Stress : Not at all  Social Connections: Moderately Integrated (03/01/2022)   Social Connection and Isolation Panel [NHANES]    Frequency of Communication with Friends and Family: More than three times a week    Frequency of Social Gatherings with Friends and Family: Three times a week    Attends Religious Services: More than 4 times per year    Active Member of Clubs or Organizations: Yes    Attends Archivist Meetings: More than 4 times per year    Marital Status: Widowed   Past Surgical History:  Procedure Laterality Date   ABDOMINAL HYSTERECTOMY     CATARACT EXTRACTION W/ INTRAOCULAR LENS  IMPLANT, BILATERAL     CHOLECYSTECTOMY N/A 04/15/2015   Procedure: LAPAROSCOPIC CHOLECYSTECTOMY;  Surgeon: Aviva Signs, MD;  Location: [facility];  Service: General;  Laterality: N/A;   COLONOSCOPY  2012   [place]   COLONOSCOPY WITH PROPOFOL N/A 02/03/2016   Procedure: COLONOSCOPY WITH PROPOFOL;  Surgeon: Robert Bellow, MD;  Location: [facility];  Service: Endoscopy;  Laterality: N/A;   EYE SURGERY Bilateral    cataract extractions   HEMORRHOID SURGERY N/A 08/31/2018   Procedure: HEMORRHOIDECTOMY;  Surgeon: Robert Bellow, MD;  Location: [facility];  Service: General;  Laterality:  N/A;   IR ANGIO EXTERNAL CAROTID SEL EXT CAROTID UNI L MOD SED  10/26/2017   IR ANGIO INTRA EXTRACRAN SEL COM CAROTID INNOMINATE BILAT MOD SED  10/26/2017   IR ANGIO VERTEBRAL SEL VERTEBRAL UNI R MOD SED  10/26/2017   IR ANGIOGRAM EXTREMITY LEFT  10/26/2017   IR ANGIOGRAM FOLLOW UP STUDY  10/26/2017   IR NEURO EACH ADD'L AFTER BASIC UNI LEFT (MS)  10/26/2017  IR RADIOLOGIST EVAL & MGMT  09/21/2017   IR TRANSCATH/EMBOLIZ  10/26/2017   MASS EXCISION Left 10/27/2017   Procedure: EXCISION CAROTID BODY TUMOR;  Surgeon: Melida Quitter, MD;  Location: [place];  Service: ENT;  Laterality: Left;   RADIOLOGY WITH ANESTHESIA N/A 10/26/2017   Procedure: EMBOLIZATION;  Surgeon: Luanne Bras, MD;  Location: [place];  Service: Radiology;  Laterality: N/A;   SIGMOIDOSCOPY N/A 08/31/2018   Procedure: SIGMOIDOSCOPY-IN  OR;  Surgeon: Robert Bellow, MD;  Location: [facility];  Service: General;  Laterality: N/A;   UPPER GASTROINTESTINAL ENDOSCOPY  2014   VESICOVAGINAL FISTULA CLOSURE W/ TAH     Past Surgical History:  Procedure Laterality Date   ABDOMINAL HYSTERECTOMY     CATARACT EXTRACTION W/ INTRAOCULAR LENS  IMPLANT, BILATERAL     CHOLECYSTECTOMY N/A 04/15/2015   Procedure: LAPAROSCOPIC CHOLECYSTECTOMY;  Surgeon: Aviva Signs, MD;  Location: [facility];  Service: General;  Laterality: N/A;   COLONOSCOPY  2012   [place]   COLONOSCOPY WITH PROPOFOL N/A 02/03/2016   Procedure: COLONOSCOPY WITH PROPOFOL;  Surgeon: Robert Bellow, MD;  Location: [facility];  Service: Endoscopy;  Laterality: N/A;   EYE SURGERY Bilateral    cataract extractions   HEMORRHOID SURGERY N/A 08/31/2018   Procedure: HEMORRHOIDECTOMY;  Surgeon: Robert Bellow, MD;  Location: [facility];  Service: General;  Laterality: N/A;   IR ANGIO EXTERNAL CAROTID SEL EXT CAROTID UNI L MOD SED  10/26/2017   IR ANGIO INTRA EXTRACRAN SEL COM CAROTID INNOMINATE BILAT MOD SED  10/26/2017   IR ANGIO VERTEBRAL SEL VERTEBRAL UNI R MOD SED  10/26/2017   IR  ANGIOGRAM EXTREMITY LEFT  10/26/2017   IR ANGIOGRAM FOLLOW UP STUDY  10/26/2017   IR NEURO EACH ADD'L AFTER BASIC UNI LEFT (MS)  10/26/2017   IR RADIOLOGIST EVAL & MGMT  09/21/2017   IR TRANSCATH/EMBOLIZ  10/26/2017   MASS EXCISION Left 10/27/2017   Procedure: EXCISION CAROTID BODY TUMOR;  Surgeon: Melida Quitter, MD;  Location: [place];  Service: ENT;  Laterality: Left;   RADIOLOGY WITH ANESTHESIA N/A 10/26/2017   Procedure: EMBOLIZATION;  Surgeon: Luanne Bras, MD;  Location: [place];  Service: Radiology;  Laterality: N/A;   SIGMOIDOSCOPY N/A 08/31/2018   Procedure: SIGMOIDOSCOPY-IN  OR;  Surgeon: Robert Bellow, MD;  Location: [facility];  Service: General;  Laterality: N/A;   UPPER GASTROINTESTINAL ENDOSCOPY  2014   VESICOVAGINAL FISTULA CLOSURE W/ TAH     Past Medical History:  Diagnosis Date   Anemia    Carotid body tumor ([place])    left   Family history of colon cancer    Hypertension    Hypothyroid    Pyelonephritis    Rectal bleeding 11/26/2015   Vertigo    Vitamin B 12 deficiency    Ht 5' 4.5" (1.638 m)   Wt 146 lb (66.2 kg)   BMI 24.67 kg/m   Opioid Risk Score:   Fall Risk Score:  `1  Depression screen [facility] 2/9     10/03/2022   11:26 AM 07/01/2022   10:13 AM 05/12/2022   10:51 AM 03/01/2022    1:43 PM 01/14/2021   10:25 AM 10/15/2019    9:34 AM 11/14/2017    8:21 AM  Depression screen PHQ 2/9  Decreased Interest 0 0 3 0 0 0 0  Down, Depressed, Hopeless 0 0 0 0 0 0 0  PHQ - 2 Score 0 0 3 0 0 0 0  Altered sleeping  0      Tired, decreased energy   0      Change in appetite   0      Feeling bad or failure about yourself    0      Trouble concentrating   0      Moving slowly or fidgety/restless   0      Suicidal thoughts   0      PHQ-9 Score   3        Review of Systems  Genitourinary:  Positive for flank pain and frequency.  Musculoskeletal:        Right leg,right hip  & right back pain  All other systems reviewed and are negative.      Objective:   Physical  Exam   Gen: no distress, normal appearing HEENT: oral mucosa pink and moist, NCAT Chest: normal effort, normal rate of breathing Abd: soft, non-distended Ext: no edema Psych: very pleasant, normal affect Skin: intact Neuro: Alert and oriented , follows commands, answers questions, sensation intact light touch in all 4 extremities, Strength 5 out of 5 in all 4 extremities Musculoskeletal:  Slump test negative b/l No significant lumbar paraspinal tenderness noted Facet loading negative No pain with hip internal and external rotation   L spine MRI 02/18/22 Vertebrae: Marrow space lesions affecting T12, L1, L4 and L5, most extensive at L4. I think these most likely represent atypical hemangiomas. However, the possibility of osseous metastatic disease is not completely ruled out.   Conus medullaris and cauda equina: Conus extends to the L1 level. Conus and cauda equina appear normal.   Paraspinal and other soft tissues: Negative   Disc levels:   T11-12 through L1-2: Normal   L2-3: Minimal disc bulge.  Minimal facet hypertrophy.  No stenosis.   L3-4: Mild bulging of the disc. Mild facet hypertrophy. No stenosis.   L4-5: Mild bulging of the disc. Mild facet and ligamentous hypertrophy. No compressive stenosis.   L5-S1: Shallow protrusion of the disc. Mild indentation of the thecal sac. Mild narrowing of the lateral recesses and foramina. Foraminal encroachment is more pronounced on the right, where the right L5 nerve could be affected.   IMPRESSION: L5-S1: Shallow disc protrusion. Mild indentation of the thecal sac with mild stenosis of the subarticular lateral recesses and neural foramina. Foraminal encroachment is more pronounced on the right, where the right L5 nerve could be irritated.   Mild, non-compressive degenerative changes from L2-3 through L4-5 as described above.   Marrow space signal abnormalities at T12, L1, L4 and L5. I think these probably represent  atypical hemangiomas. The possibility of metastatic disease does exist but is not favored. Does the patient have any known malignancy?       Assessment & Plan:  Chronic lower back pain with right radiculopathy likely L5.  Lumbar spondylosis with MRI with L5-S1 disc protrusion with mild indentation of thecal sac and foraminal narrowing. -Gabapentin stopped previously due to sedation -Continue duloxetine 30 mg, can discontinue and try different medication if patient feels like this is causing too much sedation. -Continue Robaxin 750 mg as needed -Discussed option for TENS.  She is interested in a [employer] TENS unit, will place order   History of paraganglioma followed by oncology.  -Oncology referral placed prior visit, oncology note 05/12/22 reports they reviewed  recent MRI and lesions consistent with hemangiomas, no further workup recommended at this time

## 2022-10-13 DIAGNOSIS — M47816 Spondylosis without myelopathy or radiculopathy, lumbar region: Secondary | ICD-10-CM | POA: Diagnosis not present

## 2022-11-11 ENCOUNTER — Ambulatory Visit
Admission: RE | Admit: 2022-11-11 | Discharge: 2022-11-11 | Disposition: A | Discharge status: Home / Self Care | Payer: Medicare PPO | Source: Ambulatory Visit | Attending: Family Medicine

## 2022-11-11 DIAGNOSIS — M810 Age-related osteoporosis without current pathological fracture: Secondary | ICD-10-CM | POA: Diagnosis not present

## 2022-11-11 DIAGNOSIS — M8589 Other specified disorders of bone density and structure, multiple sites: Secondary | ICD-10-CM | POA: Diagnosis not present

## 2022-11-11 DIAGNOSIS — M8580 Other specified disorders of bone density and structure, unspecified site: Secondary | ICD-10-CM

## 2022-11-13 DIAGNOSIS — M47816 Spondylosis without myelopathy or radiculopathy, lumbar region: Secondary | ICD-10-CM | POA: Diagnosis not present

## 2022-11-16 DIAGNOSIS — E039 Hypothyroidism, unspecified: Secondary | ICD-10-CM | POA: Diagnosis not present

## 2022-11-16 DIAGNOSIS — D631 Anemia in chronic kidney disease: Secondary | ICD-10-CM | POA: Diagnosis not present

## 2022-11-16 DIAGNOSIS — E538 Deficiency of other specified B group vitamins: Secondary | ICD-10-CM | POA: Diagnosis not present

## 2022-11-16 DIAGNOSIS — I10 Essential (primary) hypertension: Secondary | ICD-10-CM | POA: Diagnosis not present

## 2022-11-16 DIAGNOSIS — N1832 Chronic kidney disease, stage 3b: Secondary | ICD-10-CM | POA: Diagnosis not present

## 2022-11-16 DIAGNOSIS — D500 Iron deficiency anemia secondary to blood loss (chronic): Secondary | ICD-10-CM | POA: Diagnosis not present

## 2022-11-17 ENCOUNTER — Encounter: Payer: Self-pay | Admitting: Family

## 2022-11-24 ENCOUNTER — Encounter: Payer: Self-pay | Admitting: Family

## 2022-11-24 DIAGNOSIS — R053 Chronic cough: Secondary | ICD-10-CM

## 2022-11-24 DIAGNOSIS — K219 Gastro-esophageal reflux disease without esophagitis: Secondary | ICD-10-CM

## 2022-11-24 MED ORDER — FAMOTIDINE 10 MG PO TABS: 10.0000 mg | ORAL_TABLET | Freq: Two times a day (BID) | ORAL | 1 refills | Status: DC

## 2022-11-24 MED ORDER — AMLODIPINE BESYLATE 5 MG PO TABS: 5.0000 mg | ORAL_TABLET | Freq: Two times a day (BID) | ORAL | 3 refills | Status: DC

## 2022-11-24 NOTE — Telephone Encounter (Signed)
Pended both did not see where you had given the amlodipine in the past. Ok to fill?

## 2022-12-02 DIAGNOSIS — M47816 Spondylosis without myelopathy or radiculopathy, lumbar region: Secondary | ICD-10-CM | POA: Diagnosis not present

## 2022-12-12 DIAGNOSIS — M47816 Spondylosis without myelopathy or radiculopathy, lumbar region: Secondary | ICD-10-CM | POA: Diagnosis not present

## 2022-12-19 ENCOUNTER — Encounter: Payer: Self-pay | Admitting: Family

## 2022-12-19 DIAGNOSIS — R053 Chronic cough: Secondary | ICD-10-CM

## 2022-12-19 DIAGNOSIS — K219 Gastro-esophageal reflux disease without esophagitis: Secondary | ICD-10-CM

## 2022-12-19 MED ORDER — FAMOTIDINE 10 MG PO TABS: 10.0000 mg | ORAL_TABLET | Freq: Two times a day (BID) | ORAL | 1 refills | Status: DC

## 2022-12-21 ENCOUNTER — Telehealth: Payer: Self-pay

## 2022-12-21 NOTE — Patient Outreach (Signed)
  Care Coordination   12/21/2022 Name: Heather Cobb MRN: [id number] DOB: 1941-08-14   Care Coordination Outreach Attempts:  An unsuccessful telephone outreach was attempted today to offer the patient information about available care coordination services as a benefit of their health plan.   Follow Up Plan:  Additional outreach attempts will be made to offer the patient care coordination information and services.   Encounter Outcome:  No Answer   Care Coordination Interventions:  No, not indicated    Quinn Plowman [facility] [place] [phone] direct line

## 2022-12-29 ENCOUNTER — Encounter: Payer: Self-pay | Admitting: Family

## 2022-12-29 ENCOUNTER — Ambulatory Visit (INDEPENDENT_AMBULATORY_CARE_PROVIDER_SITE_OTHER): Payer: Medicare PPO | Admitting: Family

## 2022-12-29 VITALS — BP 122/76 | HR 56 | Temp 97.8°F | Ht 64.5 in | Wt 144.6 lb

## 2022-12-29 DIAGNOSIS — K219 Gastro-esophageal reflux disease without esophagitis: Secondary | ICD-10-CM

## 2022-12-29 DIAGNOSIS — J301 Allergic rhinitis due to pollen: Secondary | ICD-10-CM | POA: Diagnosis not present

## 2022-12-29 DIAGNOSIS — R053 Chronic cough: Secondary | ICD-10-CM

## 2022-12-29 MED ORDER — LORATADINE 10 MG PO TABS: 10.0000 mg | ORAL_TABLET | Freq: Every day | ORAL | 3 refills | Status: AC

## 2022-12-29 MED ORDER — VOQUEZNA 10 MG PO TABS: 10.0000 mg | ORAL_TABLET | Freq: Every day | ORAL | 1 refills | Status: DC

## 2022-12-29 NOTE — Patient Instructions (Addendum)
  Try to elevate your head at night to decrease heartburn symptoms.  I am sending in a new RX for heartburn control. Once you receive this (if approved by your insurance) I want you to stop dexilant and start this new medication called Voquenza. Let's see if this helps with your heartburn control.   Again,  Try to decrease and or avoid spicy foods, fried fatty foods, and also caffeine and chocolate as these can increase heartburn symptoms.    Regards,   Mort Sawyers FNP-C

## 2022-12-29 NOTE — Progress Notes (Signed)
Established Patient Office Visit  Subjective:      CC:  Chief Complaint  Patient presents with   Medical Management of Chronic Issues    Cough still lingering around.    HPI: Heather Cobb is a 82 y.o. female presenting on 12/29/2022 for Medical Management of Chronic Issues (Cough still lingering around.) .  GERD: on dexilant 30 mg once daily and also started trial pepcid 10 mg bid. She states in the beginning was helping, but now she is back to coughing often again. She does also have allergies. No itchy ears no watery eyes no sneezing. Doesn't feel sour taste in her mouth and doesn't feel the heartburn, but she does feel when she eats or drinks is when she starts having a dry cough again. In the night time when lying down will also start coughing. She does notice that when she eats spicy foods or tomato based foods or fried foods it increases in intensity.   She does find if she uses albuterol inhaler this helps stop her cough at nighttime. She does use this daily if not QOD. She does also wake up coughing at night.   Has been on pantoprazole prior but wasn't helping. Tried with famotadine no real improvement. On dexilant without much improved. Also have tried nexium otc without relief.   Allergies: does use daily flonase nose spray. She is not taking anything else for this.        Social history:  Relevant past medical, surgical, family and social history reviewed and updated as indicated. Interim medical history since our last visit reviewed.  Allergies and medications reviewed and updated.  DATA REVIEWED: CHART IN EPIC     ROS: Negative unless specifically indicated above in HPI.    Current Outpatient Medications:    albuterol (VENTOLIN HFA) 108 (90 Base) MCG/ACT inhaler, Inhale 2 puffs into the lungs every 6 (six) hours as needed for wheezing or shortness of breath., Disp: 18 g, Rfl: 11   amLODipine (NORVASC) 5 MG tablet, Take 1 tablet (5 mg total) by mouth 2  (two) times daily., Disp: 180 tablet, Rfl: 3   aspirin EC 81 MG tablet, Take 81 mg by mouth daily., Disp: , Rfl:    benzonatate (TESSALON PERLES) 100 MG capsule, 1-2 capsules up to twice daily for cough, Disp: 20 capsule, Rfl: 0   cholecalciferol (VITAMIN D) 1000 UNITS tablet, Take 1,000 Units by mouth daily., Disp: , Rfl:    clotrimazole-betamethasone (LOTRISONE) cream, APPLY TOPICALLY TO THE AFFECTED AREA TWICE DAILY, Disp: 30 g, Rfl: 2   DULoxetine (CYMBALTA) 30 MG capsule, Take 1 capsule (30 mg total) by mouth daily., Disp: 90 capsule, Rfl: 3   famotidine (PEPCID) 10 MG tablet, Take 1 tablet (10 mg total) by mouth 2 (two) times daily., Disp: 90 tablet, Rfl: 1   ferrous sulfate 325 (65 FE) MG tablet, Take 325 mg by mouth daily with breakfast., Disp: , Rfl:    fluticasone (FLONASE) 50 MCG/ACT nasal spray, Place 2 sprays into both nostrils daily., Disp: 16 g, Rfl: 6   levothyroxine (SYNTHROID) 75 MCG tablet, Take 1 tablet (75 mcg total) by mouth daily before breakfast., Disp: 90 tablet, Rfl: 1   linaclotide (LINZESS) 145 MCG CAPS capsule, Take 1 capsule (145 mcg total) by mouth daily as needed (for constipation)., Disp: 90 capsule, Rfl: 3   loratadine (CLARITIN) 10 MG tablet, Take 1 tablet (10 mg total) by mouth daily., Disp: 90 tablet, Rfl: 3   methocarbamol (ROBAXIN-750) 750  MG tablet, Take 1 tablet (750 mg total) by mouth every 12 (twelve) hours as needed for muscle spasms., Disp: 60 tablet, Rfl: 3   olmesartan (BENICAR) 40 MG tablet, TAKE 1 TABLET(40 MG) BY MOUTH DAILY, Disp: 90 tablet, Rfl: 3   spironolactone (ALDACTONE) 25 MG tablet, Take 1 tablet (25 mg total) by mouth daily. For blood pressure., Disp: 90 tablet, Rfl: 1   vitamin B-12 (CYANOCOBALAMIN) 1000 MCG tablet, Take 1,000 mcg by mouth daily. , Disp: , Rfl:    Vonoprazan Fumarate (VOQUEZNA) 10 MG TABS, Take 10 mg by mouth daily., Disp: 90 tablet, Rfl: 1      Objective:    BP 122/76 (BP Location: Left Arm)   Pulse (!) 56   Temp  97.8 F (36.6 C) (Temporal)   Ht 5' 4.5" (1.638 m)   Wt 144 lb 9.6 oz (65.6 kg)   SpO2 98%   BMI 24.44 kg/m   Wt Readings from Last 3 Encounters:  12/29/22 144 lb 9.6 oz (65.6 kg)  10/03/22 146 lb (66.2 kg)  09/29/22 147 lb (66.7 kg)    Physical Exam Constitutional:      General: She is not in acute distress.    Appearance: Normal appearance. She is normal weight. She is not ill-appearing, toxic-appearing or diaphoretic.  HENT:     Head: Normocephalic.  Cardiovascular:     Rate and Rhythm: Normal rate and regular rhythm.  Pulmonary:     Effort: Pulmonary effort is normal.  Musculoskeletal:        General: Normal range of motion.     Right lower leg: No edema.     Left lower leg: No edema.  Neurological:     General: No focal deficit present.     Mental Status: She is alert and oriented to person, place, and time. Mental status is at baseline.  Psychiatric:        Mood and Affect: Mood normal.        Behavior: Behavior normal.        Thought Content: Thought content normal.        Judgment: Judgment normal.           Assessment & Plan:  Seasonal allergic rhinitis due to pollen -     Loratadine; Take 1 tablet (10 mg total) by mouth daily.  Dispense: 90 tablet; Refill: 3  Chronic cough Assessment & Plan: GERD vs RAD vs allergies  Overusing inhaler as she is using this nightly, will set up pt for spirometry to eval RAD asthma copd, may consider daily inhaler based off of these findings Continue flonase daily, start claritin 10 mg nightly.      Gastroesophageal reflux disease, unspecified whether esophagitis present Assessment & Plan: Will attempt to change from dexilant to voquezna Did again advise pt to avoid spicy foods, fried fatty foods, and also caffeine and chocolate as these can increase heartburn symptoms.  If ongoing uncontrolled GERD will consider referral to GI for further eval.  Last EGD 2014 Continue famotidine twice daily   Orders: -      Voquezna; Take 10 mg by mouth daily.  Dispense: 90 tablet; Refill: 1     Return in about 3 months (around 03/30/2023) for f/u GERD.  Mort Sawyers, MSN, APRN, FNP-C [place] [facility]

## 2022-12-29 NOTE — Assessment & Plan Note (Signed)
Will attempt to change from dexilant to voquezna Did again advise pt to avoid spicy foods, fried fatty foods, and also caffeine and chocolate as these can increase heartburn symptoms.  If ongoing uncontrolled GERD will consider referral to GI for further eval.  Last EGD 2014 Continue famotidine twice daily

## 2022-12-29 NOTE — Assessment & Plan Note (Addendum)
GERD vs RAD vs allergies  Overusing inhaler as she is using this nightly, will set up pt for spirometry to eval RAD asthma copd, may consider daily inhaler based off of these findings Continue flonase daily, start claritin 10 mg nightly.

## 2023-01-02 DIAGNOSIS — M47816 Spondylosis without myelopathy or radiculopathy, lumbar region: Secondary | ICD-10-CM | POA: Diagnosis not present

## 2023-01-04 ENCOUNTER — Encounter: Payer: Self-pay | Admitting: Family

## 2023-01-04 ENCOUNTER — Encounter: Payer: Self-pay | Admitting: Genetic Counselor

## 2023-01-04 NOTE — Progress Notes (Signed)
UPDATE: STK11 c.[id number] VUS has been reclassified to Likely Benign.  The amended report is dated December 23, 2022.

## 2023-01-10 ENCOUNTER — Encounter: Payer: Self-pay | Admitting: Family

## 2023-01-10 DIAGNOSIS — K219 Gastro-esophageal reflux disease without esophagitis: Secondary | ICD-10-CM

## 2023-01-10 DIAGNOSIS — I10 Essential (primary) hypertension: Secondary | ICD-10-CM

## 2023-01-12 DIAGNOSIS — M47816 Spondylosis without myelopathy or radiculopathy, lumbar region: Secondary | ICD-10-CM | POA: Diagnosis not present

## 2023-01-16 ENCOUNTER — Ambulatory Visit (INDEPENDENT_AMBULATORY_CARE_PROVIDER_SITE_OTHER): Payer: Medicare PPO | Admitting: Family Medicine

## 2023-01-16 ENCOUNTER — Encounter: Payer: Self-pay | Admitting: Family Medicine

## 2023-01-16 VITALS — BP 112/62 | HR 73 | Temp 97.9°F | Ht 64.5 in | Wt 141.4 lb

## 2023-01-16 DIAGNOSIS — N3000 Acute cystitis without hematuria: Secondary | ICD-10-CM | POA: Insufficient documentation

## 2023-01-16 DIAGNOSIS — R350 Frequency of micturition: Secondary | ICD-10-CM | POA: Diagnosis not present

## 2023-01-16 DIAGNOSIS — R053 Chronic cough: Secondary | ICD-10-CM | POA: Diagnosis not present

## 2023-01-16 DIAGNOSIS — R059 Cough, unspecified: Secondary | ICD-10-CM | POA: Diagnosis not present

## 2023-01-16 LAB — POC URINALSYSI DIPSTICK (AUTOMATED)
Bilirubin, UA: NEGATIVE
Blood, UA: 80
Glucose, UA: NEGATIVE
Ketones, UA: NEGATIVE
Nitrite, UA: NEGATIVE
Protein, UA: NEGATIVE
Spec Grav, UA: 1.015 (ref 1.010–1.025)
Urobilinogen, UA: 0.2 E.U./dL
pH, UA: 6 (ref 5.0–8.0)

## 2023-01-16 LAB — POC COVID19 BINAXNOW: SARS Coronavirus 2 Ag: NEGATIVE

## 2023-01-16 MED ORDER — BENZONATATE 200 MG PO CAPS: 200.0000 mg | ORAL_CAPSULE | Freq: Three times a day (TID) | ORAL | 0 refills | Status: DC | PRN

## 2023-01-16 MED ORDER — CEPHALEXIN 500 MG PO CAPS: 500.0000 mg | ORAL_CAPSULE | Freq: Two times a day (BID) | ORAL | 0 refills | Status: DC

## 2023-01-16 NOTE — Patient Instructions (Addendum)
Try tessalon for cough  I will check in with Tabitha  If symptoms worsen let us know     Take keflex for the uti  We will alert you with urine culture result  Drink water  If your symptoms suddenly worsen before the culture returns let us know

## 2023-01-16 NOTE — Assessment & Plan Note (Signed)
Acute on chronic  Rev last note, labs, cxr and spirometry  Complex history  Reassuring exam  Disc poss of allergies adding to this Per pt more aggressive GERD tx did not help Refilled tessalon pearles  Will cc NP Dugal to decide further f/u Disc ER precautions Neg covid test today

## 2023-01-16 NOTE — Progress Notes (Signed)
Subjective:    Patient ID: Heather Cobb, female    DOB: [birth year], 82 y.o.   MRN: [id number]  HPI 82 yo pt of NP Dugal presents with cough and also urinary symptoms   Wt Readings from Last 3 Encounters:  01/16/23 141 lb 6 oz (64.1 kg)  12/29/22 144 lb 9.6 oz (65.6 kg)  10/03/22 146 lb (66.2 kg)   23.89 kg/m  Vitals:   01/16/23 1202  BP: 112/62  Pulse: 73  Temp: 97.9 F (36.6 C)  SpO2: 97%   Chronic cough is noted in her problem list  His has gotten worse   GERD vs RAD vs allergies  Overusing inhaler as she is using this nightly, will set up pt for spirometry to eval RAD asthma copd, may consider daily inhaler based off of these findings Continue flonase daily, start claritin 10 mg nightly.    Flonase Pepcid  Albuterol   GERD Changed dexilant to voquenza   Chest xray was no 06/2022   Had spirometry in 2021  FEV1/FVC was 82%    Covid test is negative today  Results for orders placed or performed in visit on 01/16/23  POCT Urinalysis Dipstick (Automated)  Result Value Ref Range   Color, UA Dark Yellow    Clarity, UA Cloudy    Glucose, UA Negative Negative   Bilirubin, UA Negative    Ketones, UA Negative    Spec Grav, UA 1.015 1.010 - 1.025   Blood, UA 80 Ery/uL    pH, UA 6.0 5.0 - 8.0   Protein, UA Negative Negative   Urobilinogen, UA 0.2 0.2 or 1.0 E.U./dL   Nitrite, UA Negative    Leukocytes, UA Large (3+) (A) Negative  POC COVID-19  Result Value Ref Range   SARS Coronavirus 2 Ag Negative Negative    Cough- is worse  Especially at night  (family notes day also)  Makes it hard to catch breath during cough spells   Dry cough most of the time  Scant phlegm is clear  Voice is hoarse   Urinary frequency and odor    Ua -mod blood and leukocytes  No burning to urinate  No blood  Does not get utis often    She has h/o CKD Last GFR   Patient Active Problem List   Diagnosis Date Noted   Acute cystitis 01/16/2023   Chronic bilateral low  back pain with right-sided sciatica 09/29/2022   Non-seasonal allergic rhinitis 09/29/2022   Chronic cough 06/21/2022   History of carotid body tumor 06/21/2022   Gastroesophageal reflux disease 06/21/2022   Chronic radicular pain of lower back 06/21/2022   Elevated hemidiaphragm 06/21/2022   Dermatitis 01/12/2022   Osteopenia 12/22/2021   Hot flashes 01/14/2021   Prediabetes 04/09/2020   Polycystic liver disease 10/24/2019   Coronary atherosclerosis due to calcified coronary lesion of native artery 10/24/2019   Chronic kidney disease, stage 3a (HCC) 10/15/2019   Genetic testing 02/06/2018   Postoperative hematoma of subcutaneous tissue following non-dermatologic procedure 11/03/2017   Constipation 11/26/2015   Vitamin B12 deficiency 02/03/2013   Essential hypertension, benign 02/02/2013   Anemia in chronic kidney disease 02/02/2013   Hypothyroidism 02/02/2013   Past Medical History:  Diagnosis Date   Anemia    Carotid body tumor (HCC)    left   Family history of colon cancer    Hypertension    Hypothyroid    Pyelonephritis    Rectal bleeding 11/26/2015   Vertigo    Vitamin  B 12 deficiency    Past Surgical History:  Procedure Laterality Date   ABDOMINAL HYSTERECTOMY     CATARACT EXTRACTION W/ INTRAOCULAR LENS  IMPLANT, BILATERAL     CHOLECYSTECTOMY N/A 04/15/2015   Procedure: LAPAROSCOPIC CHOLECYSTECTOMY;  Surgeon: Franky Macho, MD;  Location: [facility];  Service: General;  Laterality: N/A;   COLONOSCOPY  2012   [place]   COLONOSCOPY WITH PROPOFOL N/A 02/03/2016   Procedure: COLONOSCOPY WITH PROPOFOL;  Surgeon: Earline Mayotte, MD;  Location: [facility];  Service: Endoscopy;  Laterality: N/A;   EYE SURGERY Bilateral    cataract extractions   HEMORRHOID SURGERY N/A 08/31/2018   Procedure: HEMORRHOIDECTOMY;  Surgeon: Earline Mayotte, MD;  Location: [facility];  Service: General;  Laterality: N/A;   IR ANGIO EXTERNAL CAROTID SEL EXT CAROTID UNI L MOD SED  10/26/2017    IR ANGIO INTRA EXTRACRAN SEL COM CAROTID INNOMINATE BILAT MOD SED  10/26/2017   IR ANGIO VERTEBRAL SEL VERTEBRAL UNI R MOD SED  10/26/2017   IR ANGIOGRAM EXTREMITY LEFT  10/26/2017   IR ANGIOGRAM FOLLOW UP STUDY  10/26/2017   IR NEURO EACH ADD'L AFTER BASIC UNI LEFT (MS)  10/26/2017   IR RADIOLOGIST EVAL & MGMT  09/21/2017   IR TRANSCATH/EMBOLIZ  10/26/2017   MASS EXCISION Left 10/27/2017   Procedure: EXCISION CAROTID BODY TUMOR;  Surgeon: Christia Reading, MD;  Location: [facility] OR;  Service: ENT;  Laterality: Left;   RADIOLOGY WITH ANESTHESIA N/A 10/26/2017   Procedure: EMBOLIZATION;  Surgeon: Julieanne Cotton, MD;  Location: [facility];  Service: Radiology;  Laterality: N/A;   SIGMOIDOSCOPY N/A 08/31/2018   Procedure: SIGMOIDOSCOPY-IN  OR;  Surgeon: Earline Mayotte, MD;  Location: [facility];  Service: General;  Laterality: N/A;   UPPER GASTROINTESTINAL ENDOSCOPY  2014   VESICOVAGINAL FISTULA CLOSURE W/ TAH     Social History   Tobacco Use   Smoking status: Never   Smokeless tobacco: Never  Vaping Use   Vaping Use: Never used  Substance Use Topics   Alcohol use: No    Alcohol/week: 0.0 standard drinks of alcohol   Drug use: No   Family History  Problem Relation Age of Onset   Asthma Mother    Kidney failure Mother    Hypertension Daughter    Colon cancer Father        dx in his 39s   Stomach cancer Sister        stage 4   Colon cancer Maternal Uncle    Heart attack Maternal Grandmother    Asthma Brother    Cancer Neg Hx    Allergies  Allergen Reactions   Codeine Nausea Only and Other (See Comments)    fever fever fever   Current Outpatient Medications on File Prior to Visit  Medication Sig Dispense Refill   albuterol (VENTOLIN HFA) 108 (90 Base) MCG/ACT inhaler Inhale 2 puffs into the lungs every 6 (six) hours as needed for wheezing or shortness of breath. 18 g 11   amLODipine (NORVASC) 5 MG tablet Take 1 tablet (5 mg total) by mouth 2 (two) times daily. 180 tablet 3   aspirin EC 81 MG  tablet Take 81 mg by mouth daily.     cholecalciferol (VITAMIN D) 1000 UNITS tablet Take 1,000 Units by mouth daily.     clotrimazole-betamethasone (LOTRISONE) cream APPLY TOPICALLY TO THE AFFECTED AREA TWICE DAILY 30 g 2   DULoxetine (CYMBALTA) 30 MG capsule Take 1 capsule (30 mg total) by mouth daily. 90  capsule 3   famotidine (PEPCID) 10 MG tablet Take 1 tablet (10 mg total) by mouth 2 (two) times daily. 90 tablet 1   ferrous sulfate 325 (65 FE) MG tablet Take 325 mg by mouth daily with breakfast.     fluticasone (FLONASE) 50 MCG/ACT nasal spray Place 2 sprays into both nostrils daily. 16 g 6   levothyroxine (SYNTHROID) 75 MCG tablet Take 1 tablet (75 mcg total) by mouth daily before breakfast. 90 tablet 1   linaclotide (LINZESS) 145 MCG CAPS capsule Take 1 capsule (145 mcg total) by mouth daily as needed (for constipation). 90 capsule 3   loratadine (CLARITIN) 10 MG tablet Take 1 tablet (10 mg total) by mouth daily. 90 tablet 3   methocarbamol (ROBAXIN-750) 750 MG tablet Take 1 tablet (750 mg total) by mouth every 12 (twelve) hours as needed for muscle spasms. 60 tablet 3   olmesartan (BENICAR) 40 MG tablet TAKE 1 TABLET(40 MG) BY MOUTH DAILY 90 tablet 3   spironolactone (ALDACTONE) 25 MG tablet Take 1 tablet (25 mg total) by mouth daily. For blood pressure. 90 tablet 1   vitamin B-12 (CYANOCOBALAMIN) 1000 MCG tablet Take 1,000 mcg by mouth daily.      Vonoprazan Fumarate (VOQUEZNA) 10 MG TABS Take 10 mg by mouth daily. 90 tablet 1   No current facility-administered medications on file prior to visit.    Review of Systems  Constitutional:  Negative for activity change, appetite change, fatigue, fever and unexpected weight change.  HENT:  Positive for sore throat. Negative for congestion, ear pain, rhinorrhea and sinus pressure.        Throat is a little sore from coughing   Eyes:  Negative for pain, redness and visual disturbance.  Respiratory:  Positive for cough. Negative for choking,  chest tightness, shortness of breath, wheezing and stridor.   Cardiovascular:  Negative for chest pain and palpitations.  Gastrointestinal:  Negative for abdominal pain, blood in stool, constipation and diarrhea.       No heartburn  Endocrine: Negative for polydipsia and polyuria.  Genitourinary:  Positive for frequency and urgency. Negative for decreased urine volume, dysuria, hematuria and pelvic pain.  Musculoskeletal:  Positive for back pain. Negative for arthralgias and myalgias.  Skin:  Negative for pallor and rash.  Allergic/Immunologic: Negative for environmental allergies.  Neurological:  Negative for dizziness, syncope and headaches.  Hematological:  Negative for adenopathy. Does not bruise/bleed easily.  Psychiatric/Behavioral:  Negative for decreased concentration and dysphoric mood. The patient is not nervous/anxious.        Objective:   Physical Exam Constitutional:      General: She is not in acute distress.    Appearance: Normal appearance. She is well-developed and normal weight. She is not ill-appearing or diaphoretic.  HENT:     Head: Normocephalic and atraumatic.     Right Ear: Tympanic membrane and ear canal normal.     Left Ear: Tympanic membrane and ear canal normal.     Nose:     Comments: Boggy nares  Eyes:     General:        Right eye: No discharge.        Left eye: No discharge.     Conjunctiva/sclera: Conjunctivae normal.     Pupils: Pupils are equal, round, and reactive to light.  Neck:     Thyroid: No thyromegaly.     Vascular: No carotid bruit or JVD.  Cardiovascular:     Rate and Rhythm: Normal rate  and regular rhythm.     Heart sounds: Normal heart sounds.     No gallop.  Pulmonary:     Effort: Pulmonary effort is normal. No respiratory distress.     Breath sounds: Normal breath sounds. No stridor. No wheezing, rhonchi or rales.     Comments: Good air exch  Chest:     Chest wall: No tenderness.  Abdominal:     General: There is no  distension or abdominal bruit.     Palpations: Abdomen is soft. There is no mass.     Tenderness: There is no abdominal tenderness. There is no right CVA tenderness, left CVA tenderness, guarding or rebound.     Comments: No suprapubic tenderness or fullness    Musculoskeletal:     Cervical back: Normal range of motion and neck supple.     Right lower leg: No edema.     Left lower leg: No edema.     Comments: No LS tenderness  Lymphadenopathy:     Cervical: No cervical adenopathy.  Skin:    General: Skin is warm and dry.     Coloration: Skin is not pale.     Findings: No rash.  Neurological:     Mental Status: She is alert.     Coordination: Coordination normal.     Deep Tendon Reflexes: Reflexes are normal and symmetric. Reflexes normal.  Psychiatric:        Mood and Affect: Mood normal.           Assessment & Plan:   Problem List Items Addressed This Visit       Genitourinary   Acute cystitis - Primary    Positive UA with leuk and blood (micro) Freq and odor of urine  Reassuring exam Px keflex  Pending urine cx -will alert with result  Encouraged good water intake  Handout given      Relevant Orders   Urine Culture     Other   Chronic cough    Acute on chronic  Rev last note, labs, cxr and spirometry  Complex history  Reassuring exam  Disc poss of allergies adding to this Per pt more aggressive GERD tx did not help Refilled tessalon pearles  Will cc NP Dugal to decide further f/u Disc ER precautions Neg covid test today       Other Visit Diagnoses     Urinary frequency       Relevant Orders   POCT Urinalysis Dipstick (Automated) (Completed)   Cough, unspecified type       Relevant Orders   POC COVID-19 (Completed)

## 2023-01-16 NOTE — Assessment & Plan Note (Signed)
Positive UA with leuk and blood (micro) Freq and odor of urine  Reassuring exam Px keflex  Pending urine cx -will alert with result  Encouraged good water intake  Handout given

## 2023-01-17 LAB — URINE CULTURE
MICRO NUMBER:: 14886748
SPECIMEN QUALITY:: ADEQUATE

## 2023-01-27 MED ORDER — SPIRONOLACTONE 25 MG PO TABS
25.0000 mg | ORAL_TABLET | Freq: Every day | ORAL | 3 refills | Status: DC
Start: 2023-01-27 — End: 2023-03-07

## 2023-01-31 MED ORDER — DEXLANSOPRAZOLE 60 MG PO CPDR
60.0000 mg | DELAYED_RELEASE_CAPSULE | Freq: Every day | ORAL | 0 refills | Status: DC
Start: 2023-01-31 — End: 2023-02-06

## 2023-01-31 NOTE — Addendum Note (Signed)
Addended by: Mort Sawyers on: 01/31/2023 10:15 AM   Modules accepted: Orders

## 2023-02-01 DIAGNOSIS — M47816 Spondylosis without myelopathy or radiculopathy, lumbar region: Secondary | ICD-10-CM | POA: Diagnosis not present

## 2023-02-06 MED ORDER — DEXLANSOPRAZOLE 60 MG PO CPDR: 60.0000 mg | DELAYED_RELEASE_CAPSULE | Freq: Every day | ORAL | 0 refills | Status: DC

## 2023-02-06 NOTE — Addendum Note (Signed)
Addended by: Mort Sawyers on: 02/06/2023 02:32 PM   Modules accepted: Orders

## 2023-02-09 NOTE — Progress Notes (Signed)
Celso Amy, PA-C [address]  [address]  [place], [place] [address]  Main: [phone]  Fax: [phone]   Gastroenterology Consultation  Referring Provider:     Mort Sawyers, FNP Primary Care Physician:  Mort Sawyers, FNP Primary Gastroenterologist:  Celso Amy, PA-C / Dr. Wyline Mood  Reason for Consultation:     GERD, Chronic Cough        HPI:   Heather Cobb is a 82 y.o. y/o female referred for consultation & management  by Mort Sawyers, FNP.    Has Chronic Cough attributed to GERD vs Allergies vs Reactive Airway Disease.  Pantoprazole, Famotidine, and OTC Nexium did not help.  Most recently tried Dexilant 30mg  QD and pepcid 10mg  BID.  Helped initially.  Sx worse after eating spicey or fried food or tomatoes.  Dry and sometimes productive cough.  Recently switched PPI to Voquezna 10mg  QD on 12/29/22 (6 wks ago) -will waiting on prescription approval through the [place].  She does not feel any heartburn or chest pain.  She denies abdominal pain or dysphagia.  She has had chronic cough for over 9 years.  Is getting worse.  Increasing cough at nighttime or after eating.  Inhaler helps.  She has a scratchy throat.  She tries to avoid trigger foods such as fried foods, tomatoes, and spicy foods.  Has allergies.  Uses albuterol inhaler and Flonase.  Recently started on Loratidine (Claritin).  She has not seen a pulmonologist.  She admits to occasional shortness of breath when she has a lot of coughing.  06/2022 - CXR - Normal; NAD 03/2022 - Neck CT - (Hx paraganglioma s/p excision 2019) - NO recurrence.  Saw GI Dr. Bosie Clos in [place] in 2014 for IDA. 01/2016 - Colonoscopy - Dr. Lemar Livings - Sig TICS, Int Hemorrh, NO polyps, Good prep. 04/2013 - EGD - Dr. Bosie Clos - Hiatal Hernia, Mod Gastritis, OTW Normal. 11/2010 - Colonoscopy - Dr. Bosie Clos - No Polyps. 07/2005 - Colonoscopy - Dr. Bosie Clos - 3 small rectal polyps.  Past Medical History:  Diagnosis Date   Anemia    Carotid  body tumor (HCC)    left   Family history of colon cancer    Hypertension    Hypothyroid    Pyelonephritis    Rectal bleeding 11/26/2015   Vertigo    Vitamin B 12 deficiency     Past Surgical History:  Procedure Laterality Date   ABDOMINAL HYSTERECTOMY     CATARACT EXTRACTION W/ INTRAOCULAR LENS  IMPLANT, BILATERAL     CHOLECYSTECTOMY N/A 04/15/2015   Procedure: LAPAROSCOPIC CHOLECYSTECTOMY;  Surgeon: Franky Macho, MD;  Location: [facility];  Service: General;  Laterality: N/A;   COLONOSCOPY  2012   [place]   COLONOSCOPY WITH PROPOFOL N/A 02/03/2016   Procedure: COLONOSCOPY WITH PROPOFOL;  Surgeon: Earline Mayotte, MD;  Location: [facility];  Service: Endoscopy;  Laterality: N/A;   EYE SURGERY Bilateral    cataract extractions   HEMORRHOID SURGERY N/A 08/31/2018   Procedure: HEMORRHOIDECTOMY;  Surgeon: Earline Mayotte, MD;  Location: [facility];  Service: General;  Laterality: N/A;   IR ANGIO EXTERNAL CAROTID SEL EXT CAROTID UNI L MOD SED  10/26/2017   IR ANGIO INTRA EXTRACRAN SEL COM CAROTID INNOMINATE BILAT MOD SED  10/26/2017   IR ANGIO VERTEBRAL SEL VERTEBRAL UNI R MOD SED  10/26/2017   IR ANGIOGRAM EXTREMITY LEFT  10/26/2017   IR ANGIOGRAM FOLLOW UP STUDY  10/26/2017   IR NEURO EACH ADD'L AFTER BASIC  UNI LEFT (MS)  10/26/2017   IR RADIOLOGIST EVAL & MGMT  09/21/2017   IR TRANSCATH/EMBOLIZ  10/26/2017   MASS EXCISION Left 10/27/2017   Procedure: EXCISION CAROTID BODY TUMOR;  Surgeon: Christia Reading, MD;  Location: [facility] OR;  Service: ENT;  Laterality: Left;   RADIOLOGY WITH ANESTHESIA N/A 10/26/2017   Procedure: EMBOLIZATION;  Surgeon: Julieanne Cotton, MD;  Location: [facility] OR;  Service: Radiology;  Laterality: N/A;   SIGMOIDOSCOPY N/A 08/31/2018   Procedure: SIGMOIDOSCOPY-IN  OR;  Surgeon: Earline Mayotte, MD;  Location: [facility];  Service: General;  Laterality: N/A;   UPPER GASTROINTESTINAL ENDOSCOPY  2014   VESICOVAGINAL FISTULA CLOSURE W/ TAH      Prior to Admission medications    Medication Sig Start Date End Date Taking? Authorizing Provider  albuterol (VENTOLIN HFA) 108 (90 Base) MCG/ACT inhaler Inhale 2 puffs into the lungs every 6 (six) hours as needed for wheezing or shortness of breath. 06/21/22   Mort Sawyers, FNP  amLODipine (NORVASC) 5 MG tablet Take 1 tablet (5 mg total) by mouth 2 (two) times daily. 11/24/22 11/19/23  Mort Sawyers, FNP  aspirin EC 81 MG tablet Take 81 mg by mouth daily.    [provider]  benzonatate (TESSALON) 200 MG capsule Take 1 capsule (200 mg total) by mouth 3 (three) times daily as needed for cough. 01/16/23   Tower, Audrie Gallus, MD  cephALEXin (KEFLEX) 500 MG capsule Take 1 capsule (500 mg total) by mouth 2 (two) times daily. 01/16/23   Tower, Audrie Gallus, MD  cholecalciferol (VITAMIN D) 1000 UNITS tablet Take 1,000 Units by mouth daily.    [provider]  clotrimazole-betamethasone (LOTRISONE) cream APPLY TOPICALLY TO THE AFFECTED AREA TWICE DAILY 09/29/22   Mort Sawyers, FNP  dexlansoprazole (DEXILANT) 60 MG capsule Take 1 capsule (60 mg total) by mouth daily. 02/06/23   Mort Sawyers, FNP  DULoxetine (CYMBALTA) 30 MG capsule Take 1 capsule (30 mg total) by mouth daily. 10/03/22 10/03/23  Fanny Dance, MD  famotidine (PEPCID) 10 MG tablet Take 1 tablet (10 mg total) by mouth 2 (two) times daily. 12/19/22   Mort Sawyers, FNP  ferrous sulfate 325 (65 FE) MG tablet Take 325 mg by mouth daily with breakfast.    [provider]  fluticasone (FLONASE) 50 MCG/ACT nasal spray Place 2 sprays into both nostrils daily. 09/29/22   Mort Sawyers, FNP  levothyroxine (SYNTHROID) 75 MCG tablet Take 1 tablet (75 mcg total) by mouth daily before breakfast. 09/29/22   Mort Sawyers, FNP  linaclotide (LINZESS) 145 MCG CAPS capsule Take 1 capsule (145 mcg total) by mouth daily as needed (for constipation). 02/09/22   Gweneth Dimitri, MD  loratadine (CLARITIN) 10 MG tablet Take 1 tablet (10 mg total) by mouth daily. 12/29/22   Mort Sawyers, FNP  methocarbamol (ROBAXIN-750) 750 MG tablet Take 1 tablet (750 mg total) by mouth every 12 (twelve) hours as needed for muscle spasms. 10/03/22   Fanny Dance, MD  olmesartan (BENICAR) 40 MG tablet TAKE 1 TABLET(40 MG) BY MOUTH DAILY 06/21/22   Mort Sawyers, FNP  spironolactone (ALDACTONE) 25 MG tablet Take 1 tablet (25 mg total) by mouth daily. For blood pressure. 01/27/23   Mort Sawyers, FNP  vitamin B-12 (CYANOCOBALAMIN) 1000 MCG tablet Take 1,000 mcg by mouth daily.     [provider]    Family History  Problem Relation Age of Onset   Asthma Mother    Kidney failure Mother    Hypertension Daughter  Colon cancer Father        dx in his 73s   Stomach cancer Sister        stage 4   Colon cancer Maternal Uncle    Heart attack Maternal Grandmother    Asthma Brother    Cancer Neg Hx      Social History   Tobacco Use   Smoking status: Never   Smokeless tobacco: Never  Vaping Use   Vaping Use: Never used  Substance Use Topics   Alcohol use: No    Alcohol/week: 0.0 standard drinks of alcohol   Drug use: No    Allergies as of 02/10/2023 - Review Complete 02/10/2023  Allergen Reaction Noted   Codeine Nausea Only and Other (See Comments) 03/31/2015    Review of Systems:    All systems reviewed and negative except where noted in HPI.   Physical Exam:  BP 126/68   Pulse 64   Temp 98.6 F (37 C)   Ht 5\' 4"  (1.626 m)   Wt 143 lb 6.4 oz (65 kg)   BMI 24.61 kg/m  No LMP recorded. Patient has had a hysterectomy. Psych:  Alert and cooperative. Normal mood and affect. General:   Alert,  Well-developed, well-nourished, pleasant and cooperative in NAD Head:  Normocephalic and atraumatic. Eyes:  Sclera clear, no icterus.   Conjunctiva pink. Neck:  Supple; no masses or thyromegaly. Lungs:  Respirations even and unlabored.  Clear throughout to auscultation.   No wheezes, crackles, or rhonchi. No acute distress. Heart:  Regular rate and rhythm; no  murmurs, clicks, rubs, or gallops. Abdomen:  Normal bowel sounds.  No bruits.  Soft, and non-distended without masses, hepatosplenomegaly or hernias noted.  No Tenderness.  No guarding or rebound tenderness.    Neurologic:  Alert and oriented x3;  grossly normal neurologically. Psych:  Alert and cooperative. Normal mood and affect.  Imaging Studies: No results found.  Assessment and Plan:   Heather Cobb is a 82 y.o. y/o female has been referred for Chronic Cough. Differential includes Laryngopharyngeal Reflux Disease, Allergies, & Reactive Airway Disease.  Chronic Cough - Failed Multiple PPIs; Using Inhaler and Allergy meds. GERD Hx Hiatal Hernia  Plan: [facility] GI [facility]   GERD Med: Pantoprazole 40mg  QD + Pepcid 20mg  BID x 1-3 months.  If Voquezna gets approved, then she will switch from Pantoprazole to Voquezna. Recommend Lifestyle Modifications to prevent Acid Reflux.  Rec. Avoid coffee, sodas, peppermint, citrus fruits, and spicey foods.  Avoid eating 2-3 hours before bedtime.  F/U with Allergist & Pulmonologist.  Follow up in 1 month with TG  Celso Amy, PA-C

## 2023-02-10 ENCOUNTER — Ambulatory Visit (INDEPENDENT_AMBULATORY_CARE_PROVIDER_SITE_OTHER): Payer: Medicare PPO | Admitting: Physician Assistant

## 2023-02-10 ENCOUNTER — Encounter: Payer: Self-pay | Admitting: Family

## 2023-02-10 ENCOUNTER — Encounter: Payer: Self-pay | Admitting: Physician Assistant

## 2023-02-10 VITALS — BP 126/68 | HR 64 | Temp 98.6°F | Ht 64.0 in | Wt 143.4 lb

## 2023-02-10 DIAGNOSIS — K449 Diaphragmatic hernia without obstruction or gangrene: Secondary | ICD-10-CM

## 2023-02-10 DIAGNOSIS — K219 Gastro-esophageal reflux disease without esophagitis: Secondary | ICD-10-CM | POA: Diagnosis not present

## 2023-02-10 DIAGNOSIS — R053 Chronic cough: Secondary | ICD-10-CM | POA: Diagnosis not present

## 2023-02-10 MED ORDER — FAMOTIDINE 20 MG PO TABS: 20.0000 mg | ORAL_TABLET | Freq: Two times a day (BID) | ORAL | 5 refills | Status: DC

## 2023-02-10 MED ORDER — PANTOPRAZOLE SODIUM 40 MG PO TBEC: 40.0000 mg | DELAYED_RELEASE_TABLET | Freq: Every day | ORAL | 3 refills | Status: DC

## 2023-02-10 NOTE — Patient Instructions (Addendum)
GI series scheduled 02/20/23 @ 8:45 am [facility]. Nothing to eat/drink after midnight.   Food Choices for Gastroesophageal Reflux Disease, Adult When you have gastroesophageal reflux disease (GERD), the foods you eat and your eating habits are very important. Choosing the right foods can help ease your discomfort. Think about working with a food expert (dietitian) to help you make good choices. What are tips for following this plan? Reading food labels Look for foods that are low in saturated fat. Foods that may help with your symptoms include: Foods that have less than 5% of daily value (DV) of fat. Foods that have 0 grams of trans fat. Cooking Do not fry your food. Cook your food by baking, steaming, grilling, or broiling. These are all methods that do not need a lot of fat for cooking. To add flavor, try to use herbs that are low in spice and acidity. Meal planning  Choose healthy foods that are low in fat, such as: Fruits and vegetables. Whole grains. Low-fat dairy products. Lean meats, fish, and poultry. Eat small meals often instead of eating 3 large meals each day. Eat your meals slowly in a place where you are relaxed. Avoid bending over or lying down until 2-3 hours after eating. Limit high-fat foods such as fatty meats or fried foods. Limit your intake of fatty foods, such as oils, butter, and shortening. Avoid the following as told by your doctor: Foods that cause symptoms. These may be different for different people. Keep a food diary to keep track of foods that cause symptoms. Alcohol. Drinking a lot of liquid with meals. Eating meals during the 2-3 hours before bed. Lifestyle Stay at a healthy weight. Ask your doctor what weight is healthy for you. If you need to lose weight, work with your doctor to do so safely. Exercise for at least 30 minutes on 5 or more days each week, or as told by your doctor. Wear loose-fitting clothes. Do not smoke or use any products that contain  nicotine or tobacco. If you need help quitting, ask your doctor. Sleep with the head of your bed higher than your feet. Use a wedge under the mattress or blocks under the bed frame to raise the head of the bed. Chew sugar-free gum after meals. What foods should eat?  Eat a healthy, well-balanced diet of fruits, vegetables, whole grains, low-fat dairy products, lean meats, fish, and poultry. Each person is different. Foods that may cause symptoms in one person may not cause any symptoms in another person. Work with your doctor to find foods that are safe for you. The items listed above may not be a complete list of what you can eat and drink. Contact a food expert for more options. What foods should I avoid? Limiting some of these foods may help in managing the symptoms of GERD. Everyone is different. Talk with a food expert or your doctor to help you find the exact foods to avoid, if any. Fruits Any fruits prepared with added fat. Any fruits that cause symptoms. For some people, this may include citrus fruits, such as oranges, grapefruit, pineapple, and lemons. Vegetables Deep-fried vegetables. [place] fries. Any vegetables prepared with added fat. Any vegetables that cause symptoms. For some people, this may include tomatoes and tomato products, chili peppers, onions and garlic, and horseradish. Grains Pastries or quick breads with added fat. Meats and other proteins High-fat meats, such as fatty beef or pork, hot dogs, ribs, ham, sausage, salami, and bacon. Fried meat or protein,  including fried fish and fried chicken. Nuts and nut butters, in large amounts. Dairy Whole milk and chocolate milk. Sour cream. Cream. Ice cream. Cream cheese. Milkshakes. Fats and oils Butter. Margarine. Shortening. Ghee. Beverages Coffee and tea, with or without caffeine. Carbonated beverages. Sodas. Energy drinks. Fruit juice made with acidic fruits, such as orange or grapefruit. Tomato juice. Alcoholic  drinks. Sweets and desserts Chocolate and cocoa. Donuts. Seasonings and condiments Pepper. Peppermint and spearmint. Added salt. Any condiments, herbs, or seasonings that cause symptoms. For some people, this may include curry, hot sauce, or vinegar-based salad dressings. The items listed above may not be a complete list of what you should not eat and drink. Contact a food expert for more options. Questions to ask your doctor Diet and lifestyle changes are often the first steps that are taken to manage symptoms of GERD. If diet and lifestyle changes do not help, talk with your doctor about taking medicines. Where to find more information [employer] G[employer]intestinal [employer]: aboutgerd.org Summary When you have GERD, food and lifestyle choices are very important in easing your symptoms. Eat small meals often instead of 3 large meals a day. Eat your meals slowly and in a place where you are relaxed. Avoid bending over or lying down until 2-3 hours after eating. Limit high-fat foods such as fatty meats or fried foods. This information is not intended to replace advice given to you by your health care provider. Make sure you discuss any questions you have with your health care provider. Document Revised: 03/16/2020 Document Reviewed: 03/16/2020 Elsevier Patient Education  2024 [employer].

## 2023-02-10 NOTE — Telephone Encounter (Signed)
This can wait until tabitha returns

## 2023-02-11 DIAGNOSIS — M47816 Spondylosis without myelopathy or radiculopathy, lumbar region: Secondary | ICD-10-CM | POA: Diagnosis not present

## 2023-02-20 ENCOUNTER — Telehealth: Payer: Self-pay

## 2023-02-20 ENCOUNTER — Ambulatory Visit
Admission: RE | Admit: 2023-02-20 | Discharge: 2023-02-20 | Disposition: A | Discharge status: Home / Self Care | Payer: Medicare PPO | Source: Ambulatory Visit | Attending: Physician Assistant | Admitting: Physician Assistant

## 2023-02-20 ENCOUNTER — Other Ambulatory Visit: Payer: Self-pay | Admitting: Physician Assistant

## 2023-02-20 DIAGNOSIS — K449 Diaphragmatic hernia without obstruction or gangrene: Secondary | ICD-10-CM

## 2023-02-20 DIAGNOSIS — R053 Chronic cough: Secondary | ICD-10-CM

## 2023-02-20 DIAGNOSIS — K219 Gastro-esophageal reflux disease without esophagitis: Secondary | ICD-10-CM | POA: Diagnosis not present

## 2023-02-20 DIAGNOSIS — R059 Cough, unspecified: Secondary | ICD-10-CM | POA: Diagnosis not present

## 2023-02-20 NOTE — Telephone Encounter (Signed)
Left message for patient to return call to office.   Notify patient upper GI series is normal.  No evidence of hiatal hernia or acid reflux.  Esophageal motility and gastric emptying are normal.  Continue with current plan.

## 2023-02-20 NOTE — Telephone Encounter (Signed)
Pt returned Heather Cobb's call, I went over the results with pt and she expressed understanding

## 2023-02-20 NOTE — Progress Notes (Signed)
Notify patient upper GI series is normal.  No evidence of hiatal hernia or acid reflux.  Esophageal motility and gastric emptying are normal.  Continue with current plan.

## 2023-03-03 ENCOUNTER — Ambulatory Visit (INDEPENDENT_AMBULATORY_CARE_PROVIDER_SITE_OTHER): Payer: Medicare PPO | Admitting: Family

## 2023-03-03 ENCOUNTER — Encounter: Payer: Self-pay | Admitting: Family

## 2023-03-03 VITALS — BP 92/52 | HR 68 | Temp 98.5°F | Ht 64.0 in | Wt 141.6 lb

## 2023-03-03 DIAGNOSIS — U071 COVID-19: Secondary | ICD-10-CM | POA: Insufficient documentation

## 2023-03-03 DIAGNOSIS — Z20822 Contact with and (suspected) exposure to covid-19: Secondary | ICD-10-CM | POA: Diagnosis not present

## 2023-03-03 DIAGNOSIS — R051 Acute cough: Secondary | ICD-10-CM

## 2023-03-03 LAB — POC COVID19 BINAXNOW: SARS Coronavirus 2 Ag: POSITIVE — AB

## 2023-03-03 MED ORDER — MOLNUPIRAVIR EUA 200MG CAPSULE
4.0000 | ORAL_CAPSULE | Freq: Two times a day (BID) | ORAL | 0 refills | Status: AC
Start: 2023-03-03 — End: 2023-03-08

## 2023-03-03 MED ORDER — BENZONATATE 200 MG PO CAPS
200.0000 mg | ORAL_CAPSULE | Freq: Three times a day (TID) | ORAL | 0 refills | Status: DC | PRN
Start: 2023-03-03 — End: 2023-05-01

## 2023-03-03 NOTE — Progress Notes (Signed)
Established Patient Office Visit  Subjective:      CC:  Chief Complaint  Patient presents with   Cough    Productive cough, sneezing, chills x 2 days     HPI: Heather Cobb is a 82 y.o. female presenting on 03/03/2023 for Cough (Productive cough, sneezing, chills x 2 days ) . GERD: has been evaluated by GI 5/24 , they are trying to get voquezna approved and setting up for upper GI.   New complaints: Sudden onset two days ago with cough, runny nose, and then some chills. No fever. Decreased appetite. Coughing a lot more than usual with clear productive cough. No sinus pressure or ear pain.no sore throat.  Using albuterol, used a few times yesterday.   Social history:  Relevant past medical, surgical, family and social history reviewed and updated as indicated. Interim medical history since our last visit reviewed.  Allergies and medications reviewed and updated.  DATA REVIEWED: CHART IN EPIC     ROS: Negative unless specifically indicated above in HPI.    Current Outpatient Medications:    albuterol (VENTOLIN HFA) 108 (90 Base) MCG/ACT inhaler, Inhale 2 puffs into the lungs every 6 (six) hours as needed for wheezing or shortness of breath., Disp: 18 g, Rfl: 11   amLODipine (NORVASC) 5 MG tablet, Take 1 tablet (5 mg total) by mouth 2 (two) times daily., Disp: 180 tablet, Rfl: 3   aspirin EC 81 MG tablet, Take 81 mg by mouth daily., Disp: , Rfl:    cholecalciferol (VITAMIN D) 1000 UNITS tablet, Take 1,000 Units by mouth daily., Disp: , Rfl:    clotrimazole-betamethasone (LOTRISONE) cream, APPLY TOPICALLY TO THE AFFECTED AREA TWICE DAILY, Disp: 30 g, Rfl: 2   DULoxetine (CYMBALTA) 30 MG capsule, Take 1 capsule (30 mg total) by mouth daily., Disp: 90 capsule, Rfl: 3   famotidine (PEPCID) 20 MG tablet, Take 1 tablet (20 mg total) by mouth 2 (two) times daily., Disp: 60 tablet, Rfl: 5   ferrous sulfate 325 (65 FE) MG tablet, Take 325 mg by mouth daily with breakfast., Disp: ,  Rfl:    fluticasone (FLONASE) 50 MCG/ACT nasal spray, Place 2 sprays into both nostrils daily., Disp: 16 g, Rfl: 6   levothyroxine (SYNTHROID) 75 MCG tablet, Take 1 tablet (75 mcg total) by mouth daily before breakfast., Disp: 90 tablet, Rfl: 1   linaclotide (LINZESS) 145 MCG CAPS capsule, Take 1 capsule (145 mcg total) by mouth daily as needed (for constipation)., Disp: 90 capsule, Rfl: 3   loratadine (CLARITIN) 10 MG tablet, Take 1 tablet (10 mg total) by mouth daily., Disp: 90 tablet, Rfl: 3   methocarbamol (ROBAXIN-750) 750 MG tablet, Take 1 tablet (750 mg total) by mouth every 12 (twelve) hours as needed for muscle spasms., Disp: 60 tablet, Rfl: 3   molnupiravir EUA (LAGEVRIO) 200 mg CAPS capsule, Take 4 capsules (800 mg total) by mouth 2 (two) times daily for 5 days., Disp: 40 capsule, Rfl: 0   olmesartan (BENICAR) 40 MG tablet, TAKE 1 TABLET(40 MG) BY MOUTH DAILY, Disp: 90 tablet, Rfl: 3   pantoprazole (PROTONIX) 40 MG tablet, Take 1 tablet (40 mg total) by mouth daily., Disp: 90 tablet, Rfl: 3   spironolactone (ALDACTONE) 25 MG tablet, Take 1 tablet (25 mg total) by mouth daily. For blood pressure., Disp: 90 tablet, Rfl: 3   vitamin B-12 (CYANOCOBALAMIN) 1000 MCG tablet, Take 1,000 mcg by mouth daily. , Disp: , Rfl:    benzonatate (TESSALON) 200 MG  capsule, Take 1 capsule (200 mg total) by mouth 3 (three) times daily as needed for cough., Disp: 30 capsule, Rfl: 0      Objective:    BP (!) 92/52   Pulse 68   Temp 98.5 F (36.9 C) (Oral)   Ht 5\' 4"  (1.626 m)   Wt 141 lb 9.6 oz (64.2 kg)   SpO2 95%   BMI 24.31 kg/m   Wt Readings from Last 3 Encounters:  03/03/23 141 lb 9.6 oz (64.2 kg)  02/10/23 143 lb 6.4 oz (65 kg)  01/16/23 141 lb 6 oz (64.1 kg)    Physical Exam Constitutional:      General: She is not in acute distress.    Appearance: Normal appearance. She is normal weight. She is not ill-appearing, toxic-appearing or diaphoretic.  HENT:     Head: Normocephalic.      Right Ear: Tympanic membrane normal.     Left Ear: Tympanic membrane normal.     Nose: Nose normal.     Mouth/Throat:     Mouth: Mucous membranes are dry.     Pharynx: No oropharyngeal exudate or posterior oropharyngeal erythema.  Eyes:     Extraocular Movements: Extraocular movements intact.     Pupils: Pupils are equal, round, and reactive to light.  Cardiovascular:     Rate and Rhythm: Normal rate and regular rhythm.     Pulses: Normal pulses.     Heart sounds: Normal heart sounds.  Pulmonary:     Effort: Pulmonary effort is normal.     Breath sounds: Normal breath sounds. No decreased air movement. No decreased breath sounds, wheezing, rhonchi or rales.  Musculoskeletal:     Cervical back: Normal range of motion.  Neurological:     General: No focal deficit present.     Mental Status: She is alert and oriented to person, place, and time. Mental status is at baseline.  Psychiatric:        Mood and Affect: Mood normal.        Behavior: Behavior normal.        Thought Content: Thought content normal.        Judgment: Judgment normal.     Lab Results  Component Value Date   HGBA1C 5.8 (A) 04/09/2020         Assessment & Plan:  Acute cough -     POC COVID-19 BinaxNow -     Benzonatate; Take 1 capsule (200 mg total) by mouth 3 (three) times daily as needed for cough.  Dispense: 30 capsule; Refill: 0  Suspected COVID-19 virus infection  COVID-19 Assessment & Plan: Advised of CDC guidelines for self isolation/ ending isolation.  Advised of safe practice guidelines. Symptom Tier reviewed.  Encouraged to monitor for any worsening symptoms; watch for increased shortness of breath, weakness, and signs of dehydration. Advised when to seek emergency care.  Instructed to rest and hydrate well.  Advised to leave the house during recommended isolation period, only if it is necessary to seek medical care   Orders: -     molnupiravir EUA; Take 4 capsules (800 mg total) by mouth 2  (two) times daily for 5 days.  Dispense: 40 capsule; Refill: 0     Return in about 6 months (around 09/02/2023), or if symptoms worsen or fail to improve, for f/u CPE.  Mort Sawyers, MSN, APRN, FNP-C [facility]

## 2023-03-03 NOTE — Assessment & Plan Note (Signed)
Advised of CDC guidelines for self isolation/ ending isolation.  Advised of safe practice guidelines. Symptom Tier reviewed.  Encouraged to monitor for any worsening symptoms; watch for increased shortness of breath, weakness, and signs of dehydration. Advised when to seek emergency care.  Instructed to rest and hydrate well.  Advised to leave the house during recommended isolation period, only if it is necessary to seek medical care  ?

## 2023-03-03 NOTE — Patient Instructions (Signed)
Your COVID test is positive. You should remain isolated and quarantine for at least 5 days from start of symptoms. You must be feeling better and be fever free without any fever reducers for at least 24 hours as well. You should wear a mask at all times when out of your home or around others for 5 days after leaving isolation.  Your household contacts should be tested as well as work contacts. If you feel worse or have increasing shortness of breath, you should be seen in person at urgent care or the emergency room.   

## 2023-03-04 DIAGNOSIS — M47816 Spondylosis without myelopathy or radiculopathy, lumbar region: Secondary | ICD-10-CM | POA: Diagnosis not present

## 2023-03-06 ENCOUNTER — Ambulatory Visit (INDEPENDENT_AMBULATORY_CARE_PROVIDER_SITE_OTHER): Payer: Medicare PPO

## 2023-03-06 VITALS — Ht 64.0 in | Wt 146.0 lb

## 2023-03-06 DIAGNOSIS — Z0000 Encounter for general adult medical examination without abnormal findings: Secondary | ICD-10-CM

## 2023-03-06 NOTE — Patient Instructions (Signed)
Heather Cobb , Thank you for taking time to come for your Medicare Wellness Visit. I appreciate your ongoing commitment to your health goals. Please review the following plan we discussed and let me know if I can assist you in the future.   These are the goals we discussed:  Goals       Patient stated (pt-stated)      I would like to love travel more to see my great grandchildren       Patient Stated      No new goals        This is a list of the screening recommended for you and due dates:  Health Maintenance  Topic Date Due   DTaP/Tdap/Td vaccine (1 - Tdap) Never done   Pneumonia Vaccine (2 of 2 - PCV) 06/22/2021   Zoster (Shingles) Vaccine (1 of 2) 03/30/2023*   COVID-19 Vaccine (4 - 2023-24 season) 12/29/2023*   Flu Shot  04/20/2023   Medicare Annual Wellness Visit  03/05/2024   DEXA scan (bone density measurement)  Completed   HPV Vaccine  Aged Out  *Topic was postponed. The date shown is not the original due date.    Advanced directives: Please bring a copy of your health care power of attorney and living will to the office to be added to your chart at your convenience.   Conditions/risks identified: Aim for 30 minutes of exercise or brisk walking, 6-8 glasses of water, and 5 servings of fruits and vegetables each day.   Next appointment: Follow up in one year for your annual wellness visit 03/06/24 @ 10:45 telephone   Preventive Care 65 Years and Older, Female Preventive care refers to lifestyle choices and visits with your health care provider that can promote health and wellness. What does preventive care include? A yearly physical exam. This is also called an annual well check. Dental exams once or twice a year. Routine eye exams. Ask your health care provider how often you should have your eyes checked. Personal lifestyle choices, including: Daily care of your teeth and gums. Regular physical activity. Eating a healthy diet. Avoiding tobacco and drug  use. Limiting alcohol use. Practicing safe sex. Taking low-dose aspirin every day. Taking vitamin and mineral supplements as recommended by your health care provider. What happens during an annual well check? The services and screenings done by your health care provider during your annual well check will depend on your age, overall health, lifestyle risk factors, and family history of disease. Counseling  Your health care provider may ask you questions about your: Alcohol use. Tobacco use. Drug use. Emotional well-being. Home and relationship well-being. Sexual activity. Eating habits. History of falls. Memory and ability to understand (cognition). Work and work Astronomer. Reproductive health. Screening  You may have the following tests or measurements: Height, weight, and BMI. Blood pressure. Lipid and cholesterol levels. These may be checked every 5 years, or more frequently if you are over 22 years old. Skin check. Lung cancer screening. You may have this screening every year starting at age 86 if you have a 30-pack-year history of smoking and currently smoke or have quit within the past 15 years. Fecal occult blood test (FOBT) of the stool. You may have this test every year starting at age 30. Flexible sigmoidoscopy or colonoscopy. You may have a sigmoidoscopy every 5 years or a colonoscopy every 10 years starting at age 46. Hepatitis C blood test. Hepatitis B blood test. Sexually transmitted disease (STD) testing. Diabetes screening.  This is done by checking your blood sugar (glucose) after you have not eaten for a while (fasting). You may have this done every 1-3 years. Bone density scan. This is done to screen for osteoporosis. You may have this done starting at age 48. Mammogram. This may be done every 1-2 years. Talk to your health care provider about how often you should have regular mammograms. Talk with your health care provider about your test results, treatment  options, and if necessary, the need for more tests. Vaccines  Your health care provider may recommend certain vaccines, such as: Influenza vaccine. This is recommended every year. Tetanus, diphtheria, and acellular pertussis (Tdap, Td) vaccine. You may need a Td booster every 10 years. Zoster vaccine. You may need this after age 34. Pneumococcal 13-valent conjugate (PCV13) vaccine. One dose is recommended after age 69. Pneumococcal polysaccharide (PPSV23) vaccine. One dose is recommended after age 53. Talk to your health care provider about which screenings and vaccines you need and how often you need them. This information is not intended to replace advice given to you by your health care provider. Make sure you discuss any questions you have with your health care provider. Document Released: 10/02/2015 Document Revised: 05/25/2016 Document Reviewed: 07/07/2015 Elsevier Interactive Patient Education  2017 [place] Prevention in the Home Falls can cause injuries. They can happen to people of all ages. There are many things you can do to make your home safe and to help prevent falls. What can I do on the outside of my home? Regularly fix the edges of walkways and driveways and fix any cracks. Remove anything that might make you trip as you walk through a door, such as a raised step or threshold. Trim any bushes or trees on the path to your home. Use bright outdoor lighting. Clear any walking paths of anything that might make someone trip, such as rocks or tools. Regularly check to see if handrails are loose or broken. Make sure that both sides of any steps have handrails. Any raised decks and porches should have guardrails on the edges. Have any leaves, snow, or ice cleared regularly. Use sand or salt on walking paths during winter. Clean up any spills in your garage right away. This includes oil or grease spills. What can I do in the bathroom? Use night lights. Install grab  bars by the toilet and in the tub and shower. Do not use towel bars as grab bars. Use non-skid mats or decals in the tub or shower. If you need to sit down in the shower, use a plastic, non-slip stool. Keep the floor dry. Clean up any water that spills on the floor as soon as it happens. Remove soap buildup in the tub or shower regularly. Attach bath mats securely with double-sided non-slip rug tape. Do not have throw rugs and other things on the floor that can make you trip. What can I do in the bedroom? Use night lights. Make sure that you have a light by your bed that is easy to reach. Do not use any sheets or blankets that are too big for your bed. They should not hang down onto the floor. Have a firm chair that has side arms. You can use this for support while you get dressed. Do not have throw rugs and other things on the floor that can make you trip. What can I do in the kitchen? Clean up any spills right away. Avoid walking on wet floors. Keep items  that you use a lot in easy-to-reach places. If you need to reach something above you, use a strong step stool that has a grab bar. Keep electrical cords out of the way. Do not use floor polish or wax that makes floors slippery. If you must use wax, use non-skid floor wax. Do not have throw rugs and other things on the floor that can make you trip. What can I do with my stairs? Do not leave any items on the stairs. Make sure that there are handrails on both sides of the stairs and use them. Fix handrails that are broken or loose. Make sure that handrails are as long as the stairways. Check any carpeting to make sure that it is firmly attached to the stairs. Fix any carpet that is loose or worn. Avoid having throw rugs at the top or bottom of the stairs. If you do have throw rugs, attach them to the floor with carpet tape. Make sure that you have a light switch at the top of the stairs and the bottom of the stairs. If you do not have them,  ask someone to add them for you. What else can I do to help prevent falls? Wear shoes that: Do not have high heels. Have rubber bottoms. Are comfortable and fit you well. Are closed at the toe. Do not wear sandals. If you use a stepladder: Make sure that it is fully opened. Do not climb a closed stepladder. Make sure that both sides of the stepladder are locked into place. Ask someone to hold it for you, if possible. Clearly mark and make sure that you can see: Any grab bars or handrails. First and last steps. Where the edge of each step is. Use tools that help you move around (mobility aids) if they are needed. These include: Canes. Walkers. Scooters. Crutches. Turn on the lights when you go into a dark area. Replace any light bulbs as soon as they burn out. Set up your furniture so you have a clear path. Avoid moving your furniture around. If any of your floors are uneven, fix them. If there are any pets around you, be aware of where they are. Review your medicines with your doctor. Some medicines can make you feel dizzy. This can increase your chance of falling. Ask your doctor what other things that you can do to help prevent falls. This information is not intended to replace advice given to you by your health care provider. Make sure you discuss any questions you have with your health care provider. Document Released: 07/02/2009 Document Revised: 02/11/2016 Document Reviewed: 10/10/2014 Elsevier Interactive Patient Education  2017 [employer].

## 2023-03-06 NOTE — Progress Notes (Signed)
I connected with  Heather Cobb on 03/06/23 by a audio enabled telemedicine application and verified that I am speaking with the correct person using two identifiers.  Patient Location: Home  Provider Location: Office/Clinic  I discussed the limitations of evaluation and management by telemedicine. The patient expressed understanding and agreed to proceed.  Subjective:   Heather Cobb is a 82 y.o. female who presents for Medicare Annual (Subsequent) preventive examination.  Review of Systems      Cardiac Risk Factors include: advanced age (>19men, >32 women);sedentary lifestyle;hypertension     Objective:    Today's Vitals   03/06/23 1046  Weight: 146 lb (66.2 kg)  Height: 5\' 4"  (1.626 m)   Body mass index is 25.06 kg/m.     03/06/2023   10:58 AM 05/26/2022    9:30 AM 05/16/2022    9:17 AM 03/01/2022    1:49 PM 02/28/2022    3:52 PM 04/07/2021    8:03 AM 03/31/2020   11:14 AM  Advanced Directives  Does Patient Have a Medical Advance Directive? Yes No No No No No No  Type of Estate agent of [place];Living will        Copy of Healthcare Power of Attorney in Chart? No - copy requested        Would patient like information on creating a medical advance directive?  No - Patient declined No - Patient declined No - Patient declined No - Patient declined No - Patient declined No - Patient declined    Current Medications (verified) Outpatient Encounter Medications as of 03/06/2023  Medication Sig   albuterol (VENTOLIN HFA) 108 (90 Base) MCG/ACT inhaler Inhale 2 puffs into the lungs every 6 (six) hours as needed for wheezing or shortness of breath.   amLODipine (NORVASC) 5 MG tablet Take 1 tablet (5 mg total) by mouth 2 (two) times daily.   aspirin EC 81 MG tablet Take 81 mg by mouth daily.   benzonatate (TESSALON) 200 MG capsule Take 1 capsule (200 mg total) by mouth 3 (three) times daily as needed for cough.   cholecalciferol (VITAMIN D) 1000 UNITS tablet  Take 1,000 Units by mouth daily.   clotrimazole-betamethasone (LOTRISONE) cream APPLY TOPICALLY TO THE AFFECTED AREA TWICE DAILY   DULoxetine (CYMBALTA) 30 MG capsule Take 1 capsule (30 mg total) by mouth daily.   famotidine (PEPCID) 20 MG tablet Take 1 tablet (20 mg total) by mouth 2 (two) times daily.   ferrous sulfate 325 (65 FE) MG tablet Take 325 mg by mouth daily with breakfast.   fluticasone (FLONASE) 50 MCG/ACT nasal spray Place 2 sprays into both nostrils daily.   levothyroxine (SYNTHROID) 75 MCG tablet Take 1 tablet (75 mcg total) by mouth daily before breakfast.   linaclotide (LINZESS) 145 MCG CAPS capsule Take 1 capsule (145 mcg total) by mouth daily as needed (for constipation).   loratadine (CLARITIN) 10 MG tablet Take 1 tablet (10 mg total) by mouth daily.   methocarbamol (ROBAXIN-750) 750 MG tablet Take 1 tablet (750 mg total) by mouth every 12 (twelve) hours as needed for muscle spasms.   molnupiravir EUA (LAGEVRIO) 200 mg CAPS capsule Take 4 capsules (800 mg total) by mouth 2 (two) times daily for 5 days.   olmesartan (BENICAR) 40 MG tablet TAKE 1 TABLET(40 MG) BY MOUTH DAILY   pantoprazole (PROTONIX) 40 MG tablet Take 1 tablet (40 mg total) by mouth daily.   spironolactone (ALDACTONE) 25 MG tablet Take 1 tablet (25 mg total)  by mouth daily. For blood pressure.   vitamin B-12 (CYANOCOBALAMIN) 1000 MCG tablet Take 1,000 mcg by mouth daily.    No facility-administered encounter medications on file as of 03/06/2023.    Allergies (verified) Codeine   History: Past Medical History:  Diagnosis Date   Anemia    Carotid body tumor (HCC)    left   Family history of colon cancer    Hypertension    Hypothyroid    Pyelonephritis    Rectal bleeding 11/26/2015   Vertigo    Vitamin B 12 deficiency    Past Surgical History:  Procedure Laterality Date   ABDOMINAL HYSTERECTOMY     CATARACT EXTRACTION W/ INTRAOCULAR LENS  IMPLANT, BILATERAL     CHOLECYSTECTOMY N/A 04/15/2015    Procedure: LAPAROSCOPIC CHOLECYSTECTOMY;  Surgeon: Franky Macho, MD;  Location: [facility];  Service: General;  Laterality: N/A;   COLONOSCOPY  2012   [place]   COLONOSCOPY WITH PROPOFOL N/A 02/03/2016   Procedure: COLONOSCOPY WITH PROPOFOL;  Surgeon: Earline Mayotte, MD;  Location: [facility];  Service: Endoscopy;  Laterality: N/A;   EYE SURGERY Bilateral    cataract extractions   HEMORRHOID SURGERY N/A 08/31/2018   Procedure: HEMORRHOIDECTOMY;  Surgeon: Earline Mayotte, MD;  Location: [facility];  Service: General;  Laterality: N/A;   IR ANGIO EXTERNAL CAROTID SEL EXT CAROTID UNI L MOD SED  10/26/2017   IR ANGIO INTRA EXTRACRAN SEL COM CAROTID INNOMINATE BILAT MOD SED  10/26/2017   IR ANGIO VERTEBRAL SEL VERTEBRAL UNI R MOD SED  10/26/2017   IR ANGIOGRAM EXTREMITY LEFT  10/26/2017   IR ANGIOGRAM FOLLOW UP STUDY  10/26/2017   IR NEURO EACH ADD'L AFTER BASIC UNI LEFT (MS)  10/26/2017   IR RADIOLOGIST EVAL & MGMT  09/21/2017   IR TRANSCATH/EMBOLIZ  10/26/2017   MASS EXCISION Left 10/27/2017   Procedure: EXCISION CAROTID BODY TUMOR;  Surgeon: Christia Reading, MD;  Location: [facility];  Service: ENT;  Laterality: Left;   RADIOLOGY WITH ANESTHESIA N/A 10/26/2017   Procedure: EMBOLIZATION;  Surgeon: Julieanne Cotton, MD;  Location: [facility];  Service: Radiology;  Laterality: N/A;   SIGMOIDOSCOPY N/A 08/31/2018   Procedure: SIGMOIDOSCOPY-IN  OR;  Surgeon: Earline Mayotte, MD;  Location: [facility];  Service: General;  Laterality: N/A;   UPPER GASTROINTESTINAL ENDOSCOPY  2014   VESICOVAGINAL FISTULA CLOSURE W/ TAH     Family History  Problem Relation Age of Onset   Asthma Mother    Kidney failure Mother    Hypertension Daughter    Colon cancer Father        dx in his 60s   Stomach cancer Sister        stage 4   Colon cancer Maternal Uncle    Heart attack Maternal Grandmother    Asthma Brother    Cancer Neg Hx    Social History   Socioeconomic History   Marital status: Widowed    Spouse name: Not on  file   Number of children: 1   Years of education: high school   Highest education level: 12th grade  Occupational History   Occupation: Scientist, product/process development    Comment: retired  Tobacco Use   Smoking status: Never   Smokeless tobacco: Never  Building services engineer Use: Never used  Substance and Sexual Activity   Alcohol use: No    Alcohol/week: 0.0 standard drinks of alcohol   Drug use: No   Sexual activity: Not Currently    Birth  control/protection: Surgical  Other Topics Concern   Not on file  Social History Narrative   10/15/19   From: the area   Living: with Daugher [name]) and son-in-law   Work: retired from [employer] work, 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      Family: lives with daughter, has 1 grandson and 3 great-grandchildren and 1 great-great-grandchild      Enjoys: eat, dance, travel, cooking      Exercise: walk - not as much in cold, will do 1 mile/day   Diet: good, tries to eat health      Safety   Seat belts: Yes    Guns: Yes  and secure   Safe in relationships: Yes    Social Determinants of Health   Financial Resource Strain: Low Risk  (03/06/2023)   Overall Financial Resource Strain (CARDIA)    Difficulty of Paying Living Expenses: Not hard at all  Food Insecurity: No Food Insecurity (03/06/2023)   Hunger Vital Sign    Worried About Running Out of Food in the Last Year: Never true    Ran Out of Food in the Last Year: Never true  Transportation Needs: No Transportation Needs (03/06/2023)   PRAPARE - Administrator, Civil Service (Medical): No    Lack of Transportation (Non-Medical): No  Physical Activity: Insufficiently Active (03/06/2023)   Exercise Vital Sign    Days of Exercise per Week: 1 day    Minutes of Exercise per Session: 30 min  Stress: No Stress Concern Present (03/06/2023)   [employer] of Occupational Health - Occupational Stress Questionnaire    Feeling of Stress : Not at all  Social Connections: Moderately Integrated (03/06/2023)    Social Connection and Isolation Panel [NHANES]    Frequency of Communication with Friends and Family: More than three times a week    Frequency of Social Gatherings with Friends and Family: More than three times a week    Attends Religious Services: More than 4 times per year    Active Member of [employer] or Organizations: Yes    Attends Banker Meetings: More than 4 times per year    Marital Status: Widowed    Tobacco Counseling Counseling given: Not Answered   Clinical Intake:  Pre-visit preparation completed: Yes  Pain : No/denies pain     Nutritional Risks: None Diabetes: No  How often do you need to have someone help you when you read instructions, pamphlets, or other written materials from your doctor or pharmacy?: 1 - Never  Diabetic? no  Interpreter Needed?: No  Information entered by :: C.Rhilee Currin LPN   Activities of Daily Living    03/06/2023   11:00 AM 02/28/2023    7:46 AM  In your present state of health, do you have any difficulty performing the following activities:  Hearing? 0 0  Vision? 0 0  Difficulty concentrating or making decisions? 0 0  Walking or climbing stairs? 0 0  Dressing or bathing? 0 0  Doing errands, shopping? 0 0  Preparing Food and eating ? N N  Using the Toilet? N N  In the past six months, have you accidently leaked urine? N N  Do you have problems with loss of bowel control? N N  Managing your Medications? N N  Managing your Finances? N N  Housekeeping or managing your Housekeeping? N N    Patient Care Team: Mort Sawyers, FNP as PCP - General (Family Medicine) Julieanne Cotton, MD as Consulting Physician (  Interventional Radiology)  Indicate any recent Medical Services you may have received from other than [facility] providers in the past year (date may be approximate).     Assessment:   This is a routine wellness examination for Heather Cobb.  Hearing/Vision screen Hearing Screening - Comments:: Has occasional hearing  difficulty Vision Screening - Comments:: Glasses - [facility]  Dietary issues and exercise activities discussed: Current Exercise Habits: Home exercise routine, Type of exercise: walking, Time (Minutes): 30, Frequency (Times/Week): 1, Weekly Exercise (Minutes/Week): 30, Intensity: Mild, Exercise limited by: None identified   Goals Addressed             This Visit's Progress    Patient Stated       No new goals       Depression Screen    03/06/2023   10:57 AM 01/16/2023   12:29 PM 12/29/2022    8:10 AM 10/03/2022   11:26 AM 07/01/2022   10:13 AM 05/12/2022   10:51 AM 03/01/2022    1:43 PM  PHQ 2/9 Scores  PHQ - 2 Score 0 0 0 0 0 3 0  PHQ- 9 Score 0 0    3     Fall Risk    03/06/2023   10:59 AM 03/03/2023    9:10 AM 02/28/2023    7:46 AM 01/16/2023   12:29 PM 12/29/2022    8:10 AM  Fall Risk   Falls in the past year? 0 0 0 0 0  Number falls in past yr: 0 0 0 0 0  Injury with Fall? 0 0 0 0 0  Risk for fall due to : No Fall Risks No Fall Risks  No Fall Risks   Follow up Falls prevention discussed;Falls evaluation completed Falls evaluation completed  Falls evaluation completed Falls evaluation completed;Education provided;Falls prevention discussed    FALL RISK PREVENTION PERTAINING TO THE HOME:  Any stairs in or around the home? Yes  If so, are there any without handrails? No  Home free of loose throw rugs in walkways, pet beds, electrical cords, etc? Yes  Adequate lighting in your home to reduce risk of falls? Yes   ASSISTIVE DEVICES UTILIZED TO PREVENT FALLS:  Life alert? No  Use of a cane, walker or w/c? No  Grab bars in the bathroom? Yes  Shower chair or bench in shower? Yes  Elevated toilet seat or a handicapped toilet? Yes    Cognitive Function:        03/06/2023   11:00 AM 03/01/2022    1:49 PM  6CIT Screen  What Year? 0 points 0 points  What month? 0 points 0 points  What time? 0 points 0 points  Count back from 20 0 points 0 points  Months in  reverse 0 points 0 points  Repeat phrase 0 points 0 points  Total Score 0 points 0 points    Immunizations Immunization History  Administered Date(s) Administered   Fluad Quad(high Dose 65+) 06/04/2020   Influenza, High Dose Seasonal PF 06/19/2019   Influenza-Unspecified 07/02/2021, 06/08/2022   PFIZER(Purple Top)SARS-COV-2 Vaccination 10/29/2019, 11/19/2019, 07/06/2020   Pneumococcal Polysaccharide-23 06/22/2020   Respiratory Syncytial Virus Vaccine,Recomb Aduvanted(Arexvy) 06/08/2022    TDAP status: Due, Education has been provided regarding the importance of this vaccine. Advised may receive this vaccine at local pharmacy or Health Dept. Aware to provide a copy of the vaccination record if obtained from local pharmacy or Health Dept. Verbalized acceptance and understanding.  Flu Vaccine status: Up to date  Pneumococcal vaccine status:  Up to date  Covid-19 vaccine status: Declined, Education has been provided regarding the importance of this vaccine but patient still declined. Advised may receive this vaccine at local pharmacy or Health Dept.or vaccine clinic. Aware to provide a copy of the vaccination record if obtained from local pharmacy or Health Dept. Verbalized acceptance and understanding.  Qualifies for Shingles Vaccine? Yes   Zostavax completed No   Shingrix Completed?: Yes  Screening Tests Health Maintenance  Topic Date Due   DTaP/Tdap/Td (1 - Tdap) Never done   Pneumonia Vaccine 4+ Years old (2 of 2 - PCV) 06/22/2021   Zoster Vaccines- Shingrix (1 of 2) 03/30/2023 (Originally 11/26/1990)   COVID-19 Vaccine (4 - 2023-24 season) 12/29/2023 (Originally 05/20/2022)   INFLUENZA VACCINE  04/20/2023   Medicare Annual Wellness (AWV)  03/05/2024   DEXA SCAN  Completed   HPV VACCINES  Aged Out    Health Maintenance  Health Maintenance Due  Topic Date Due   DTaP/Tdap/Td (1 - Tdap) Never done   Pneumonia Vaccine 9+ Years old (2 of 2 - PCV) 06/22/2021    Colorectal  cancer screening: No longer required.   Mammogram status: No longer required due to age.  Bone Density status: Completed 11/11/22. Results reflect: Bone density results: OSTEOPENIA. Repeat every 2 years.  Lung Cancer Screening: (Low Dose CT Chest recommended if Age 35-80 years, 30 pack-year currently smoking OR have quit w/in 15years.) does not qualify.   Lung Cancer Screening Referral: no  Additional Screening:  Hepatitis C Screening: does not qualify  Vision Screening: Recommended annual ophthalmology exams for early detection of glaucoma and other disorders of the eye. Is the patient up to date with their annual eye exam?  Yes  Who is the provider or what is the name of the office in which the patient attends annual eye exams? [facility] If pt is not established with a provider, would they like to be referred to a provider to establish care? Yes .   Dental Screening: Recommended annual dental exams for proper oral hygiene  Community Resource Referral / Chronic Care Management: CRR required this visit?  No   CCM required this visit?  No      Plan:     I have personally reviewed and noted the following in the patient's chart:   Medical and social history Use of alcohol, tobacco or illicit drugs  Current medications and supplements including opioid prescriptions. Patient is not currently taking opioid prescriptions. Functional ability and status Nutritional status Physical activity Advanced directives List of other physicians Hospitalizations, surgeries, and ER visits in previous 12 months Vitals Screenings to include cognitive, depression, and falls Referrals and appointments  In addition, I have reviewed and discussed with patient certain preventive protocols, quality metrics, and best practice recommendations. A written personalized care plan for preventive services as well as general preventive health recommendations were provided to patient.     Maryan Puls, LPN   1/61/0960   Nurse Notes: Pt states she is up to date and will bring documentation from pharmacy to next ov.

## 2023-03-07 ENCOUNTER — Encounter: Payer: Self-pay | Admitting: Family

## 2023-03-07 DIAGNOSIS — I10 Essential (primary) hypertension: Secondary | ICD-10-CM

## 2023-03-07 MED ORDER — AMLODIPINE BESYLATE 5 MG PO TABS
5.0000 mg | ORAL_TABLET | Freq: Two times a day (BID) | ORAL | 3 refills | Status: DC
Start: 2023-03-07 — End: 2024-01-12

## 2023-03-07 MED ORDER — SPIRONOLACTONE 25 MG PO TABS
25.0000 mg | ORAL_TABLET | Freq: Every day | ORAL | 3 refills | Status: DC
Start: 2023-03-07 — End: 2024-03-18

## 2023-03-14 DIAGNOSIS — M47816 Spondylosis without myelopathy or radiculopathy, lumbar region: Secondary | ICD-10-CM | POA: Diagnosis not present

## 2023-03-21 ENCOUNTER — Encounter: Payer: Self-pay | Admitting: Physical Medicine & Rehabilitation

## 2023-03-21 ENCOUNTER — Encounter: Payer: Medicare PPO | Attending: Physical Medicine & Rehabilitation | Admitting: Physical Medicine & Rehabilitation

## 2023-03-21 VITALS — BP 146/69 | HR 57 | Ht 64.0 in | Wt 143.6 lb

## 2023-03-21 DIAGNOSIS — M47816 Spondylosis without myelopathy or radiculopathy, lumbar region: Secondary | ICD-10-CM

## 2023-03-21 MED ORDER — DULOXETINE HCL 30 MG PO CPEP: 30.0000 mg | ORAL_CAPSULE | Freq: Every day | ORAL | 3 refills | Status: AC

## 2023-03-21 NOTE — Progress Notes (Unsigned)
Celso Amy, PA-C [address]  [address]  [place], [place] [address]  Main: [phone]  Fax: [phone]   Primary Care Physician: Mort Sawyers, FNP  Primary Gastroenterologist:  Celso Amy, PA-C   CC: F/U Chronic Cough, GERD  HPI: Heather Cobb is a 82 y.o. female returns for 5-week follow-up of chronic cough.  Has chronic cough attributed to GERD vs allergies vs reactive airway disease.  She denies chest pain, heartburn, dysphagia, or abdominal pain.  Has tried pantoprazole, famotidine, Nexium, Dexilant, and Pepcid.  For the past month she has been taking pantoprazole 40 Mg daily and Pepcid 20 Mg twice daily.  Voquezna not approved.  Upper GI series 02/20/2023 showed no significant abnormality.  No evidence of spontaneous or provoked acid reflux.  No hiatal hernia.  Has allergies.  Uses albuterol inhaler and Flonase.  Recently started on Loratidine (Claritin).  She has not seen a pulmonologist.  She admits to occasional shortness of breath when she has a lot of coughing.  Current Symptoms: She continues to have chronic dry cough.  Cough is random and can occur throughout the day or at night.  Mostly dry cough.  Sometimes productive.  She has a lot of shortness of breath during coughing episodes.  Has used inhaler which helps.  She denies chest pain, heartburn, dysphagia, or abdominal pain.   06/2022 - CXR - Normal; NAD 03/2022 - Neck CT - (Hx paraganglioma s/p excision 2019) - NO recurrence. Saw GI Dr. Bosie Clos in [place] in 2014 for IDA. 01/2016 - Colonoscopy - Dr. Lemar Livings - Sig TICS, Int Hemorrh, NO polyps, Good prep. 04/2013 - EGD - Dr. Bosie Clos - Hiatal Hernia, Mod Gastritis, OTW Normal. 11/2010 - Colonoscopy - Dr. Bosie Clos - No Polyps. 07/2005 - Colonoscopy - Dr. Bosie Clos - 3 small rectal polyps.   Current Outpatient Medications  Medication Sig Dispense Refill   albuterol (VENTOLIN HFA) 108 (90 Base) MCG/ACT inhaler Inhale 2 puffs into the lungs every 6 (six) hours as  needed for wheezing or shortness of breath. 18 g 11   amLODipine (NORVASC) 5 MG tablet Take 1 tablet (5 mg total) by mouth 2 (two) times daily. 180 tablet 3   aspirin EC 81 MG tablet Take 81 mg by mouth daily.     benzonatate (TESSALON) 200 MG capsule Take 1 capsule (200 mg total) by mouth 3 (three) times daily as needed for cough. 30 capsule 0   cholecalciferol (VITAMIN D) 1000 UNITS tablet Take 1,000 Units by mouth daily.     clotrimazole-betamethasone (LOTRISONE) cream APPLY TOPICALLY TO THE AFFECTED AREA TWICE DAILY 30 g 2   DULoxetine (CYMBALTA) 30 MG capsule Take 1 capsule (30 mg total) by mouth daily. 90 capsule 3   famotidine (PEPCID) 20 MG tablet Take 1 tablet (20 mg total) by mouth 2 (two) times daily. 60 tablet 5   ferrous sulfate 325 (65 FE) MG tablet Take 325 mg by mouth daily with breakfast.     fluticasone (FLONASE) 50 MCG/ACT nasal spray Place 2 sprays into both nostrils daily. 16 g 6   levothyroxine (SYNTHROID) 75 MCG tablet Take 1 tablet (75 mcg total) by mouth daily before breakfast. 90 tablet 1   linaclotide (LINZESS) 145 MCG CAPS capsule Take 1 capsule (145 mcg total) by mouth daily as needed (for constipation). 90 capsule 3   loratadine (CLARITIN) 10 MG tablet Take 1 tablet (10 mg total) by mouth daily. 90 tablet 3   methocarbamol (ROBAXIN-750) 750 MG tablet Take 1  tablet (750 mg total) by mouth every 12 (twelve) hours as needed for muscle spasms. 60 tablet 3   olmesartan (BENICAR) 40 MG tablet TAKE 1 TABLET(40 MG) BY MOUTH DAILY 90 tablet 3   pantoprazole (PROTONIX) 40 MG tablet Take 1 tablet (40 mg total) by mouth daily. 90 tablet 3   spironolactone (ALDACTONE) 25 MG tablet Take 1 tablet (25 mg total) by mouth daily. For blood pressure. 90 tablet 3   vitamin B-12 (CYANOCOBALAMIN) 1000 MCG tablet Take 1,000 mcg by mouth daily.      No current facility-administered medications for this visit.    Allergies as of 03/22/2023 - Review Complete 03/22/2023  Allergen Reaction  Noted   Codeine Nausea Only and Other (See Comments) 03/31/2015    Past Medical History:  Diagnosis Date   Anemia    Carotid body tumor (HCC)    left   Family history of colon cancer    Hypertension    Hypothyroid    Pyelonephritis    Rectal bleeding 11/26/2015   Vertigo    Vitamin B 12 deficiency     Past Surgical History:  Procedure Laterality Date   ABDOMINAL HYSTERECTOMY     CATARACT EXTRACTION W/ INTRAOCULAR LENS  IMPLANT, BILATERAL     CHOLECYSTECTOMY N/A 04/15/2015   Procedure: LAPAROSCOPIC CHOLECYSTECTOMY;  Surgeon: Franky Macho, MD;  Location: [facility];  Service: General;  Laterality: N/A;   COLONOSCOPY  2012   [place]   COLONOSCOPY WITH PROPOFOL N/A 02/03/2016   Procedure: COLONOSCOPY WITH PROPOFOL;  Surgeon: Earline Mayotte, MD;  Location: [facility];  Service: Endoscopy;  Laterality: N/A;   EYE SURGERY Bilateral    cataract extractions   HEMORRHOID SURGERY N/A 08/31/2018   Procedure: HEMORRHOIDECTOMY;  Surgeon: Earline Mayotte, MD;  Location: [facility];  Service: General;  Laterality: N/A;   IR ANGIO EXTERNAL CAROTID SEL EXT CAROTID UNI L MOD SED  10/26/2017   IR ANGIO INTRA EXTRACRAN SEL COM CAROTID INNOMINATE BILAT MOD SED  10/26/2017   IR ANGIO VERTEBRAL SEL VERTEBRAL UNI R MOD SED  10/26/2017   IR ANGIOGRAM EXTREMITY LEFT  10/26/2017   IR ANGIOGRAM FOLLOW UP STUDY  10/26/2017   IR NEURO EACH ADD'L AFTER BASIC UNI LEFT (MS)  10/26/2017   IR RADIOLOGIST EVAL & MGMT  09/21/2017   IR TRANSCATH/EMBOLIZ  10/26/2017   MASS EXCISION Left 10/27/2017   Procedure: EXCISION CAROTID BODY TUMOR;  Surgeon: Christia Reading, MD;  Location: [facility];  Service: ENT;  Laterality: Left;   RADIOLOGY WITH ANESTHESIA N/A 10/26/2017   Procedure: EMBOLIZATION;  Surgeon: Julieanne Cotton, MD;  Location: [facility];  Service: Radiology;  Laterality: N/A;   SIGMOIDOSCOPY N/A 08/31/2018   Procedure: SIGMOIDOSCOPY-IN  OR;  Surgeon: Earline Mayotte, MD;  Location: [facility];  Service: General;  Laterality:  N/A;   UPPER GASTROINTESTINAL ENDOSCOPY  2014   VESICOVAGINAL FISTULA CLOSURE W/ TAH      Review of Systems:    All systems reviewed and negative except where noted in HPI.   Physical Examination:   BP 124/70   Pulse (!) 59   Temp 98.2 F (36.8 C)   Ht 5\' 4"  (1.626 m)   Wt 144 lb (65.3 kg)   BMI 24.72 kg/m   General: Well-nourished, well-developed in no acute distress.  Eyes: No icterus. Conjunctivae pink. Mouth: Oropharyngeal mucosa moist and pink , no lesions erythema or exudate. Lungs: Mild expiratory wheeze in the upper lung fields, otherwise clear to auscultation bilaterally. Non-labored.  No active coughing. Heart: Regular rate and rhythm, no murmurs rubs or gallops.  Abdomen: Bowel sounds are normal; Abdomen is Soft; No hepatosplenomegaly, masses or hernias;  No Abdominal Tenderness; No guarding or rebound tenderness. Extremities: No lower extremity edema. No clubbing or deformities. Neuro: Alert and oriented x 3.  Grossly intact. Skin: Warm and dry, no jaundice.   Psych: Alert and cooperative, normal mood and affect.   Imaging Studies: No results found.  Assessment and Plan:   Heather Cobb is a 82 y.o. female who returns for follow-up of Chronic Cough. Differential includes Laryngopharyngeal Reflux Disease, Allergies, & Reactive Airway Disease.  Recent upper GI series showed no hiatal hernia and no evidence of acid reflux on pantoprazole 40 Mg daily and famotidine 20 Mg twice daily.  It appears that her GERD is under excellent control.  I am recommending pulmonology evaluation.   Chronic Cough - Failed Multiple PPIs; Using Inhaler and Allergy meds.  Recent upper GI series was normal.  No evidence of hiatal hernia or GERD.  Referring to pulmonologist for further evaluation.  GERD -under excellent control.  Continue pantoprazole 40 Mg daily and famotidine 20 Mg twice daily.  Continue avoiding GERD trigger foods.  Hx Hiatal Hernia Reassurance, recent upper GI  series showed NO hiatal hernia.    Celso Amy, PA-C  Follow up as needed.

## 2023-03-21 NOTE — Progress Notes (Signed)
Subjective:    Patient ID: Heather Cobb, female    DOB: [birth year], 82 y.o.   MRN: [id number]  HPI HPI 05/12/22 Heather Cobb is a 82 year old female with past medical history of paraganglioma, CKD, prediabetes who is here for back pain that has been worsening for about 3 months.  Patient was previously active and taking walks of about a mile.  She developed pain in her back that shoots down her leg to her lateral ankle on the right.  Pain is worse with walking and standing.  Bending forward or backwards does not change the pain.  She tried gabapentin 100 mg 3 times daily however this did not help her pain and resulted in sedation so she stopped using this.  Extra strength Tylenol provides some benefit, she does not take NSAIDs due to her kidney disease.  No bowel or bladder changes, no new numbness or weakness.  She did try a couple doses of Robaxin and found that this helped her pain.   Visit 07/01/22 Heather Cobb is here for follow-up of her chronic lower back pain.  She reports that her pain is much improved since starting the Robaxin.  The medication is not causing her any significant side effects at this time.  She was initially using this 2 times a day but now is only using it as needed mostly at night when her pain is severe.    She is able to walk better and be more active with the medication. She has not tried the Cymbalta but will consider trying it if the pain worsened..     Interval History 10/03/2022 Heather Cobb is here for follow-up regarding her chronic lower back pain.  She reports she is doing well overall.  She has tried the duloxetine and this has been helping to control her pain.  She reports the Robaxin also worked well.  When she does not use 1 of these 2 medications her pain is not well-controlled.  She feels like she is sleeping better with duloxetine and this is possibly causing some sedation however she is not sure.  No additional side effects with the medications.  Interval  history 03/21/2023 Heather Cobb is here for follow-up regarding her back pain.  Back pain continues to be very well-controlled with duloxetine.  She occasionally takes Robaxin and this also controls her pain.  She has been active, working in the garden and doing things around the house without significant difficulty.  She has no new concerns today.   Pain Inventory Average Pain 0 Pain Right Now 0 My pain is  na  In the last 24 hours, has pain interfered with the following? General activity 0 Relation with others 0 Enjoyment of life 0 What TIME of day is your pain at its worst? na Sleep (in general) Good  Pain is worse with:  na Pain improves with:  na Relief from Meds:  na  Family History  Problem Relation Age of Onset   Asthma Mother    Kidney failure Mother    Hypertension Daughter    Colon cancer Father        dx in his 51s   Stomach cancer Sister        stage 4   Colon cancer Maternal Uncle    Heart attack Maternal Grandmother    Asthma Brother    Cancer Neg Hx    Social History   Socioeconomic History   Marital status: Widowed    Spouse name: Not  on file   Number of children: 1   Years of education: high school   Highest education level: 12th grade  Occupational History   Occupation: Scientist, product/process development    Comment: retired  Tobacco Use   Smoking status: Never   Smokeless tobacco: Never  Vaping Use   Vaping Use: Never used  Substance and Sexual Activity   Alcohol use: No    Alcohol/week: 0.0 standard drinks of alcohol   Drug use: No   Sexual activity: Not Currently    Birth control/protection: Surgical  Other Topics Concern   Not on file  Social History Narrative   10/15/19   From: the area   Living: with Daugher Heather Cobb) and son-in-law   Work: retired from [employer] work, 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      Family: lives with daughter, has 1 grandson and 3 great-grandchildren and 1 great-great-grandchild      Enjoys: eat, dance, travel, cooking      Exercise:  walk - not as much in cold, will do 1 mile/day   Diet: good, tries to eat health      Safety   Seat belts: Yes    Guns: Yes  and secure   Safe in relationships: Yes    Social Determinants of Health   Financial Resource Strain: Low Risk  (03/06/2023)   Overall Financial Resource Strain (CARDIA)    Difficulty of Paying Living Expenses: Not hard at all  Food Insecurity: No Food Insecurity (03/06/2023)   Hunger Vital Sign    Worried About Running Out of Food in the Last Year: Never true    Ran Out of Food in the Last Year: Never true  Transportation Needs: No Transportation Needs (03/06/2023)   PRAPARE - Administrator, Civil Service (Medical): No    Lack of Transportation (Non-Medical): No  Physical Activity: Insufficiently Active (03/06/2023)   Exercise Vital Sign    Days of Exercise per Week: 1 day    Minutes of Exercise per Session: 30 min  Stress: No Stress Concern Present (03/06/2023)   [employer] of Occupational Health - Occupational Stress Questionnaire    Feeling of Stress : Not at all  Social Connections: Moderately Integrated (03/06/2023)   Social Connection and Isolation Panel [NHANES]    Frequency of Communication with Friends and Family: More than three times a week    Frequency of Social Gatherings with Friends and Family: More than three times a week    Attends Religious Services: More than 4 times per year    Active Member of [employer] or Organizations: Yes    Attends Banker Meetings: More than 4 times per year    Marital Status: Widowed   Past Surgical History:  Procedure Laterality Date   ABDOMINAL HYSTERECTOMY     CATARACT EXTRACTION W/ INTRAOCULAR LENS  IMPLANT, BILATERAL     CHOLECYSTECTOMY N/A 04/15/2015   Procedure: LAPAROSCOPIC CHOLECYSTECTOMY;  Surgeon: Franky Macho, MD;  Location: [facility];  Service: General;  Laterality: N/A;   COLONOSCOPY  2012   [place]   COLONOSCOPY WITH PROPOFOL N/A 02/03/2016   Procedure: COLONOSCOPY WITH  PROPOFOL;  Surgeon: Earline Mayotte, MD;  Location: [facility];  Service: Endoscopy;  Laterality: N/A;   EYE SURGERY Bilateral    cataract extractions   HEMORRHOID SURGERY N/A 08/31/2018   Procedure: HEMORRHOIDECTOMY;  Surgeon: Earline Mayotte, MD;  Location: [facility];  Service: General;  Laterality: N/A;   IR ANGIO EXTERNAL CAROTID SEL EXT  CAROTID UNI L MOD SED  10/26/2017   IR ANGIO INTRA EXTRACRAN SEL COM CAROTID INNOMINATE BILAT MOD SED  10/26/2017   IR ANGIO VERTEBRAL SEL VERTEBRAL UNI R MOD SED  10/26/2017   IR ANGIOGRAM EXTREMITY LEFT  10/26/2017   IR ANGIOGRAM FOLLOW UP STUDY  10/26/2017   IR NEURO EACH ADD'L AFTER BASIC UNI LEFT (MS)  10/26/2017   IR RADIOLOGIST EVAL & MGMT  09/21/2017   IR TRANSCATH/EMBOLIZ  10/26/2017   MASS EXCISION Left 10/27/2017   Procedure: EXCISION CAROTID BODY TUMOR;  Surgeon: Christia Reading, MD;  Location: [facility] OR;  Service: ENT;  Laterality: Left;   RADIOLOGY WITH ANESTHESIA N/A 10/26/2017   Procedure: EMBOLIZATION;  Surgeon: Julieanne Cotton, MD;  Location: [facility];  Service: Radiology;  Laterality: N/A;   SIGMOIDOSCOPY N/A 08/31/2018   Procedure: SIGMOIDOSCOPY-IN  OR;  Surgeon: Earline Mayotte, MD;  Location: [facility];  Service: General;  Laterality: N/A;   UPPER GASTROINTESTINAL ENDOSCOPY  2014   VESICOVAGINAL FISTULA CLOSURE W/ TAH     Past Surgical History:  Procedure Laterality Date   ABDOMINAL HYSTERECTOMY     CATARACT EXTRACTION W/ INTRAOCULAR LENS  IMPLANT, BILATERAL     CHOLECYSTECTOMY N/A 04/15/2015   Procedure: LAPAROSCOPIC CHOLECYSTECTOMY;  Surgeon: Franky Macho, MD;  Location: [facility];  Service: General;  Laterality: N/A;   COLONOSCOPY  2012   [place]   COLONOSCOPY WITH PROPOFOL N/A 02/03/2016   Procedure: COLONOSCOPY WITH PROPOFOL;  Surgeon: Earline Mayotte, MD;  Location: [facility];  Service: Endoscopy;  Laterality: N/A;   EYE SURGERY Bilateral    cataract extractions   HEMORRHOID SURGERY N/A 08/31/2018   Procedure: HEMORRHOIDECTOMY;   Surgeon: Earline Mayotte, MD;  Location: [facility];  Service: General;  Laterality: N/A;   IR ANGIO EXTERNAL CAROTID SEL EXT CAROTID UNI L MOD SED  10/26/2017   IR ANGIO INTRA EXTRACRAN SEL COM CAROTID INNOMINATE BILAT MOD SED  10/26/2017   IR ANGIO VERTEBRAL SEL VERTEBRAL UNI R MOD SED  10/26/2017   IR ANGIOGRAM EXTREMITY LEFT  10/26/2017   IR ANGIOGRAM FOLLOW UP STUDY  10/26/2017   IR NEURO EACH ADD'L AFTER BASIC UNI LEFT (MS)  10/26/2017   IR RADIOLOGIST EVAL & MGMT  09/21/2017   IR TRANSCATH/EMBOLIZ  10/26/2017   MASS EXCISION Left 10/27/2017   Procedure: EXCISION CAROTID BODY TUMOR;  Surgeon: Christia Reading, MD;  Location: [facility] OR;  Service: ENT;  Laterality: Left;   RADIOLOGY WITH ANESTHESIA N/A 10/26/2017   Procedure: EMBOLIZATION;  Surgeon: Julieanne Cotton, MD;  Location: [facility];  Service: Radiology;  Laterality: N/A;   SIGMOIDOSCOPY N/A 08/31/2018   Procedure: SIGMOIDOSCOPY-IN  OR;  Surgeon: Earline Mayotte, MD;  Location: [facility];  Service: General;  Laterality: N/A;   UPPER GASTROINTESTINAL ENDOSCOPY  2014   VESICOVAGINAL FISTULA CLOSURE W/ TAH     Past Medical History:  Diagnosis Date   Anemia    Carotid body tumor (HCC)    left   Family history of colon cancer    Hypertension    Hypothyroid    Pyelonephritis    Rectal bleeding 11/26/2015   Vertigo    Vitamin B 12 deficiency    BP (!) 146/69   Pulse (!) 57   Ht 5\' 4"  (1.626 m)   Wt 143 lb 9.6 oz (65.1 kg)   SpO2 95%   BMI 24.65 kg/m   Opioid Risk Score:   Fall Risk Score:  `1  Depression screen [facility] 2/9  03/06/2023   10:57 AM 01/16/2023   12:29 PM 12/29/2022    8:10 AM 10/03/2022   11:26 AM 07/01/2022   10:13 AM 05/12/2022   10:51 AM 03/01/2022    1:43 PM  Depression screen PHQ 2/9  Decreased Interest 0 0 0 0 0 3 0  Down, Depressed, Hopeless 0 0 0 0 0 0 0  PHQ - 2 Score 0 0 0 0 0 3 0  Altered sleeping 0 0    0   Tired, decreased energy 0 0    0   Change in appetite 0 0    0   Feeling bad or failure about yourself   0 0    0   Trouble concentrating 0 0    0   Moving slowly or fidgety/restless 0 0    0   Suicidal thoughts 0 0    0   PHQ-9 Score 0 0    3   Difficult doing work/chores Not difficult at all Not difficult at all          Review of Systems  All other systems reviewed and are negative.     Objective:   Physical Exam    Gen: no distress, normal appearing HEENT: oral mucosa pink and moist, NCAT Chest: normal effort, normal rate of breathing Abd: soft, non-distended Ext: no edema Psych: very pleasant, normal affect Skin: intact Neuro: Alert and oriented , follows commands, answers questions,  No significant motor or sensory deficits noted  Musculoskeletal:  Slump test negative b/l Minimal lumbar paraspinal tenderness Gait with normal step length and cadence, does not appear antalgic   L spine MRI 02/18/22 Vertebrae: Marrow space lesions affecting T12, L1, L4 and L5, most extensive at L4. I think these most likely represent atypical hemangiomas. However, the possibility of osseous metastatic disease is not completely ruled out.   Conus medullaris and cauda equina: Conus extends to the L1 level. Conus and cauda equina appear normal.   Paraspinal and other soft tissues: Negative   Disc levels:   T11-12 through L1-2: Normal   L2-3: Minimal disc bulge.  Minimal facet hypertrophy.  No stenosis.   L3-4: Mild bulging of the disc. Mild facet hypertrophy. No stenosis.   L4-5: Mild bulging of the disc. Mild facet and ligamentous hypertrophy. No compressive stenosis.   L5-S1: Shallow protrusion of the disc. Mild indentation of the thecal sac. Mild narrowing of the lateral recesses and foramina. Foraminal encroachment is more pronounced on the right, where the right L5 nerve could be affected.   IMPRESSION: L5-S1: Shallow disc protrusion. Mild indentation of the thecal sac with mild stenosis of the subarticular lateral recesses and neural foramina. Foraminal encroachment is  more pronounced on the right, where the right L5 nerve could be irritated.   Mild, non-compressive degenerative changes from L2-3 through L4-5 as described above.   Marrow space signal abnormalities at T12, L1, L4 and L5. I think these probably represent atypical hemangiomas. The possibility of metastatic disease does exist but is not favored. Does the patient have any known malignancy?     Assessment & Plan:   Chronic lower back pain with right radiculopathy likely L5.  Lumbar spondylosis with MRI with L5-S1 disc protrusion with mild indentation of thecal sac and foraminal narrowing. -Gabapentin stopped previously due to sedation -Continue duloxetine 30 mg, will continue  however could consider discontinuing and seeing how she does since her pain is much improved -Continue Robaxin 750 mg as needed -Patient has  a TENS unit, does not use it frequently because pain is currently well-controlled    History of paraganglioma followed by oncology.  -Oncology referral placed prior visit, oncology note 05/12/22 reports they reviewed  recent MRI and lesions consistent with hemangiomas, no further workup recommended at this time    Follow-up as needed

## 2023-03-22 ENCOUNTER — Encounter: Payer: Self-pay | Admitting: Physician Assistant

## 2023-03-22 ENCOUNTER — Ambulatory Visit (INDEPENDENT_AMBULATORY_CARE_PROVIDER_SITE_OTHER): Payer: Medicare PPO | Admitting: Physician Assistant

## 2023-03-22 VITALS — BP 124/70 | HR 59 | Temp 98.2°F | Ht 64.0 in | Wt 144.0 lb

## 2023-03-22 DIAGNOSIS — K219 Gastro-esophageal reflux disease without esophagitis: Secondary | ICD-10-CM | POA: Diagnosis not present

## 2023-03-22 DIAGNOSIS — I10 Essential (primary) hypertension: Secondary | ICD-10-CM | POA: Diagnosis not present

## 2023-03-22 DIAGNOSIS — R053 Chronic cough: Secondary | ICD-10-CM

## 2023-03-22 MED ORDER — FAMOTIDINE 20 MG PO TABS
20.0000 mg | ORAL_TABLET | Freq: Two times a day (BID) | ORAL | 3 refills | Status: DC
Start: 2023-03-22 — End: 2023-04-25

## 2023-03-22 MED ORDER — PANTOPRAZOLE SODIUM 40 MG PO TBEC
40.0000 mg | DELAYED_RELEASE_TABLET | Freq: Every day | ORAL | 3 refills | Status: DC
Start: 2023-03-22 — End: 2023-04-25

## 2023-03-22 NOTE — Patient Instructions (Signed)
Referral sent to [facility]. Someone from their office will call to schedule an office visit.

## 2023-03-28 ENCOUNTER — Ambulatory Visit: Payer: Medicare PPO | Admitting: Physical Medicine & Rehabilitation

## 2023-03-31 ENCOUNTER — Ambulatory Visit: Payer: Medicare PPO | Admitting: Physical Medicine & Rehabilitation

## 2023-04-03 ENCOUNTER — Ambulatory Visit (HOSPITAL_COMMUNITY)
Admission: RE | Admit: 2023-04-03 | Discharge: 2023-04-03 | Disposition: A | Discharge status: Home / Self Care | Payer: Medicare PPO | Source: Ambulatory Visit | Attending: Hematology | Admitting: Hematology

## 2023-04-03 ENCOUNTER — Encounter (HOSPITAL_COMMUNITY): Payer: Self-pay | Admitting: Radiology

## 2023-04-03 ENCOUNTER — Inpatient Hospital Stay: Payer: Medicare PPO | Attending: Hematology

## 2023-04-03 DIAGNOSIS — D446 Neoplasm of uncertain behavior of carotid body: Secondary | ICD-10-CM | POA: Insufficient documentation

## 2023-04-03 DIAGNOSIS — Z800 Family history of malignant neoplasm of digestive organs: Secondary | ICD-10-CM | POA: Insufficient documentation

## 2023-04-03 DIAGNOSIS — D447 Neoplasm of uncertain behavior of aortic body and other paraganglia: Secondary | ICD-10-CM | POA: Insufficient documentation

## 2023-04-03 DIAGNOSIS — I6523 Occlusion and stenosis of bilateral carotid arteries: Secondary | ICD-10-CM | POA: Diagnosis not present

## 2023-04-03 DIAGNOSIS — M47812 Spondylosis without myelopathy or radiculopathy, cervical region: Secondary | ICD-10-CM | POA: Diagnosis not present

## 2023-04-03 DIAGNOSIS — N189 Chronic kidney disease, unspecified: Secondary | ICD-10-CM | POA: Insufficient documentation

## 2023-04-03 DIAGNOSIS — M47816 Spondylosis without myelopathy or radiculopathy, lumbar region: Secondary | ICD-10-CM | POA: Diagnosis not present

## 2023-04-03 LAB — CBC WITH DIFFERENTIAL/PLATELET
Abs Immature Granulocytes: 0.01 10*3/uL (ref 0.00–0.07)
Basophils Absolute: 0 10*3/uL (ref 0.0–0.1)
Basophils Relative: 0 %
Eosinophils Absolute: 0.2 10*3/uL (ref 0.0–0.5)
Eosinophils Relative: 3 %
HCT: 38 % (ref 36.0–46.0)
Hemoglobin: 11.7 g/dL — ABNORMAL LOW (ref 12.0–15.0)
Immature Granulocytes: 0 %
Lymphocytes Relative: 33 %
Lymphs Abs: 2 10*3/uL (ref 0.7–4.0)
MCH: 29.1 pg (ref 26.0–34.0)
MCHC: 30.8 g/dL (ref 30.0–36.0)
MCV: 94.5 fL (ref 80.0–100.0)
Monocytes Absolute: 0.6 10*3/uL (ref 0.1–1.0)
Monocytes Relative: 10 %
Neutro Abs: 3.2 10*3/uL (ref 1.7–7.7)
Neutrophils Relative %: 54 %
Platelets: 246 10*3/uL (ref 150–400)
RBC: 4.02 MIL/uL (ref 3.87–5.11)
RDW: 13.9 % (ref 11.5–15.5)
WBC: 6 10*3/uL (ref 4.0–10.5)
nRBC: 0 % (ref 0.0–0.2)

## 2023-04-03 MED ORDER — IOHEXOL 300 MG/ML  SOLN
75.0000 mL | Freq: Once | INTRAMUSCULAR | Status: AC | PRN
  Administered 2023-04-03: 75 mL via INTRAVENOUS

## 2023-04-10 NOTE — Progress Notes (Signed)
[facility] [address] [place] [address]    Clinic Day:  04/11/2023  Referring physician: Gweneth Dimitri, MD  Patient Care Team: Mort Sawyers, FNP as PCP - General (Family Medicine) Julieanne Cotton, MD as Consulting Physician (Interventional Radiology)   ASSESSMENT & PLAN:   Assessment: 1.  Paraganglioma of the left carotid artery: -Left carotid body biopsy on 10/27/2017 shows paraganglioma.  Status was not mentioned. -Work-up with 24-hour urine metanephrines and normetanephrine's were within normal limits. -Genetic testing for SDH and BHL was negative.  It showed VUS in KIT and STK11. -CT soft tissue neck on 03/27/2020 did not show any evidence of recurrence or residual carotid body tumor.  Plan: 1.  Paraganglioma of the left carotid artery: -Physical examination was benign.  She does not have any new complaints. - Reviewed labs from 04/06/2022 which showed normal CBC.  TSH was normal. - CT soft tissue neck (04/06/2022): Stable without evidence of recurrence. - She was previously recommended follow-up in 1 year with repeat CT scan for 1 more last time.   2.  CKD: - Latest creatinine is 1.21 and stable.   3.  Spine lesions: - MRI of the lumbar spine without contrast on 02/18/2022: Marrow space signal abnormalities at T12, L1, L4 and L5?  Probably representing atypical hemangiomas.  Possibility of metastatic disease cannot be ruled out. - Recommend MRI of the lumbar spine with and without contrast.  Recommend phone visit after that.  No orders of the defined types were placed in this encounter.     Alben Deeds Teague,acting as a Neurosurgeon for Doreatha Massed, MD.,have documented all relevant documentation on the behalf of Doreatha Massed, MD,as directed by  Doreatha Massed, MD while in the presence of Doreatha Massed, MD.   ***  Corydon R Teague   7/22/202412:55 PM  CHIEF COMPLAINT:   Diagnosis: paraganglioma   Cancer Staging  No  matching staging information was found for the patient.    Prior Therapy: Excision of left carotid body tumor on 10/27/2017   Current Therapy:  surveillance   HISTORY OF PRESENT ILLNESS:   Oncology History   No history exists.     INTERVAL HISTORY:   Heather Cobb is a 82 y.o. female presenting to clinic today for follow up of paraganglioma. She was last seen by me on 05/16/22.  Since her last visit, she underwent a CT of the soft tissue neck on 7/15 that found  Today, she states that she is doing well overall. Her appetite level is at ***%. Her energy level is at ***%.  PAST MEDICAL HISTORY:   Past Medical History: Past Medical History:  Diagnosis Date   Anemia    Carotid body tumor (HCC)    left   Family history of colon cancer    Hypertension    Hypothyroid    Pyelonephritis    Rectal bleeding 11/26/2015   Vertigo    Vitamin B 12 deficiency     Surgical History: Past Surgical History:  Procedure Laterality Date   ABDOMINAL HYSTERECTOMY     CATARACT EXTRACTION W/ INTRAOCULAR LENS  IMPLANT, BILATERAL     CHOLECYSTECTOMY N/A 04/15/2015   Procedure: LAPAROSCOPIC CHOLECYSTECTOMY;  Surgeon: Franky Macho, MD;  Location: [facility];  Service: General;  Laterality: N/A;   COLONOSCOPY  2012   [place]   COLONOSCOPY WITH PROPOFOL N/A 02/03/2016   Procedure: COLONOSCOPY WITH PROPOFOL;  Surgeon: Earline Mayotte, MD;  Location: [facility];  Service: Endoscopy;  Laterality: N/A;  EYE SURGERY Bilateral    cataract extractions   HEMORRHOID SURGERY N/A 08/31/2018   Procedure: HEMORRHOIDECTOMY;  Surgeon: Earline Mayotte, MD;  Location: [facility];  Service: General;  Laterality: N/A;   IR ANGIO EXTERNAL CAROTID SEL EXT CAROTID UNI L MOD SED  10/26/2017   IR ANGIO INTRA EXTRACRAN SEL COM CAROTID INNOMINATE BILAT MOD SED  10/26/2017   IR ANGIO VERTEBRAL SEL VERTEBRAL UNI R MOD SED  10/26/2017   IR ANGIOGRAM EXTREMITY LEFT  10/26/2017   IR ANGIOGRAM FOLLOW UP STUDY  10/26/2017   IR NEURO EACH  ADD'L AFTER BASIC UNI LEFT (MS)  10/26/2017   IR RADIOLOGIST EVAL & MGMT  09/21/2017   IR TRANSCATH/EMBOLIZ  10/26/2017   MASS EXCISION Left 10/27/2017   Procedure: EXCISION CAROTID BODY TUMOR;  Surgeon: Christia Reading, MD;  Location: [facility];  Service: ENT;  Laterality: Left;   RADIOLOGY WITH ANESTHESIA N/A 10/26/2017   Procedure: EMBOLIZATION;  Surgeon: Julieanne Cotton, MD;  Location: [facility] OR;  Service: Radiology;  Laterality: N/A;   SIGMOIDOSCOPY N/A 08/31/2018   Procedure: SIGMOIDOSCOPY-IN  OR;  Surgeon: Earline Mayotte, MD;  Location: [facility];  Service: General;  Laterality: N/A;   UPPER GASTROINTESTINAL ENDOSCOPY  2014   VESICOVAGINAL FISTULA CLOSURE W/ TAH      Social History: Social History   Socioeconomic History   Marital status: Widowed    Spouse name: Not on file   Number of children: 1   Years of education: high school   Highest education level: 12th grade  Occupational History   Occupation: Scientist, product/process development    Comment: retired  Tobacco Use   Smoking status: Never   Smokeless tobacco: Never  Vaping Use   Vaping status: Never Used  Substance and Sexual Activity   Alcohol use: No    Alcohol/week: 0.0 standard drinks of alcohol   Drug use: No   Sexual activity: Not Currently    Birth control/protection: Surgical  Other Topics Concern   Not on file  Social History Narrative   10/15/19   From: the area   Living: with Daugher [name]) and son-in-law   Work: retired from [employer] work, 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      Family: lives with daughter, has 1 grandson and 3 great-grandchildren and 1 great-great-grandchild      Enjoys: eat, dance, travel, cooking      Exercise: walk - not as much in cold, will do 1 mile/day   Diet: good, tries to eat health      Safety   Seat belts: Yes    Guns: Yes  and secure   Safe in relationships: Yes    Social Determinants of Health   Financial Resource Strain: Low Risk  (03/06/2023)   Overall Financial Resource Strain (CARDIA)     Difficulty of Paying Living Expenses: Not hard at all  Food Insecurity: No Food Insecurity (03/06/2023)   Hunger Vital Sign    Worried About Running Out of Food in the Last Year: Never true    Ran Out of Food in the Last Year: Never true  Transportation Needs: No Transportation Needs (03/06/2023)   PRAPARE - Administrator, Civil Service (Medical): No    Lack of Transportation (Non-Medical): No  Physical Activity: Insufficiently Active (03/06/2023)   Exercise Vital Sign    Days of Exercise per Week: 1 day    Minutes of Exercise per Session: 30 min  Stress: No Stress Concern Present (03/06/2023)  [employer] of Occupational Health - Occupational Stress Questionnaire    Feeling of Stress : Not at all  Social Connections: Moderately Integrated (03/06/2023)   Social Connection and Isolation Panel [NHANES]    Frequency of Communication with Friends and Family: More than three times a week    Frequency of Social Gatherings with Friends and Family: More than three times a week    Attends Religious Services: More than 4 times per year    Active Member of [employer] or Organizations: Yes    Attends Banker Meetings: More than 4 times per year    Marital Status: Widowed  Intimate Partner Violence: Not At Risk (03/06/2023)   Humiliation, Afraid, Rape, and Kick questionnaire    Fear of Current or Ex-Partner: No    Emotionally Abused: No    Physically Abused: No    Sexually Abused: No    Family History: Family History  Problem Relation Age of Onset   Asthma Mother    Kidney failure Mother    Hypertension Daughter    Colon cancer Father        dx in his 42s   Stomach cancer Sister        stage 4   Colon cancer Maternal Uncle    Heart attack Maternal Grandmother    Asthma Brother    Cancer Neg Hx     Current Medications:  Current Outpatient Medications:    albuterol (VENTOLIN HFA) 108 (90 Base) MCG/ACT inhaler, Inhale 2 puffs into the lungs every 6 (six) hours  as needed for wheezing or shortness of breath., Disp: 18 g, Rfl: 11   amLODipine (NORVASC) 5 MG tablet, Take 1 tablet (5 mg total) by mouth 2 (two) times daily., Disp: 180 tablet, Rfl: 3   aspirin EC 81 MG tablet, Take 81 mg by mouth daily., Disp: , Rfl:    benzonatate (TESSALON) 200 MG capsule, Take 1 capsule (200 mg total) by mouth 3 (three) times daily as needed for cough., Disp: 30 capsule, Rfl: 0   cholecalciferol (VITAMIN D) 1000 UNITS tablet, Take 1,000 Units by mouth daily., Disp: , Rfl:    clotrimazole-betamethasone (LOTRISONE) cream, APPLY TOPICALLY TO THE AFFECTED AREA TWICE DAILY, Disp: 30 g, Rfl: 2   DULoxetine (CYMBALTA) 30 MG capsule, Take 1 capsule (30 mg total) by mouth daily., Disp: 90 capsule, Rfl: 3   famotidine (PEPCID) 20 MG tablet, Take 1 tablet (20 mg total) by mouth 2 (two) times daily., Disp: 180 tablet, Rfl: 3   ferrous sulfate 325 (65 FE) MG tablet, Take 325 mg by mouth daily with breakfast., Disp: , Rfl:    fluticasone (FLONASE) 50 MCG/ACT nasal spray, Place 2 sprays into both nostrils daily., Disp: 16 g, Rfl: 6   levothyroxine (SYNTHROID) 75 MCG tablet, Take 1 tablet (75 mcg total) by mouth daily before breakfast., Disp: 90 tablet, Rfl: 1   linaclotide (LINZESS) 145 MCG CAPS capsule, Take 1 capsule (145 mcg total) by mouth daily as needed (for constipation)., Disp: 90 capsule, Rfl: 3   loratadine (CLARITIN) 10 MG tablet, Take 1 tablet (10 mg total) by mouth daily., Disp: 90 tablet, Rfl: 3   methocarbamol (ROBAXIN-750) 750 MG tablet, Take 1 tablet (750 mg total) by mouth every 12 (twelve) hours as needed for muscle spasms., Disp: 60 tablet, Rfl: 3   olmesartan (BENICAR) 40 MG tablet, TAKE 1 TABLET(40 MG) BY MOUTH DAILY, Disp: 90 tablet, Rfl: 3   pantoprazole (PROTONIX) 40 MG tablet, Take 1 tablet (40 mg  total) by mouth daily., Disp: 90 tablet, Rfl: 3   spironolactone (ALDACTONE) 25 MG tablet, Take 1 tablet (25 mg total) by mouth daily. For blood pressure., Disp: 90  tablet, Rfl: 3   vitamin B-12 (CYANOCOBALAMIN) 1000 MCG tablet, Take 1,000 mcg by mouth daily. , Disp: , Rfl:    Allergies: Allergies  Allergen Reactions   Codeine Nausea Only and Other (See Comments)    fever fever fever    REVIEW OF SYSTEMS:   Review of Systems  Constitutional:  Negative for chills, fatigue and fever.  HENT:   Negative for lump/mass, mouth sores, nosebleeds, sore throat and trouble swallowing.   Eyes:  Negative for eye problems.  Respiratory:  Negative for cough and shortness of breath.   Cardiovascular:  Negative for chest pain, leg swelling and palpitations.  Gastrointestinal:  Negative for abdominal pain, constipation, diarrhea, nausea and vomiting.  Genitourinary:  Negative for bladder incontinence, difficulty urinating, dysuria, frequency, hematuria and nocturia.   Musculoskeletal:  Negative for arthralgias, back pain, flank pain, myalgias and neck pain.  Skin:  Negative for itching and rash.  Neurological:  Negative for dizziness, headaches and numbness.  Hematological:  Does not bruise/bleed easily.  Psychiatric/Behavioral:  Negative for depression, sleep disturbance and suicidal ideas. The patient is not nervous/anxious.   All other systems reviewed and are negative.    VITALS:   There were no vitals taken for this visit.  Wt Readings from Last 3 Encounters:  03/22/23 144 lb (65.3 kg)  03/21/23 143 lb 9.6 oz (65.1 kg)  03/06/23 146 lb (66.2 kg)    There is no height or weight on file to calculate BMI.  Performance status (ECOG): {CHL ONC [id number]  PHYSICAL EXAM:   Physical Exam Vitals and nursing note reviewed. Exam conducted with a chaperone present.  Constitutional:      Appearance: Normal appearance.  Cardiovascular:     Rate and Rhythm: Normal rate and regular rhythm.     Pulses: Normal pulses.     Heart sounds: Normal heart sounds.  Pulmonary:     Effort: Pulmonary effort is normal.     Breath sounds: Normal breath sounds.   Abdominal:     Palpations: Abdomen is soft. There is no hepatomegaly, splenomegaly or mass.     Tenderness: There is no abdominal tenderness.  Musculoskeletal:     Right lower leg: No edema.     Left lower leg: No edema.  Lymphadenopathy:     Cervical: No cervical adenopathy.     Right cervical: No superficial, deep or posterior cervical adenopathy.    Left cervical: No superficial, deep or posterior cervical adenopathy.     Upper Body:     Right upper body: No supraclavicular or axillary adenopathy.     Left upper body: No supraclavicular or axillary adenopathy.  Neurological:     General: No focal deficit present.     Mental Status: She is alert and oriented to person, place, and time.  Psychiatric:        Mood and Affect: Mood normal.        Behavior: Behavior normal.     LABS:      Latest Ref Rng & Units 04/03/2023    9:39 AM 09/29/2022   10:04 AM 04/06/2022    8:56 AM  CBC  WBC 4.0 - 10.5 K/uL 6.0  5.0  4.8   Hemoglobin 12.0 - 15.0 g/dL 62.1  30.8  65.7   Hematocrit 36.0 - 46.0 % 38.0  38.2  37.1   Platelets 150 - 400 K/uL 246  244.0  219       Latest Ref Rng & Units 09/29/2022   10:04 AM 02/23/2022    1:52 PM 03/31/2021    8:55 AM  CMP  Glucose 70 - 99 mg/dL 95  409  811   BUN 6 - 23 mg/dL 16  16  16    Creatinine 0.40 - 1.20 mg/dL 9.14  7.82  9.56   Sodium 135 - 145 mEq/L 145  141  142   Potassium 3.5 - 5.1 mEq/L 3.7  3.8  3.7   Chloride 96 - 112 mEq/L 105  104  102   CO2 19 - 32 mEq/L 33  29  30   Calcium 8.4 - 10.5 mg/dL 21.3  08.6  57.8   Total Protein 6.0 - 8.3 g/dL 6.9   7.6   Total Bilirubin 0.2 - 1.2 mg/dL 0.6   0.9   Alkaline Phos 39 - 117 U/L 73   66   AST 0 - 37 U/L 12   16   ALT 0 - 35 U/L 7   13      No results found for: "CEA1", "CEA" / No results found for: "CEA1", "CEA" No results found for: "PSA1" No results found for: "ION629" No results found for: "CAN125"  No results found for: "TOTALPROTELP", "ALBUMINELP", "A1GS", "A2GS", "BETS",  "BETA2SER", "GAMS", "MSPIKE", "SPEI" Lab Results  Component Value Date   TIBC 238 (L) 02/02/2013   FERRITIN 121 02/02/2013   IRONPCTSAT 6 (L) 02/02/2013   Lab Results  Component Value Date   LDH 109 (L) 06/21/2022     STUDIES:   No results found.

## 2023-04-11 ENCOUNTER — Inpatient Hospital Stay (HOSPITAL_BASED_OUTPATIENT_CLINIC_OR_DEPARTMENT_OTHER): Payer: Medicare PPO | Admitting: Hematology

## 2023-04-11 VITALS — BP 106/63 | HR 66 | Temp 98.9°F | Resp 16 | Wt 141.6 lb

## 2023-04-11 DIAGNOSIS — Z800 Family history of malignant neoplasm of digestive organs: Secondary | ICD-10-CM | POA: Diagnosis not present

## 2023-04-11 DIAGNOSIS — N189 Chronic kidney disease, unspecified: Secondary | ICD-10-CM | POA: Diagnosis not present

## 2023-04-11 DIAGNOSIS — D447 Neoplasm of uncertain behavior of aortic body and other paraganglia: Secondary | ICD-10-CM

## 2023-04-11 NOTE — Patient Instructions (Signed)
[facility] at [facility] Discharge Instructions   You were seen and examined today by Dr. Ellin Saba.  He reviewed the results of your lab work and scan which are normal.  Return as scheduled in 1 year.    Thank you for choosing [facility] at [facility] to provide your oncology and hematology care.  To afford each patient quality time with our provider, please arrive at least 15 minutes before your scheduled appointment time.   If you have a lab appointment with the Cancer Center please come in thru the Main Entrance and check in at the main information desk.  You need to re-schedule your appointment should you arrive 10 or more minutes late.  We strive to give you quality time with our providers, and arriving late affects you and other patients whose appointments are after yours.  Also, if you no show three or more times for appointments you may be dismissed from the clinic at the providers discretion.     Again, thank you for choosing [facility].  Our hope is that these requests will decrease the amount of time that you wait before being seen by our physicians.       _____________________________________________________________  Should you have questions after your visit to [facility], please contact our office at [phone] and follow the prompts.  Our office hours are 8:00 a.m. and 4:30 p.m. Monday - Friday.  Please note that voicemails left after 4:00 p.m. may not be returned until the following business day.  We are closed weekends and major holidays.  You do have access to a nurse 24-7, just call the main number to the clinic [phone] and do not press any options, hold on the line and a nurse will answer the phone.    For prescription refill requests, have your pharmacy contact our office and allow 72 hours.    Due to Covid, you will need to wear a mask upon entering the hospital. If you do not have a  mask, a mask will be given to you at the Main Entrance upon arrival. For doctor visits, patients may have 1 support person age 59 or older with them. For treatment visits, patients can not have anyone with them due to social distancing guidelines and our immunocompromised population.

## 2023-04-13 DIAGNOSIS — M47816 Spondylosis without myelopathy or radiculopathy, lumbar region: Secondary | ICD-10-CM | POA: Diagnosis not present

## 2023-04-19 ENCOUNTER — Other Ambulatory Visit: Payer: Self-pay

## 2023-04-19 ENCOUNTER — Encounter: Payer: Self-pay | Admitting: Family

## 2023-04-19 DIAGNOSIS — E039 Hypothyroidism, unspecified: Secondary | ICD-10-CM

## 2023-04-19 MED ORDER — LEVOTHYROXINE SODIUM 75 MCG PO TABS
75.0000 ug | ORAL_TABLET | Freq: Every day | ORAL | 1 refills | Status: DC
Start: 2023-04-19 — End: 2023-11-08

## 2023-04-19 MED ORDER — LEVOTHYROXINE SODIUM 75 MCG PO TABS
75.0000 ug | ORAL_TABLET | Freq: Every day | ORAL | 1 refills | Status: DC
Start: 2023-04-19 — End: 2023-04-19

## 2023-04-25 ENCOUNTER — Ambulatory Visit (INDEPENDENT_AMBULATORY_CARE_PROVIDER_SITE_OTHER): Payer: Medicare PPO | Admitting: Pulmonary Disease

## 2023-04-25 ENCOUNTER — Encounter: Payer: Self-pay | Admitting: Pulmonary Disease

## 2023-04-25 VITALS — BP 130/80 | HR 58 | Temp 97.8°F | Ht 64.5 in | Wt 143.0 lb

## 2023-04-25 DIAGNOSIS — R053 Chronic cough: Secondary | ICD-10-CM | POA: Diagnosis not present

## 2023-04-25 DIAGNOSIS — J45909 Unspecified asthma, uncomplicated: Secondary | ICD-10-CM | POA: Diagnosis not present

## 2023-04-25 MED ORDER — PANTOPRAZOLE SODIUM 40 MG PO TBEC
40.0000 mg | DELAYED_RELEASE_TABLET | Freq: Every day | ORAL | 1 refills | Status: DC
Start: 2023-04-25 — End: 2023-08-03

## 2023-04-25 MED ORDER — BUDESONIDE-FORMOTEROL FUMARATE 80-4.5 MCG/ACT IN AERO
2.0000 | INHALATION_SPRAY | Freq: Two times a day (BID) | RESPIRATORY_TRACT | 12 refills | Status: DC
Start: 2023-04-25 — End: 2023-08-16

## 2023-04-25 NOTE — Progress Notes (Unsigned)
Synopsis: Referred in by Celso Amy, PA-C   Subjective:   PATIENT ID: Heather Cobb GENDER: female DOB: 09-Jul-1941, MRN: [id number]  Chief Complaint  Patient presents with   Follow-up    Dry cough at times prod with clear sputum x4-5y    HPI Heather Cobb is a pleasant 82 y.o female patient with a past medical history of hypertension and hypothyroidism presenting today to the pulmonary clinic for follow up on chronic cough.   She saw Dr. Celine Mans initially and was managed for GERD and Intermittent asthma she then saw Kandice Robinsons on 04/12/2021. She is coming back now with persistent cough, reports it is dry and does not seem to identify a precipitating factor. She does report some improvement with albuterol inhaler. She denies any heart burn or acid taste in the mouth. She denies any post nasal drip.   FH: Mother and siblings with asthma.   SH: Never smoker, denies alcohol use. No pets at home.   ROS All systems were reviewed and are negative except for the above.  Objective:   Vitals:   04/25/23 1129  BP: 130/80  Pulse: (!) 58  Temp: 97.8 F (36.6 C)  TempSrc: Temporal  SpO2: 96%  Weight: 143 lb (64.9 kg)  Height: 5' 4.5" (1.638 m)   96% on RA BMI Readings from Last 3 Encounters:  04/25/23 24.17 kg/m  04/11/23 24.31 kg/m  03/22/23 24.72 kg/m   Wt Readings from Last 3 Encounters:  04/25/23 143 lb (64.9 kg)  04/11/23 141 lb 9.6 oz (64.2 kg)  03/22/23 144 lb (65.3 kg)    Physical Exam GEN: NAD, Healthy Appearing HEENT: Supple Neck, Reactive Pupils, EOMI  CVS: Normal S1, Normal S2, RRR, No murmurs or ES appreciated  Lungs: Clear bilateral air entry.  Abdomen: Soft, non tender, non distended, + BS  Extremities: Warm and well perfused, No edema  Skin: No suspicious lesions appreciated  Psych: Normal Affect  Ancillary Information   CBC    Component Value Date/Time   WBC 6.0 04/03/2023 0939   RBC 4.02 04/03/2023 0939   HGB 11.7 (L) 04/03/2023 0939   HCT  38.0 04/03/2023 0939   PLT 246 04/03/2023 0939   MCV 94.5 04/03/2023 0939   MCH 29.1 04/03/2023 0939   MCHC 30.8 04/03/2023 0939   RDW 13.9 04/03/2023 0939   LYMPHSABS 2.0 04/03/2023 0939   MONOABS 0.6 04/03/2023 0939   EOSABS 0.2 04/03/2023 0939   BASOSABS 0.0 04/03/2023 0939    Imaging  CXR 10/23:  No active cardiopulmonary disease   CT Soft tissue neck 2024: Pharynx and larynx: No focal mucosal or submucosal lesions present. Focal calcification in the nasopharynx is again noted. The nasopharynx is otherwise clear. The soft palate and tongue base are within normal limits. Oropharynx is unremarkable. Parapharyngeal fat is clear bilaterally. Vallecula and epiglottis are within normal limits. Aryepiglottic folds and piriform sinuses are clear. Vocal cords are midline and symmetric. Trachea is clear.      No data to display           Assessment & Plan:  Heather Cobb is a pleasant 82 y.o female patient with a past medical history of hypertension and hypothyroidism presenting today to the pulmonary clinic for follow up on chronic cough.   #Chronic cough with DDX multifactorial including Cough variant asthma and GERD. No medications on her list that could cause chronic cough.   []  PFTs  []  Start budesonide-formoterol [Symbicort]  80-4.5 mcg 2 puffs BID.  []   Pantoprazole 40mg  1 tab PO daily  Return in about 3 months (around 07/26/2023).  I spent 60 minutes caring for this patient today, including preparing to see the patient, obtaining a medical history , reviewing a separately obtained history, performing a medically appropriate examination and/or evaluation, counseling and educating the patient/family/caregiver, ordering medications, tests, or procedures, referring and communicating with other health care professionals (not separately reported), and documenting clinical information in the electronic health record  Janann Colonel, MD [facility] 04/25/2023  11:54 AM

## 2023-04-28 ENCOUNTER — Encounter: Payer: Self-pay | Admitting: Family

## 2023-04-28 NOTE — Telephone Encounter (Signed)
Called patient offered her appointment today in [place] office. She declined. I have set up for Monday morning in our office with Dr. Alphonsus Sias. Reviewed all red words if any she will get seen at urgent care or ED over the weekend.

## 2023-05-01 ENCOUNTER — Encounter: Payer: Self-pay | Admitting: Internal Medicine

## 2023-05-01 ENCOUNTER — Ambulatory Visit (INDEPENDENT_AMBULATORY_CARE_PROVIDER_SITE_OTHER): Payer: Medicare PPO | Admitting: Internal Medicine

## 2023-05-01 VITALS — BP 122/70 | HR 63 | Temp 97.2°F | Ht 64.5 in | Wt 143.0 lb

## 2023-05-01 DIAGNOSIS — N3000 Acute cystitis without hematuria: Secondary | ICD-10-CM | POA: Diagnosis not present

## 2023-05-01 DIAGNOSIS — R350 Frequency of micturition: Secondary | ICD-10-CM

## 2023-05-01 LAB — POC URINALSYSI DIPSTICK (AUTOMATED)
Bilirubin, UA: NEGATIVE
Glucose, UA: NEGATIVE
Ketones, UA: NEGATIVE
Nitrite, UA: NEGATIVE
Protein, UA: NEGATIVE
Spec Grav, UA: >=1.03 — AB (ref 1.010–1.025)
Urobilinogen, UA: 0.2 E.U./dL
pH, UA: 6 (ref 5.0–8.0)

## 2023-05-01 MED ORDER — SULFAMETHOXAZOLE-TRIMETHOPRIM 800-160 MG PO TABS
1.0000 | ORAL_TABLET | Freq: Two times a day (BID) | ORAL | 1 refills | Status: DC
Start: 2023-05-01 — End: 2023-08-16

## 2023-05-01 NOTE — Addendum Note (Signed)
Addended by: Eual Fines on: 05/01/2023 09:51 AM   Modules accepted: Orders

## 2023-05-01 NOTE — Telephone Encounter (Signed)
Noted  

## 2023-05-01 NOTE — Progress Notes (Signed)
Subjective:    Patient ID: Heather Cobb, female    DOB: [birth year], 82 y.o.   MRN: [id number]  HPI Here with daughter due to urinary symptoms  "I have a UTI" Has been peeing a lot--and odor Started about 2 weeks ago---has had periodic infection No dysuria or blood Not necessarily drinking enough water---but lots of other fluids Does have cranberry juice regularly No fever No urgency  Current Outpatient Medications on File Prior to Visit  Medication Sig Dispense Refill   albuterol (VENTOLIN HFA) 108 (90 Base) MCG/ACT inhaler Inhale 2 puffs into the lungs every 6 (six) hours as needed for wheezing or shortness of breath. 18 g 11   amLODipine (NORVASC) 5 MG tablet Take 1 tablet (5 mg total) by mouth 2 (two) times daily. 180 tablet 3   aspirin EC 81 MG tablet Take 81 mg by mouth daily.     budesonide-formoterol (SYMBICORT) 80-4.5 MCG/ACT inhaler Inhale 2 puffs into the lungs in the morning and at bedtime. 1 each 12   cholecalciferol (VITAMIN D) 1000 UNITS tablet Take 1,000 Units by mouth daily.     clotrimazole-betamethasone (LOTRISONE) cream APPLY TOPICALLY TO THE AFFECTED AREA TWICE DAILY 30 g 2   DULoxetine (CYMBALTA) 30 MG capsule Take 1 capsule (30 mg total) by mouth daily. 90 capsule 3   ferrous sulfate 325 (65 FE) MG tablet Take 325 mg by mouth daily with breakfast.     fluticasone (FLONASE) 50 MCG/ACT nasal spray Place 2 sprays into both nostrils daily. 16 g 6   levothyroxine (SYNTHROID) 75 MCG tablet Take 1 tablet (75 mcg total) by mouth daily before breakfast. 90 tablet 1   linaclotide (LINZESS) 145 MCG CAPS capsule Take 1 capsule (145 mcg total) by mouth daily as needed (for constipation). 90 capsule 3   loratadine (CLARITIN) 10 MG tablet Take 1 tablet (10 mg total) by mouth daily. 90 tablet 3   methocarbamol (ROBAXIN-750) 750 MG tablet Take 1 tablet (750 mg total) by mouth every 12 (twelve) hours as needed for muscle spasms. 60 tablet 3   olmesartan (BENICAR) 40 MG tablet  TAKE 1 TABLET(40 MG) BY MOUTH DAILY 90 tablet 3   pantoprazole (PROTONIX) 40 MG tablet Take 1 tablet (40 mg total) by mouth daily. 30 tablet 1   spironolactone (ALDACTONE) 25 MG tablet Take 1 tablet (25 mg total) by mouth daily. For blood pressure. 90 tablet 3   vitamin B-12 (CYANOCOBALAMIN) 1000 MCG tablet Take 1,000 mcg by mouth daily.      No current facility-administered medications on file prior to visit.    Allergies  Allergen Reactions   Codeine Nausea Only and Other (See Comments)    fever fever fever    Past Medical History:  Diagnosis Date   Anemia    Carotid body tumor (HCC)    left   Family history of colon cancer    Hypertension    Hypothyroid    Pyelonephritis    Rectal bleeding 11/26/2015   Vertigo    Vitamin B 12 deficiency     Past Surgical History:  Procedure Laterality Date   ABDOMINAL HYSTERECTOMY     CATARACT EXTRACTION W/ INTRAOCULAR LENS  IMPLANT, BILATERAL     CHOLECYSTECTOMY N/A 04/15/2015   Procedure: LAPAROSCOPIC CHOLECYSTECTOMY;  Surgeon: Franky Macho, MD;  Location: [facility];  Service: General;  Laterality: N/A;   COLONOSCOPY  2012   [place]   COLONOSCOPY WITH PROPOFOL N/A 02/03/2016   Procedure: COLONOSCOPY WITH PROPOFOL;  Surgeon: Tinnie Gens  Brion Aliment, MD;  Location: [facility];  Service: Endoscopy;  Laterality: N/A;   EYE SURGERY Bilateral    cataract extractions   HEMORRHOID SURGERY N/A 08/31/2018   Procedure: HEMORRHOIDECTOMY;  Surgeon: Earline Mayotte, MD;  Location: [facility];  Service: General;  Laterality: N/A;   IR ANGIO EXTERNAL CAROTID SEL EXT CAROTID UNI L MOD SED  10/26/2017   IR ANGIO INTRA EXTRACRAN SEL COM CAROTID INNOMINATE BILAT MOD SED  10/26/2017   IR ANGIO VERTEBRAL SEL VERTEBRAL UNI R MOD SED  10/26/2017   IR ANGIOGRAM EXTREMITY LEFT  10/26/2017   IR ANGIOGRAM FOLLOW UP STUDY  10/26/2017   IR NEURO EACH ADD'L AFTER BASIC UNI LEFT (MS)  10/26/2017   IR RADIOLOGIST EVAL & MGMT  09/21/2017   IR TRANSCATH/EMBOLIZ  10/26/2017   MASS  EXCISION Left 10/27/2017   Procedure: EXCISION CAROTID BODY TUMOR;  Surgeon: Christia Reading, MD;  Location: [facility];  Service: ENT;  Laterality: Left;   RADIOLOGY WITH ANESTHESIA N/A 10/26/2017   Procedure: EMBOLIZATION;  Surgeon: Julieanne Cotton, MD;  Location: [facility];  Service: Radiology;  Laterality: N/A;   SIGMOIDOSCOPY N/A 08/31/2018   Procedure: SIGMOIDOSCOPY-IN  OR;  Surgeon: Earline Mayotte, MD;  Location: [facility];  Service: General;  Laterality: N/A;   UPPER GASTROINTESTINAL ENDOSCOPY  2014   VESICOVAGINAL FISTULA CLOSURE W/ TAH      Family History  Problem Relation Age of Onset   Asthma Mother    Kidney failure Mother    Hypertension Daughter    Colon cancer Father        dx in his 43s   Stomach cancer Sister        stage 4   Colon cancer Maternal Uncle    Heart attack Maternal Grandmother    Asthma Brother    Cancer Neg Hx     Social History   Socioeconomic History   Marital status: Widowed    Spouse name: Not on file   Number of children: 1   Years of education: high school   Highest education level: 12th grade  Occupational History   Occupation: Scientist, product/process development    Comment: retired  Tobacco Use   Smoking status: Never   Smokeless tobacco: Never  Vaping Use   Vaping status: Never Used  Substance and Sexual Activity   Alcohol use: No    Alcohol/week: 0.0 standard drinks of alcohol   Drug use: No   Sexual activity: Not Currently    Birth control/protection: Surgical  Other Topics Concern   Not on file  Social History Narrative   10/15/19   From: the area   Living: with Daugher Heather Cobb) and son-in-law   Work: retired from [employer] work, 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      Family: lives with daughter, has 1 grandson and 3 great-grandchildren and 1 great-great-grandchild      Enjoys: eat, dance, travel, cooking      Exercise: walk - not as much in cold, will do 1 mile/day   Diet: good, tries to eat health      Safety   Seat belts: Yes    Guns: Yes  and  secure   Safe in relationships: Yes    Social Determinants of Health   Financial Resource Strain: Low Risk  (03/06/2023)   Overall Financial Resource Strain (CARDIA)    Difficulty of Paying Living Expenses: Not hard at all  Food Insecurity: No Food Insecurity (03/06/2023)   Hunger Vital Sign  Worried About Programme researcher, broadcasting/film/video in the Last Year: Never true    Ran Out of Food in the Last Year: Never true  Transportation Needs: No Transportation Needs (03/06/2023)   PRAPARE - Administrator, Civil Service (Medical): No    Lack of Transportation (Non-Medical): No  Physical Activity: Insufficiently Active (03/06/2023)   Exercise Vital Sign    Days of Exercise per Week: 1 day    Minutes of Exercise per Session: 30 min  Stress: No Stress Concern Present (03/06/2023)   [employer] of Occupational Health - Occupational Stress Questionnaire    Feeling of Stress : Not at all  Social Connections: Moderately Integrated (03/06/2023)   Social Connection and Isolation Panel [NHANES]    Frequency of Communication with Friends and Family: More than three times a week    Frequency of Social Gatherings with Friends and Family: More than three times a week    Attends Religious Services: More than 4 times per year    Active Member of [employer] or Organizations: Yes    Attends Banker Meetings: More than 4 times per year    Marital Status: Widowed  Intimate Partner Violence: Not At Risk (03/06/2023)   Humiliation, Afraid, Rape, and Kick questionnaire    Fear of Current or Ex-Partner: No    Emotionally Abused: No    Physically Abused: No    Sexually Abused: No   Review of Systems No abdominal pain Slight back pain---nothing new (sciatica) No N/V Eating fine    Objective:   Physical Exam Constitutional:      Appearance: Normal appearance.  Abdominal:     Palpations: Abdomen is soft.     Tenderness: There is no abdominal tenderness. There is no right CVA tenderness, left  CVA tenderness or guarding.  Neurological:     Mental Status: She is alert.            Assessment & Plan:

## 2023-05-01 NOTE — Assessment & Plan Note (Signed)
Mild symptoms---may have some suppression from the cranberry juice Urinalysis shows 2+ leuks, trace blood but negative nitrite Will treat with septra DS bid x 3 days (with refill) Needs to increase water--SG 1.030

## 2023-05-04 DIAGNOSIS — M47816 Spondylosis without myelopathy or radiculopathy, lumbar region: Secondary | ICD-10-CM | POA: Diagnosis not present

## 2023-05-14 DIAGNOSIS — M47816 Spondylosis without myelopathy or radiculopathy, lumbar region: Secondary | ICD-10-CM | POA: Diagnosis not present

## 2023-05-16 DIAGNOSIS — I10 Essential (primary) hypertension: Secondary | ICD-10-CM | POA: Diagnosis not present

## 2023-05-16 DIAGNOSIS — E039 Hypothyroidism, unspecified: Secondary | ICD-10-CM | POA: Diagnosis not present

## 2023-05-16 DIAGNOSIS — D631 Anemia in chronic kidney disease: Secondary | ICD-10-CM | POA: Diagnosis not present

## 2023-05-16 DIAGNOSIS — N1832 Chronic kidney disease, stage 3b: Secondary | ICD-10-CM | POA: Diagnosis not present

## 2023-05-16 DIAGNOSIS — E538 Deficiency of other specified B group vitamins: Secondary | ICD-10-CM | POA: Diagnosis not present

## 2023-06-04 DIAGNOSIS — M47816 Spondylosis without myelopathy or radiculopathy, lumbar region: Secondary | ICD-10-CM | POA: Diagnosis not present

## 2023-06-13 ENCOUNTER — Ambulatory Visit: Payer: Medicare PPO | Admitting: Pulmonary Disease

## 2023-06-13 ENCOUNTER — Encounter: Payer: Self-pay | Admitting: Pulmonary Disease

## 2023-06-13 VITALS — BP 130/64 | HR 57 | Temp 98.1°F | Ht 64.5 in | Wt 144.0 lb

## 2023-06-13 DIAGNOSIS — J45991 Cough variant asthma: Secondary | ICD-10-CM

## 2023-06-13 MED ORDER — PREDNISONE 20 MG PO TABS
40.0000 mg | ORAL_TABLET | Freq: Every day | ORAL | 0 refills | Status: AC
Start: 2023-06-13 — End: 2023-06-18

## 2023-06-13 MED ORDER — BUDESONIDE-FORMOTEROL FUMARATE 160-4.5 MCG/ACT IN AERO
2.0000 | INHALATION_SPRAY | Freq: Two times a day (BID) | RESPIRATORY_TRACT | 12 refills | Status: DC
Start: 2023-06-13 — End: 2024-01-30

## 2023-06-13 MED ORDER — PANTOPRAZOLE SODIUM 40 MG PO TBEC
40.0000 mg | DELAYED_RELEASE_TABLET | Freq: Every day | ORAL | 1 refills | Status: DC
Start: 2023-06-13 — End: 2023-08-16

## 2023-06-13 NOTE — Progress Notes (Signed)
Synopsis: Referred in by Mort Sawyers, FNP   Subjective:   PATIENT ID: Heather Cobb GENDER: female DOB: Feb 20, 1941, MRN: [id number]  Chief Complaint  Patient presents with   Follow-up    Cough during the and night. No shortness of breath or wheezing.    HPI Heather Cobb is a pleasant 82 y.o female patient with a past medical history of hypertension and hypothyroidism presenting today to the pulmonary clinic for follow up on chronic cough.   I last saw her back in August 2024 for a chronic cough and  was a combination of cough variant asthma and GERD for which we prescribed her symbicort and Pantoprazole 40mg  PO Daily. She did improve initially however for the past couple of weeks, the cough has recurred. She has coughing spells with incontinence.  She saw Dr. Celine Mans initially and was managed for GERD and Intermittent asthma she then saw Kandice Robinsons on 04/12/2021. She is coming back now with persistent cough, reports it is dry and does not seem to identify a precipitating factor. She does report some improvement with albuterol inhaler. She denies any heart burn or acid taste in the mouth. She denies any post nasal drip.   FH: Mother and siblings with asthma.   SH: Never smoker, denies alcohol use. No pets at home.   ROS All systems were reviewed and are negative except for the above.  Objective:   Vitals:   06/13/23 1130  BP: 130/64  Pulse: (!) 57  Temp: 98.1 F (36.7 C)  TempSrc: Temporal  SpO2: 98%  Weight: 144 lb (65.3 kg)  Height: 5' 4.5" (1.638 m)   98% on RA BMI Readings from Last 3 Encounters:  06/13/23 24.34 kg/m  05/01/23 24.17 kg/m  04/25/23 24.17 kg/m   Wt Readings from Last 3 Encounters:  06/13/23 144 lb (65.3 kg)  05/01/23 143 lb (64.9 kg)  04/25/23 143 lb (64.9 kg)    Physical Exam GEN: NAD, Healthy Appearing HEENT: Supple Neck, Reactive Pupils, EOMI  CVS: Normal S1, Normal S2, RRR, No murmurs or ES appreciated  Lungs: Clear bilateral air  entry.  Abdomen: Soft, non tender, non distended, + BS  Extremities: Warm and well perfused, No edema  Skin: No suspicious lesions appreciated  Psych: Normal Affect  Ancillary Information   CBC    Component Value Date/Time   WBC 6.0 04/03/2023 0939   RBC 4.02 04/03/2023 0939   HGB 11.7 (L) 04/03/2023 0939   HCT 38.0 04/03/2023 0939   PLT 246 04/03/2023 0939   MCV 94.5 04/03/2023 0939   MCH 29.1 04/03/2023 0939   MCHC 30.8 04/03/2023 0939   RDW 13.9 04/03/2023 0939   LYMPHSABS 2.0 04/03/2023 0939   MONOABS 0.6 04/03/2023 0939   EOSABS 0.2 04/03/2023 0939   BASOSABS 0.0 04/03/2023 0939    Imaging  CXR 10/23:  No active cardiopulmonary disease   CT Soft tissue neck 2024: Pharynx and larynx: No focal mucosal or submucosal lesions present. Focal calcification in the nasopharynx is again noted. The nasopharynx is otherwise clear. The soft palate and tongue base are within normal limits. Oropharynx is unremarkable. Parapharyngeal fat is clear bilaterally. Vallecula and epiglottis are within normal limits. Aryepiglottic folds and piriform sinuses are clear. Vocal cords are midline and symmetric. Trachea is clear.      No data to display           Assessment & Plan:  Heather Cobb is a pleasant 82 y.o female patient with a past medical  history of hypertension and hypothyroidism presenting today to the pulmonary clinic for follow up on chronic cough.   #Chronic cough with DDX multifactorial including Cough variant asthma and GERD. No medications on her list that could cause chronic cough.   []  PFTs  []  Increase  budesonide-formoterol [Symbicort] to 160-4.5 mcg 2 puffs BID.  []  Increase Pantoprazole to 40mg  1 tab BID.  []  c/w Albuterol 2puffs Q6h as needed.   Return in about 3 months (around 09/12/2023).  I spent 30 minutes caring for this patient today, including preparing to see the patient, obtaining a medical history , reviewing a separately obtained history, performing  a medically appropriate examination and/or evaluation, counseling and educating the patient/family/caregiver, ordering medications, tests, or procedures, referring and communicating with other health care professionals (not separately reported), and documenting clinical information in the electronic health record  Janann Colonel, MD  Pulmonary Critical Care 06/13/2023 11:50 AM

## 2023-06-14 DIAGNOSIS — M47816 Spondylosis without myelopathy or radiculopathy, lumbar region: Secondary | ICD-10-CM | POA: Diagnosis not present

## 2023-07-04 ENCOUNTER — Ambulatory Visit: Payer: Medicare PPO | Attending: Pulmonary Disease

## 2023-07-04 DIAGNOSIS — R053 Chronic cough: Secondary | ICD-10-CM | POA: Diagnosis not present

## 2023-07-04 DIAGNOSIS — R0609 Other forms of dyspnea: Secondary | ICD-10-CM | POA: Insufficient documentation

## 2023-07-04 DIAGNOSIS — Z79899 Other long term (current) drug therapy: Secondary | ICD-10-CM | POA: Diagnosis not present

## 2023-07-04 DIAGNOSIS — J45909 Unspecified asthma, uncomplicated: Secondary | ICD-10-CM | POA: Diagnosis not present

## 2023-07-04 DIAGNOSIS — M47816 Spondylosis without myelopathy or radiculopathy, lumbar region: Secondary | ICD-10-CM | POA: Diagnosis not present

## 2023-07-04 LAB — PULMONARY FUNCTION TEST ARMC ONLY
DL/VA % pred: 81 %
DL/VA: 3.32 ml/min/mmHg/L
DLCO unc % pred: 67 %
DLCO unc: 12.66 ml/min/mmHg
FEF 25-75 Post: 2.53 L/s
FEF 25-75 Pre: 1.83 L/s
FEF2575-%Change-Post: 38 %
FEF2575-%Pred-Post: 193 %
FEF2575-%Pred-Pre: 140 %
FEV1-%Change-Post: 12 %
FEV1-%Pred-Post: 87 %
FEV1-%Pred-Pre: 77 %
FEV1-Post: 1.63 L
FEV1-Pre: 1.45 L
FEV1FVC-%Change-Post: 0 %
FEV1FVC-%Pred-Pre: 115 %
FEV6-%Change-Post: 11 %
FEV6-%Pred-Post: 80 %
FEV6-%Pred-Pre: 71 %
FEV6-Post: 1.91 L
FEV6-Pre: 1.71 L
FEV6FVC-%Pred-Post: 106 %
FEV6FVC-%Pred-Pre: 106 %
FVC-%Change-Post: 11 %
FVC-%Pred-Post: 75 %
FVC-%Pred-Pre: 67 %
FVC-Post: 1.91 L
FVC-Pre: 1.71 L
Post FEV1/FVC ratio: 85 %
Post FEV6/FVC ratio: 100 %
Pre FEV1/FVC ratio: 85 %
Pre FEV6/FVC Ratio: 100 %
RV % pred: 52 %
RV: 1.27 L
TLC % pred: 65 %
TLC: 3.32 L

## 2023-07-04 MED ORDER — ALBUTEROL SULFATE (2.5 MG/3ML) 0.083% IN NEBU
2.5000 mg | INHALATION_SOLUTION | Freq: Once | RESPIRATORY_TRACT | Status: AC
  Administered 2023-07-04: 2.5 mg via RESPIRATORY_TRACT
  Filled 2023-07-04: qty 3

## 2023-07-14 DIAGNOSIS — M47816 Spondylosis without myelopathy or radiculopathy, lumbar region: Secondary | ICD-10-CM | POA: Diagnosis not present

## 2023-07-24 ENCOUNTER — Ambulatory Visit: Payer: Medicare PPO | Admitting: Pulmonary Disease

## 2023-08-03 ENCOUNTER — Encounter: Payer: Self-pay | Admitting: Pulmonary Disease

## 2023-08-03 ENCOUNTER — Encounter: Payer: Self-pay | Admitting: Family

## 2023-08-03 DIAGNOSIS — R053 Chronic cough: Secondary | ICD-10-CM

## 2023-08-03 DIAGNOSIS — I10 Essential (primary) hypertension: Secondary | ICD-10-CM

## 2023-08-03 MED ORDER — PANTOPRAZOLE SODIUM 40 MG PO TBEC
40.0000 mg | DELAYED_RELEASE_TABLET | Freq: Two times a day (BID) | ORAL | 1 refills | Status: DC
Start: 2023-08-03 — End: 2023-11-08

## 2023-08-03 MED ORDER — OLMESARTAN MEDOXOMIL 40 MG PO TABS: ORAL_TABLET | ORAL | 3 refills | Status: DC

## 2023-08-04 DIAGNOSIS — M47816 Spondylosis without myelopathy or radiculopathy, lumbar region: Secondary | ICD-10-CM | POA: Diagnosis not present

## 2023-08-14 DIAGNOSIS — M47816 Spondylosis without myelopathy or radiculopathy, lumbar region: Secondary | ICD-10-CM | POA: Diagnosis not present

## 2023-08-16 ENCOUNTER — Ambulatory Visit (INDEPENDENT_AMBULATORY_CARE_PROVIDER_SITE_OTHER): Payer: Medicare PPO | Admitting: Family

## 2023-08-16 VITALS — BP 126/82 | HR 55 | Temp 98.0°F | Ht 65.5 in | Wt 147.0 lb

## 2023-08-16 DIAGNOSIS — R7303 Prediabetes: Secondary | ICD-10-CM | POA: Diagnosis not present

## 2023-08-16 DIAGNOSIS — Z23 Encounter for immunization: Secondary | ICD-10-CM

## 2023-08-16 DIAGNOSIS — E039 Hypothyroidism, unspecified: Secondary | ICD-10-CM

## 2023-08-16 DIAGNOSIS — L819 Disorder of pigmentation, unspecified: Secondary | ICD-10-CM

## 2023-08-16 DIAGNOSIS — R053 Chronic cough: Secondary | ICD-10-CM

## 2023-08-16 DIAGNOSIS — D229 Melanocytic nevi, unspecified: Secondary | ICD-10-CM | POA: Insufficient documentation

## 2023-08-16 DIAGNOSIS — Z13220 Encounter for screening for lipoid disorders: Secondary | ICD-10-CM

## 2023-08-16 DIAGNOSIS — L821 Other seborrheic keratosis: Secondary | ICD-10-CM

## 2023-08-16 DIAGNOSIS — E538 Deficiency of other specified B group vitamins: Secondary | ICD-10-CM

## 2023-08-16 LAB — BASIC METABOLIC PANEL
BUN: 16 mg/dL (ref 6–23)
CO2: 31 meq/L (ref 19–32)
Calcium: 10.1 mg/dL (ref 8.4–10.5)
Chloride: 105 meq/L (ref 96–112)
Creatinine, Ser: 1.35 mg/dL — ABNORMAL HIGH (ref 0.40–1.20)
GFR: 36.54 mL/min — ABNORMAL LOW (ref >60.00)
Glucose, Bld: 90 mg/dL (ref 70–99)
Potassium: 4 meq/L (ref 3.5–5.1)
Sodium: 142 meq/L (ref 135–145)

## 2023-08-16 LAB — LIPID PANEL
Cholesterol: 202 mg/dL — ABNORMAL HIGH (ref 0–200)
HDL: 51.4 mg/dL (ref >39.00)
LDL Cholesterol: 118 mg/dL — ABNORMAL HIGH (ref 0–99)
NonHDL: 150.29
Total CHOL/HDL Ratio: 4
Triglycerides: 163 mg/dL — ABNORMAL HIGH (ref 0.0–149.0)
VLDL: 32.6 mg/dL (ref 0.0–40.0)

## 2023-08-16 LAB — B12 AND FOLATE PANEL
Folate: 10.7 ng/mL (ref >5.9)
Vitamin B-12: 533 pg/mL (ref 211–911)

## 2023-08-16 LAB — TSH: TSH: 1.65 u[IU]/mL (ref 0.35–5.50)

## 2023-08-16 LAB — HEMOGLOBIN A1C: Hgb A1c MFr Bld: 6.3 % (ref 4.6–6.5)

## 2023-08-16 NOTE — Patient Instructions (Addendum)
A referral was placed today for dermatology.  Please let us know if you have not heard back within 2 weeks about the referral.   Regards,   Rosselyn Martha FNP-C

## 2023-08-16 NOTE — Assessment & Plan Note (Signed)
Improving with GERD control

## 2023-08-16 NOTE — Progress Notes (Signed)
Established Patient Office Visit  Subjective:      CC:  Chief Complaint  Patient presents with   Medical Management of Chronic Issues    Pt needs a refill on potassium    HPI: Heather Cobb is a 82 y.o. female presenting on 08/16/2023 for Medical Management of Chronic Issues (Pt needs a refill on potassium) . CKD, still followed by nephrology, with stablity. Anemia stable at 11.5 for hgn, also associated with PTH elevation. Taking iron daily.   PFTs, chronic cough, increased symbicort 160/4.5 2 puffs BID , increase pantoprazole 40 mg 1 tab BID   HTN: controlled with spironolactone, benicar, and amlodipine, on 2 g salt restricted diet.   Constipation: taking linzess, only twice weekly however states regular bowel movements.   Prediabetes:  Lab Results  Component Value Date   HGBA1C 5.8 (A) 04/09/2020   Dr. Kirtland Bouchard, oncology following paraganglioma left carotid artery, stable, f/u annually per note 04/11/23  Requesting refill of potassium because she thinks its low, has been on this in the past for short term replacement. On potassium sparing diuretic, so less likely.     Social history:  Relevant past medical, surgical, family and social history reviewed and updated as indicated. Interim medical history since our last visit reviewed.  Allergies and medications reviewed and updated.  DATA REVIEWED: CHART IN EPIC     ROS: Negative unless specifically indicated above in HPI.    Current Outpatient Medications:    albuterol (VENTOLIN HFA) 108 (90 Base) MCG/ACT inhaler, Inhale 2 puffs into the lungs every 6 (six) hours as needed for wheezing or shortness of breath., Disp: 18 g, Rfl: 11   amLODipine (NORVASC) 5 MG tablet, Take 1 tablet (5 mg total) by mouth 2 (two) times daily., Disp: 180 tablet, Rfl: 3   aspirin EC 81 MG tablet, Take 81 mg by mouth daily., Disp: , Rfl:    budesonide-formoterol (SYMBICORT) 160-4.5 MCG/ACT inhaler, Inhale 2 puffs into the lungs in the  morning and at bedtime., Disp: 1 each, Rfl: 12   cholecalciferol (VITAMIN D) 1000 UNITS tablet, Take 1,000 Units by mouth daily., Disp: , Rfl:    clotrimazole-betamethasone (LOTRISONE) cream, APPLY TOPICALLY TO THE AFFECTED AREA TWICE DAILY, Disp: 30 g, Rfl: 2   DULoxetine (CYMBALTA) 30 MG capsule, Take 1 capsule (30 mg total) by mouth daily., Disp: 90 capsule, Rfl: 3   ferrous sulfate 325 (65 FE) MG tablet, Take 325 mg by mouth daily with breakfast., Disp: , Rfl:    fluticasone (FLONASE) 50 MCG/ACT nasal spray, Place 2 sprays into both nostrils daily., Disp: 16 g, Rfl: 6   levothyroxine (SYNTHROID) 75 MCG tablet, Take 1 tablet (75 mcg total) by mouth daily before breakfast., Disp: 90 tablet, Rfl: 1   linaclotide (LINZESS) 145 MCG CAPS capsule, Take 1 capsule (145 mcg total) by mouth daily as needed (for constipation)., Disp: 90 capsule, Rfl: 3   loratadine (CLARITIN) 10 MG tablet, Take 1 tablet (10 mg total) by mouth daily., Disp: 90 tablet, Rfl: 3   methocarbamol (ROBAXIN-750) 750 MG tablet, Take 1 tablet (750 mg total) by mouth every 12 (twelve) hours as needed for muscle spasms., Disp: 60 tablet, Rfl: 3   olmesartan (BENICAR) 40 MG tablet, TAKE 1 TABLET(40 MG) BY MOUTH DAILY, Disp: 90 tablet, Rfl: 3   pantoprazole (PROTONIX) 40 MG tablet, Take 1 tablet (40 mg total) by mouth 2 (two) times daily., Disp: 60 tablet, Rfl: 1   spironolactone (ALDACTONE) 25 MG tablet, Take 1  tablet (25 mg total) by mouth daily. For blood pressure., Disp: 90 tablet, Rfl: 3   vitamin B-12 (CYANOCOBALAMIN) 1000 MCG tablet, Take 500 mcg by mouth daily., Disp: , Rfl:       Objective:    BP 126/82 (BP Location: Left Arm, Patient Position: Sitting, Cuff Size: Normal)   Pulse (!) 55   Temp 98 F (36.7 C) (Temporal)   Ht 5' 5.5" (1.664 m)   Wt 147 lb (66.7 kg)   SpO2 98%   BMI 24.09 kg/m   Wt Readings from Last 3 Encounters:  08/16/23 147 lb (66.7 kg)  06/13/23 144 lb (65.3 kg)  05/01/23 143 lb (64.9 kg)     Physical Exam Constitutional:      General: She is not in acute distress.    Appearance: Normal appearance. She is normal weight. She is not ill-appearing, toxic-appearing or diaphoretic.  HENT:     Head: Normocephalic.  Cardiovascular:     Rate and Rhythm: Normal rate.  Pulmonary:     Effort: Pulmonary effort is normal.  Musculoskeletal:        General: Normal range of motion.  Neurological:     General: No focal deficit present.     Mental Status: She is alert and oriented to person, place, and time. Mental status is at baseline.  Psychiatric:        Mood and Affect: Mood normal.        Behavior: Behavior normal.        Thought Content: Thought content normal.        Judgment: Judgment normal.    Multiple skin nevi, right anterior neck with raised scaly lesion            Assessment & Plan:  Vitamin B12 deficiency -     B12 and Folate Panel  Screening for lipoid disorders -     Lipid panel  Prediabetes -     Basic metabolic panel -     Hemoglobin A1c  Acquired hypothyroidism -     TSH  Multiple nevi -     Ambulatory referral to Dermatology  Change in pigmented skin lesion  Chronic cough Assessment & Plan: Improving with GERD control    Seborrheic keratoses -     Ambulatory referral to Dermatology  Need for pneumococcal vaccination -     Pneumococcal conjugate vaccine 20-valent     No follow-ups on file.  Mort Sawyers, MSN, APRN, FNP-C [facility]

## 2023-08-20 NOTE — Progress Notes (Signed)
Please call pt, not mychart.  Kidney on the lower end. Is she having any urinary symptoms or feeling fatigued? If any of these can she drop by and leave a urine? Also concentrate on drinking water.    Cholesterol mildly elevated. Work on low cholesterol diet.   A1C diabetic number) jumping up a bit. Work on diabetic diet best you can.

## 2023-08-21 ENCOUNTER — Other Ambulatory Visit: Payer: Self-pay | Admitting: Family

## 2023-08-21 DIAGNOSIS — R944 Abnormal results of kidney function studies: Secondary | ICD-10-CM | POA: Diagnosis not present

## 2023-08-21 NOTE — Progress Notes (Signed)
Urine order in

## 2023-08-23 LAB — URINE CULTURE
MICRO NUMBER:: 15796613
SPECIMEN QUALITY:: ADEQUATE

## 2023-08-25 ENCOUNTER — Other Ambulatory Visit: Payer: Self-pay | Admitting: Family

## 2023-08-25 DIAGNOSIS — N3000 Acute cystitis without hematuria: Secondary | ICD-10-CM

## 2023-08-25 MED ORDER — AMOXICILLIN-POT CLAVULANATE 875-125 MG PO TABS
1.0000 | ORAL_TABLET | Freq: Two times a day (BID) | ORAL | 0 refills | Status: DC
Start: 2023-08-25 — End: 2023-09-08

## 2023-09-03 DIAGNOSIS — M47816 Spondylosis without myelopathy or radiculopathy, lumbar region: Secondary | ICD-10-CM | POA: Diagnosis not present

## 2023-09-08 ENCOUNTER — Encounter: Payer: Self-pay | Admitting: Pulmonary Disease

## 2023-09-08 ENCOUNTER — Ambulatory Visit (INDEPENDENT_AMBULATORY_CARE_PROVIDER_SITE_OTHER): Payer: Medicare PPO | Admitting: Pulmonary Disease

## 2023-09-08 VITALS — BP 120/60 | HR 78 | Temp 97.9°F | Ht 65.5 in | Wt 146.8 lb

## 2023-09-08 DIAGNOSIS — J45909 Unspecified asthma, uncomplicated: Secondary | ICD-10-CM

## 2023-09-08 MED ORDER — AEROCHAMBER HOLDING CHAMBER DEVI
1.0000 | Freq: Every day | 0 refills | Status: AC
Start: 2023-09-08 — End: ?

## 2023-09-08 NOTE — Progress Notes (Signed)
Synopsis: Referred in by Mort Sawyers, FNP   Subjective:   PATIENT ID: Heather Cobb GENDER: female DOB: 1940-10-31, MRN: [id number]  Chief Complaint  Patient presents with   Follow-up    Dry cough.    HPI Heather Cobb is a pleasant 82 y.o female patient with a past medical history of hypertension and hypothyroidism presenting today to the pulmonary clinic for follow up on chronic cough.   I initially saw her back in August 2024 for a chronic cough and  was a combination of cough variant asthma and GERD for which we prescribed her symbicort and Pantoprazole 40mg  PO Daily. She did improve initially however for the past couple of weeks, the cough has recurred. She has coughing spells with incontinence.  She saw Dr. Celine Mans initially and was managed for GERD and Intermittent asthma she then saw Kandice Robinsons on 04/12/2021. She is coming back now with persistent cough, reports it is dry and does not seem to identify a precipitating factor. She does report some improvement with albuterol inhaler. She denies any heart burn or acid taste in the mouth. She denies any post nasal drip.   Increased her Symbicort to 160 with some improvement noted initially however now with recurrent symptoms.   PFTs 2024 with moderate restriction and decreased DLCO. Spirometry with possible response to bronchodilators.   FH: Mother and siblings with asthma.   SH: Never smoker, denies alcohol use. No pets at home.   ROS All systems were reviewed and are negative except for the above.  Objective:   Vitals:   09/08/23 1136  BP: 120/60  Pulse: 78  Temp: 97.9 F (36.6 C)  TempSrc: Temporal  SpO2: 96%  Weight: 146 lb 12.8 oz (66.6 kg)  Height: 5' 5.5" (1.664 m)   96% on RA BMI Readings from Last 3 Encounters:  09/08/23 24.06 kg/m  08/16/23 24.09 kg/m  06/13/23 24.34 kg/m   Wt Readings from Last 3 Encounters:  09/08/23 146 lb 12.8 oz (66.6 kg)  08/16/23 147 lb (66.7 kg)  06/13/23 144 lb (65.3 kg)     Physical Exam GEN: NAD, Healthy Appearing HEENT: Supple Neck, Reactive Pupils, EOMI  CVS: Normal S1, Normal S2, RRR, No murmurs or ES appreciated  Lungs: Clear bilateral air entry.  Abdomen: Soft, non tender, non distended, + BS  Extremities: Warm and well perfused, No edema  Skin: No suspicious lesions appreciated  Psych: Normal Affect  Ancillary Information   CBC    Component Value Date/Time   WBC 6.0 04/03/2023 0939   RBC 4.02 04/03/2023 0939   HGB 11.7 (L) 04/03/2023 0939   HCT 38.0 04/03/2023 0939   PLT 246 04/03/2023 0939   MCV 94.5 04/03/2023 0939   MCH 29.1 04/03/2023 0939   MCHC 30.8 04/03/2023 0939   RDW 13.9 04/03/2023 0939   LYMPHSABS 2.0 04/03/2023 0939   MONOABS 0.6 04/03/2023 0939   EOSABS 0.2 04/03/2023 0939   BASOSABS 0.0 04/03/2023 0939    Imaging  CXR 10/23:  No active cardiopulmonary disease   CT Soft tissue neck 2024: Pharynx and larynx: No focal mucosal or submucosal lesions present. Focal calcification in the nasopharynx is again noted. The nasopharynx is otherwise clear. The soft palate and tongue base are within normal limits. Oropharynx is unremarkable. Parapharyngeal fat is clear bilaterally. Vallecula and epiglottis are within normal limits. Aryepiglottic folds and piriform sinuses are clear. Vocal cords are midline and symmetric. Trachea is clear.     Latest Ref Rng & Units  07/04/2023    8:44 AM  PFT Results  FVC-Pre L 1.71   FVC-Predicted Pre % 67   FVC-Post L 1.91   FVC-Predicted Post % 75   Pre FEV1/FVC % % 85   Post FEV1/FCV % % 85   FEV1-Pre L 1.45   FEV1-Predicted Pre % 77   FEV1-Post L 1.63   DLCO uncorrected ml/min/mmHg 12.66   DLCO UNC% % 67   DLVA Predicted % 81   TLC L 3.32   TLC % Predicted % 65   RV % Predicted % 52      Assessment & Plan:  Heather Cobb is a pleasant 82 y.o female patient with a past medical history of hypertension and hypothyroidism presenting today to the pulmonary clinic for follow up on  chronic cough.   #CVA Eos 200 []  continue  budesonide-formoterol [Symbicort] to 160-4.5 mcg 2 puffs BID. Inhaler training in office provided   []  c/w Albuterol 2puffs Q6h as needed.   #Moderate Restriction  Unclear reason so far ddx includes ILD vs Diaphragmatic weakness.  []  HRCT ordered  []  ANA 12 profile and myomarkers ordered.   #GERD []  c/w Pantoprazole to 40mg  1 tab BID.  Return in about 3 months (around 12/07/2023).  I spent 60 minutes caring for this patient today, including preparing to see the patient, obtaining a medical history , reviewing a separately obtained history, performing a medically appropriate examination and/or evaluation, counseling and educating the patient/family/caregiver, ordering medications, tests, or procedures, referring and communicating with other health care professionals (not separately reported), and documenting clinical information in the electronic health record  Janann Colonel, MD [place] [facility] 09/08/2023 1:50 PM

## 2023-09-13 DIAGNOSIS — M47816 Spondylosis without myelopathy or radiculopathy, lumbar region: Secondary | ICD-10-CM | POA: Diagnosis not present

## 2023-09-15 ENCOUNTER — Encounter: Payer: Self-pay | Admitting: Family

## 2023-09-15 DIAGNOSIS — K5900 Constipation, unspecified: Secondary | ICD-10-CM

## 2023-09-25 MED ORDER — LINACLOTIDE 145 MCG PO CAPS: 145.0000 ug | ORAL_CAPSULE | Freq: Every day | ORAL | 1 refills | Status: DC | PRN

## 2023-10-04 DIAGNOSIS — M47816 Spondylosis without myelopathy or radiculopathy, lumbar region: Secondary | ICD-10-CM | POA: Diagnosis not present

## 2023-10-12 LAB — ANA 12 PLUS PROFILE, POSITIVE
Anti-CCP Ab, IgG & IgA (RDL): <20 U (ref <20)
Anti-Cardiolipin Ab, IgA (RDL): <12 [APL'U]/mL (ref <12)
Anti-Cardiolipin Ab, IgG (RDL): <15 [GPL'U]/mL (ref <15)
Anti-Cardiolipin Ab, IgM (RDL): <13 [MPL'U]/mL (ref <13)
Anti-Centromere Ab (RDL): <1:40 {titer}
Anti-Chromatin Ab, IgG (RDL): 50 U — ABNORMAL HIGH (ref <20)
Anti-La (SS-B) Ab (RDL): <20 U (ref <20)
Anti-Ro (SS-A) Ab (RDL): <20 U (ref <20)
Anti-Scl-70 Ab (RDL): <20 U (ref <20)
Anti-Sm Ab (RDL): <20 U (ref <20)
Anti-TPO Ab (RDL): 117.3 [IU]/mL — ABNORMAL HIGH (ref <9.0)
Anti-dsDNA Ab by Farr(RDL): <8 [IU]/mL (ref <8.0)
C3 Complement (RDL): 154 mg/dL (ref 82–167)
C4 Complement (RDL): 25 mg/dL (ref 14–44)
Rheumatoid Factor by Turb RDL: <14 [IU]/mL (ref <14)
Speckled Pattern: 1:160 {titer} — ABNORMAL HIGH

## 2023-10-12 LAB — MYOMARKER 3 PLUS PROFILE (RDL)
Anti-EJ Ab (RDL): NEGATIVE
Anti-Jo-1 Ab (RDL): <20 U (ref <20)
Anti-Ku Ab (RDL): NEGATIVE
Anti-MDA-5 Ab (CADM-140)(RDL): <20 U (ref <20)
Anti-Mi-2 Ab (RDL): NEGATIVE
Anti-NXP-2 (P140) Ab (RDL): <20 U (ref <20)
Anti-OJ Ab (RDL): NEGATIVE
Anti-PL-12 Ab (RDL: NEGATIVE
Anti-PL-7 Ab (RDL): NEGATIVE
Anti-PM/Scl-100 Ab (RDL): 43 U — ABNORMAL HIGH (ref <20)
Anti-SAE1 Ab, IgG (RDL): <20 U (ref <20)
Anti-SRP Ab (RDL): NEGATIVE
Anti-SS-A 52kD Ab, IgG (RDL): <20 U (ref <20)
Anti-TIF-1gamma Ab (RDL): <20 U (ref <20)
Anti-U1 RNP Ab (RDL): 24 U — ABNORMAL HIGH (ref <20)
Anti-U2 RNP Ab (RDL): NEGATIVE
Anti-U3 RNP (Fibrillarin)(RDL): NEGATIVE

## 2023-10-12 LAB — ANA 12 PLUS PROFILE (RDL): Anti-Nuclear Ab by IFA (RDL): POSITIVE — AB

## 2023-10-14 DIAGNOSIS — M47816 Spondylosis without myelopathy or radiculopathy, lumbar region: Secondary | ICD-10-CM | POA: Diagnosis not present

## 2023-10-20 ENCOUNTER — Other Ambulatory Visit: Payer: Self-pay | Admitting: *Deleted

## 2023-10-27 ENCOUNTER — Encounter: Payer: Self-pay | Admitting: Family

## 2023-10-30 ENCOUNTER — Ambulatory Visit: Payer: Self-pay | Admitting: Family

## 2023-10-30 NOTE — Telephone Encounter (Signed)
 Noted.

## 2023-10-30 NOTE — Telephone Encounter (Signed)
  Chief Complaint: urinary symptoms Symptoms: odor, increased frequency, burning with urination Frequency: 6 days Pertinent Negatives: Patient denies fever, blood in urine Disposition: [] ED /[] Urgent Care (no appt availability in office) / [x] Appointment(In office/virtual)/ []  [facility]/ [] Home Care/ [] Refused Recommended Disposition /[] [facility]/ []  Follow-up with PCP Additional Notes: Patient daughter, [place] on [place], calls stating patient has had UTI symptoms x 6 days. Per protocol, patient to be evaluated within 24 hours. First available appointment with PCP 10/31/23 @ 0900, patient scheduled at caller request. Care advice reviewed, patient verbalized understandingand denies further questions at this time. Alerting PCP for review.    Copied from CRM [id number]. Topic: Clinical - Red Word Triage >> Oct 30, 2023  9:26 AM Varney Gentleman wrote: [facility] that prompted transfer to Nurse Triage: Patients daughter states mom has an UTI having odor and burning. Reason for Disposition  Urinating more frequently than usual (i.e., frequency)  Answer Assessment - Initial Assessment Questions 1. SYMPTOM: "What's the main symptom you're concerned about?" (e.g., frequency, incontinence)     Odor, increased frequency, burning with urination. 2. ONSET: "When did the  symptoms  start?"     6 days 3. PAIN: "Is there any pain?" If Yes, ask: "How bad is it?" (Scale: 1-10; mild, moderate, severe)     Denies 4. CAUSE: "What do you think is causing the symptoms?"     UTI 5. OTHER SYMPTOMS: "Do you have any other symptoms?" (e.g., blood in urine, fever, flank pain, pain with urination)     Pain with urination  Protocols used: Urinary Symptoms-A-AH

## 2023-10-31 ENCOUNTER — Ambulatory Visit (INDEPENDENT_AMBULATORY_CARE_PROVIDER_SITE_OTHER): Payer: Medicare PPO | Admitting: Family

## 2023-10-31 VITALS — BP 118/52 | HR 57 | Temp 97.6°F | Ht 65.5 in | Wt 147.8 lb

## 2023-10-31 DIAGNOSIS — R82998 Other abnormal findings in urine: Secondary | ICD-10-CM | POA: Diagnosis not present

## 2023-10-31 DIAGNOSIS — R300 Dysuria: Secondary | ICD-10-CM

## 2023-10-31 LAB — POC URINALSYSI DIPSTICK (AUTOMATED)
Bilirubin, UA: NEGATIVE
Glucose, UA: NEGATIVE
Ketones, UA: NEGATIVE
Nitrite, UA: NEGATIVE
Protein, UA: POSITIVE — AB
Spec Grav, UA: 1.025 (ref 1.010–1.025)
Urobilinogen, UA: NEGATIVE U/dL — AB
pH, UA: 5 (ref 5.0–8.0)

## 2023-10-31 MED ORDER — CEPHALEXIN 500 MG PO CAPS: 500.0000 mg | ORAL_CAPSULE | Freq: Two times a day (BID) | ORAL | 0 refills | Status: AC

## 2023-10-31 NOTE — Progress Notes (Signed)
Established Patient Office Visit  Subjective:   Patient ID: Heather Cobb, female    DOB: Nov 01, 1940  Age: 83 y.o. MRN: [id number]  CC:  Chief Complaint  Patient presents with   Urinary Frequency   Dysuria   urine odor    X 2 weeks    HPI: Heather Cobb is a 83 y.o. female presenting on 10/31/2023 for Urinary Frequency, Dysuria, and urine odor (X 2 weeks)  Two weeks ago started with frequency, dysuria, and urine odor. No flank pain, no fever. She is trying to drink more water but doesn't drink a lot during the day. No lower abdominal pain.        ROS: Negative unless specifically indicated above in HPI.   Relevant past medical history reviewed and updated as indicated.   Allergies and medications reviewed and updated.   Current Outpatient Medications:    albuterol (VENTOLIN HFA) 108 (90 Base) MCG/ACT inhaler, Inhale 2 puffs into the lungs every 6 (six) hours as needed for wheezing or shortness of breath., Disp: 18 g, Rfl: 11   amLODipine (NORVASC) 5 MG tablet, Take 1 tablet (5 mg total) by mouth 2 (two) times daily., Disp: 180 tablet, Rfl: 3   aspirin EC 81 MG tablet, Take 81 mg by mouth daily., Disp: , Rfl:    budesonide-formoterol (SYMBICORT) 160-4.5 MCG/ACT inhaler, Inhale 2 puffs into the lungs in the morning and at bedtime., Disp: 1 each, Rfl: 12   cephALEXin (KEFLEX) 500 MG capsule, Take 1 capsule (500 mg total) by mouth 2 (two) times daily for 7 days., Disp: 14 capsule, Rfl: 0   cholecalciferol (VITAMIN D) 1000 UNITS tablet, Take 1,000 Units by mouth daily., Disp: , Rfl:    clotrimazole-betamethasone (LOTRISONE) cream, APPLY TOPICALLY TO THE AFFECTED AREA TWICE DAILY, Disp: 30 g, Rfl: 2   DULoxetine (CYMBALTA) 30 MG capsule, Take 1 capsule (30 mg total) by mouth daily., Disp: 90 capsule, Rfl: 3   ferrous sulfate 325 (65 FE) MG tablet, Take 325 mg by mouth daily with breakfast., Disp: , Rfl:    fluticasone (FLONASE) 50 MCG/ACT nasal spray, Place 2 sprays into both  nostrils daily., Disp: 16 g, Rfl: 6   levothyroxine (SYNTHROID) 75 MCG tablet, Take 1 tablet (75 mcg total) by mouth daily before breakfast., Disp: 90 tablet, Rfl: 1   linaclotide (LINZESS) 145 MCG CAPS capsule, Take 1 capsule (145 mcg total) by mouth daily as needed (for constipation)., Disp: 90 capsule, Rfl: 1   loratadine (CLARITIN) 10 MG tablet, Take 1 tablet (10 mg total) by mouth daily., Disp: 90 tablet, Rfl: 3   methocarbamol (ROBAXIN-750) 750 MG tablet, Take 1 tablet (750 mg total) by mouth every 12 (twelve) hours as needed for muscle spasms., Disp: 60 tablet, Rfl: 3   olmesartan (BENICAR) 40 MG tablet, TAKE 1 TABLET(40 MG) BY MOUTH DAILY, Disp: 90 tablet, Rfl: 3   pantoprazole (PROTONIX) 40 MG tablet, Take 1 tablet (40 mg total) by mouth 2 (two) times daily., Disp: 60 tablet, Rfl: 1   Spacer/Aero-Holding Chambers (AEROCHAMBER HOLDING CHAMBER) DEVI, 1 each by Does not apply route daily., Disp: 1 each, Rfl: 0   spironolactone (ALDACTONE) 25 MG tablet, Take 1 tablet (25 mg total) by mouth daily. For blood pressure., Disp: 90 tablet, Rfl: 3   vitamin B-12 (CYANOCOBALAMIN) 1000 MCG tablet, Take 500 mcg by mouth daily., Disp: , Rfl:   Allergies  Allergen Reactions   Codeine Nausea Only and Other (See Comments)    fever fever  fever    Objective:   BP (!) 118/52   Pulse (!) 57   Temp 97.6 F (36.4 C) (Oral)   Ht 5' 5.5" (1.664 m)   Wt 147 lb 12.8 oz (67 kg)   SpO2 99%   BMI 24.22 kg/m    Physical Exam Constitutional:      General: She is not in acute distress.    Appearance: Normal appearance. She is normal weight. She is not ill-appearing, toxic-appearing or diaphoretic.  Cardiovascular:     Rate and Rhythm: Normal rate.  Pulmonary:     Effort: Pulmonary effort is normal.  Abdominal:     General: Abdomen is flat.     Tenderness: There is no abdominal tenderness. There is no right CVA tenderness or left CVA tenderness.  Neurological:     General: No focal deficit present.      Mental Status: She is alert and oriented to person, place, and time. Mental status is at baseline.     Motor: No weakness.  Psychiatric:        Mood and Affect: Mood normal.        Behavior: Behavior normal.        Thought Content: Thought content normal.        Judgment: Judgment normal.     Assessment & Plan:  Dysuria -     POCT Urinalysis Dipstick (Automated) -     Urine Culture  Leukocytes in urine Assessment & Plan: poct urine dip in office Urine culture ordered pending results antbx sent to pharmacy, pt to take as directed. Encouraged increased water intake throughout the day. Choosing to treat due to being symptomatic. If no improvement in the next 2 days pt advised to let me know.  Rx cephalexin 500 mg twice daily for seven days   Orders: -     Urine Culture -     Cephalexin; Take 1 capsule (500 mg total) by mouth 2 (two) times daily for 7 days.  Dispense: 14 capsule; Refill: 0     Follow up plan: Return if symptoms worsen or fail to improve.  Mort Sawyers, FNP

## 2023-10-31 NOTE — Assessment & Plan Note (Signed)
poct urine dip in office Urine culture ordered pending results antbx sent to pharmacy, pt to take as directed. Encouraged increased water intake throughout the day. Choosing to treat due to being symptomatic. If no improvement in the next 2 days pt advised to let me know.  Rx cephalexin 500 mg twice daily for seven days

## 2023-11-02 ENCOUNTER — Encounter: Payer: Self-pay | Admitting: Family

## 2023-11-02 LAB — URINE CULTURE
MICRO NUMBER:: 16069669
SPECIMEN QUALITY:: ADEQUATE

## 2023-11-04 DIAGNOSIS — M47816 Spondylosis without myelopathy or radiculopathy, lumbar region: Secondary | ICD-10-CM | POA: Diagnosis not present

## 2023-11-08 ENCOUNTER — Other Ambulatory Visit: Payer: Self-pay | Admitting: Family

## 2023-11-08 ENCOUNTER — Encounter: Payer: Self-pay | Admitting: Family

## 2023-11-08 DIAGNOSIS — E039 Hypothyroidism, unspecified: Secondary | ICD-10-CM

## 2023-11-08 DIAGNOSIS — R053 Chronic cough: Secondary | ICD-10-CM

## 2023-11-08 DIAGNOSIS — K219 Gastro-esophageal reflux disease without esophagitis: Secondary | ICD-10-CM

## 2023-11-08 MED ORDER — LEVOTHYROXINE SODIUM 75 MCG PO TABS: 75.0000 ug | ORAL_TABLET | Freq: Every day | ORAL | 3 refills | Status: DC

## 2023-11-08 MED ORDER — FAMOTIDINE 20 MG PO TABS: 20.0000 mg | ORAL_TABLET | Freq: Two times a day (BID) | ORAL | 3 refills | Status: AC

## 2023-11-08 MED ORDER — FAMOTIDINE 20 MG PO TABS: 20.0000 mg | ORAL_TABLET | Freq: Two times a day (BID) | ORAL | 3 refills | Status: DC

## 2023-11-08 MED ORDER — PANTOPRAZOLE SODIUM 40 MG PO TBEC
40.0000 mg | DELAYED_RELEASE_TABLET | Freq: Every day | ORAL | 3 refills | Status: DC
Start: 2023-11-08 — End: 2023-12-18

## 2023-11-08 MED ORDER — PANTOPRAZOLE SODIUM 40 MG PO TBEC: 40.0000 mg | DELAYED_RELEASE_TABLET | Freq: Every day | ORAL | 3 refills | Status: DC

## 2023-11-08 NOTE — Addendum Note (Signed)
Addended by: Patience Musca on: 11/08/2023 09:47 AM   Modules accepted: Orders

## 2023-11-20 DIAGNOSIS — D631 Anemia in chronic kidney disease: Secondary | ICD-10-CM | POA: Diagnosis not present

## 2023-11-20 DIAGNOSIS — D500 Iron deficiency anemia secondary to blood loss (chronic): Secondary | ICD-10-CM | POA: Diagnosis not present

## 2023-11-20 DIAGNOSIS — E039 Hypothyroidism, unspecified: Secondary | ICD-10-CM | POA: Diagnosis not present

## 2023-11-20 DIAGNOSIS — I10 Essential (primary) hypertension: Secondary | ICD-10-CM | POA: Diagnosis not present

## 2023-11-20 DIAGNOSIS — E538 Deficiency of other specified B group vitamins: Secondary | ICD-10-CM | POA: Diagnosis not present

## 2023-11-20 DIAGNOSIS — N1832 Chronic kidney disease, stage 3b: Secondary | ICD-10-CM | POA: Diagnosis not present

## 2023-11-21 DIAGNOSIS — N1832 Chronic kidney disease, stage 3b: Secondary | ICD-10-CM | POA: Diagnosis not present

## 2023-11-21 DIAGNOSIS — D631 Anemia in chronic kidney disease: Secondary | ICD-10-CM | POA: Diagnosis not present

## 2023-11-21 DIAGNOSIS — I10 Essential (primary) hypertension: Secondary | ICD-10-CM | POA: Diagnosis not present

## 2023-11-21 DIAGNOSIS — E538 Deficiency of other specified B group vitamins: Secondary | ICD-10-CM | POA: Diagnosis not present

## 2023-11-21 DIAGNOSIS — E039 Hypothyroidism, unspecified: Secondary | ICD-10-CM | POA: Diagnosis not present

## 2023-12-04 ENCOUNTER — Ambulatory Visit: Payer: Medicare PPO | Admitting: Dermatology

## 2023-12-08 ENCOUNTER — Ambulatory Visit
Admission: RE | Admit: 2023-12-08 | Discharge: 2023-12-08 | Disposition: A | Discharge status: Home / Self Care | Payer: Medicare PPO | Source: Ambulatory Visit | Attending: Pulmonary Disease | Admitting: Pulmonary Disease

## 2023-12-08 DIAGNOSIS — I2510 Atherosclerotic heart disease of native coronary artery without angina pectoris: Secondary | ICD-10-CM | POA: Diagnosis not present

## 2023-12-08 DIAGNOSIS — R918 Other nonspecific abnormal finding of lung field: Secondary | ICD-10-CM | POA: Diagnosis not present

## 2023-12-08 DIAGNOSIS — J45909 Unspecified asthma, uncomplicated: Secondary | ICD-10-CM

## 2023-12-08 DIAGNOSIS — R0602 Shortness of breath: Secondary | ICD-10-CM | POA: Diagnosis not present

## 2023-12-14 ENCOUNTER — Ambulatory Visit: Payer: Medicare PPO | Admitting: Dermatology

## 2023-12-14 ENCOUNTER — Encounter: Payer: Self-pay | Admitting: Dermatology

## 2023-12-14 DIAGNOSIS — L821 Other seborrheic keratosis: Secondary | ICD-10-CM

## 2023-12-14 NOTE — Patient Instructions (Addendum)
 Seborrheic Keratosis  What causes seborrheic keratoses? Seborrheic keratoses are harmless, common skin growths that first appear during adult life.  As time goes by, more growths appear.  Some people may develop a large number of them.  Seborrheic keratoses appear on both covered and uncovered body parts.  They are not caused by sunlight.  The tendency to develop seborrheic keratoses can be inherited.  They vary in color from skin-colored to gray, brown, or even black.  They can be either smooth or have a rough, warty surface.   Seborrheic keratoses are superficial and look as if they were stuck on the skin.  Under the microscope this type of keratosis looks like layers upon layers of skin.  That is why at times the top layer may seem to fall off, but the rest of the growth remains and re-grows.    Treatment Seborrheic keratoses do not need to be treated, but can easily be removed in the office.  Seborrheic keratoses often cause symptoms when they rub on clothing or jewelry.  Lesions can be in the way of shaving.  If they become inflamed, they can cause itching, soreness, or burning.  Removal of a seborrheic keratosis can be accomplished by freezing, burning, or surgery. If any spot bleeds, scabs, or grows rapidly, please return to have it checked, as these can be an indication of a skin cancer.     Recommend daily broad spectrum sunscreen SPF 30+ to sun-exposed areas, reapply every 2 hours as needed. Call for new or changing lesions.  Staying in the shade or wearing long sleeves, sun glasses (UVA+UVB protection) and wide brim hats (4-inch brim around the entire circumference of the hat) are also recommended for sun protection.      Due to recent changes in healthcare laws, you may see results of your pathology and/or laboratory studies on MyChart before the doctors have had a chance to review them. We understand that in some cases there may be results that are confusing or concerning to you.  Please understand that not all results are received at the same time and often the doctors may need to interpret multiple results in order to provide you with the best plan of care or course of treatment. Therefore, we ask that you please give [place] 2 business days to thoroughly review all your results before contacting the office for clarification. Should we see a critical lab result, you will be contacted sooner.   If You Need Anything After Your Visit  If you have any questions or concerns for your doctor, please call our main line at [phone] and press option 4 to reach your doctor's medical assistant. If no one answers, please leave a voicemail as directed and we will return your call as soon as possible. Messages left after 4 pm will be answered the following business day.   You may also send [place] a message via MyChart. We typically respond to MyChart messages within 1-2 business days.  For prescription refills, please ask your pharmacy to contact our office. Our fax number is [phone].  If you have an urgent issue when the clinic is closed that cannot wait until the next business day, you can page your doctor at the number below.    Please note that while we do our best to be available for urgent issues outside of office hours, we are not available 24/7.   If you have an urgent issue and are unable to reach [place], you may choose to seek  medical care at your doctor's office, retail clinic, urgent care center, or emergency room.  If you have a medical emergency, please immediately call 911 or go to the emergency department.  Pager Numbers  - Dr. Gwen Pounds: [phone]  - Dr. Roseanne Reno: [phone]  - Dr. Katrinka Blazing: [phone]   In the event of inclement weather, please call our main line at [phone] for an update on the status of any delays or closures.  Dermatology Medication Tips: Please keep the boxes that topical medications come in in order to help keep track of the  instructions about where and how to use these. Pharmacies typically print the medication instructions only on the boxes and not directly on the medication tubes.   If your medication is too expensive, please contact our office at [phone] option 4 or send [place] a message through MyChart.   We are unable to tell what your co-pay for medications will be in advance as this is different depending on your insurance coverage. However, we may be able to find a substitute medication at lower cost or fill out paperwork to get insurance to cover a needed medication.   If a prior authorization is required to get your medication covered by your insurance company, please allow [place] 1-2 business days to complete this process.  Drug prices often vary depending on where the prescription is filled and some pharmacies may offer cheaper prices.  The website www.goodrx.com contains coupons for medications through different pharmacies. The prices here do not account for what the cost may be with help from insurance (it may be cheaper with your insurance), but the website can give you the price if you did not use any insurance.  - You can print the associated coupon and take it with your prescription to the pharmacy.  - You may also stop by our office during regular business hours and pick up a GoodRx coupon card.  - If you need your prescription sent electronically to a different pharmacy, notify our office through [facility] or by phone at [phone] option 4.     Si Usted Necesita Algo Despus de Su Visita  Tambin puede enviarnos un mensaje a travs de Clinical cytogeneticist. Por lo general respondemos a los mensajes de MyChart en el transcurso de 1 a 2 das hbiles.  Para renovar recetas, por favor pida a su farmacia que se ponga en contacto con nuestra oficina. Annie Sable de fax es [phone].  Si tiene un asunto urgente cuando la clnica est cerrada y que no puede esperar hasta el siguiente da hbil,  puede llamar/localizar a su doctor(a) al nmero que aparece a continuacin.   Por favor, tenga en cuenta que aunque hacemos todo lo posible para estar disponibles para asuntos urgentes fuera del horario de [place], no estamos disponibles las 24 horas del da, los 7 [address]  [address] de la [place].   Si tiene un problema urgente y no puede comunicarse con nosotros, puede optar por buscar atencin mdica  en el consultorio de su doctor(a), en una clnica privada, en un centro de atencin urgente o en una sala de emergencias.  Si tiene Engineer, drilling, por favor llame inmediatamente al 911 o vaya a la sala de emergencias.  Nmeros de bper  - Dr. Gwen Pounds: [phone]  - Dra. Roseanne Reno: [phone]  - Dr. Katrinka Blazing: [phone]   En caso de inclemencias del tiempo, por favor llame a Lacy Duverney principal al [phone] para una actualizacin sobre el [place] de cualquier retraso o cierre.  Consejos  para Tourist information centre manager en dermatologa: Por favor, guarde las cajas en las que vienen los medicamentos de uso tpico para ayudarle a seguir las instrucciones sobre dnde y cmo usarlos. Las farmacias generalmente imprimen las instrucciones del medicamento slo en las cajas y no directamente en los tubos del [place].   Si su medicamento es muy caro, por favor, pngase en contacto con Rolm Gala llamando al [phone] y presione la opcin 4 o envenos un mensaje a travs de Clinical cytogeneticist.   No podemos decirle cul ser su copago por los medicamentos por adelantado ya que esto es diferente dependiendo de la cobertura de su seguro. Sin embargo, es posible que podamos encontrar un medicamento sustituto a Audiological scientist un formulario para que el seguro cubra el medicamento que se considera necesario.   Si se requiere una autorizacin previa para que su compaa de seguros [place] su medicamento, por favor permtanos de 1 a 2 das hbiles para completar [address]  Los precios de los medicamentos varan  con frecuencia dependiendo del Environmental consultant de dnde se surte la receta y alguna farmacias pueden ofrecer precios ms baratos.  El sitio web www.goodrx.com tiene cupones para medicamentos de Health and safety inspector. Los precios aqu no tienen en cuenta lo que podra costar con la ayuda del seguro (puede ser ms barato con su seguro), pero el sitio web puede darle el precio si no utiliz Tourist information centre manager.  - Puede imprimir el cupn correspondiente y llevarlo con su receta a la farmacia.  - Tambin puede pasar por nuestra oficina durante el horario de atencin regular y Education officer, museum una tarjeta de cupones de GoodRx.  - Si necesita que su receta se enve electrnicamente a una farmacia diferente, informe a nuestra oficina a travs de MyChart de [place] o por telfono llamando al [phone] y presione la opcin 4.

## 2023-12-14 NOTE — Progress Notes (Signed)
   New Patient Visit   Subjective  Heather Cobb is a 83 y.o. female who presents for the following: Moles. Check moles on face and neck. Some are raised and irritated. No personal or family hx of skin cancer or abnormal moles.   The patient has spots, moles and lesions to be evaluated, some may be new or changing and the patient may have concern these could be cancer.  Daugher is with patient and contributes to history.   The following portions of the chart were reviewed this encounter and updated as appropriate: medications, allergies, medical history  Review of Systems:  No other skin or systemic complaints except as noted in HPI or Assessment and Plan.  Objective  Well appearing patient in no apparent distress; mood and affect are within normal limits.  A focused examination was performed of the following areas: Face, neck  Relevant exam findings are noted in the Assessment and Plan.    Assessment & Plan   SEBORRHEIC KERATOSIS - Stuck-on, waxy, tan-brown papules and/or plaques at face and neck - Benign-appearing - Discussed benign etiology and prognosis. - Observe - Call for any changes  Discussed risk of scarring with treatment.     SEBORRHEIC KERATOSES    Return if symptoms worsen or fail to improve.  I, Lawson Radar, CMA, am acting as scribe for Elie Goody, MD.   Documentation: I have reviewed the above documentation for accuracy and completeness, and I agree with the above.  Elie Goody, MD

## 2023-12-18 ENCOUNTER — Encounter: Payer: Self-pay | Admitting: Family

## 2023-12-18 DIAGNOSIS — R053 Chronic cough: Secondary | ICD-10-CM

## 2023-12-18 MED ORDER — PANTOPRAZOLE SODIUM 40 MG PO TBEC: 40.0000 mg | DELAYED_RELEASE_TABLET | Freq: Every day | ORAL | 3 refills | Status: DC

## 2023-12-21 ENCOUNTER — Ambulatory Visit: Payer: Medicare PPO | Admitting: Pulmonary Disease

## 2023-12-25 ENCOUNTER — Encounter: Payer: Self-pay | Admitting: Pulmonary Disease

## 2023-12-25 ENCOUNTER — Ambulatory Visit (INDEPENDENT_AMBULATORY_CARE_PROVIDER_SITE_OTHER): Admitting: Pulmonary Disease

## 2023-12-25 VITALS — BP 124/66 | HR 54 | Temp 97.6°F | Ht 65.5 in | Wt 150.0 lb

## 2023-12-25 DIAGNOSIS — J984 Other disorders of lung: Secondary | ICD-10-CM

## 2023-12-25 NOTE — Progress Notes (Signed)
 Synopsis: Referred in by Mort Sawyers, FNP   Subjective:   PATIENT ID: Heather Cobb GENDER: female DOB: 1940-12-05, MRN: [id number]  Chief Complaint  Patient presents with   Follow-up    Cough. No shortness of breath or wheezing.     HPI Heather Cobb is a pleasant 83 y.o female patient with a past medical history of hypertension and hypothyroidism presenting today to the pulmonary clinic for follow up on chronic cough.   I initially saw her back in August 2024 for a chronic cough and  was a combination of cough variant asthma and GERD for which we prescribed her symbicort and Pantoprazole 40mg  PO Daily. She did improve initially however for the past couple of weeks, the cough has recurred. She has coughing spells with incontinence.  She saw Dr. Celine Mans initially and was managed for GERD and Intermittent asthma she then saw Kandice Robinsons on 04/12/2021. She is coming back now with persistent cough, reports it is dry and does not seem to identify a precipitating factor. She does report some improvement with albuterol inhaler. She denies any heart burn or acid taste in the mouth. She denies any post nasal drip.   Increased her Symbicort to 160 with some improvement noted initially however now with recurrent symptoms.   PFTs 2024 with moderate restriction and decreased DLCO. Spirometry with possible response to bronchodilators.   High res CT chest 2025 without any sign of ILD.   ANA + 1:160 Scl100 + and U1RNP.   She is doing fairly well on symbicort. Still has intermittent cough but improved on symbicort and PPI. She has no systemic symptoms including stiff joints, rash, raynauds dysphagia.   FH: Mother and siblings with asthma.   SH: Never smoker, denies alcohol use. No pets at home.   ROS All systems were reviewed and are negative except for the above.  Objective:   Vitals:   12/25/23 1107  BP: 124/66  Pulse: (!) 54  Temp: 97.6 F (36.4 C)  TempSrc: Temporal  SpO2: 100%   Weight: 150 lb (68 kg)  Height: 5' 5.5" (1.664 m)   100% on RA BMI Readings from Last 3 Encounters:  12/25/23 24.58 kg/m  10/31/23 24.22 kg/m  09/08/23 24.06 kg/m   Wt Readings from Last 3 Encounters:  12/25/23 150 lb (68 kg)  10/31/23 147 lb 12.8 oz (67 kg)  09/08/23 146 lb 12.8 oz (66.6 kg)    Physical Exam GEN: NAD, Healthy Appearing HEENT: Supple Neck, Reactive Pupils, EOMI  CVS: Normal S1, Normal S2, RRR, No murmurs or ES appreciated  Lungs: Clear bilateral air entry.  Abdomen: Soft, non tender, non distended, + BS  Extremities: Warm and well perfused, No edema  Skin: No suspicious lesions appreciated  Psych: Normal Affect  Ancillary Information   CBC    Component Value Date/Time   WBC 6.0 04/03/2023 0939   RBC 4.02 04/03/2023 0939   HGB 11.7 (L) 04/03/2023 0939   HCT 38.0 04/03/2023 0939   PLT 246 04/03/2023 0939   MCV 94.5 04/03/2023 0939   MCH 29.1 04/03/2023 0939   MCHC 30.8 04/03/2023 0939   RDW 13.9 04/03/2023 0939   LYMPHSABS 2.0 04/03/2023 0939   MONOABS 0.6 04/03/2023 0939   EOSABS 0.2 04/03/2023 0939   BASOSABS 0.0 04/03/2023 0939    Imaging  CXR 10/23:  No active cardiopulmonary disease   CT Soft tissue neck 2024: Pharynx and larynx: No focal mucosal or submucosal lesions present. Focal calcification in the nasopharynx  is again noted. The nasopharynx is otherwise clear. The soft palate and tongue base are within normal limits. Oropharynx is unremarkable. Parapharyngeal fat is clear bilaterally. Vallecula and epiglottis are within normal limits. Aryepiglottic folds and piriform sinuses are clear. Vocal cords are midline and symmetric. Trachea is clear.     Latest Ref Rng & Units 07/04/2023    8:44 AM  PFT Results  FVC-Pre L 1.71   FVC-Predicted Pre % 67   FVC-Post L 1.91   FVC-Predicted Post % 75   Pre FEV1/FVC % % 85   Post FEV1/FCV % % 85   FEV1-Pre L 1.45   FEV1-Predicted Pre % 77   FEV1-Post L 1.63   DLCO uncorrected  ml/min/mmHg 12.66   DLCO UNC% % 67   DLVA Predicted % 81   TLC L 3.32   TLC % Predicted % 65   RV % Predicted % 52      Assessment & Plan:  Mrs. [name] is a pleasant 83 y.o female patient with a past medical history of hypertension and hypothyroidism presenting today to the pulmonary clinic for follow up on chronic cough.   #CVA Eos 200 []  continue  budesonide-formoterol [Symbicort] to 160-4.5 mcg 2 puffs BID. Inhaler training in office provided   []  c/w Albuterol 2puffs Q6h as needed.   #Moderate Restriction  Unclear reason so far ddx includes ILD vs Diaphragmatic weakness.   HRCT normal pending official read. ANA + and Anti Scl 100 positive, without any systemic symptoms we elected to follow this clinically and radiologically for now. Will hold off on referral to rheumatology or initiation of any immunosuppression.   []  Repeat PFTs   #GERD []  c/w Pantoprazole to 40mg  1 tab BID.  Return in about 3 months (around 03/25/2024) for with pfts.  I spent 30 minutes caring for this patient today, including preparing to see the patient, obtaining a medical history , reviewing a separately obtained history, performing a medically appropriate examination and/or evaluation, counseling and educating the patient/family/caregiver, ordering medications, tests, or procedures, referring and communicating with other health care professionals (not separately reported), and documenting clinical information in the electronic health record  Janann Colonel, MD [facility] 12/25/2023 1:15 PM

## 2023-12-26 ENCOUNTER — Encounter: Payer: Self-pay | Admitting: Pulmonary Disease

## 2023-12-26 DIAGNOSIS — R0602 Shortness of breath: Secondary | ICD-10-CM

## 2023-12-27 MED ORDER — ALBUTEROL SULFATE HFA 108 (90 BASE) MCG/ACT IN AERS: 2.0000 | INHALATION_SPRAY | Freq: Four times a day (QID) | RESPIRATORY_TRACT | 3 refills | Status: DC | PRN

## 2023-12-27 MED ORDER — ALBUTEROL SULFATE HFA 108 (90 BASE) MCG/ACT IN AERS: 2.0000 | INHALATION_SPRAY | Freq: Four times a day (QID) | RESPIRATORY_TRACT | 11 refills | Status: DC | PRN

## 2024-01-11 ENCOUNTER — Encounter: Payer: Self-pay | Admitting: Family

## 2024-01-11 DIAGNOSIS — I10 Essential (primary) hypertension: Secondary | ICD-10-CM

## 2024-01-12 MED ORDER — AMLODIPINE BESYLATE 5 MG PO TABS
5.0000 mg | ORAL_TABLET | Freq: Two times a day (BID) | ORAL | 3 refills | Status: DC
Start: 2024-01-12 — End: 2024-08-14

## 2024-01-14 DIAGNOSIS — M47816 Spondylosis without myelopathy or radiculopathy, lumbar region: Secondary | ICD-10-CM | POA: Diagnosis not present

## 2024-01-30 ENCOUNTER — Telehealth: Payer: Self-pay

## 2024-01-30 ENCOUNTER — Encounter: Payer: Self-pay | Admitting: Pulmonary Disease

## 2024-01-30 ENCOUNTER — Encounter: Payer: Self-pay | Admitting: Family

## 2024-01-30 ENCOUNTER — Other Ambulatory Visit (HOSPITAL_COMMUNITY): Payer: Self-pay

## 2024-01-30 DIAGNOSIS — J45991 Cough variant asthma: Secondary | ICD-10-CM

## 2024-01-30 DIAGNOSIS — R0602 Shortness of breath: Secondary | ICD-10-CM

## 2024-01-30 DIAGNOSIS — R053 Chronic cough: Secondary | ICD-10-CM

## 2024-01-30 DIAGNOSIS — E039 Hypothyroidism, unspecified: Secondary | ICD-10-CM

## 2024-01-30 MED ORDER — PANTOPRAZOLE SODIUM 40 MG PO TBEC: 40.0000 mg | DELAYED_RELEASE_TABLET | Freq: Every day | ORAL | 3 refills | Status: AC

## 2024-01-30 MED ORDER — CLOTRIMAZOLE-BETAMETHASONE 1-0.05 % EX CREA: TOPICAL_CREAM | CUTANEOUS | 2 refills | Status: DC

## 2024-01-30 MED ORDER — ALBUTEROL SULFATE HFA 108 (90 BASE) MCG/ACT IN AERS: 2.0000 | INHALATION_SPRAY | Freq: Four times a day (QID) | RESPIRATORY_TRACT | 3 refills | Status: DC | PRN

## 2024-01-30 MED ORDER — BUDESONIDE-FORMOTEROL FUMARATE 160-4.5 MCG/ACT IN AERO
2.0000 | INHALATION_SPRAY | Freq: Two times a day (BID) | RESPIRATORY_TRACT | 12 refills | Status: DC
Start: 2024-01-30 — End: 2024-01-30

## 2024-01-30 MED ORDER — LEVOTHYROXINE SODIUM 75 MCG PO TABS
75.0000 ug | ORAL_TABLET | Freq: Every day | ORAL | 3 refills | Status: AC
Start: 2024-01-30 — End: ?

## 2024-01-30 MED ORDER — ASPIRIN EC 81 MG PO TBEC: 81.0000 mg | DELAYED_RELEASE_TABLET | Freq: Every day | ORAL | 3 refills | Status: DC

## 2024-01-30 MED ORDER — ASPIRIN EC 81 MG PO TBEC
81.0000 mg | DELAYED_RELEASE_TABLET | Freq: Every day | ORAL | 3 refills | Status: DC
Start: 2024-01-30 — End: 2024-01-30

## 2024-01-30 MED ORDER — CLOTRIMAZOLE-BETAMETHASONE 1-0.05 % EX CREA: TOPICAL_CREAM | CUTANEOUS | 2 refills | Status: AC

## 2024-01-30 MED ORDER — BUDESONIDE-FORMOTEROL FUMARATE 160-4.5 MCG/ACT IN AERO
2.0000 | INHALATION_SPRAY | Freq: Two times a day (BID) | RESPIRATORY_TRACT | 12 refills | Status: AC
Start: 2024-01-30 — End: ?

## 2024-01-30 MED ORDER — ALBUTEROL SULFATE HFA 108 (90 BASE) MCG/ACT IN AERS: 2.0000 | INHALATION_SPRAY | Freq: Four times a day (QID) | RESPIRATORY_TRACT | 3 refills | Status: AC | PRN

## 2024-01-30 NOTE — Telephone Encounter (Signed)
 Medication has been sent to the [place].   Nothing further needed.

## 2024-01-30 NOTE — Telephone Encounter (Signed)
 Per patients insurance cards looks like patient only has [employer] Part D on file- per notes patient may get prescriptions through the [place]

## 2024-01-30 NOTE — Telephone Encounter (Signed)
 Spoke with pt over the phone. All of her prescriptions have been sent to [facility] per her request. Tabitha accidentally sent the ASA and cream to her local pharmacy, this has been changed.

## 2024-01-30 NOTE — Addendum Note (Signed)
 Addended by: Dewanda Foots C on: 01/30/2024 02:43 PM   Modules accepted: Orders

## 2024-01-30 NOTE — Telephone Encounter (Signed)
 Insurance cards for this patient show Part B coverage- looks like they may have to go through the [place] for prescriptions

## 2024-02-11 ENCOUNTER — Ambulatory Visit: Payer: Self-pay | Admitting: Pulmonary Disease

## 2024-03-16 ENCOUNTER — Encounter: Payer: Self-pay | Admitting: Family

## 2024-03-16 DIAGNOSIS — I10 Essential (primary) hypertension: Secondary | ICD-10-CM

## 2024-03-16 DIAGNOSIS — I2510 Atherosclerotic heart disease of native coronary artery without angina pectoris: Secondary | ICD-10-CM

## 2024-03-18 MED ORDER — SPIRONOLACTONE 25 MG PO TABS: 25.0000 mg | ORAL_TABLET | Freq: Every day | ORAL | 3 refills | Status: AC

## 2024-03-19 MED ORDER — ASPIRIN EC 81 MG PO TBEC: 81.0000 mg | DELAYED_RELEASE_TABLET | Freq: Every day | ORAL | 3 refills | Status: AC

## 2024-03-19 NOTE — Addendum Note (Signed)
 Addended by: CORWIN ANTU on: 03/19/2024 01:54 PM   Modules accepted: Orders

## 2024-03-27 ENCOUNTER — Ambulatory Visit: Admitting: Pulmonary Disease

## 2024-03-27 ENCOUNTER — Encounter: Payer: Self-pay | Admitting: Pulmonary Disease

## 2024-03-27 ENCOUNTER — Ambulatory Visit (INDEPENDENT_AMBULATORY_CARE_PROVIDER_SITE_OTHER): Admitting: Pulmonary Disease

## 2024-03-27 ENCOUNTER — Other Ambulatory Visit: Payer: Self-pay

## 2024-03-27 VITALS — BP 120/80 | HR 60 | Temp 97.8°F | Ht 65.0 in | Wt 147.2 lb

## 2024-03-27 DIAGNOSIS — J45991 Cough variant asthma: Secondary | ICD-10-CM

## 2024-03-27 DIAGNOSIS — R0602 Shortness of breath: Secondary | ICD-10-CM

## 2024-03-27 DIAGNOSIS — K219 Gastro-esophageal reflux disease without esophagitis: Secondary | ICD-10-CM

## 2024-03-27 DIAGNOSIS — J984 Other disorders of lung: Secondary | ICD-10-CM

## 2024-03-27 LAB — PULMONARY FUNCTION TEST
DL/VA % pred: 96 %
DL/VA: 3.94 ml/min/mmHg/L
DLCO unc % pred: 67 %
DLCO unc: 12.78 ml/min/mmHg
FEF 25-75 Post: 1.61 L/s
FEF 25-75 Pre: 1.64 L/s
FEF2575-%Change-Post: -2 %
FEF2575-%Pred-Post: 122 %
FEF2575-%Pred-Pre: 125 %
FEV1-%Change-Post: 0 %
FEV1-%Pred-Post: 85 %
FEV1-%Pred-Pre: 85 %
FEV1-Post: 1.65 L
FEV1-Pre: 1.66 L
FEV1FVC-%Change-Post: 3 %
FEV1FVC-%Pred-Pre: 109 %
FEV6-%Change-Post: -4 %
FEV6-%Pred-Post: 80 %
FEV6-%Pred-Pre: 84 %
FEV6-Post: 1.98 L
FEV6-Pre: 2.07 L
FEV6FVC-%Pred-Post: 106 %
FEV6FVC-%Pred-Pre: 106 %
FVC-%Change-Post: -4 %
FVC-%Pred-Post: 76 %
FVC-%Pred-Pre: 79 %
FVC-Post: 1.98 L
FVC-Pre: 2.07 L
Post FEV1/FVC ratio: 83 %
Post FEV6/FVC ratio: 100 %
Pre FEV1/FVC ratio: 80 %
Pre FEV6/FVC Ratio: 100 %
RV % pred: 73 %
RV: 1.85 L
TLC % pred: 73 %
TLC: 3.81 L

## 2024-03-27 NOTE — Progress Notes (Signed)
 Full PFT completed today ? ?

## 2024-03-27 NOTE — Progress Notes (Unsigned)
 Synopsis: Referred in by Heather Antu, FNP   Subjective:   PATIENT ID: Heather Cobb GENDER: female DOB: 1941/04/02, MRN: [id number]  Chief Complaint  Patient presents with   Follow-up    Review PFT, restrictive lung disease.   Symptoms have improved but has noticed she does cough a lot when she eats but otherwise no complaints.     HPI Heather Cobb is a pleasant 83 y.o female patient with a past medical history of hypertension and hypothyroidism presenting today to the pulmonary clinic for follow up on chronic cough.   I initially saw her back in August 2024 for a chronic cough and  was a combination of cough variant asthma and GERD for which we prescribed her symbicort  and Pantoprazole  40mg  PO Daily. She did improve initially however for the past couple of weeks, the cough has recurred. She has coughing spells with incontinence.  She saw Dr. Meade initially and was managed for GERD and Intermittent asthma she then saw Lauraine Lites on 04/12/2021. She is coming back now with persistent cough, reports it is dry and does not seem to identify a precipitating factor. She does report some improvement with albuterol  inhaler. She denies any heart burn or acid taste in the mouth. She denies any post nasal drip.   Increased her Symbicort  to 160 with some improvement noted initially however now with recurrent symptoms.   PFTs 2024 with moderate restriction and decreased DLCO. Spirometry with possible response to bronchodilators.   High res CT chest 2025 without any sign of ILD.   ANA + 1:160 Scl100 + and U1RNP.   She is doing fairly well on symbicort . Still has intermittent cough but improved on symbicort  and PPI. She has no systemic symptoms including stiff joints, rash, raynauds dysphagia.   FH: Mother and siblings with asthma.   SH: Never smoker, denies alcohol use. No pets at home.   ROS All systems were reviewed and are negative except for the above.  Objective:   Vitals:    03/27/24 0936  BP: 120/80  Pulse: 60  Temp: 97.8 F (36.6 C)  TempSrc: Oral  SpO2: 96%  Weight: 147 lb 3.2 oz (66.8 kg)  Height: 5' 5 (1.651 m)   96% on RA BMI Readings from Last 3 Encounters:  03/27/24 24.50 kg/m  03/27/24 24.12 kg/m  12/25/23 24.58 kg/m   Wt Readings from Last 3 Encounters:  03/27/24 147 lb 3.2 oz (66.8 kg)  03/27/24 147 lb 3.2 oz (66.8 kg)  12/25/23 150 lb (68 kg)    Physical Exam GEN: NAD, Healthy Appearing HEENT: Supple Neck, Reactive Pupils, EOMI  CVS: Normal S1, Normal S2, RRR, No murmurs or ES appreciated  Lungs: Clear bilateral air entry.  Abdomen: Soft, non tender, non distended, + BS  Extremities: Warm and well perfused, No edema  Skin: No suspicious lesions appreciated  Psych: Normal Affect  Ancillary Information   CBC    Component Value Date/Time   WBC 6.0 04/03/2023 0939   RBC 4.02 04/03/2023 0939   HGB 11.7 (L) 04/03/2023 0939   HCT 38.0 04/03/2023 0939   PLT 246 04/03/2023 0939   MCV 94.5 04/03/2023 0939   MCH 29.1 04/03/2023 0939   MCHC 30.8 04/03/2023 0939   RDW 13.9 04/03/2023 0939   LYMPHSABS 2.0 04/03/2023 0939   MONOABS 0.6 04/03/2023 0939   EOSABS 0.2 04/03/2023 0939   BASOSABS 0.0 04/03/2023 0939    Imaging  CXR 10/23:  No active cardiopulmonary disease   CT  Soft tissue neck 2024: Pharynx and larynx: No focal mucosal or submucosal lesions present. Focal calcification in the nasopharynx is again noted. The nasopharynx is otherwise clear. The soft palate and tongue base are within normal limits. Oropharynx is unremarkable. Parapharyngeal fat is clear bilaterally. Vallecula and epiglottis are within normal limits. Aryepiglottic folds and piriform sinuses are clear. Vocal cords are midline and symmetric. Trachea is clear.     Latest Ref Rng & Units 03/27/2024    8:45 AM 07/04/2023    8:44 AM  PFT Results  FVC-Pre L 2.07  P 1.71   FVC-Predicted Pre % 79  P 67   FVC-Post L 1.98  P 1.91   FVC-Predicted Post % 76   P 75   Pre FEV1/FVC % % 80  P 85   Post FEV1/FCV % % 83  P 85   FEV1-Pre L 1.66  P 1.45   FEV1-Predicted Pre % 85  P 77   FEV1-Post L 1.65  P 1.63   DLCO uncorrected ml/min/mmHg 12.78  P 12.66   DLCO UNC% % 67  P 67   DLVA Predicted % 96  P 81   TLC L 3.81  P 3.32   TLC % Predicted % 73  P 65   RV % Predicted % 73  P 52     P Preliminary result     Assessment & Plan:  Mrs. Colvin is a pleasant 83 y.o female patient with a past medical history of hypertension and hypothyroidism presenting today to the pulmonary clinic for follow up on chronic cough.   #CVA Eos 200 []  continue  budesonide -formoterol  [Symbicort ] to 160-4.5 mcg 2 puffs BID. Inhaler training in office provided   []  c/w Albuterol  2puffs Q6h as needed.   #Moderate Restriction  Unclear reason so far ddx includes ILD vs Diaphragmatic weakness.   HRCT normal pending official read. ANA + and Anti Scl 100 positive, without any systemic symptoms we elected to follow this clinically and radiologically for now. Will hold off on referral to rheumatology or initiation of any immunosuppression.   []  Repeat PFTs   #GERD []  c/w Pantoprazole  to 40mg  1 tab BID.  No follow-ups on file.  I spent 30 minutes caring for this patient today, including preparing to see the patient, obtaining a medical history , reviewing a separately obtained history, performing a medically appropriate examination and/or evaluation, counseling and educating the patient/family/caregiver, ordering medications, tests, or procedures, referring and communicating with other health care professionals (not separately reported), and documenting clinical information in the electronic health record  Darrin Barn, MD [facility] 03/27/2024 10:01 AM

## 2024-03-27 NOTE — Patient Instructions (Signed)
 Full PFT completed today ? ?

## 2024-04-03 ENCOUNTER — Inpatient Hospital Stay: Payer: Medicare PPO | Attending: Hematology

## 2024-04-03 DIAGNOSIS — D447 Neoplasm of uncertain behavior of aortic body and other paraganglia: Secondary | ICD-10-CM | POA: Insufficient documentation

## 2024-04-03 DIAGNOSIS — Z800 Family history of malignant neoplasm of digestive organs: Secondary | ICD-10-CM | POA: Diagnosis not present

## 2024-04-03 DIAGNOSIS — N189 Chronic kidney disease, unspecified: Secondary | ICD-10-CM | POA: Insufficient documentation

## 2024-04-03 LAB — CBC WITH DIFFERENTIAL/PLATELET
Abs Immature Granulocytes: 0.01 K/uL (ref 0.00–0.07)
Basophils Absolute: 0 K/uL (ref 0.0–0.1)
Basophils Relative: 1 %
Eosinophils Absolute: 0.2 K/uL (ref 0.0–0.5)
Eosinophils Relative: 3 %
HCT: 37.2 % (ref 36.0–46.0)
Hemoglobin: 11.9 g/dL — ABNORMAL LOW (ref 12.0–15.0)
Immature Granulocytes: 0 %
Lymphocytes Relative: 32 %
Lymphs Abs: 1.8 K/uL (ref 0.7–4.0)
MCH: 29 pg (ref 26.0–34.0)
MCHC: 32 g/dL (ref 30.0–36.0)
MCV: 90.5 fL (ref 80.0–100.0)
Monocytes Absolute: 0.6 K/uL (ref 0.1–1.0)
Monocytes Relative: 10 %
Neutro Abs: 3.1 K/uL (ref 1.7–7.7)
Neutrophils Relative %: 54 %
Platelets: 258 K/uL (ref 150–400)
RBC: 4.11 MIL/uL (ref 3.87–5.11)
RDW: 13.9 % (ref 11.5–15.5)
WBC: 5.7 K/uL (ref 4.0–10.5)
nRBC: 0 % (ref 0.0–0.2)

## 2024-04-03 LAB — COMPREHENSIVE METABOLIC PANEL WITH GFR
ALT: 10 U/L (ref 0–44)
AST: 15 U/L (ref 15–41)
Albumin: 3.8 g/dL (ref 3.5–5.0)
Alkaline Phosphatase: 73 U/L (ref 38–126)
Anion gap: 11 (ref 5–15)
BUN: 15 mg/dL (ref 8–23)
CO2: 27 mmol/L (ref 22–32)
Calcium: 10 mg/dL (ref 8.9–10.3)
Chloride: 102 mmol/L (ref 98–111)
Creatinine, Ser: 1.17 mg/dL — ABNORMAL HIGH (ref 0.44–1.00)
GFR, Estimated: 46 mL/min — ABNORMAL LOW (ref >60)
Glucose, Bld: 107 mg/dL — ABNORMAL HIGH (ref 70–99)
Potassium: 3.6 mmol/L (ref 3.5–5.1)
Sodium: 140 mmol/L (ref 135–145)
Total Bilirubin: 0.7 mg/dL (ref 0.0–1.2)
Total Protein: 7.3 g/dL (ref 6.5–8.1)

## 2024-04-04 LAB — KAPPA/LAMBDA LIGHT CHAINS
Kappa free light chain: 24.8 mg/L — ABNORMAL HIGH (ref 3.3–19.4)
Kappa, lambda light chain ratio: 1.4 (ref 0.26–1.65)
Lambda free light chains: 17.7 mg/L (ref 5.7–26.3)

## 2024-04-05 ENCOUNTER — Encounter: Payer: Self-pay | Admitting: Family Medicine

## 2024-04-05 ENCOUNTER — Ambulatory Visit (INDEPENDENT_AMBULATORY_CARE_PROVIDER_SITE_OTHER): Admitting: Family Medicine

## 2024-04-05 VITALS — BP 132/60 | HR 64 | Temp 97.5°F | Ht 65.0 in | Wt 143.0 lb

## 2024-04-05 DIAGNOSIS — R8290 Unspecified abnormal findings in urine: Secondary | ICD-10-CM | POA: Insufficient documentation

## 2024-04-05 LAB — POC URINALSYSI DIPSTICK (AUTOMATED)
Bilirubin, UA: NEGATIVE
Blood, UA: 25
Glucose, UA: NEGATIVE
Ketones, UA: 5
Nitrite, UA: POSITIVE
Protein, UA: NEGATIVE
Spec Grav, UA: 1.015 (ref 1.010–1.025)
Urobilinogen, UA: 0.2 U/dL
pH, UA: 5.5 (ref 5.0–8.0)

## 2024-04-05 LAB — PROTEIN ELECTROPHORESIS, SERUM
A/G Ratio: 1.2 (ref 0.7–1.7)
Albumin ELP: 3.7 g/dL (ref 2.9–4.4)
Alpha-1-Globulin: 0.3 g/dL (ref 0.0–0.4)
Alpha-2-Globulin: 0.8 g/dL (ref 0.4–1.0)
Beta Globulin: 1 g/dL (ref 0.7–1.3)
Gamma Globulin: 1 g/dL (ref 0.4–1.8)
Globulin, Total: 3.1 g/dL (ref 2.2–3.9)
Total Protein ELP: 6.8 g/dL (ref 6.0–8.5)

## 2024-04-05 LAB — POCT UA - MICROSCOPIC ONLY

## 2024-04-05 MED ORDER — MUPIROCIN 2 % EX OINT: 1.0000 | TOPICAL_OINTMENT | Freq: Two times a day (BID) | CUTANEOUS | 0 refills | Status: DC

## 2024-04-05 NOTE — Progress Notes (Signed)
 Patient ID: Heather Cobb, female    DOB: Nov 02, 1940, 83 y.o.   MRN: [id number]  This visit was conducted in person.  BP 132/60   Pulse 64   Temp (!) 97.5 F (36.4 C) (Oral)   Ht 5' 5 (1.651 m)   Wt 143 lb (64.9 kg)   SpO2 96%   BMI 23.80 kg/m    CC:  Chief Complaint  Patient presents with   urinary odor    X 2 weeks. Denies any other sx.    Subjective:   HPI: Heather Cobb is a 83 y.o. female patient of [facility] with history of hypertension, chronic kidney disease, prediabetes presenting on 04/05/2024 for urinary odor (X 2 weeks. Denies any other sx.)  She reports new onset change in urinary odor for the past 2 weeks.  She denies burning with urination, no blood in urine, no change in urinary frequency or urgency. No fever. No abdominal pain.   Occ pain on left lower back.   Most recent urinary tract infection October 31, 2023 E. coli, resistant to Cipro  and levofloxacin,  Status post treat with cephalexin  500 mg p.o. twice daily x 7 days  Moderate water intake.      Relevant past medical, surgical, family and social history reviewed and updated as indicated. Interim medical history since our last visit reviewed. Allergies and medications reviewed and updated. Outpatient Medications Prior to Visit  Medication Sig Dispense Refill   albuterol  (VENTOLIN  HFA) 108 (90 Base) MCG/ACT inhaler Inhale 2 puffs into the lungs every 6 (six) hours as needed for wheezing or shortness of breath. 3 each 3   amLODipine  (NORVASC ) 5 MG tablet Take 1 tablet (5 mg total) by mouth 2 (two) times daily. 180 tablet 3   aspirin  EC 81 MG tablet Take 1 tablet (81 mg total) by mouth daily. 90 tablet 3   budesonide -formoterol  (SYMBICORT ) 160-4.5 MCG/ACT inhaler Inhale 2 puffs into the lungs in the morning and at bedtime. 1 each 12   cholecalciferol (VITAMIN D) 1000 UNITS tablet Take 1,000 Units by mouth daily.     clotrimazole -betamethasone  (LOTRISONE ) cream APPLY TOPICALLY TO THE AFFECTED  AREA TWICE DAILY 30 g 2   DULoxetine  (CYMBALTA ) 30 MG capsule Take 1 capsule (30 mg total) by mouth daily. 90 capsule 3   famotidine  (PEPCID ) 20 MG tablet Take 1 tablet (20 mg total) by mouth 2 (two) times daily. 180 tablet 3   ferrous sulfate 325 (65 FE) MG tablet Take 325 mg by mouth daily with breakfast.     fluticasone  (FLONASE ) 50 MCG/ACT nasal spray Place 2 sprays into both nostrils daily. 16 g 6   levothyroxine  (SYNTHROID ) 75 MCG tablet Take 1 tablet (75 mcg total) by mouth daily before breakfast. 90 tablet 3   linaclotide  (LINZESS ) 145 MCG CAPS capsule Take 1 capsule (145 mcg total) by mouth daily as needed (for constipation). 90 capsule 1   loratadine  (CLARITIN ) 10 MG tablet Take 1 tablet (10 mg total) by mouth daily. 90 tablet 3   methocarbamol  (ROBAXIN -750) 750 MG tablet Take 1 tablet (750 mg total) by mouth every 12 (twelve) hours as needed for muscle spasms. 60 tablet 3   olmesartan  (BENICAR ) 40 MG tablet TAKE 1 TABLET(40 MG) BY MOUTH DAILY 90 tablet 3   pantoprazole  (PROTONIX ) 40 MG tablet Take 1 tablet (40 mg total) by mouth daily. 90 tablet 3   Spacer/Aero-Holding Chambers (AEROCHAMBER HOLDING CHAMBER) DEVI 1 each by Does not apply route daily. 1  each 0   spironolactone  (ALDACTONE ) 25 MG tablet Take 1 tablet (25 mg total) by mouth daily. For blood pressure. 90 tablet 3   vitamin B-12 (CYANOCOBALAMIN ) 1000 MCG tablet Take 500 mcg by mouth daily.     No facility-administered medications prior to visit.     Per HPI unless specifically indicated in ROS section below Review of Systems  Constitutional:  Negative for fatigue and fever.  HENT:  Negative for congestion.   Eyes:  Negative for pain.  Respiratory:  Negative for cough and shortness of breath.   Cardiovascular:  Negative for chest pain, palpitations and leg swelling.  Gastrointestinal:  Negative for abdominal pain.  Genitourinary:  Negative for dysuria and vaginal bleeding.  Musculoskeletal:  Negative for back pain.   Neurological:  Negative for syncope, light-headedness and headaches.  Psychiatric/Behavioral:  Negative for dysphoric mood.    Objective:  BP 132/60   Pulse 64   Temp (!) 97.5 F (36.4 C) (Oral)   Ht 5' 5 (1.651 m)   Wt 143 lb (64.9 kg)   SpO2 96%   BMI 23.80 kg/m   Wt Readings from Last 3 Encounters:  04/05/24 143 lb (64.9 kg)  03/27/24 147 lb 3.2 oz (66.8 kg)  03/27/24 147 lb 3.2 oz (66.8 kg)      Physical Exam Constitutional:      General: She is not in acute distress.    Appearance: Normal appearance. She is well-developed. She is not ill-appearing or toxic-appearing.  HENT:     Head: Normocephalic.     Right Ear: Hearing, tympanic membrane, ear canal and external ear normal. Tympanic membrane is not erythematous, retracted or bulging.     Left Ear: Hearing, tympanic membrane, ear canal and external ear normal. Tympanic membrane is not erythematous, retracted or bulging.     Nose: No mucosal edema or rhinorrhea.     Right Sinus: No maxillary sinus tenderness or frontal sinus tenderness.     Left Sinus: No maxillary sinus tenderness or frontal sinus tenderness.     Mouth/Throat:     Pharynx: Uvula midline.  Eyes:     General: Lids are normal. Lids are everted, no foreign bodies appreciated.     Conjunctiva/sclera: Conjunctivae normal.     Pupils: Pupils are equal, round, and reactive to light.  Neck:     Thyroid : No thyroid  mass or thyromegaly.     Vascular: No carotid bruit.     Trachea: Trachea normal.  Cardiovascular:     Rate and Rhythm: Normal rate and regular rhythm.     Pulses: Normal pulses.     Heart sounds: Normal heart sounds, S1 normal and S2 normal. No murmur heard.    No friction rub. No gallop.  Pulmonary:     Effort: Pulmonary effort is normal. No tachypnea or respiratory distress.     Breath sounds: Normal breath sounds. No decreased breath sounds, wheezing, rhonchi or rales.  Abdominal:     General: Bowel sounds are normal.     Palpations:  Abdomen is soft.     Tenderness: There is no abdominal tenderness. There is no right CVA tenderness or left CVA tenderness.  Musculoskeletal:     Cervical back: Normal range of motion and neck supple.  Skin:    General: Skin is warm and dry.     Findings: No rash.  Neurological:     Mental Status: She is alert.  Psychiatric:        Mood and Affect: Mood is  not anxious or depressed.        Speech: Speech normal.        Behavior: Behavior normal. Behavior is cooperative.        Thought Content: Thought content normal.        Judgment: Judgment normal.       Results for orders placed or performed in visit on 04/05/24  POCT Urinalysis Dipstick (Automated)   Collection Time: 04/05/24  2:23 PM  Result Value Ref Range   Color, UA light yellow    Clarity, UA clear    Glucose, UA Negative Negative   Bilirubin, UA neg    Ketones, UA 5 mg/dL    Spec Grav, UA 8.984 1.010 - 1.025   Blood, UA 25 ery/uL    pH, UA 5.5 5.0 - 8.0   Protein, UA Negative Negative   Urobilinogen, UA 0.2 0.2 or 1.0 E.U./dL   Nitrite, UA positive    Leukocytes, UA Small (1+) (A) Negative  POCT UA - Microscopic Only   Collection Time: 04/05/24  2:50 PM  Result Value Ref Range   WBC, Ur, HPF, POC 10-15 0 - 5   RBC, Urine, Miroscopic none 0 - 2   Bacteria, U Microscopic     Mucus, UA     Epithelial cells, urine per micros many    Crystals, Ur, HPF, POC     Casts, Ur, LPF, POC none    Yeast, UA      Assessment and Plan  Bad odor of urine Assessment & Plan: Acute, minimal symptoms.  UA concerning for infection but   Urine MIcro  shows contamination  Send urine for culture.  We will let her know next week if culture returns positive for possible course of antibiotics Push fluids and start cranberry tablets.  Follow-up if not improving as expected call ASAP if fever or flank pain.  Orders: -     POCT Urinalysis Dipstick (Automated) -     Urine Culture -     POCT UA - Microscopic Only    No  follow-ups on file.   Greig Ring, MD

## 2024-04-05 NOTE — Assessment & Plan Note (Addendum)
 Acute, minimal symptoms.  UA concerning for infection but   Urine MIcro  shows contamination  Send urine for culture.  We will let her know next week if culture returns positive for possible course of antibiotics Push fluids and start cranberry tablets.  Follow-up if not improving as expected call ASAP if fever or flank pain.

## 2024-04-07 LAB — URINE CULTURE
MICRO NUMBER:: 16717677
SPECIMEN QUALITY:: ADEQUATE

## 2024-04-08 ENCOUNTER — Ambulatory Visit: Payer: Self-pay | Admitting: Family Medicine

## 2024-04-08 ENCOUNTER — Other Ambulatory Visit: Payer: Self-pay | Admitting: Family Medicine

## 2024-04-08 MED ORDER — SULFAMETHOXAZOLE-TRIMETHOPRIM 800-160 MG PO TABS: 1.0000 | ORAL_TABLET | Freq: Two times a day (BID) | ORAL | 0 refills | Status: DC

## 2024-04-08 MED ORDER — SULFAMETHOXAZOLE-TRIMETHOPRIM 800-160 MG PO TABS
1.0000 | ORAL_TABLET | Freq: Two times a day (BID) | ORAL | 0 refills | Status: DC
Start: 2024-04-08 — End: 2024-04-08

## 2024-04-10 ENCOUNTER — Inpatient Hospital Stay: Payer: Medicare PPO | Admitting: Hematology

## 2024-04-15 ENCOUNTER — Inpatient Hospital Stay (HOSPITAL_BASED_OUTPATIENT_CLINIC_OR_DEPARTMENT_OTHER): Admitting: Hematology

## 2024-04-15 VITALS — BP 104/53 | HR 50 | Temp 97.7°F | Resp 17 | Ht 64.0 in | Wt 143.0 lb

## 2024-04-15 DIAGNOSIS — Z8603 Personal history of neoplasm of uncertain behavior: Secondary | ICD-10-CM

## 2024-04-15 DIAGNOSIS — Z08 Encounter for follow-up examination after completed treatment for malignant neoplasm: Secondary | ICD-10-CM | POA: Diagnosis not present

## 2024-04-15 DIAGNOSIS — Z800 Family history of malignant neoplasm of digestive organs: Secondary | ICD-10-CM | POA: Diagnosis not present

## 2024-04-15 DIAGNOSIS — D447 Neoplasm of uncertain behavior of aortic body and other paraganglia: Secondary | ICD-10-CM | POA: Diagnosis not present

## 2024-04-15 DIAGNOSIS — N189 Chronic kidney disease, unspecified: Secondary | ICD-10-CM | POA: Diagnosis not present

## 2024-04-15 NOTE — Progress Notes (Signed)
 [facility] [address], [place] [address]    Clinic Day:  04/11/2023  Referring physician: Corwin Antu, FNP  Patient Care Team: Corwin Antu, FNP as PCP - General (Family Medicine) Deveshwar, Sanjeev, MD as Consulting Physician (Interventional Radiology)   ASSESSMENT & PLAN:   Assessment: 1.  Paraganglioma of the left carotid artery: -Left carotid body biopsy on 10/27/2017 shows paraganglioma.  Status was not mentioned. -Work-up with 24-hour urine metanephrines and normetanephrine's were within normal limits. -Genetic testing for SDH and BHL was negative.  It showed VUS in KIT and STK11. -CT soft tissue neck on 03/27/2020 did not show any evidence of recurrence or residual carotid body tumor.  Plan: 1.  Paraganglioma of the left carotid artery: - CT scan of the neck from 04/03/2023 with no evidence of recurrence. - She denies any dysphagia or odynophagia. - Physical exam: No palpable masses or adenopathy. - Labs from 04/03/2024 were reviewed with no significant changes.  I have recommended follow-up on an as-needed basis.   2.  CKD: - Creatinine is 1.17 and stable.   3.  Spine lesions: - MRI of the lumbar spine (02/18/2022): Marrow signal abnormalities T12, L1, L4 and L5.  Probably representing atypical hemangiomas. - MRI lumbar spine on 05/24/2022: Unchanged abnormal marrow signal at several levels, most consistent with vertebral body hemangiomas. - She does not report any back pains.  We have reviewed myeloma labs from 04/03/2024 which were negative.  No further workup is needed.  No orders of the defined types were placed in this encounter.     Heather Cobb,acting as a Neurosurgeon for Alean Stands, MD.,have documented all relevant documentation on the behalf of Alean Stands, MD,as directed by  Alean Stands, MD while in the presence of Alean Stands, MD.  I, Alean Stands MD, have reviewed the above documentation for  accuracy and completeness, and I agree with the above.    Alean Stands, MD   7/28/20252:06 PM  CHIEF COMPLAINT:   Diagnosis: paraganglioma    Cancer Staging  No matching staging information was found for the patient.    Prior Therapy: Excision of left carotid body tumor on 10/27/2017   Current Therapy:  surveillance   HISTORY OF PRESENT ILLNESS:   Oncology History   No history exists.     INTERVAL HISTORY:   Heather Cobb is a 83 y.o. female presenting to clinic today for follow up of paraganglioma. She was last seen by me on 04/11/2023.  Today, she states that she is doing well overall. Her appetite level is at 100%. Her energy level is at 100%.  PAST MEDICAL HISTORY:   Past Medical History: Past Medical History:  Diagnosis Date   Anemia    Carotid body tumor (HCC)    left   Family history of colon cancer    Hypertension    Hypothyroid    Pyelonephritis    Rectal bleeding 11/26/2015   Vertigo    Vitamin B 12 deficiency     Surgical History: Past Surgical History:  Procedure Laterality Date   ABDOMINAL HYSTERECTOMY     CATARACT EXTRACTION W/ INTRAOCULAR LENS  IMPLANT, BILATERAL     CHOLECYSTECTOMY N/A 04/15/2015   Procedure: LAPAROSCOPIC CHOLECYSTECTOMY;  Surgeon: Oneil Budge, MD;  Location: [facility];  Service: General;  Laterality: N/A;   COLONOSCOPY  2012   [facility]   COLONOSCOPY WITH PROPOFOL  N/A 02/03/2016   Procedure: COLONOSCOPY WITH PROPOFOL ;  Surgeon: Reyes LELON Cota, MD;  Location: [facility];  Service: Endoscopy;  Laterality: N/A;   EYE SURGERY Bilateral    cataract extractions   HEMORRHOID SURGERY N/A 08/31/2018   Procedure: HEMORRHOIDECTOMY;  Surgeon: Dessa Reyes ORN, MD;  Location: [facility];  Service: General;  Laterality: N/A;   IR ANGIO EXTERNAL CAROTID SEL EXT CAROTID UNI L MOD SED  10/26/2017   IR ANGIO INTRA EXTRACRAN SEL COM CAROTID INNOMINATE BILAT MOD SED  10/26/2017   IR ANGIO VERTEBRAL SEL VERTEBRAL UNI R MOD SED  10/26/2017   IR  ANGIOGRAM EXTREMITY LEFT  10/26/2017   IR ANGIOGRAM FOLLOW UP STUDY  10/26/2017   IR NEURO EACH ADD'L AFTER BASIC UNI LEFT (MS)  10/26/2017   IR RADIOLOGIST EVAL & MGMT  09/21/2017   IR TRANSCATH/EMBOLIZ  10/26/2017   MASS EXCISION Left 10/27/2017   Procedure: EXCISION CAROTID BODY TUMOR;  Surgeon: Carlie Clark, MD;  Location: [facility] OR;  Service: ENT;  Laterality: Left;   RADIOLOGY WITH ANESTHESIA N/A 10/26/2017   Procedure: EMBOLIZATION;  Surgeon: Dolphus Carrion, MD;  Location: [facility] OR;  Service: Radiology;  Laterality: N/A;   SIGMOIDOSCOPY N/A 08/31/2018   Procedure: SIGMOIDOSCOPY-IN  OR;  Surgeon: Dessa Reyes ORN, MD;  Location: [facility];  Service: General;  Laterality: N/A;   UPPER GASTROINTESTINAL ENDOSCOPY  2014   VESICOVAGINAL FISTULA CLOSURE W/ TAH      Social History: Social History   Socioeconomic History   Marital status: Widowed    Spouse name: Not on file   Number of children: 1   Years of education: high school   Highest education level: 12th grade  Occupational History   Occupation: Scientist, product/process development    Comment: retired  Tobacco Use   Smoking status: Never   Smokeless tobacco: Never  Vaping Use   Vaping status: Never Used  Substance and Sexual Activity   Alcohol use: No    Alcohol/week: 0.0 standard drinks of alcohol   Drug use: No   Sexual activity: Not Currently    Birth control/protection: Surgical  Other Topics Concern   Not on file  Social History Narrative   10/15/19   From: the area   Living: with Daugher Beacher) and son-in-law   Work: retired from [employer] work, 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      Family: lives with daughter, has 1 grandson and 3 great-grandchildren and 1 great-great-grandchild      Enjoys: eat, dance, travel, cooking      Exercise: walk - not as much in cold, will do 1 mile/day   Diet: good, tries to eat health      Safety   Seat belts: Yes    Guns: Yes  and secure   Safe in relationships: Yes    Social Drivers of Health   Financial  Resource Strain: Low Risk  (10/30/2023)   Overall Financial Resource Strain (CARDIA)    Difficulty of Paying Living Expenses: Not hard at all  Food Insecurity: No Food Insecurity (10/30/2023)   Hunger Vital Sign    Worried About Running Out of Food in the Last Year: Never true    Ran Out of Food in the Last Year: Never true  Transportation Needs: No Transportation Needs (10/30/2023)   PRAPARE - Administrator, Civil Service (Medical): No    Lack of Transportation (Non-Medical): No  Physical Activity: Inactive (10/30/2023)   Exercise Vital Sign    Days of Exercise per Week: 0 days    Minutes of Exercise per Session: 30 min  Stress: No  Stress Concern Present (10/30/2023)   [employer] of Occupational Health - Occupational Stress Questionnaire    Feeling of Stress : Not at all  Social Connections: Moderately Integrated (10/30/2023)   Social Connection and Isolation Panel    Frequency of Communication with Friends and Family: More than three times a week    Frequency of Social Gatherings with Friends and Family: Once a week    Attends Religious Services: More than 4 times per year    Active Member of [employer] or Organizations: Yes    Attends Banker Meetings: More than 4 times per year    Marital Status: Widowed  Intimate Partner Violence: Not At Risk (03/06/2023)   Humiliation, Afraid, Rape, and Kick questionnaire    Fear of Current or Ex-Partner: No    Emotionally Abused: No    Physically Abused: No    Sexually Abused: No    Family History: Family History  Problem Relation Age of Onset   Asthma Mother    Kidney failure Mother    Hypertension Daughter    Colon cancer Father        dx in his 45s   Stomach cancer Sister        stage 4   Colon cancer Maternal Uncle    Heart attack Maternal Grandmother    Asthma Brother    Cancer Neg Hx     Current Medications:  Current Outpatient Medications:    albuterol  (VENTOLIN  HFA) 108 (90 Base) MCG/ACT inhaler,  Inhale 2 puffs into the lungs every 6 (six) hours as needed for wheezing or shortness of breath., Disp: 3 each, Rfl: 3   amLODipine  (NORVASC ) 5 MG tablet, Take 1 tablet (5 mg total) by mouth 2 (two) times daily., Disp: 180 tablet, Rfl: 3   aspirin  EC 81 MG tablet, Take 1 tablet (81 mg total) by mouth daily., Disp: 90 tablet, Rfl: 3   budesonide -formoterol  (SYMBICORT ) 160-4.5 MCG/ACT inhaler, Inhale 2 puffs into the lungs in the morning and at bedtime., Disp: 1 each, Rfl: 12   cholecalciferol (VITAMIN D) 1000 UNITS tablet, Take 1,000 Units by mouth daily., Disp: , Rfl:    clotrimazole -betamethasone  (LOTRISONE ) cream, APPLY TOPICALLY TO THE AFFECTED AREA TWICE DAILY, Disp: 30 g, Rfl: 2   DULoxetine  (CYMBALTA ) 30 MG capsule, Take 1 capsule (30 mg total) by mouth daily., Disp: 90 capsule, Rfl: 3   famotidine  (PEPCID ) 20 MG tablet, Take 1 tablet (20 mg total) by mouth 2 (two) times daily., Disp: 180 tablet, Rfl: 3   ferrous sulfate 325 (65 FE) MG tablet, Take 325 mg by mouth daily with breakfast., Disp: , Rfl:    fluticasone  (FLONASE ) 50 MCG/ACT nasal spray, Place 2 sprays into both nostrils daily., Disp: 16 g, Rfl: 6   levothyroxine  (SYNTHROID ) 75 MCG tablet, Take 1 tablet (75 mcg total) by mouth daily before breakfast., Disp: 90 tablet, Rfl: 3   linaclotide  (LINZESS ) 145 MCG CAPS capsule, Take 1 capsule (145 mcg total) by mouth daily as needed (for constipation)., Disp: 90 capsule, Rfl: 1   loratadine  (CLARITIN ) 10 MG tablet, Take 1 tablet (10 mg total) by mouth daily., Disp: 90 tablet, Rfl: 3   methocarbamol  (ROBAXIN -750) 750 MG tablet, Take 1 tablet (750 mg total) by mouth every 12 (twelve) hours as needed for muscle spasms., Disp: 60 tablet, Rfl: 3   olmesartan  (BENICAR ) 40 MG tablet, TAKE 1 TABLET(40 MG) BY MOUTH DAILY, Disp: 90 tablet, Rfl: 3   pantoprazole  (PROTONIX ) 40 MG tablet, Take 1 tablet (  40 mg total) by mouth daily., Disp: 90 tablet, Rfl: 3   Spacer/Aero-Holding Chambers (AEROCHAMBER  HOLDING CHAMBER) DEVI, 1 each by Does not apply route daily., Disp: 1 each, Rfl: 0   spironolactone  (ALDACTONE ) 25 MG tablet, Take 1 tablet (25 mg total) by mouth daily. For blood pressure., Disp: 90 tablet, Rfl: 3   sulfamethoxazole -trimethoprim  (BACTRIM  DS) 800-160 MG tablet, Take 1 tablet by mouth 2 (two) times daily., Disp: 14 tablet, Rfl: 0   vitamin B-12 (CYANOCOBALAMIN ) 1000 MCG tablet, Take 500 mcg by mouth daily., Disp: , Rfl:    Allergies: Allergies  Allergen Reactions   Codeine Nausea Only and Other (See Comments)    fever fever fever    REVIEW OF SYSTEMS:   Review of Systems  Constitutional:  Negative for chills, fatigue and fever.  HENT:   Negative for lump/mass, mouth sores, nosebleeds, sore throat and trouble swallowing.   Eyes:  Negative for eye problems.  Respiratory:  Positive for cough. Negative for shortness of breath.   Cardiovascular:  Negative for chest pain, leg swelling and palpitations.  Gastrointestinal:  Positive for constipation. Negative for abdominal pain, diarrhea, nausea and vomiting.  Genitourinary:  Negative for bladder incontinence, difficulty urinating, dysuria, frequency, hematuria and nocturia.   Musculoskeletal:  Negative for arthralgias, back pain, flank pain, myalgias and neck pain.  Skin:  Negative for itching and rash.  Neurological:  Negative for dizziness, headaches and numbness.  Hematological:  Does not bruise/bleed easily.  Psychiatric/Behavioral:  Negative for depression, sleep disturbance and suicidal ideas. The patient is not nervous/anxious.   All other systems reviewed and are negative.    VITALS:   Blood pressure (!) 104/53, pulse (!) 50, temperature 97.7 F (36.5 C), temperature source Tympanic, resp. rate 17, height 5' 4 (1.626 m), weight 143 lb (64.9 kg), SpO2 98%.  Wt Readings from Last 3 Encounters:  04/15/24 143 lb (64.9 kg)  04/05/24 143 lb (64.9 kg)  03/27/24 147 lb 3.2 oz (66.8 kg)    Body mass index is 24.55  kg/m.  Performance status (ECOG): 1 - Symptomatic but completely ambulatory  PHYSICAL EXAM:   Physical Exam Vitals and nursing note reviewed. Exam conducted with a chaperone present.  Constitutional:      Appearance: Normal appearance.  Cardiovascular:     Rate and Rhythm: Normal rate and regular rhythm.     Pulses: Normal pulses.     Heart sounds: Normal heart sounds.  Pulmonary:     Effort: Pulmonary effort is normal.     Breath sounds: Normal breath sounds.  Abdominal:     Palpations: Abdomen is soft. There is no hepatomegaly, splenomegaly or mass.     Tenderness: There is no abdominal tenderness.  Musculoskeletal:     Right lower leg: No edema.     Left lower leg: No edema.  Lymphadenopathy:     Cervical: No cervical adenopathy.     Right cervical: No superficial, deep or posterior cervical adenopathy.    Left cervical: No superficial, deep or posterior cervical adenopathy.     Upper Body:     Right upper body: No supraclavicular or axillary adenopathy.     Left upper body: No supraclavicular or axillary adenopathy.  Neurological:     General: No focal deficit present.     Mental Status: She is alert and oriented to person, place, and time.  Psychiatric:        Mood and Affect: Mood normal.        Behavior: Behavior  normal.     LABS:      Latest Ref Rng & Units 04/03/2024   11:01 AM 04/03/2023    9:39 AM 09/29/2022   10:04 AM  CBC  WBC 4.0 - 10.5 K/uL 5.7  6.0  5.0   Hemoglobin 12.0 - 15.0 g/dL 88.0  88.2  87.1   Hematocrit 36.0 - 46.0 % 37.2  38.0  38.2   Platelets 150 - 400 K/uL 258  246  244.0       Latest Ref Rng & Units 04/03/2024   11:01 AM 08/16/2023    9:50 AM 09/29/2022   10:04 AM  CMP  Glucose 70 - 99 mg/dL 892  90  95   BUN 8 - 23 mg/dL 15  16  16    Creatinine 0.44 - 1.00 mg/dL 8.82  8.64  8.87   Sodium 135 - 145 mmol/L 140  142  145   Potassium 3.5 - 5.1 mmol/L 3.6  4.0  3.7   Chloride 98 - 111 mmol/L 102  105  105   CO2 22 - 32 mmol/L 27   31  33   Calcium 8.9 - 10.3 mg/dL 89.9  89.8  89.7   Total Protein 6.5 - 8.1 g/dL 7.3   6.9   Total Bilirubin 0.0 - 1.2 mg/dL 0.7   0.6   Alkaline Phos 38 - 126 U/L 73   73   AST 15 - 41 U/L 15   12   ALT 0 - 44 U/L 10   7      No results found for: CEA1, CEA / No results found for: CEA1, CEA No results found for: PSA1 No results found for: CAN199 No results found for: RJW874  Lab Results  Component Value Date   TOTALPROTELP 6.8 04/03/2024   ALBUMINELP 3.7 04/03/2024   A1GS 0.3 04/03/2024   A2GS 0.8 04/03/2024   BETS 1.0 04/03/2024   GAMS 1.0 04/03/2024   MSPIKE Not Observed 04/03/2024   SPEI Comment 04/03/2024   Lab Results  Component Value Date   TIBC 238 (L) 02/02/2013   FERRITIN 121 02/02/2013   IRONPCTSAT 6 (L) 02/02/2013   Lab Results  Component Value Date   LDH 109 (L) 06/21/2022     STUDIES:   No results found.

## 2024-04-15 NOTE — Patient Instructions (Addendum)
 [facility] at [facility] Discharge Instructions   You were seen and examined today by Dr. Rogers.  He reviewed the results of your lab work which are normal/stable.   We will see you back as needed.   Return as needed if any issues should arise in the future.     Thank you for choosing [facility] at [facility] to provide your oncology and hematology care.  To afford each patient quality time with our provider, please arrive at least 15 minutes before your scheduled appointment time.   If you have a lab appointment with the Cancer Center please come in thru the Main Entrance and check in at the main information desk.  You need to re-schedule your appointment should you arrive 10 or more minutes late.  We strive to give you quality time with our providers, and arriving late affects you and other patients whose appointments are after yours.  Also, if you no show three or more times for appointments you may be dismissed from the clinic at the providers discretion.     Again, thank you for choosing [facility]  Our hope is that these requests will decrease the amount of time that you wait before being seen by our physicians.       _____________________________________________________________  Should you have questions after your visit to [facility], please contact our office at [phone] and follow the prompts.  Our office hours are 8:00 a.m. and 4:30 p.m. Monday - Friday.  Please note that voicemails left after 4:00 p.m. may not be returned until the following business day.  We are closed weekends and major holidays.  You do have access to a nurse 24-7, just call the main number to the clinic [phone] and do not press any options, hold on the line and a nurse will answer the phone.    For prescription refill requests, have your pharmacy contact our office and allow 72 hours.    Due to Covid, you will need to  wear a mask upon entering the hospital. If you do not have a mask, a mask will be given to you at the Main Entrance upon arrival. For doctor visits, patients may have 1 support person age 59 or older with them. For treatment visits, patients can not have anyone with them due to social distancing guidelines and our immunocompromised population.

## 2024-06-10 ENCOUNTER — Ambulatory Visit: Admitting: Nurse Practitioner

## 2024-06-11 ENCOUNTER — Ambulatory Visit (INDEPENDENT_AMBULATORY_CARE_PROVIDER_SITE_OTHER): Admitting: Family

## 2024-06-11 ENCOUNTER — Encounter: Payer: Self-pay | Admitting: Family

## 2024-06-11 ENCOUNTER — Ambulatory Visit: Payer: Self-pay | Admitting: Family

## 2024-06-11 VITALS — BP 122/66 | HR 53 | Temp 98.0°F | Ht 65.5 in | Wt 142.0 lb

## 2024-06-11 DIAGNOSIS — E538 Deficiency of other specified B group vitamins: Secondary | ICD-10-CM | POA: Diagnosis not present

## 2024-06-11 DIAGNOSIS — B9689 Other specified bacterial agents as the cause of diseases classified elsewhere: Secondary | ICD-10-CM | POA: Diagnosis not present

## 2024-06-11 DIAGNOSIS — R82998 Other abnormal findings in urine: Secondary | ICD-10-CM | POA: Diagnosis not present

## 2024-06-11 DIAGNOSIS — R7303 Prediabetes: Secondary | ICD-10-CM | POA: Diagnosis not present

## 2024-06-11 DIAGNOSIS — R051 Acute cough: Secondary | ICD-10-CM

## 2024-06-11 DIAGNOSIS — R8290 Unspecified abnormal findings in urine: Secondary | ICD-10-CM

## 2024-06-11 DIAGNOSIS — J22 Unspecified acute lower respiratory infection: Secondary | ICD-10-CM

## 2024-06-11 DIAGNOSIS — D631 Anemia in chronic kidney disease: Secondary | ICD-10-CM | POA: Diagnosis not present

## 2024-06-11 DIAGNOSIS — I2510 Atherosclerotic heart disease of native coronary artery without angina pectoris: Secondary | ICD-10-CM | POA: Diagnosis not present

## 2024-06-11 DIAGNOSIS — E039 Hypothyroidism, unspecified: Secondary | ICD-10-CM | POA: Diagnosis not present

## 2024-06-11 DIAGNOSIS — I10 Essential (primary) hypertension: Secondary | ICD-10-CM | POA: Diagnosis not present

## 2024-06-11 DIAGNOSIS — N1832 Chronic kidney disease, stage 3b: Secondary | ICD-10-CM | POA: Diagnosis not present

## 2024-06-11 LAB — POCT URINALYSIS DIP (CLINITEK)
Bilirubin, UA: NEGATIVE
Glucose, UA: NEGATIVE mg/dL
Nitrite, UA: POSITIVE — AB
Spec Grav, UA: 1.02 (ref 1.010–1.025)
Urobilinogen, UA: 0.2 U/dL
pH, UA: 6 (ref 5.0–8.0)

## 2024-06-11 MED ORDER — AMOXICILLIN-POT CLAVULANATE 875-125 MG PO TABS: 1.0000 | ORAL_TABLET | Freq: Two times a day (BID) | ORAL | 0 refills | Status: DC

## 2024-06-11 MED ORDER — BENZONATATE 200 MG PO CAPS: 200.0000 mg | ORAL_CAPSULE | Freq: Two times a day (BID) | ORAL | 0 refills | Status: DC | PRN

## 2024-06-11 MED ORDER — PREDNISONE 20 MG PO TABS: ORAL_TABLET | ORAL | 0 refills | Status: AC

## 2024-06-11 MED ORDER — DOXYCYCLINE HYCLATE 100 MG PO TABS: 100.0000 mg | ORAL_TABLET | Freq: Two times a day (BID) | ORAL | 0 refills | Status: DC

## 2024-06-11 NOTE — Progress Notes (Signed)
 "  Established Patient Office Visit  Subjective:      CC:  Chief Complaint  Patient presents with   Acute Visit    Reports congestion and cough x5 days. Tested at home for COVID and it was negative. She also has foul smelling urine.    HPI: Heather Cobb is a 83 y.o. female presenting on 06/11/2024 for Acute Visit (Reports congestion and cough x5 days. Tested at home for COVID and it was negative. She also has foul smelling urine.) .  Discussed the use of AI scribe software for clinical note transcription with the patient, who gave verbal consent to proceed.  History of Present Illness Heather Cobb is an 83 year old female who presents with a persistent cough and wheezing following a recent cold.  She began experiencing symptoms of a cold six days ago, with the worst day being Saturday, including a fever of 99.33F managed with Advil. Her symptoms have since improved.  She has a persistent productive cough and wheezing, which is typical for her during colds. She uses her inhaler approximately four times a day to manage the wheezing, which she describes as audible but not tight. She also takes plain Mucinex (guaifenesin) and has previously used a nebulizer.  She mentions a recent large gathering for her birthday, which she believes may have contributed to her current illness due to increased exposure and feeling tired from the event.  She reports a change in the odor of her urine since having the cold but denies any pain, burning, or back pain associated with urination. No increased frequency or urgency of urination is noted.  Her current medications include benzonatate  for cough, which she finds helpful, and she has previously used prednisone  for wheezing with good effect. No sore throat or ear pain. She confirms wheezing and a productive cough. No back pain or burning with urination.         Social history:  Relevant past medical, surgical, family and social history  reviewed and updated as indicated. Interim medical history since our last visit reviewed.  Allergies and medications reviewed and updated.  DATA REVIEWED: CHART IN EPIC     ROS: Negative unless specifically indicated above in HPI.    Current Outpatient Medications:    albuterol  (VENTOLIN  HFA) 108 (90 Base) MCG/ACT inhaler, Inhale 2 puffs into the lungs every 6 (six) hours as needed for wheezing or shortness of breath., Disp: 3 each, Rfl: 3   amLODipine  (NORVASC ) 5 MG tablet, Take 1 tablet (5 mg total) by mouth 2 (two) times daily., Disp: 180 tablet, Rfl: 3   amoxicillin -clavulanate (AUGMENTIN ) 875-125 MG tablet, Take 1 tablet by mouth 2 (two) times daily., Disp: 20 tablet, Rfl: 0   aspirin  EC 81 MG tablet, Take 1 tablet (81 mg total) by mouth daily., Disp: 90 tablet, Rfl: 3   benzonatate  (TESSALON ) 200 MG capsule, Take 1 capsule (200 mg total) by mouth 2 (two) times daily as needed for cough., Disp: 20 capsule, Rfl: 0   budesonide -formoterol  (SYMBICORT ) 160-4.5 MCG/ACT inhaler, Inhale 2 puffs into the lungs in the morning and at bedtime., Disp: 1 each, Rfl: 12   cholecalciferol (VITAMIN D) 1000 UNITS tablet, Take 1,000 Units by mouth daily., Disp: , Rfl:    clotrimazole -betamethasone  (LOTRISONE ) cream, APPLY TOPICALLY TO THE AFFECTED AREA TWICE DAILY, Disp: 30 g, Rfl: 2   DULoxetine  (CYMBALTA ) 30 MG capsule, Take 1 capsule (30 mg total) by mouth daily., Disp: 90 capsule, Rfl: 3   famotidine  (PEPCID ) 20 MG  tablet, Take 1 tablet (20 mg total) by mouth 2 (two) times daily., Disp: 180 tablet, Rfl: 3   ferrous sulfate 325 (65 FE) MG tablet, Take 325 mg by mouth daily with breakfast., Disp: , Rfl:    fluticasone  (FLONASE ) 50 MCG/ACT nasal spray, Place 2 sprays into both nostrils daily., Disp: 16 g, Rfl: 6   levothyroxine  (SYNTHROID ) 75 MCG tablet, Take 1 tablet (75 mcg total) by mouth daily before breakfast., Disp: 90 tablet, Rfl: 3   linaclotide  (LINZESS ) 145 MCG CAPS capsule, Take 1 capsule  (145 mcg total) by mouth daily as needed (for constipation)., Disp: 90 capsule, Rfl: 1   loratadine  (CLARITIN ) 10 MG tablet, Take 1 tablet (10 mg total) by mouth daily., Disp: 90 tablet, Rfl: 3   methocarbamol  (ROBAXIN -750) 750 MG tablet, Take 1 tablet (750 mg total) by mouth every 12 (twelve) hours as needed for muscle spasms., Disp: 60 tablet, Rfl: 3   olmesartan  (BENICAR ) 40 MG tablet, TAKE 1 TABLET(40 MG) BY MOUTH DAILY, Disp: 90 tablet, Rfl: 3   pantoprazole  (PROTONIX ) 40 MG tablet, Take 1 tablet (40 mg total) by mouth daily., Disp: 90 tablet, Rfl: 3   predniSONE  (DELTASONE ) 20 MG tablet, Take two tablets once daily for five days, Disp: 10 tablet, Rfl: 0   Spacer/Aero-Holding Chambers (AEROCHAMBER HOLDING CHAMBER) DEVI, 1 each by Does not apply route daily., Disp: 1 each, Rfl: 0   spironolactone  (ALDACTONE ) 25 MG tablet, Take 1 tablet (25 mg total) by mouth daily. For blood pressure., Disp: 90 tablet, Rfl: 3   vitamin B-12 (CYANOCOBALAMIN ) 1000 MCG tablet, Take 500 mcg by mouth daily., Disp: , Rfl:         Objective:        BP 122/66 (BP Location: Left Arm, Patient Position: Sitting, Cuff Size: Normal)   Pulse (!) 53   Temp 98 F (36.7 C) (Temporal)   Ht 5' 5.5 (1.664 m)   Wt 142 lb (64.4 kg)   SpO2 98%   BMI 23.27 kg/m   Physical Exam CHEST: Expiratory wheeze present.  Wt Readings from Last 3 Encounters:  06/11/24 142 lb (64.4 kg)  04/15/24 143 lb (64.9 kg)  04/05/24 143 lb (64.9 kg)    Physical Exam Constitutional:      General: She is not in acute distress.    Appearance: Normal appearance. She is normal weight. She is not ill-appearing, toxic-appearing or diaphoretic.  HENT:     Head: Normocephalic.     Right Ear: Tympanic membrane normal.     Left Ear: Tympanic membrane normal.     Nose: Nose normal.     Mouth/Throat:     Mouth: Mucous membranes are dry.     Pharynx: No oropharyngeal exudate or posterior oropharyngeal erythema.  Eyes:     Extraocular  Movements: Extraocular movements intact.     Pupils: Pupils are equal, round, and reactive to light.  Cardiovascular:     Rate and Rhythm: Normal rate and regular rhythm.     Pulses: Normal pulses.     Heart sounds: Normal heart sounds.  Pulmonary:     Effort: Pulmonary effort is normal.     Breath sounds: Examination of the right-upper field reveals wheezing. Wheezing present.     Comments: Expiratory fleeting wheeze right upper lobe Abdominal:     General: Abdomen is flat.     Tenderness: There is no abdominal tenderness. There is no right CVA tenderness or left CVA tenderness.  Musculoskeletal:     Cervical  back: Normal range of motion.  Neurological:     General: No focal deficit present.     Mental Status: She is alert and oriented to person, place, and time. Mental status is at baseline.     Motor: No weakness.  Psychiatric:        Mood and Affect: Mood normal.        Behavior: Behavior normal.        Thought Content: Thought content normal.        Judgment: Judgment normal.          Results   Assessment & Plan:   Assessment and Plan Assessment & Plan Acute bronchitis with wheezing in a patient with asthma Acute bronchitis with wheezing, likely exacerbated by recent cold. Wheezing improving with inhaler use. Productive cough. Asthma present. Prednisone  considered for recovery before travel. Benzonatate  effective for cough. Augmentin  chosen over doxycycline  to cover bronchitis and potential UTI. - Prescribe prednisone  to aid recovery from wheezing. - Prescribe benzonatate  for cough management. - Prescribe Augmentin  to cover both bronchitis and potential UTI. - Send prescriptions to [facility].  Urinary tract infection Urinary tract infection suspected due to strong urine odor. No frequency, urgency, dysuria, back pain, or burning sensation. Augmentin  chosen to cover potential UTI along with bronchitis. - Prescribe Augmentin  to cover potential UTI. - Instruct to  leave a urine sample for culture.  Recording duration: 9 minutes      Return if symptoms worsen or fail to improve.     Ginger Patrick, MSN, APRN, FNP-C [facility]                                                                "

## 2024-06-14 LAB — URINALYSIS W MICROSCOPIC + REFLEX CULTURE
Bilirubin Urine: NEGATIVE
Glucose, UA: NEGATIVE
Hyaline Cast: NONE SEEN /LPF
Nitrites, Initial: POSITIVE — AB
Specific Gravity, Urine: 1.025 (ref 1.001–1.035)
pH: 5.5 (ref 5.0–8.0)

## 2024-06-14 LAB — URINE CULTURE
MICRO NUMBER:: 17007135
SPECIMEN QUALITY:: ADEQUATE

## 2024-06-14 LAB — CULTURE INDICATED

## 2024-06-16 ENCOUNTER — Encounter: Payer: Self-pay | Admitting: Family

## 2024-06-25 ENCOUNTER — Ambulatory Visit (INDEPENDENT_AMBULATORY_CARE_PROVIDER_SITE_OTHER)

## 2024-06-25 ENCOUNTER — Encounter: Admitting: Family

## 2024-06-25 VITALS — BP 122/66 | Ht 65.5 in | Wt 142.0 lb

## 2024-06-25 DIAGNOSIS — Z0000 Encounter for general adult medical examination without abnormal findings: Secondary | ICD-10-CM

## 2024-06-25 NOTE — Patient Instructions (Signed)
 Heather Cobb,  Thank you for taking the time for your Medicare Wellness Visit. I appreciate your continued commitment to your health goals. Please review the care plan we discussed, and feel free to reach out if I can assist you further.  Medicare recommends these wellness visits once per year to help you and your care team stay ahead of potential health issues. These visits are designed to focus on prevention, allowing your provider to concentrate on managing your acute and chronic conditions during your regular appointments.  Please note that Annual Wellness Visits do not include a physical exam. Some assessments may be limited, especially if the visit was conducted virtually. If needed, we may recommend a separate in-person follow-up with your provider.  Ongoing Care Seeing your primary care provider every 3 to 6 months helps us  monitor your health and provide consistent, personalized care.   Referrals If a referral was made during today's visit and you haven't received any updates within two weeks, please contact the referred provider directly to check on the status.  Recommended Screenings:  Health Maintenance  Topic Date Due   DTaP/Tdap/Td vaccine (1 - Tdap) Never done   Zoster (Shingles) Vaccine (1 of 2) 11/26/1959   Medicare Annual Wellness Visit  03/05/2024   COVID-19 Vaccine (5 - 2025-26 season) 05/20/2024   Flu Shot  12/17/2024*   Pneumococcal Vaccine for age over 45  Completed   DEXA scan (bone density measurement)  Completed   Meningitis B Vaccine  Aged Out  *Topic was postponed. The date shown is not the original due date.       06/25/2024    8:37 AM  Advanced Directives  Does Patient Have a Medical Advance Directive? Yes  Type of Estate agent of [place];Living will  Does patient want to make changes to medical advance directive? No - Patient declined  Copy of Healthcare Power of Attorney in Chart? No - copy requested   Advance Care Planning is  important because it: Ensures you receive medical care that aligns with your values, goals, and preferences. Provides guidance to your family and loved ones, reducing the emotional burden of decision-making during critical moments.  Vision: Annual vision screenings are recommended for early detection of glaucoma, cataracts, and diabetic retinopathy. These exams can also reveal signs of chronic conditions such as diabetes and high blood pressure.  Dental: Annual dental screenings help detect early signs of oral cancer, gum disease, and other conditions linked to overall health, including heart disease and diabetes.  Please see the attached documents for additional preventive care recommendations.

## 2024-06-25 NOTE — Progress Notes (Signed)
 Because this visit was a virtual/telehealth visit,  certain criteria was not obtained, such a blood pressure, CBG if applicable, and timed get up and go. Any medications not marked as taking were not mentioned during the medication reconciliation part of the visit. Any vitals not documented were not able to be obtained due to this being a telehealth visit or patient was unable to self-report a recent blood pressure reading due to a lack of equipment at home via telehealth. Vitals that have been documented are verbally provided by the patient.  This visit was performed by a medical professional under my direct supervision. I was immediately available for consultation/collaboration. I have reviewed and agree with the Annual Wellness Visit documentation.  Subjective:   Heather Cobb is a 83 y.o. who presents for a Medicare Wellness preventive visit.  As a reminder, Annual Wellness Visits don't include a physical exam, and some assessments may be limited, especially if this visit is performed virtually. We may recommend an in-person follow-up visit with your provider if needed.  Visit Complete: Virtual I connected with  Heather Cobb on 06/25/24 by a audio enabled telemedicine application and verified that I am speaking with the correct person using two identifiers.  Patient Location: Home  Provider Location: Home Office  I discussed the limitations of evaluation and management by telemedicine. The patient expressed understanding and agreed to proceed.  Vital Signs: Because this visit was a virtual/telehealth visit, some criteria may be missing or patient reported. Any vitals not documented were not able to be obtained and vitals that have been documented are patient reported.  VideoDeclined- This patient declined Librarian, academic. Therefore the visit was completed with audio only.  Persons Participating in Visit: Patient.  AWV Questionnaire: No: Patient Medicare  AWV questionnaire was not completed prior to this visit.  Cardiac Risk Factors include: advanced age (>55men, >89 women);hypertension     Objective:    Today's Vitals   06/25/24 0837  BP: 122/66  Weight: 142 lb (64.4 kg)  Height: 5' 5.5 (1.664 m)   Body mass index is 23.27 kg/m.     06/25/2024    8:37 AM 04/15/2024   11:14 AM 04/11/2023   11:50 AM 03/06/2023   10:58 AM 05/26/2022    9:30 AM 05/16/2022    9:17 AM 03/01/2022    1:49 PM  Advanced Directives  Does Patient Have a Medical Advance Directive? Yes No Yes Yes No No No  Type of Estate agent of [place];Living will  Healthcare Power of [place];Living will Healthcare Power of [place];Living will     Does patient want to make changes to medical advance directive? No - Patient declined  No - Patient declined      Copy of Healthcare Power of Attorney in Chart? No - copy requested  No - copy requested No - copy requested     Would patient like information on creating a medical advance directive?  No - Patient declined No - Patient declined  No - Patient declined No - Patient declined No - Patient declined    Current Medications (verified) Outpatient Encounter Medications as of 06/25/2024  Medication Sig   albuterol  (VENTOLIN  HFA) 108 (90 Base) MCG/ACT inhaler Inhale 2 puffs into the lungs every 6 (six) hours as needed for wheezing or shortness of breath.   amLODipine  (NORVASC ) 5 MG tablet Take 1 tablet (5 mg total) by mouth 2 (two) times daily.   amoxicillin -clavulanate (AUGMENTIN ) 875-125 MG tablet Take 1  tablet by mouth 2 (two) times daily.   aspirin  EC 81 MG tablet Take 1 tablet (81 mg total) by mouth daily.   benzonatate  (TESSALON ) 200 MG capsule Take 1 capsule (200 mg total) by mouth 2 (two) times daily as needed for cough.   budesonide -formoterol  (SYMBICORT ) 160-4.5 MCG/ACT inhaler Inhale 2 puffs into the lungs in the morning and at bedtime.   cholecalciferol (VITAMIN D) 1000 UNITS tablet Take 1,000  Units by mouth daily.   clotrimazole -betamethasone  (LOTRISONE ) cream APPLY TOPICALLY TO THE AFFECTED AREA TWICE DAILY   DULoxetine  (CYMBALTA ) 30 MG capsule Take 1 capsule (30 mg total) by mouth daily.   famotidine  (PEPCID ) 20 MG tablet Take 1 tablet (20 mg total) by mouth 2 (two) times daily.   ferrous sulfate 325 (65 FE) MG tablet Take 325 mg by mouth daily with breakfast.   fluticasone  (FLONASE ) 50 MCG/ACT nasal spray Place 2 sprays into both nostrils daily.   levothyroxine  (SYNTHROID ) 75 MCG tablet Take 1 tablet (75 mcg total) by mouth daily before breakfast.   linaclotide  (LINZESS ) 145 MCG CAPS capsule Take 1 capsule (145 mcg total) by mouth daily as needed (for constipation).   loratadine  (CLARITIN ) 10 MG tablet Take 1 tablet (10 mg total) by mouth daily.   methocarbamol  (ROBAXIN -750) 750 MG tablet Take 1 tablet (750 mg total) by mouth every 12 (twelve) hours as needed for muscle spasms.   olmesartan  (BENICAR ) 40 MG tablet TAKE 1 TABLET(40 MG) BY MOUTH DAILY   pantoprazole  (PROTONIX ) 40 MG tablet Take 1 tablet (40 mg total) by mouth daily.   predniSONE  (DELTASONE ) 20 MG tablet Take two tablets once daily for five days   Spacer/Aero-Holding Chambers (AEROCHAMBER HOLDING CHAMBER) DEVI 1 each by Does not apply route daily.   spironolactone  (ALDACTONE ) 25 MG tablet Take 1 tablet (25 mg total) by mouth daily. For blood pressure.   vitamin B-12 (CYANOCOBALAMIN ) 1000 MCG tablet Take 500 mcg by mouth daily.   No facility-administered encounter medications on file as of 06/25/2024.    Allergies (verified) Codeine   History: Past Medical History:  Diagnosis Date   Anemia    Carotid body tumor (HCC)    left   Family history of colon cancer    Hypertension    Hypothyroid    Pyelonephritis    Rectal bleeding 11/26/2015   Vertigo    Vitamin B 12 deficiency    Past Surgical History:  Procedure Laterality Date   ABDOMINAL HYSTERECTOMY     CATARACT EXTRACTION W/ INTRAOCULAR LENS  IMPLANT,  BILATERAL     CHOLECYSTECTOMY N/A 04/15/2015   Procedure: LAPAROSCOPIC CHOLECYSTECTOMY;  Surgeon: Oneil Budge, MD;  Location: [facility];  Service: General;  Laterality: N/A;   COLONOSCOPY  2012   [place]   COLONOSCOPY WITH PROPOFOL  N/A 02/03/2016   Procedure: COLONOSCOPY WITH PROPOFOL ;  Surgeon: Reyes LELON Cota, MD;  Location: [facility];  Service: Endoscopy;  Laterality: N/A;   EYE SURGERY Bilateral    cataract extractions   HEMORRHOID SURGERY N/A 08/31/2018   Procedure: HEMORRHOIDECTOMY;  Surgeon: Cota Reyes LELON, MD;  Location: [facility];  Service: General;  Laterality: N/A;   IR ANGIO EXTERNAL CAROTID SEL EXT CAROTID UNI L MOD SED  10/26/2017   IR ANGIO INTRA EXTRACRAN SEL COM CAROTID INNOMINATE BILAT MOD SED  10/26/2017   IR ANGIO VERTEBRAL SEL VERTEBRAL UNI R MOD SED  10/26/2017   IR ANGIOGRAM EXTREMITY LEFT  10/26/2017   IR ANGIOGRAM FOLLOW UP STUDY  10/26/2017   IR NEURO EACH  ADD'L AFTER BASIC UNI LEFT (MS)  10/26/2017   IR RADIOLOGIST EVAL & MGMT  09/21/2017   IR TRANSCATH/EMBOLIZ  10/26/2017   MASS EXCISION Left 10/27/2017   Procedure: EXCISION CAROTID BODY TUMOR;  Surgeon: Carlie Clark, MD;  Location: [facility] OR;  Service: ENT;  Laterality: Left;   RADIOLOGY WITH ANESTHESIA N/A 10/26/2017   Procedure: EMBOLIZATION;  Surgeon: Dolphus Carrion, MD;  Location: [facility] OR;  Service: Radiology;  Laterality: N/A;   SIGMOIDOSCOPY N/A 08/31/2018   Procedure: SIGMOIDOSCOPY-IN  OR;  Surgeon: Dessa Reyes ORN, MD;  Location: [facility];  Service: General;  Laterality: N/A;   UPPER GASTROINTESTINAL ENDOSCOPY  2014   VESICOVAGINAL FISTULA CLOSURE W/ TAH     Family History  Problem Relation Age of Onset   Asthma Mother    Kidney failure Mother    Hypertension Daughter    Colon cancer Father        dx in his 34s   Stomach cancer Sister        stage 4   Colon cancer Maternal Uncle    Heart attack Maternal Grandmother    Asthma Brother    Cancer Neg Hx    Social History   Socioeconomic History    Marital status: Widowed    Spouse name: Not on file   Number of children: 1   Years of education: high school   Highest education level: 12th grade  Occupational History   Occupation: Scientist, product/process development    Comment: retired  Tobacco Use   Smoking status: Never   Smokeless tobacco: Never  Vaping Use   Vaping status: Never Used  Substance and Sexual Activity   Alcohol use: No    Alcohol/week: 0.0 standard drinks of alcohol   Drug use: No   Sexual activity: Not Currently    Birth control/protection: Surgical  Other Topics Concern   Not on file  Social History Narrative   10/15/19   From: the area   Living: with Daugher Beacher) and son-in-law   Work: retired from [employer] work, 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      Family: lives with daughter, has 1 grandson and 3 great-grandchildren and 1 great-great-grandchild      Enjoys: eat, dance, travel, cooking      Exercise: walk - not as much in cold, will do 1 mile/day   Diet: good, tries to eat health      Safety   Seat belts: Yes    Guns: Yes  and secure   Safe in relationships: Yes    Social Drivers of Health   Financial Resource Strain: Low Risk  (06/25/2024)   Overall Financial Resource Strain (CARDIA)    Difficulty of Paying Living Expenses: Not hard at all  Food Insecurity: No Food Insecurity (06/25/2024)   Hunger Vital Sign    Worried About Running Out of Food in the Last Year: Never true    Ran Out of Food in the Last Year: Never true  Transportation Needs: No Transportation Needs (06/25/2024)   PRAPARE - Administrator, Civil Service (Medical): No    Lack of Transportation (Non-Medical): No  Physical Activity: Insufficiently Active (06/25/2024)   Exercise Vital Sign    Days of Exercise per Week: 2 days    Minutes of Exercise per Session: 30 min  Stress: No Stress Concern Present (06/25/2024)   [employer] of Occupational Health - Occupational Stress Questionnaire    Feeling of Stress: Not at all  Social  Connections:  Moderately Integrated (06/25/2024)   Social Connection and Isolation Panel    Frequency of Communication with Friends and Family: More than three times a week    Frequency of Social Gatherings with Friends and Family: Three times a week    Attends Religious Services: More than 4 times per year    Active Member of Clubs or Organizations: Yes    Attends Banker Meetings: More than 4 times per year    Marital Status: Widowed    Tobacco Counseling Counseling given: Not Answered    Clinical Intake:  Pre-visit preparation completed: Yes  Pain : No/denies pain     BMI - recorded: 23.27 Nutritional Status: BMI of 19-24  Normal Nutritional Risks: None Diabetes: No  Lab Results  Component Value Date   HGBA1C 6.3 08/16/2023   HGBA1C 5.8 (A) 04/09/2020     How often do you need to have someone help you when you read instructions, pamphlets, or other written materials from your doctor or pharmacy?: 1 - Never  Interpreter Needed?: No  Information entered by :: Aleana Fifita,C   Activities of Daily Living     06/25/2024    8:39 AM  In your present state of health, do you have any difficulty performing the following activities:  Hearing? 0  Vision? 0  Difficulty concentrating or making decisions? 0  Walking or climbing stairs? 0  Dressing or bathing? 0  Doing errands, shopping? 0  Preparing Food and eating ? N  Using the Toilet? N  In the past six months, have you accidently leaked urine? N  Do you have problems with loss of bowel control? N  Managing your Medications? N  Managing your Finances? N  Housekeeping or managing your Housekeeping? N    Patient Care Team: Corwin Antu, FNP as PCP - General (Family Medicine) Deveshwar, Sanjeev, MD as Consulting Physician (Interventional Radiology)  I have updated your Care Teams any recent Medical Services you may have received from other providers in the past year.     Assessment:   This is a  routine wellness examination for Alleen.  Hearing/Vision screen Hearing Screening - Comments:: No difficulties  Vision Screening - Comments:: Wears glasses    Goals Addressed               This Visit's Progress     Patient stated (pt-stated)   On track     I would like to love travel more to see my great grandchildren        Depression Screen     06/25/2024    8:40 AM 04/15/2024   11:12 AM 04/05/2024    2:22 PM 10/31/2023    9:03 AM 08/16/2023    9:20 AM 03/06/2023   10:57 AM 01/16/2023   12:29 PM  PHQ 2/9 Scores  PHQ - 2 Score 0 0 0 0 0 0 0  PHQ- 9 Score 0 0 0 0 1 0 0    Fall Risk     06/25/2024    8:39 AM 04/05/2024    2:22 PM 10/31/2023    9:03 AM 08/16/2023    9:20 AM 03/06/2023   10:59 AM  Fall Risk   Falls in the past year? 0 0 0 0 0  Number falls in past yr: 0 0 0 0 0  Injury with Fall? 0 0 0 0 0  Risk for fall due to : No Fall Risks No Fall Risks No Fall Risks No Fall Risks No Fall Risks  Follow up Falls evaluation completed Falls evaluation completed Falls evaluation completed Falls evaluation completed Falls prevention discussed;Falls evaluation completed    MEDICARE RISK AT HOME:  Medicare Risk at Home Any stairs in or around the home?: Yes If so, are there any without handrails?: No Home free of loose throw rugs in walkways, pet beds, electrical cords, etc?: Yes Adequate lighting in your home to reduce risk of falls?: Yes Life alert?: No Use of a cane, walker or w/c?: No Grab bars in the bathroom?: Yes Shower chair or bench in shower?: Yes Elevated toilet seat or a handicapped toilet?: Yes  TIMED UP AND GO:  Was the test performed?  No  Cognitive Function: 6CIT completed        06/25/2024    8:40 AM 03/06/2023   11:00 AM 03/01/2022    1:49 PM  6CIT Screen  What Year? 0 points 0 points 0 points  What month? 0 points 0 points 0 points  What time? 0 points 0 points 0 points  Count back from 20 0 points 0 points 0 points  Months in reverse 0  points 0 points 0 points  Repeat phrase 0 points 0 points 0 points  Total Score 0 points 0 points 0 points    Immunizations Immunization History  Administered Date(s) Administered   Fluad Quad(high Dose 65+) 06/04/2020   INFLUENZA, HIGH DOSE SEASONAL PF 06/19/2019   Influenza-Unspecified 07/02/2021, 06/08/2022, 05/24/2023   Moderna Covid-19 Vaccine Bivalent Booster 39yrs & up 05/24/2023   PFIZER(Purple Top)SARS-COV-2 Vaccination 10/29/2019, 11/19/2019, 07/06/2020   PNEUMOCOCCAL CONJUGATE-20 08/16/2023   Pneumococcal Polysaccharide-23 06/22/2020   Respiratory Syncytial Virus Vaccine,Recomb Aduvanted(Arexvy) 06/08/2022   Zoster, Live 06/30/2022, 10/04/2022    Screening Tests Health Maintenance  Topic Date Due   DTaP/Tdap/Td (1 - Tdap) Never done   Zoster Vaccines- Shingrix  (1 of 2) 11/26/1959   COVID-19 Vaccine (5 - 2025-26 season) 05/20/2024   Influenza Vaccine  12/17/2024 (Originally 04/19/2024)   Medicare Annual Wellness (AWV)  06/25/2025   Pneumococcal Vaccine: 50+ Years  Completed   DEXA SCAN  Completed   Meningococcal B Vaccine  Aged Out    Health Maintenance Items Addressed:patient declined   Additional Screening:  Vision Screening: Recommended annual ophthalmology exams for early detection of glaucoma and other disorders of the eye. Is the patient up to date with their annual eye exam?  Yes    Dental Screening: Recommended annual dental exams for proper oral hygiene  Community Resource Referral / Chronic Care Management: CRR required this visit?  No   CCM required this visit?  No   Plan:    I have personally reviewed and noted the following in the patient's chart:   Medical and social history Use of alcohol, tobacco or illicit drugs  Current medications and supplements including opioid prescriptions. Patient is not currently taking opioid prescriptions. Functional ability and status Nutritional status Physical activity Advanced directives List of other  physicians Hospitalizations, surgeries, and ER visits in previous 12 months Vitals Screenings to include cognitive, depression, and falls Referrals and appointments  In addition, I have reviewed and discussed with patient certain preventive protocols, quality metrics, and best practice recommendations. A written personalized care plan for preventive services as well as general preventive health recommendations were provided to patient.   Lyle MARLA Right, [place]   06/25/2024   After Visit Summary: (MyChart) Due to this being a telephonic visit, the after visit summary with patients personalized plan was offered to patient via MyChart   Notes:  Nothing significant to report at this time.

## 2024-07-15 ENCOUNTER — Encounter: Payer: Self-pay | Admitting: Pulmonary Disease

## 2024-07-15 DIAGNOSIS — R053 Chronic cough: Secondary | ICD-10-CM

## 2024-07-17 MED ORDER — BENZONATATE 200 MG PO CAPS: 200.0000 mg | ORAL_CAPSULE | Freq: Three times a day (TID) | ORAL | 0 refills | Status: DC | PRN

## 2024-07-31 DIAGNOSIS — Z809 Family history of malignant neoplasm, unspecified: Secondary | ICD-10-CM | POA: Diagnosis not present

## 2024-07-31 DIAGNOSIS — K219 Gastro-esophageal reflux disease without esophagitis: Secondary | ICD-10-CM | POA: Diagnosis not present

## 2024-07-31 DIAGNOSIS — Z8249 Family history of ischemic heart disease and other diseases of the circulatory system: Secondary | ICD-10-CM | POA: Diagnosis not present

## 2024-07-31 DIAGNOSIS — E039 Hypothyroidism, unspecified: Secondary | ICD-10-CM | POA: Diagnosis not present

## 2024-07-31 DIAGNOSIS — I2510 Atherosclerotic heart disease of native coronary artery without angina pectoris: Secondary | ICD-10-CM | POA: Diagnosis not present

## 2024-07-31 DIAGNOSIS — I10 Essential (primary) hypertension: Secondary | ICD-10-CM | POA: Diagnosis not present

## 2024-07-31 DIAGNOSIS — R32 Unspecified urinary incontinence: Secondary | ICD-10-CM | POA: Diagnosis not present

## 2024-07-31 DIAGNOSIS — K5900 Constipation, unspecified: Secondary | ICD-10-CM | POA: Diagnosis not present

## 2024-07-31 DIAGNOSIS — J4489 Other specified chronic obstructive pulmonary disease: Secondary | ICD-10-CM | POA: Diagnosis not present

## 2024-08-14 ENCOUNTER — Encounter: Payer: Self-pay | Admitting: Pulmonary Disease

## 2024-08-14 ENCOUNTER — Encounter: Payer: Self-pay | Admitting: Family

## 2024-08-14 DIAGNOSIS — I10 Essential (primary) hypertension: Secondary | ICD-10-CM

## 2024-08-14 DIAGNOSIS — R053 Chronic cough: Secondary | ICD-10-CM

## 2024-08-14 MED ORDER — AMLODIPINE BESYLATE 5 MG PO TABS: 5.0000 mg | ORAL_TABLET | Freq: Two times a day (BID) | ORAL | 3 refills | Status: AC

## 2024-08-14 MED ORDER — BENZONATATE 200 MG PO CAPS: 200.0000 mg | ORAL_CAPSULE | Freq: Three times a day (TID) | ORAL | 0 refills | Status: DC | PRN

## 2024-08-14 MED ORDER — OLMESARTAN MEDOXOMIL 40 MG PO TABS: ORAL_TABLET | ORAL | 3 refills | Status: AC

## 2024-09-17 ENCOUNTER — Ambulatory Visit (INDEPENDENT_AMBULATORY_CARE_PROVIDER_SITE_OTHER): Admitting: Family Medicine

## 2024-09-17 ENCOUNTER — Ambulatory Visit: Payer: Self-pay | Admitting: Family

## 2024-09-17 VITALS — BP 114/60 | HR 55 | Temp 97.7°F | Ht 65.5 in | Wt 140.2 lb

## 2024-09-17 DIAGNOSIS — J22 Unspecified acute lower respiratory infection: Secondary | ICD-10-CM

## 2024-09-17 DIAGNOSIS — J101 Influenza due to other identified influenza virus with other respiratory manifestations: Secondary | ICD-10-CM | POA: Diagnosis not present

## 2024-09-17 DIAGNOSIS — R8290 Unspecified abnormal findings in urine: Secondary | ICD-10-CM | POA: Diagnosis not present

## 2024-09-17 DIAGNOSIS — R6883 Chills (without fever): Secondary | ICD-10-CM

## 2024-09-17 DIAGNOSIS — R051 Acute cough: Secondary | ICD-10-CM | POA: Diagnosis not present

## 2024-09-17 DIAGNOSIS — K5900 Constipation, unspecified: Secondary | ICD-10-CM | POA: Diagnosis not present

## 2024-09-17 DIAGNOSIS — R52 Pain, unspecified: Secondary | ICD-10-CM

## 2024-09-17 LAB — POC COVID19 BINAXNOW: SARS Coronavirus 2 Ag: NEGATIVE

## 2024-09-17 LAB — POC URINALSYSI DIPSTICK (AUTOMATED)
Bilirubin, UA: NEGATIVE
Blood, UA: NEGATIVE
Glucose, UA: NEGATIVE
Ketones, UA: NEGATIVE
Leukocytes, UA: NEGATIVE
Nitrite, UA: NEGATIVE
Protein, UA: POSITIVE — AB
Spec Grav, UA: 1.02
Urobilinogen, UA: 2 U/dL — AB
pH, UA: 6

## 2024-09-17 LAB — POCT INFLUENZA A/B
Influenza A, POC: POSITIVE — AB
Influenza B, POC: NEGATIVE

## 2024-09-17 MED ORDER — LINACLOTIDE 145 MCG PO CAPS: 145.0000 ug | ORAL_CAPSULE | Freq: Every day | ORAL | 0 refills | Status: DC | PRN

## 2024-09-17 MED ORDER — BENZONATATE 200 MG PO CAPS: 200.0000 mg | ORAL_CAPSULE | Freq: Three times a day (TID) | ORAL | 0 refills | Status: AC | PRN

## 2024-09-17 MED ORDER — LINACLOTIDE 145 MCG PO CAPS: 145.0000 ug | ORAL_CAPSULE | Freq: Every day | ORAL | 0 refills | Status: AC | PRN

## 2024-09-17 MED ORDER — OSELTAMIVIR PHOSPHATE 75 MG PO CAPS: 75.0000 mg | ORAL_CAPSULE | Freq: Two times a day (BID) | ORAL | 0 refills | Status: AC

## 2024-09-17 NOTE — Assessment & Plan Note (Signed)
 UA without obvious UTI however microscopy suspicious - send UCx.

## 2024-09-17 NOTE — Assessment & Plan Note (Signed)
 Refilled today per pt request

## 2024-09-17 NOTE — Assessment & Plan Note (Addendum)
 Anticipate flu like illness. Check COVID and flu swabs today - positive flu A Outside of window for tamiflu however given age at increased risk of complications so reasonable to treat - tamiflu sent in Tessalon  perls refilled.  Supportive measures reviewed.  Update if not improving as expected. Red flags to seek further care reviewed.

## 2024-09-17 NOTE — Progress Notes (Signed)
 " Ph: [phone] Fax: [phone]   Patient ID: Heather Cobb, female    DOB: 06-07-1941, 83 y.o.   MRN: [id number]  This visit was conducted in person.  BP 114/60 (Cuff Size: Normal)   Pulse (!) 55   Temp 97.7 F (36.5 C) (Oral)   Ht 5' 5.5 (1.664 m)   Wt 140 lb 3.2 oz (63.6 kg)   SpO2 95%   BMI 22.98 kg/m    CC: urine symptoms, respiratory symptoms  Subjective:   HPI: Heather Cobb is a 83 y.o. female presenting on 09/17/2024 for Acute Visit (Urine odor, dark color, Onset 12/24 /Cough, fever?, chills, body aches, 12/26/)   5d h/o flu like symptoms - body aches, chills, loss of appetite, persistent nagging cough productive of clear mucous with coughing fits. Some nausea.  Treating at home with mucinex DM, night time robitussin and advil.  Daughter Graig also sick.  No abd pain, vomiting, or diarrhea, ear or tooth pain, ST, PNdrainage.   1 wk h/o UTI symptoms - predominantly foul urine odor and some nausea.  No dysuria, hematuria, urgency or frequency.  No abd pain, flank pain, fever.  Has not been drinking much water.  She has been drinking cranberry juice.      Relevant past medical, surgical, family and social history reviewed and updated as indicated. Interim medical history since our last visit reviewed. Allergies and medications reviewed and updated. Outpatient Medications Prior to Visit  Medication Sig Dispense Refill   albuterol  (VENTOLIN  HFA) 108 (90 Base) MCG/ACT inhaler Inhale 2 puffs into the lungs every 6 (six) hours as needed for wheezing or shortness of breath. 3 each 3   amLODipine  (NORVASC ) 5 MG tablet Take 1 tablet (5 mg total) by mouth 2 (two) times daily. 180 tablet 3   aspirin  EC 81 MG tablet Take 1 tablet (81 mg total) by mouth daily. 90 tablet 3   budesonide -formoterol  (SYMBICORT ) 160-4.5 MCG/ACT inhaler Inhale 2 puffs into the lungs in the morning and at bedtime. 1 each 12   cholecalciferol (VITAMIN D) 1000 UNITS tablet Take 1,000 Units by  mouth daily.     clotrimazole -betamethasone  (LOTRISONE ) cream APPLY TOPICALLY TO THE AFFECTED AREA TWICE DAILY 30 g 2   famotidine  (PEPCID ) 20 MG tablet Take 1 tablet (20 mg total) by mouth 2 (two) times daily. 180 tablet 3   ferrous sulfate 325 (65 FE) MG tablet Take 325 mg by mouth daily with breakfast.     fluticasone  (FLONASE ) 50 MCG/ACT nasal spray Place 2 sprays into both nostrils daily. (Patient taking differently: Place 2 sprays into both nostrils as needed.) 16 g 6   levothyroxine  (SYNTHROID ) 75 MCG tablet Take 1 tablet (75 mcg total) by mouth daily before breakfast. 90 tablet 3   loratadine  (CLARITIN ) 10 MG tablet Take 1 tablet (10 mg total) by mouth daily. 90 tablet 3   olmesartan  (BENICAR ) 40 MG tablet TAKE 1 TABLET(40 MG) BY MOUTH DAILY 90 tablet 3   pantoprazole  (PROTONIX ) 40 MG tablet Take 1 tablet (40 mg total) by mouth daily. 90 tablet 3   predniSONE  (DELTASONE ) 20 MG tablet Take two tablets once daily for five days (Patient taking differently: as needed. Take two tablets once daily for five days) 10 tablet 0   Spacer/Aero-Holding Chambers (AEROCHAMBER HOLDING CHAMBER) DEVI 1 each by Does not apply route daily. 1 each 0   spironolactone  (ALDACTONE ) 25 MG tablet Take 1 tablet (25 mg total) by mouth daily. For blood pressure. 90  tablet 3   vitamin B-12 (CYANOCOBALAMIN ) 1000 MCG tablet Take 500 mcg by mouth daily.     benzonatate  (TESSALON ) 200 MG capsule Take 1 capsule (200 mg total) by mouth 3 (three) times daily as needed for cough. 20 capsule 0   linaclotide  (LINZESS ) 145 MCG CAPS capsule Take 1 capsule (145 mcg total) by mouth daily as needed (for constipation). 90 capsule 1   DULoxetine  (CYMBALTA ) 30 MG capsule Take 1 capsule (30 mg total) by mouth daily. (Patient not taking: Reported on 09/17/2024) 90 capsule 3   methocarbamol  (ROBAXIN -750) 750 MG tablet Take 1 tablet (750 mg total) by mouth every 12 (twelve) hours as needed for muscle spasms. (Patient not taking: Reported on  09/17/2024) 60 tablet 3   amoxicillin -clavulanate (AUGMENTIN ) 875-125 MG tablet Take 1 tablet by mouth 2 (two) times daily. (Patient not taking: Reported on 09/17/2024) 20 tablet 0   No facility-administered medications prior to visit.     Per HPI unless specifically indicated in ROS section below Review of Systems  Objective:  BP 114/60 (Cuff Size: Normal)   Pulse (!) 55   Temp 97.7 F (36.5 C) (Oral)   Ht 5' 5.5 (1.664 m)   Wt 140 lb 3.2 oz (63.6 kg)   SpO2 95%   BMI 22.98 kg/m   Wt Readings from Last 3 Encounters:  09/17/24 140 lb 3.2 oz (63.6 kg)  06/25/24 142 lb (64.4 kg)  06/11/24 142 lb (64.4 kg)      Physical Exam Vitals and nursing note reviewed.  Constitutional:      Appearance: Normal appearance. She is not ill-appearing.  HENT:     Head: Normocephalic and atraumatic.     Right Ear: Tympanic membrane, ear canal and external ear normal. There is no impacted cerumen.     Left Ear: Tympanic membrane, ear canal and external ear normal. There is no impacted cerumen.     Nose: Nose normal.     Right Sinus: No maxillary sinus tenderness or frontal sinus tenderness.     Left Sinus: No maxillary sinus tenderness or frontal sinus tenderness.     Comments: Wearing mask    Mouth/Throat:     Mouth: Mucous membranes are moist.     Pharynx: Oropharynx is clear. No oropharyngeal exudate or posterior oropharyngeal erythema.  Eyes:     Extraocular Movements: Extraocular movements intact.     Conjunctiva/sclera: Conjunctivae normal.     Pupils: Pupils are equal, round, and reactive to light.  Cardiovascular:     Rate and Rhythm: Normal rate and regular rhythm.     Pulses: Normal pulses.     Heart sounds: Normal heart sounds. No murmur heard. Pulmonary:     Effort: Pulmonary effort is normal. No respiratory distress.     Breath sounds: Normal breath sounds. No wheezing, rhonchi or rales.  Abdominal:     General: Bowel sounds are normal. There is no distension.      Palpations: Abdomen is soft. There is no mass.     Tenderness: There is no abdominal tenderness. There is no right CVA tenderness, left CVA tenderness, guarding or rebound.     Hernia: No hernia is present.  Lymphadenopathy:     Head:     Right side of head: No submental, submandibular, tonsillar, preauricular or posterior auricular adenopathy.     Left side of head: No submental, submandibular, tonsillar, preauricular or posterior auricular adenopathy.     Cervical: No cervical adenopathy.     Right cervical: No  superficial cervical adenopathy.    Left cervical: No superficial cervical adenopathy.     Upper Body:     Right upper body: No supraclavicular adenopathy.     Left upper body: No supraclavicular adenopathy.  Skin:    Findings: No rash.  Neurological:     Mental Status: She is alert.  Psychiatric:        Mood and Affect: Mood normal.        Behavior: Behavior normal.       Results for orders placed or performed in visit on 09/17/24  POCT Urinalysis Dipstick (Automated)   Collection Time: 09/17/24  9:53 AM  Result Value Ref Range   Color, UA Amber    Clarity, UA Clear    Glucose, UA Negative Negative   Bilirubin, UA Negative    Ketones, UA Negative    Spec Grav, UA 1.020 1.010 - 1.025   Blood, UA Negative    pH, UA 6.0 5.0 - 8.0   Protein, UA Positive (A) Negative   Urobilinogen, UA 2.0 (A) 0.2 or 1.0 E.U./dL   Nitrite, UA Negative    Leukocytes, UA Negative Negative   Lab Results  Component Value Date   NA 140 04/03/2024   CL 102 04/03/2024   K 3.6 04/03/2024   CO2 27 04/03/2024   BUN 15 04/03/2024   CREATININE 1.17 (H) 04/03/2024   GFRNONAA 46 (L) 04/03/2024   CALCIUM 10.0 04/03/2024   ALBUMIN 3.8 04/03/2024   GLUCOSE 107 (H) 04/03/2024    Lab Results  Component Value Date   ALT 10 04/03/2024   AST 15 04/03/2024   ALKPHOS 73 04/03/2024   BILITOT 0.7 04/03/2024    Lab Results  Component Value Date   WBC 5.7 04/03/2024   HGB 11.9 (L) 04/03/2024    HCT 37.2 04/03/2024   MCV 90.5 04/03/2024   PLT 258 04/03/2024     Assessment & Plan:  She requests linzess  and tessalon  perls refilled  Problem List Items Addressed This Visit     Constipation   Refilled today per pt request       Relevant Medications   linaclotide  (LINZESS ) 145 MCG CAPS capsule   Abnormal urine odor   UA without obvious UTI however microscopy suspicious - send UCx.       Relevant Orders   POCT Urinalysis Dipstick (Automated) (Completed)   Urine Culture   Influenza A - Primary   Anticipate flu like illness. Check COVID and flu swabs today - positive flu A Outside of window for tamiflu however given age at increased risk of complications so reasonable to treat - tamiflu sent in Tessalon  perls refilled.  Supportive measures reviewed.  Update if not improving as expected. Red flags to seek further care reviewed.       Relevant Medications   oseltamivir (TAMIFLU) 75 MG capsule   Other Visit Diagnoses       Acute respiratory infection       Relevant Medications   oseltamivir (TAMIFLU) 75 MG capsule        Meds ordered this encounter  Medications   benzonatate  (TESSALON ) 200 MG capsule    Sig: Take 1 capsule (200 mg total) by mouth 3 (three) times daily as needed for cough.    Dispense:  30 capsule    Refill:  0   DISCONTD: linaclotide  (LINZESS ) 145 MCG CAPS capsule    Sig: Take 1 capsule (145 mcg total) by mouth daily as needed (for constipation).    Dispense:  90 capsule    Refill:  0   oseltamivir (TAMIFLU) 75 MG capsule    Sig: Take 1 capsule (75 mg total) by mouth 2 (two) times daily.    Dispense:  10 capsule    Refill:  0   linaclotide  (LINZESS ) 145 MCG CAPS capsule    Sig: Take 1 capsule (145 mcg total) by mouth daily as needed (for constipation).    Dispense:  90 capsule    Refill:  0    Orders Placed This Encounter  Procedures   Urine Culture   POCT Urinalysis Dipstick (Automated)    Patient Instructions  Urine tested  today overall ok - culture sent to ensure no infection. Tessalon  perls and linzess  refilled today  You have tested positive for influenza A.  Antibiotics are not needed for this.  May continue tessalon  perls as needed for cough Push fluids and plenty of rest. Please return if you are not improving as expected, or if you have high fevers (>101.5) or difficulty swallowing or worsening productive cough. Call clinic with questions.  Good to see you today. I hope you start feeling better soon.   Follow up plan: Return if symptoms worsen or fail to improve.  Anton Blas, MD   "

## 2024-09-17 NOTE — Patient Instructions (Addendum)
 Urine tested today overall ok - culture sent to ensure no infection. Tessalon  perls and linzess  refilled today  You have tested positive for influenza A.  Antibiotics are not needed for this.  May continue tessalon  perls as needed for cough Push fluids and plenty of rest. Please return if you are not improving as expected, or if you have high fevers (>101.5) or difficulty swallowing or worsening productive cough. Call clinic with questions.  Good to see you today. I hope you start feeling better soon.

## 2024-09-18 LAB — URINE CULTURE
MICRO NUMBER:: 17410036
SPECIMEN QUALITY:: ADEQUATE

## 2024-11-06 ENCOUNTER — Ambulatory Visit: Admitting: Pulmonary Disease

## 2024-11-13 ENCOUNTER — Encounter: Admitting: Family

## 2025-07-08 ENCOUNTER — Ambulatory Visit

## 2025-07-09 ENCOUNTER — Ambulatory Visit
